# Patient Record
Sex: Male | Born: 1960
Health system: Southern US, Community
[De-identification: ages and names within clinical notes are randomized; demographics above are authoritative.]

## PROBLEM LIST (undated history)

## (undated) ENCOUNTER — Ambulatory Visit: Admission: EM | Payer: Medicare HMO

## (undated) DIAGNOSIS — E1169 Type 2 diabetes mellitus with other specified complication: Secondary | ICD-10-CM

## (undated) DIAGNOSIS — E785 Hyperlipidemia, unspecified: Secondary | ICD-10-CM

## (undated) DIAGNOSIS — N183 Chronic kidney disease, stage 3 unspecified: Secondary | ICD-10-CM

## (undated) DIAGNOSIS — I272 Pulmonary hypertension, unspecified: Secondary | ICD-10-CM

## (undated) DIAGNOSIS — I350 Nonrheumatic aortic (valve) stenosis: Secondary | ICD-10-CM

## (undated) DIAGNOSIS — I10 Essential (primary) hypertension: Secondary | ICD-10-CM

## (undated) DIAGNOSIS — I5032 Chronic diastolic (congestive) heart failure: Secondary | ICD-10-CM

## (undated) DIAGNOSIS — H9190 Unspecified hearing loss, unspecified ear: Secondary | ICD-10-CM

## (undated) DIAGNOSIS — I517 Cardiomegaly: Secondary | ICD-10-CM

## (undated) DIAGNOSIS — I447 Left bundle-branch block, unspecified: Secondary | ICD-10-CM

## (undated) DIAGNOSIS — I509 Heart failure, unspecified: Secondary | ICD-10-CM

## (undated) DIAGNOSIS — G4730 Sleep apnea, unspecified: Secondary | ICD-10-CM

## (undated) DIAGNOSIS — R942 Abnormal results of pulmonary function studies: Secondary | ICD-10-CM

## (undated) DIAGNOSIS — IMO0001 Reserved for inherently not codable concepts without codable children: Secondary | ICD-10-CM

## (undated) DIAGNOSIS — E119 Type 2 diabetes mellitus without complications: Secondary | ICD-10-CM

## (undated) DIAGNOSIS — E669 Obesity, unspecified: Secondary | ICD-10-CM

## (undated) HISTORY — DX: Left bundle-branch block, unspecified: I44.7

## (undated) HISTORY — DX: Morbid (severe) obesity due to excess calories: E66.01

## (undated) HISTORY — DX: Heart failure, unspecified: I50.9

## (undated) HISTORY — DX: Type 2 diabetes mellitus without complications: E11.9

## (undated) HISTORY — DX: Type 2 diabetes mellitus with other specified complication: E66.9

## (undated) HISTORY — DX: Pulmonary hypertension, unspecified: I27.20

## (undated) HISTORY — DX: Hyperlipidemia, unspecified: E78.5

## (undated) HISTORY — PX: TRACHEOSTOMY: SUR1362

## (undated) HISTORY — DX: Nonrheumatic aortic (valve) stenosis: I35.0

## (undated) HISTORY — DX: Chronic kidney disease, stage 3 unspecified: N18.30

## (undated) HISTORY — DX: Type 2 diabetes mellitus with other specified complication: E11.69

## (undated) HISTORY — DX: Abnormal results of pulmonary function studies: R94.2

## (undated) HISTORY — DX: Cardiomegaly: I51.7

## (undated) HISTORY — PX: BACK SURGERY: SHX140

## (undated) HISTORY — DX: Chronic diastolic (congestive) heart failure: I50.32

---

## 1999-03-31 ENCOUNTER — Encounter (INDEPENDENT_AMBULATORY_CARE_PROVIDER_SITE_OTHER): Payer: Self-pay

## 1999-03-31 ENCOUNTER — Encounter: Payer: Self-pay | Admitting: Specialist

## 1999-04-01 ENCOUNTER — Inpatient Hospital Stay (HOSPITAL_COMMUNITY): Admission: EM | Admit: 1999-04-01 | Discharge: 1999-04-02 | Payer: Self-pay | Admitting: Specialist

## 2000-06-07 ENCOUNTER — Emergency Department (HOSPITAL_COMMUNITY): Admission: EM | Admit: 2000-06-07 | Discharge: 2000-06-07 | Payer: Self-pay | Admitting: Emergency Medicine

## 2000-06-07 ENCOUNTER — Encounter: Payer: Self-pay | Admitting: Emergency Medicine

## 2000-11-11 ENCOUNTER — Emergency Department (HOSPITAL_COMMUNITY): Admission: EM | Admit: 2000-11-11 | Discharge: 2000-11-11 | Payer: Self-pay | Admitting: Emergency Medicine

## 2000-11-11 ENCOUNTER — Encounter: Payer: Self-pay | Admitting: Emergency Medicine

## 2000-12-15 ENCOUNTER — Encounter (INDEPENDENT_AMBULATORY_CARE_PROVIDER_SITE_OTHER): Payer: Self-pay | Admitting: Specialist

## 2000-12-16 ENCOUNTER — Inpatient Hospital Stay (HOSPITAL_COMMUNITY): Admission: RE | Admit: 2000-12-16 | Discharge: 2000-12-17 | Payer: Self-pay | Admitting: Otolaryngology

## 2001-02-17 ENCOUNTER — Ambulatory Visit (HOSPITAL_BASED_OUTPATIENT_CLINIC_OR_DEPARTMENT_OTHER): Admission: RE | Admit: 2001-02-17 | Discharge: 2001-02-17 | Payer: Self-pay | Admitting: Otolaryngology

## 2001-12-12 ENCOUNTER — Emergency Department (HOSPITAL_COMMUNITY): Admission: EM | Admit: 2001-12-12 | Discharge: 2001-12-12 | Payer: Self-pay | Admitting: Emergency Medicine

## 2002-03-02 ENCOUNTER — Emergency Department (HOSPITAL_COMMUNITY): Admission: EM | Admit: 2002-03-02 | Discharge: 2002-03-02 | Payer: Self-pay | Admitting: *Deleted

## 2002-12-04 ENCOUNTER — Encounter: Admission: RE | Admit: 2002-12-04 | Discharge: 2002-12-04 | Payer: Self-pay | Admitting: Internal Medicine

## 2002-12-14 ENCOUNTER — Ambulatory Visit: Admission: RE | Admit: 2002-12-14 | Discharge: 2002-12-14 | Payer: Self-pay | Admitting: Internal Medicine

## 2002-12-18 ENCOUNTER — Encounter: Admission: RE | Admit: 2002-12-18 | Discharge: 2002-12-18 | Payer: Self-pay | Admitting: Internal Medicine

## 2003-01-01 ENCOUNTER — Encounter: Admission: RE | Admit: 2003-01-01 | Discharge: 2003-01-01 | Payer: Self-pay | Admitting: Internal Medicine

## 2003-01-15 ENCOUNTER — Encounter: Admission: RE | Admit: 2003-01-15 | Discharge: 2003-01-15 | Payer: Self-pay | Admitting: Internal Medicine

## 2003-01-22 ENCOUNTER — Encounter: Admission: RE | Admit: 2003-01-22 | Discharge: 2003-01-22 | Payer: Self-pay | Admitting: Internal Medicine

## 2003-01-24 ENCOUNTER — Ambulatory Visit (HOSPITAL_BASED_OUTPATIENT_CLINIC_OR_DEPARTMENT_OTHER): Admission: RE | Admit: 2003-01-24 | Discharge: 2003-01-24 | Payer: Self-pay | Admitting: Otolaryngology

## 2003-02-05 ENCOUNTER — Encounter: Admission: RE | Admit: 2003-02-05 | Discharge: 2003-02-05 | Payer: Self-pay | Admitting: Internal Medicine

## 2003-02-06 ENCOUNTER — Ambulatory Visit (HOSPITAL_COMMUNITY): Admission: RE | Admit: 2003-02-06 | Discharge: 2003-02-06 | Payer: Self-pay | Admitting: Otolaryngology

## 2003-02-06 ENCOUNTER — Encounter: Payer: Self-pay | Admitting: Otolaryngology

## 2003-02-14 ENCOUNTER — Inpatient Hospital Stay (HOSPITAL_COMMUNITY): Admission: RE | Admit: 2003-02-14 | Discharge: 2003-02-23 | Payer: Self-pay | Admitting: Otolaryngology

## 2003-02-15 ENCOUNTER — Encounter: Payer: Self-pay | Admitting: Otolaryngology

## 2003-02-18 ENCOUNTER — Encounter: Payer: Self-pay | Admitting: Otolaryngology

## 2003-02-21 ENCOUNTER — Encounter: Payer: Self-pay | Admitting: General Surgery

## 2004-09-30 ENCOUNTER — Emergency Department (HOSPITAL_COMMUNITY): Admission: EM | Admit: 2004-09-30 | Discharge: 2004-09-30 | Payer: Self-pay | Admitting: Emergency Medicine

## 2008-01-07 ENCOUNTER — Inpatient Hospital Stay (HOSPITAL_COMMUNITY): Admission: EM | Admit: 2008-01-07 | Discharge: 2008-01-10 | Payer: Self-pay | Admitting: Emergency Medicine

## 2010-10-22 DIAGNOSIS — L03019 Cellulitis of unspecified finger: Secondary | ICD-10-CM | POA: Insufficient documentation

## 2010-11-03 DIAGNOSIS — L84 Corns and callosities: Secondary | ICD-10-CM | POA: Insufficient documentation

## 2011-01-13 NOTE — Consult Note (Signed)
Mark [name], ACHOR             ACCOUNT NO.:  [id number]   MEDICAL RECORD NO.:  [id number]          PATIENT TYPE:  INP   LOCATION:  6711                         FACILITY:  [facility]   PHYSICIAN:  Maree Krabbe, M.D.DATE OF BIRTH:  [birth year]   DATE OF CONSULTATION:  01/07/2008  DATE OF DISCHARGE:                                 CONSULTATION   CHIEF COMPLAINT:  Acute renal failure.   HISTORY OF PRESENT ILLNESS:  This is a 50 year old African American male  with a history of hypertension and severe obstructive sleep apnea status  post elective trach placement and questionable prediabetes.  He  presented to the ED with a 5-day history of watery diarrhea.  In ED, he  is found to have a creatinine of 13.3 with a BUN of 128, so we are  consulted for acute renal failure.  He denies any vomiting, blood or  mucous in his stools, shortness of breadth, or chest pain.  He has had  very minimal p.o. intake over the past 5 days.  No NSAID use, but has  been taking ARBs regularly along with hydrochlorothiazide.  No history  of kidney disease.  He has noticed some decreased urinary output.  Baseline creatinine was 1.0 in April 2003.   PAST MEDICAL HISTORY:  1. Hypertension.  2. Severe obstructive sleep apnea status post elective tracheostomy in      2004.  3. Questionable prediabetes.  4. Hearing loss, which is congenital on his left ear.  5. History of ruptured lumbar disk status post surgery in 2006.   HOME MEDICATIONS:  1. Hydrochlorothiazide 25 mg daily.  2. Coreg 20 mg daily.  3. Azor 10/40 daily.   SOCIAL HISTORY:  He lives in [place] with his wife.  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  He quit smoking and quit drinking about 5 years ago.  No drug use.   FAMILY HISTORY:  No kidney problems.  Both mother and father are alive,  and both of them are relatively healthy.  No diabetes or hypertension  runs in his family.   REVIEW OF SYSTEMS:  See HPI.  GENERAL: He had some subjective  fevers and  some body aches.  CARDIOPULMONARY: Denies chest pain, shortness of  breadth, or dyspnea on exertion.  GI: No melena, no nausea, or vomiting.  EXTREMITIES: He does endorse some pain over his right big toes, denies  any history of gout.   PHYSICAL EXAMINATION:  VITAL SIGNS: Temperature 98.5, pulse rate of 80  to 82, respiratory rate 20 to 26, blood pressure is 88-108/46-50, oxygen  saturation is 95% room air.  GENERAL: He is sitting up in bed.  He is in no acute distress.  He is  very pleasant.  HEENT: Sclerae are clear.  He does have dry mucous membranes.  He has a  trach, which is clean, dry and intact, and a hearing aid in his left  ear.  CARDIOVASCULAR: Regular rate and rhythm.  Normal S1 and S2.  LUNGS: Clear to auscultation bilaterally.  No increased work of  breathing.  EXTREMITIES:  No edema.  NEURO:  He is alert, and oriented x3.  Grossly intact.  No asterixis.   LABORATORY DATA:  White count 7.6, hemoglobin 12.6, hematocrit of 37,  and platelets 242.  Sodium of 137, creatinine of 4.7, bicarb is 11, BUN  28, creatinine is 13.3.   No chest x-rays has been done yet.  UA and blood cultures are pending.   ASSESSMENT/PLAN:  This is a 50 year old African American male with a  history of hypertension, obstructive sleep apnea with a 5-day history of  diarrhea.  Now with acute renal failure.  1. Acute renal failure.  This time, it is most likely prerenal, as he      has no history of kidney disease, along with this significant      volume depletion over the last several days.  We will check his      FENa, but I suspect it will be less than 1.  Acute renal failure is      easily explained with his hypovolemia and ARB use.  He denies      NSAIDs.  I agreed with IV fluid hydration with bicarb as he is      acidotic.  We will monitor strict I's and O's as we do not know if      he is oliguric at this point.  Potassium and electrolytes are okay      for now, so no indications  for emergent dialysis that maybe      necessary if his creatinine does not improve or electrolytes, the      urinary out put worsen.  I agree with the renal ultrasound to rule      out hydronephrosis; although, it is less likely postrenal failure.      No signs of pulmonary edema at this time.  We will take a renal      panel for calcium and phosphorus.  2. Diarrhea.  Appears to be consistent with infectious etiology.      Stool cultures pending for primary team, symptoms are resolving.  3. Hypertension.  He is currently hypotensive.  We will hold all      antihypertensive, this should improve with IV hydration.  We will      not restart his ARB until after he returns to his baseline      creatinine.  4. Questionable prediabetes.  Would monitor his CBGs, per his primary      team.  5. Prophylaxis.  He is on Protonix.  6. Disposition, for primary team, hopefully he has reversible acute      renal failure, which will resolve with fluid hydration.      Ruthe Mannan, M.D.  Electronically Signed      Maree Krabbe, M.D.  Electronically Signed    TA/MEDQ  D:  01/07/2008  T:  01/08/2008  Job:  [id number]

## 2011-01-13 NOTE — H&P (Signed)
NAMEAD, GUTTMAN NO.:  [id number]   MEDICAL RECORD NO.:  [id number]          PATIENT TYPE:  EMS   LOCATION:  MAJO                         FACILITY:  [facility]   PHYSICIAN:  Elliot Cousin, M.D.    DATE OF BIRTH:  1960-10-11   DATE OF ADMISSION:  01/07/2008  DATE OF DISCHARGE:                              HISTORY & PHYSICAL   PRIMARY CARE PHYSICIAN:  Robyn N. Allyne Gee, M.D.   CHIEF COMPLAINT:  Diarrhea, headache and body aches.   HISTORY OF PRESENT ILLNESS:  The patient is a 50 year old man with a  past medical history significant for obstructive sleep apnea,  hypertension and possibly prediabetes who presents to the emergency  department with a chief complaint primarily of diarrhea and body aches.  Secondarily he has had a global headache that has been intermittent over  the past few days without associated blurred vision, double vision or  stiff neck.  The patient states that approximately 6 nights ago he ate  out at a [employer].  Two days later he developed diffuse watery  and loose diarrhea.  Over the 3 days following he estimates that he has  had at least 15 stools daily.  Over the last 48 hours the frequency and  the amount of stools have subsided but not completely resolved.  His  last bowel movement was this morning and it was small in the amount and  loose.  The patient denies associated black tarry stools or bright red  blood per rectum.  He also denies nausea, vomiting and abdominal pain.  His wife who is present states that she and her son did not eat at the  restaurant and have not been sick.  The patient denies any known sick  contacts.  He denies painful urination.  He says that his urine output  has been normal.  He has had subjective fever and chills.  Upon  further questioning he takes occasional Goody powders approximately 2  weekly but no chronic daily NSAID use.  He denies alcohol, tobacco and  illicit drug use.  When asked about  confusion, his wife states that the  patient has not been confused.   During the evaluation in the emergency department the patient is found  to be hypotensive with a blood pressure of 88/50.  His lab data are  significant for a BUN of 128, creatinine of 13.3 and CO2 of 11.  The  patient will be admitted for further evaluation and management.  Of  note, the patient has no known history of chronic renal disease or acute  renal insufficiency in the past.   PAST MEDICAL HISTORY:  1. Hypertension.  2. Severe obstructive sleep apnea, status post tracheostomy in June of      2004 by Dr. Pollyann Kennedy.  3. Congenital partial hearing loss  (the patient wears a hearing aid).  4. Questionable history of diabetes.  5. Status post lumbar surgery for a ruptured disk at the lower lumbar      spine in 2000.   MEDICATIONS:  1. Azor 10 mg daily.  2. Hydrochlorothiazide 25 mg daily.  3. Coreg CR 20 mg daily at bedtime.   ALLERGIES:  No known drug allergies.   FAMILY HISTORY:  The patient's mother is 57 years of age and has a  history of congenital heart disease.  His father is 6 years of age and  has no known chronic medical conditions.   SOCIAL HISTORY:  The patient is married.  [place]  He has one son.  He is employed as a  Banker.  He stopped smoking approximately 5 years ago.  He  stopped drinking as well 5 years ago.  He denies illicit drug use.  He  is a Engineer, agricultural.   REVIEW OF SYSTEMS:  The patient's review of systems is positive for  diffuse body aches with no complaints of generalized edema.  The  patient's review of systems is also positive for pain over the right  first metatarsal region.  The patient denies trauma to any of his  extremities.  Specifically, the patient's review of systems is negative  for altered mental status, visual changes, stiff neck, chest pain,  shortness of breath, abdominal pain, painful  urination.   PHYSICAL EXAMINATION:  VITAL SIGNS:  Temperature 98.5, blood pressure  88/50, pulse 80, respiratory rate 26, oxygen saturation 95% on room air.  GENERAL:  The patient is a pleasant, alert 50 year old [place]  man who is currently sitting up in bed in no acute distress although he  does appear ill.  HEENT:  Head is normocephalic nontraumatic.  Pupils equal, round,  reactive to light.  Extraocular muscles are intact.  Conjunctivae are  clear.  Sclerae are white.  There is a hearing aid in the left ear,  tympanic membranes not examined.  Nasal mucosa is dry.  No sinus  tenderness.  Oropharynx reveals mildly dry mucous membranes.  No  posterior exudates or erythema.  NECK:  There is a tracheostomy in place that is clean and dry without  any evidence of inflammation, erythema or drainage.  His neck is supple.  No adenopathy and no obvious thyromegaly.  LUNGS:  Decreased breath sounds in the bases, otherwise clear.  HEART:  S1-S2 with no murmurs, rubs or gallops.  ABDOMEN:  Obese, positive bowel sounds, soft, nontender, nondistended.  No hepatosplenomegaly, no masses palpated.  GU:  Deferred.  RECTAL:  Deferred.  EXTREMITIES:  Pedal pulses are palpable bilaterally.  No pretibial edema  and no pedal edema.  There is a small area of tenderness over the right  first metatarsal region without erythema and only a scant amount of  edema.  No open plantar lesions. Mild diffuse tenderness over the large  proximal and distal extremities; no increase in warmth or erythema.  NEUROLOGIC:  The patient is alert and oriented x3.  Cranial nerves II-  XII are intact.  Strength is 5/5 throughout.  Sensation is intact.  No  asterixis.   ADMISSION LABORATORIES:  CK 318, CK-MB 8.0, relative index 2.5,  influenza A and B virus antigens both negative.  WBC 7.6, hemoglobin  12.6, platelets 242, CO2 11, ionized calcium 1.01, hemoglobin 12.6,  sodium 137, potassium 4.7, chloride 114, glucose  120, BUN 128,  creatinine 13.3.   ASSESSMENT:  1. Renal insufficiency, probably acute renal failure.  The patient's      BUN is 128 and his creatinine is 13.3.  In comparison his      creatinine was 1.0 in April of 2003.  The patient gives no  history      of chronic kidney disease.  He has had no prior history of acute      renal failure.  He does not appear to abuse NSAIDs.  Given the      overall scenario, the patient's acute renal failure appears to be      secondary to volume depletion from his diarrheal illness coupled      with angiotensin receptor blocker treatment.  2. Acute gastroenteritis/diarrheal illness/associated myalgias.  The      patient's symptoms started after eating out at a [employer]      in town.  There have been no known sick contacts.  Currently he is      afebrile and according to the patient the extent of diarrhea has      subsided.  His white blood cell count is currently within normal      limits. His CK is mildly elevated.  3. Hypotension.  The patient does not appear to be septic, more than      likely the hypotension is secondary to hypovolemia.  4. Metabolic acidosis.  The patient's bicarbonate is 11.  More than      likely the metabolic acidosis is secondary to renal failure.  5. Mild anemia.  The patient's hemoglobin is 12.6. The anemia is      probably secondary to acute renal failure.   PLAN:  1. The patient will be admitted for further evaluation and management.  2. He has already received 1 liter of normal saline in the emergency      department.  We will continue volume repletion with half normal      saline with bicarbonate added.  3. Will discontinue his antihypertensive medications, particularly      Azor which includes an ARB.  4. Supportive care and pain management.  5. For further evaluation will check an urinalysis, urine sodium and      urine creatinine.  We will also assess the patient further with a      renal ultrasound.  6.  Will check stool studies to assess for infection.  7. Strict I's and O's and daily weights.  8. Will consult the nephrology service.      Elliot Cousin, M.D.  Electronically Signed     DF/MEDQ  D:  01/07/2008  T:  01/07/2008  Job:  [id number]   cc:   Candyce Churn. Allyne Gee, M.D.

## 2011-01-13 NOTE — Discharge Summary (Signed)
Mark [name], Mark [name]             ACCOUNT NO.:  [id number]   MEDICAL RECORD NO.:  [id number]          PATIENT TYPE:  INP   LOCATION:  6711                         FACILITY:  [facility]   PHYSICIAN:  Elliot Cousin, M.D.    DATE OF BIRTH:  12/15/1960   DATE OF ADMISSION:  01/07/2008  DATE OF DISCHARGE:  01/10/2008                               DISCHARGE SUMMARY   DISCHARGE DIAGNOSES:  1. Acute renal failure secondary to volume depletion in the setting of      angiotensin receptor blocker therapy.  2. Hypotension secondary to hypovolemia.  3. Diarrheal illness with myalgias.  4. Mild anemia.  5. Mild peripheral edema.  6. History of hypertension.   DISCHARGE MEDICATIONS:  1. Stop Azor.  2. Hydrochlorothiazide 25 mg daily.  3. Potassium chloride 20 mEq half a tablet daily.  4. Coreg CR 20 mg daily (at bedtime).  5. Multivitamin with iron.   CONSULTATIONS:  Primitivo Gauze, MD   PROCEDURE PERFORMED:  Renal ultrasound results were negative for acute  abnormalities; no hydronephrosis.  Right kidney measures 14.8 cm and  left kidney measures 13.4 cm.   HISTORY OF PRESENT ILLNESS:  The patient is a 50 year old man with a  past medical history significant for hypertension and severe obstructive  sleep apnea, who presented to the emergency department on Jan 07, 2008,  with a chief complaint of diarrhea, body aches and generalized ill  feeling.  When the patient was evaluated in the emergency department, he  was noted to be hypotensive with a blood pressure of 88/50.  His lab  data were significant for a BUN of 128 and a creatinine of 13.3.  The  patient was therefore admitted for further evaluation and management.   For additional details, please see the dictated history and physical.   HOSPITAL COURSE:  1. ACUTE RENAL FAILURE SECONDARY TO VOLUME  DEPLETION AND ARB THERAPY.      The patient had no prior history of acute renal disease or chronic      kidney disease.  Upon questioning,  he had had at least 15 watery      and loose stools for at least 3-4 days.  The patient continued to      take his antihypertensive medications which included an ARB.  For      treatment, he was started on volume repletion with half normal      saline with bicarbonate added.  The Nephrology was consulted by the      emergency department physician.  Dr. Briant Cedar provided the      consultation and agreed with medical management. The patient's      renal function was followed closely.  For further evaluation, lab      studies and a renal ultrasound were ordered.  The patient's      urinalysis revealed 30 of protein, small bilirubin, small blood and      no nitrites or leukocytes.  His CK was 318 and his CK-MB was 8.0.      The random urine sodium was 31 and the urine osmolality was 387.  The total urine protein was 100.  His TSH was within normal limits      at 0.678. The renal ultrasound was essentially negative.   After several days of IV fluid volume repletion, the patient's renal  function progressively improved.  The IV fluids were discontinued  yesterday.  The patient's BUN is currently 31 and his creatinine is  currently 1.44.  The patient is symptomatically improved.   1. HYPOTENSION.  As stated above, the patient's systolic blood      pressure was 88 during the initial hospital assessment.  The      hypotension was felt to be secondary to hypovolemia and not sepsis      given that the patient was afebrile and his white blood cell count      was within normal limits.  After volume repletion, his blood      pressures improved.  The Coreg and hydrochlorothiazide were      withheld during the first 2 days of the hospitalization.  Once his      blood pressure improved, the Coreg was started at half the dose.      Now that the patient's blood pressure is much better, he was      advised to restart the Coreg at the usual home dose and also to      restart hydrochlorothiazide  following hospital discharge.  As of      today, the patient's blood pressure is 131/80.   1. DIARRHEAL ILLNESS WITH MYALGIAS.  The patient did not complain of      or experience any diarrhea during the hospital course.  Stool      studies were ordered, however, it appears that the nursing staff      did not obtain any stool specimens for the studies.  The patient      did have semi-formed bowel movements during the hospitalization,      numbering one daily.  The patient's myalgias also resolved.  The      myalgias were thought to be secondary to the previous diarrheal      illness and azotemia from the acute renal failure.  His CK was      assessed and was only mildly elevated.   1. ANEMIA.  The patient's hemoglobin was 12.6 at the time of the      initial hospital assessment and it fell slowly to 11.2.  Iron      studies were ordered and revealed a total iron of 42, TIBC of 227,      and percent saturation of 19.  His ferritin was within normal      limits at 181.  The anemia was felt to be secondary to acute renal      failure.  The patient may need an outpatient colonoscopy if his      hemoglobin does not normalize completely.   1. PERIPHERAL EDEMA.  Following volume repletion, the patient      developed pedal edema.  The IV fluids were discontinued and the      patient was advised to restart hydrochlorothiazide.  The patient      was advised to keep his legs elevated at rest.      Elliot Cousin, M.D.  Electronically Signed     DF/MEDQ  D:  01/10/2008  T:  01/11/2008  Job:  [id number]   cc:   Candyce Churn. Allyne Gee, M.D.

## 2011-01-16 ENCOUNTER — Encounter (HOSPITAL_BASED_OUTPATIENT_CLINIC_OR_DEPARTMENT_OTHER)
Admission: RE | Admit: 2011-01-16 | Discharge: 2011-01-16 | Disposition: A | Discharge status: Home / Self Care | Payer: Private Health Insurance - Indemnity | Source: Ambulatory Visit | Attending: Otolaryngology | Admitting: Otolaryngology

## 2011-01-16 LAB — BASIC METABOLIC PANEL
BUN: 16 mg/dL (ref 6–23)
Calcium: 9.5 mg/dL (ref 8.4–10.5)
Creatinine, Ser: 1.12 mg/dL (ref 0.4–1.5)
GFR calc non Af Amer: >60 mL/min (ref >60)
Glucose, Bld: 90 mg/dL (ref 70–99)

## 2011-01-16 NOTE — Op Note (Signed)
[place]  Patient:    [name], [name]                    MRN: [id number] Proc. Date: 12/15/00 Adm. Date:  [id number] Attending:  Serena Colonel H                           Operative Report  PREOPERATIVE DIAGNOSIS:  Severe snoring and suspected obstructive sleep apnea.  POSTOPERATIVE DIAGNOSIS:  Severe snoring and suspected obstructive sleep apnea.  OPERATION PERFORMED:  Uvulopalatopharyngoplasty.  SURGEON:  Jefry H. Pollyann Kennedy, M.D.  ANESTHESIA:  General endotracheal.  COMPLICATIONS:  None.  ESTIMATED BLOOD LOSS:  20 cc.  FINDINGS:  Severe thickening and elongation of the soft palate and uvula with severe thickening of the musculature of the pharynx and soft tissues lateral to the pharyngeal space.  INDICATIONS FOR PROCEDURE:  The patient is a 50 year old with a 2-year history of severe snoring and sleep apnea by history.   He has no problems with nasal obstruction.  Office exam revealed that he was able to snore while awake and talking.  The most marked finding on office exam while sitting up was the severe elongation of the soft palate and uvula.  The risks, benefits, alternatives and complications of the procedure were explained to the patient, who seemed to understand and agreed to surgery.  DESCRIPTION OF PROCEDURE:  The patient was taken to the operating room and placed on the operating table in supine position.  Following induction of general endotracheal anesthesia, the patient was turned 90 degrees.  The patient was draped in standard fashion.  The Crowe-Davis mouth gag was inserted into the oral cavity and used to retract the tongue and mandible and attached to the Mayo stand.  Inspection of the palate and pharynx revealed the above-mentioned findings.  Electrocautery unit was used to mark the proposed resection sites on the soft palate.  Adequate soft palate was remaining for velopharyngeal closure.  The electrocautery was used  to incise the mucosa and through the thick submucosal and muscularis layer.  The muscularis uvulae was extremely thickened, approximately 1.5 cm in diameter.  The posterior mucosal cuts were then made as well.  The posterior mucosa was brought forward and laid up against the anterior mucosa and it was felt that excess tissue could be resected.  A total of approximately 4.5 cm was resected from the soft palate.  After the re-resection there was felt to be adequate soft palate remaining and significant improvement of the airway anatomy.  The mucosa was reapproximated using interrupted inverted 4-0 Vicryl suture.  Electrocautery was used to stop any bleeding.  The pharynx was suctioned of blood and secretions and irrigated with saline solution.  An orogastric tube was used to aspirate the contents of the stomach.  The patient was then awakened from anesthesia, extubated and transferred to recovery in stable condition. DD:  12/16/00 TD:  12/16/00 Job: [id number]

## 2011-01-16 NOTE — Op Note (Signed)
   NAME:  Mark [name], Mark [name]                       ACCOUNT NO.:  [id number]   MEDICAL RECORD NO.:  [id number]                   PATIENT TYPE:  INP   LOCATION:  2550                                 FACILITY:  [facility]   PHYSICIAN:  Jefry H. Pollyann Kennedy, M.D.                DATE OF BIRTH:  August 30, 1961   DATE OF PROCEDURE:  02/14/2003  DATE OF DISCHARGE:                                 OPERATIVE REPORT   PREOPERATIVE DIAGNOSIS:  Severe obstructive sleep apnea syndrome.   POSTOPERATIVE DIAGNOSIS:  Severe obstructive sleep apnea syndrome.   OPERATION PERFORMED:  Tracheostomy.   SURGEON:  Jefry H. Pollyann Kennedy, M.D.   ANESTHESIA:  General endotracheal.   COMPLICATIONS:  None.   ESTIMATED BLOOD LOSS:  10mL.   INDICATIONS FOR PROCEDURE:  This is a 50 year old with a history of severe  obstructive sleep apnea syndrome who has not been able to tolerate CPAP and  has undergone uvulopalatopharyngoplasty without any significant improvement  of symptoms.  The risks, benefits, alternatives and complications of the  procedure were explained to the patient, who seemed to understand and agreed  to surgery.   DESCRIPTION OF PROCEDURE:  The patient was taken to the operating room and  placed on the operating table in the supine position.  Following induction  of general endotracheal anesthesia, the neck was prepped and draped in  standard fashion.  A vertical incision was outlined above the sternal notch  and was created with electrocautery.  Electrocautery dissection was used to  go through the superficial fascia and the diastasis of the strap muscles.  The straps were reflected laterally.  The thyroid isthmus was identified and  was divided using electrocautery.  A vein was ligated between clamps and  divided.  The thyroid lobes were reflected laterally.  The upper trachea was  cleaned of overlying fascia.  A tracheotomy was created between the second  and third ring with the lower tracheal ring flap that was  secured to the  cervical  skin with a 2-0 chromic suture.  Using direct visualization, the  endotracheal tube was removed and a #8 cuffed Shiley tracheostomy tube was  placed without difficulty.  The trach tube was secured to the skin using  nylon suture and Velcro ties.  The patient was awakened from anesthesia,  transferred to recovery in stable condition.                                                Jefry H. Pollyann Kennedy, M.D.    JHR/MEDQ  D:  02/14/2003  T:  02/14/2003  Job:  [id number]

## 2011-01-16 NOTE — Discharge Summary (Signed)
   NAME:  Mark [name], URBANEK                       ACCOUNT NO.:  [id number]   MEDICAL RECORD NO.:  [id number]                   PATIENT TYPE:  INP   LOCATION:  5713                                 FACILITY:  [facility]   PHYSICIAN:  Jefry H. Pollyann Kennedy, M.D.                DATE OF BIRTH:  1960/09/09   DATE OF ADMISSION:  02/14/2003  DATE OF DISCHARGE:  02/23/2003                                 DISCHARGE SUMMARY   ADMISSION DIAGNOSIS:  Severe obstructive sleep apnea syndrome.   POSTOPERATIVE DIAGNOSES:  1. Severe obstructive sleep apnea syndrome.  2. Status post tracheostomy.  3. Status post pneumonia.   HISTORY:  This is a 50 year old who was admitted to the hospital to undergo  elective tracheostomy for severe obstructive sleep apnea syndrome.  Prior to  surgery, he was found to be unable to stay awake for more than about five  minutes and was unable to continue with a conversation in the office.  He  had undergone uvulopalatopharyngoplasty the year before and did not have a  significant improvement of his symptoms.  Sleep study had been performed and  revealed very high respiratory disturbance index in the range of 80-90.  He  underwent surgery uneventfully, was transferred to the intensive care unit  immediately postoperatively.  He was transferred back to the surgical  nursing floor on postoperative day one in satisfactory condition.  Speech  pathology was consulted for a Passe Muir speaking valve which he tolerated  well.  He developed what appeared to be pneumonia which was treated with  Unison and chest physical therapy.  He had a history of hypertension but his  blood pressures remained within normal range while in the hospital.  The  medicine team was consulted for assistance with care in medical problems.  The remainder of his hospital stay was basically uneventful.  He was taught  how to care for his tracheostomy and use his speaking valve.  His diet was  advanced to regular.  His  tracheostomy was cuffed and initially he was  changed to a non-cuff tube on postoperative day five.  He was discharged  home in satisfactory condition and instructed to follow up with the medicine  doctor and with myself following discharge.  Arrangements were made for him  to have tracheostomy suctioning at home.                                               Jefry H. Pollyann Kennedy, M.D.    JHR/MEDQ  D:  03/14/2003  T:  03/15/2003  Job:  [id number]

## 2011-01-19 ENCOUNTER — Ambulatory Visit (HOSPITAL_BASED_OUTPATIENT_CLINIC_OR_DEPARTMENT_OTHER)
Admission: RE | Admit: 2011-01-19 | Discharge: 2011-01-19 | Disposition: A | Discharge status: Home / Self Care | Payer: Private Health Insurance - Indemnity | Source: Ambulatory Visit | Attending: Otolaryngology | Admitting: Otolaryngology

## 2011-01-19 ENCOUNTER — Other Ambulatory Visit: Payer: Self-pay | Admitting: Otolaryngology

## 2011-01-19 DIAGNOSIS — E119 Type 2 diabetes mellitus without complications: Secondary | ICD-10-CM | POA: Insufficient documentation

## 2011-01-19 DIAGNOSIS — I10 Essential (primary) hypertension: Secondary | ICD-10-CM | POA: Insufficient documentation

## 2011-01-19 DIAGNOSIS — L910 Hypertrophic scar: Secondary | ICD-10-CM | POA: Insufficient documentation

## 2011-01-19 DIAGNOSIS — Z01812 Encounter for preprocedural laboratory examination: Secondary | ICD-10-CM | POA: Insufficient documentation

## 2011-01-19 DIAGNOSIS — Z930 Tracheostomy status: Secondary | ICD-10-CM | POA: Insufficient documentation

## 2011-01-19 DIAGNOSIS — G4730 Sleep apnea, unspecified: Secondary | ICD-10-CM | POA: Insufficient documentation

## 2011-01-19 DIAGNOSIS — Z01810 Encounter for preprocedural cardiovascular examination: Secondary | ICD-10-CM | POA: Insufficient documentation

## 2011-01-19 LAB — GLUCOSE, CAPILLARY: Glucose-Capillary: 83 mg/dL (ref 70–99)

## 2011-01-28 NOTE — Op Note (Signed)
  [name], Mark [name]             ACCOUNT NO.:  [id number]  MEDICAL RECORD NO.:  [id number]            PATIENT TYPE:  LOCATION:                                 FACILITY:  PHYSICIAN:  Gregoria Selvy H. Pollyann Kennedy, MD          DATE OF BIRTH:  DATE OF PROCEDURE:  01/19/2011 DATE OF DISCHARGE:                              OPERATIVE REPORT   PREOPERATIVE DIAGNOSIS:  Neck keloids.  POSTOPERATIVE DIAGNOSIS:  Neck keloids.  PROCEDURE:  Excision of neck keloids.  Local anesthesia was used with intravenous sedation and monitored anesthesia care.  No complications.  Blood loss minimal.  FINDINGS:  Two very large bulky ovoid-shaped keloids just underneath the shield of the tracheostomy.  HISTORY:  A 50 year old with severe sleep apnea had tracheostomy several years ago, has developed these large keloid over the past 6 months or so.  Risks, benefits, alternatives, complications of the procedure were explained to the patient, seemed to understand and agreed to surgery.  PROCEDURE IN DETAILS:  The patient was taken to the operating room, placed on the operating room table in supine position.  Following induction of intravenous sedation, the neck was prepped and draped in standard fashion.  Xylocaine 1% with epinephrine was infiltrated along the base of the keloids bilaterally.  A 15 scalpel was used to incise the skin in an ovoid fashion removing each of the keloids.  The resulting wound was then treated with bipolar cautery for hemostasis and closed in a subcuticular layer using interrupted 4-0 chromic suture and Dermabond on the skin.  The trach was removed at the end of procedure to be able to get good closure and then was replaced.  The patient was then awakened, transferred to recovery in stable condition.     [name] H. Pollyann Kennedy, MD     JHR/MEDQ  D:  01/19/2011  T:  01/19/2011  Job:  [id number]  Electronically Signed by Serena Colonel MD on 01/28/2011 07:47:34 AM

## 2011-05-27 LAB — RENAL FUNCTION PANEL
Albumin: 2.9 — ABNORMAL LOW
Albumin: 3.1 — ABNORMAL LOW
BUN: 113 — ABNORMAL HIGH
BUN: 31 — ABNORMAL HIGH
CO2: 13 — ABNORMAL LOW
CO2: 24
Calcium: 7.9 — ABNORMAL LOW
Calcium: 8 — ABNORMAL LOW
Chloride: 108
Chloride: 111
Creatinine, Ser: 1.44
Creatinine, Ser: 10.38 — ABNORMAL HIGH
Creatinine, Ser: 5.94 — ABNORMAL HIGH
GFR calc Af Amer: 12 — ABNORMAL LOW
GFR calc Af Amer: 42 — ABNORMAL LOW
GFR calc Af Amer: >60
GFR calc non Af Amer: 10 — ABNORMAL LOW
GFR calc non Af Amer: 35 — ABNORMAL LOW
GFR calc non Af Amer: 52 — ABNORMAL LOW
Glucose, Bld: 93
Glucose, Bld: 98
Phosphorus: 2.4
Phosphorus: 8.9 — ABNORMAL HIGH
Potassium: 3.8
Sodium: 137
Sodium: 139
Sodium: 141

## 2011-05-27 LAB — URINALYSIS, ROUTINE W REFLEX MICROSCOPIC
Nitrite: NEGATIVE
Specific Gravity, Urine: 1.019
Urobilinogen, UA: 0.2
pH: 5

## 2011-05-27 LAB — DIFFERENTIAL
Band Neutrophils: 0
Blasts: 0
Lymphocytes Relative: 14
Lymphs Abs: 1.1
Myelocytes: 0
Neutrophils Relative %: 76
Promyelocytes Absolute: 0

## 2011-05-27 LAB — HEPATIC FUNCTION PANEL
ALT: 35
Alkaline Phosphatase: 93
Total Bilirubin: 0.6

## 2011-05-27 LAB — POCT I-STAT, CHEM 8
BUN: 128 — ABNORMAL HIGH
Calcium, Ion: 1.01 — ABNORMAL LOW
HCT: 37 — ABNORMAL LOW
Hemoglobin: 12.6 — ABNORMAL LOW
Sodium: 137
TCO2: 11

## 2011-05-27 LAB — HEMOGLOBIN A1C: Hgb A1c MFr Bld: 6.8 — ABNORMAL HIGH

## 2011-05-27 LAB — CK TOTAL AND CKMB (NOT AT ARMC)
CK, MB: 8 — ABNORMAL HIGH
Total CK: 318 — ABNORMAL HIGH

## 2011-05-27 LAB — CBC
Hemoglobin: 12.6 — ABNORMAL LOW
MCHC: 34
MCV: 85.2
Platelets: 230
RDW: 14.9
RDW: 15
WBC: 6

## 2011-05-27 LAB — URINE CULTURE: Colony Count: NO GROWTH

## 2011-05-27 LAB — URINE MICROSCOPIC-ADD ON

## 2011-05-27 LAB — IRON AND TIBC
Iron: 42
TIBC: 227

## 2011-05-27 LAB — INFLUENZA A+B VIRUS AG-DIRECT(RAPID)
Inflenza A Ag: NEGATIVE
Influenza B Ag: NEGATIVE

## 2011-05-27 LAB — OSMOLALITY, URINE: Osmolality, Ur: 387 — ABNORMAL LOW

## 2011-05-27 LAB — CREATININE, URINE, RANDOM: Creatinine, Urine: 286.8

## 2012-03-21 DIAGNOSIS — IMO0001 Reserved for inherently not codable concepts without codable children: Secondary | ICD-10-CM | POA: Diagnosis not present

## 2012-03-21 DIAGNOSIS — I10 Essential (primary) hypertension: Secondary | ICD-10-CM | POA: Diagnosis not present

## 2012-03-21 DIAGNOSIS — Z9119 Patient's noncompliance with other medical treatment and regimen: Secondary | ICD-10-CM | POA: Diagnosis not present

## 2012-03-21 DIAGNOSIS — Z91199 Patient's noncompliance with other medical treatment and regimen due to unspecified reason: Secondary | ICD-10-CM | POA: Diagnosis not present

## 2012-03-21 DIAGNOSIS — Z79899 Other long term (current) drug therapy: Secondary | ICD-10-CM | POA: Diagnosis not present

## 2012-04-25 DIAGNOSIS — E7800 Pure hypercholesterolemia, unspecified: Secondary | ICD-10-CM | POA: Diagnosis not present

## 2012-04-25 DIAGNOSIS — Z79899 Other long term (current) drug therapy: Secondary | ICD-10-CM | POA: Diagnosis not present

## 2012-04-25 DIAGNOSIS — I10 Essential (primary) hypertension: Secondary | ICD-10-CM | POA: Diagnosis not present

## 2012-06-06 DIAGNOSIS — I10 Essential (primary) hypertension: Secondary | ICD-10-CM | POA: Diagnosis not present

## 2012-06-06 DIAGNOSIS — E7800 Pure hypercholesterolemia, unspecified: Secondary | ICD-10-CM | POA: Diagnosis not present

## 2012-06-06 DIAGNOSIS — E119 Type 2 diabetes mellitus without complications: Secondary | ICD-10-CM | POA: Diagnosis not present

## 2012-07-21 ENCOUNTER — Emergency Department (HOSPITAL_COMMUNITY)
Admission: EM | Admit: 2012-07-21 | Discharge: 2012-07-21 | Disposition: A | Discharge status: Home / Self Care | Payer: Managed Care, Other (non HMO) | Attending: Emergency Medicine | Admitting: Emergency Medicine

## 2012-07-21 ENCOUNTER — Encounter (HOSPITAL_COMMUNITY): Payer: Self-pay | Admitting: Emergency Medicine

## 2012-07-21 ENCOUNTER — Encounter: Payer: Self-pay | Admitting: Internal Medicine

## 2012-07-21 DIAGNOSIS — E119 Type 2 diabetes mellitus without complications: Secondary | ICD-10-CM | POA: Diagnosis not present

## 2012-07-21 DIAGNOSIS — G4730 Sleep apnea, unspecified: Secondary | ICD-10-CM | POA: Insufficient documentation

## 2012-07-21 DIAGNOSIS — I10 Essential (primary) hypertension: Secondary | ICD-10-CM | POA: Diagnosis not present

## 2012-07-21 DIAGNOSIS — K921 Melena: Secondary | ICD-10-CM | POA: Diagnosis not present

## 2012-07-21 DIAGNOSIS — K625 Hemorrhage of anus and rectum: Secondary | ICD-10-CM | POA: Diagnosis not present

## 2012-07-21 HISTORY — DX: Reserved for inherently not codable concepts without codable children: IMO0001

## 2012-07-21 HISTORY — DX: Essential (primary) hypertension: I10

## 2012-07-21 HISTORY — DX: Type 2 diabetes mellitus without complications: E11.9

## 2012-07-21 HISTORY — DX: Unspecified hearing loss, unspecified ear: H91.90

## 2012-07-21 HISTORY — DX: Sleep apnea, unspecified: G47.30

## 2012-07-21 LAB — COMPREHENSIVE METABOLIC PANEL
AST: 20 U/L (ref 0–37)
Albumin: 3.8 g/dL (ref 3.5–5.2)
Alkaline Phosphatase: 104 U/L (ref 39–117)
BUN: 16 mg/dL (ref 6–23)
Chloride: 106 mEq/L (ref 96–112)
Potassium: 3.7 mEq/L (ref 3.5–5.1)
Total Bilirubin: 0.2 mg/dL — ABNORMAL LOW (ref 0.3–1.2)

## 2012-07-21 LAB — OCCULT BLOOD, POC DEVICE: Fecal Occult Bld: POSITIVE

## 2012-07-21 LAB — CBC WITH DIFFERENTIAL/PLATELET
Basophils Relative: 1 % (ref 0–1)
Hemoglobin: 13.4 g/dL (ref 13.0–17.0)
MCHC: 33.4 g/dL (ref 30.0–36.0)
Monocytes Relative: 7 % (ref 3–12)
Neutro Abs: 4.4 10*3/uL (ref 1.7–7.7)
Neutrophils Relative %: 68 % (ref 43–77)
RBC: 4.39 MIL/uL (ref 4.22–5.81)

## 2012-07-21 MED ORDER — DOCUSATE SODIUM 100 MG PO CAPS: 100.0000 mg | ORAL_CAPSULE | Freq: Two times a day (BID) | ORAL | Status: DC

## 2012-07-21 NOTE — ED Provider Notes (Signed)
History     CSN: [id number]  Arrival date & time 07/21/12  5284   First MD Initiated Contact with Patient 07/21/12 [phone]      Chief Complaint  Patient presents with  . Rectal Bleeding    (Consider location/radiation/quality/duration/timing/severity/associated sxs/prior treatment) HPI Comments: Patient with history significant for DM II, HTN, OSA, presents with complaint of rectal bleeding. Patient states that he was constipated last night and had to strain to have a bowel movement. He reports having a bowel movement this morning and had bloody dark red stool that filled toilet. Denies hemmorhoid. Has not had a colonoscopy. See Dr. Allyne Gee. Denies fever or chills. Denies NVD or abdominal pain. Denies taking blood thinners or ASA. Denies personal or family history of colon CA.  The history is provided by the patient. No language interpreter was used.    Past Medical History  Diagnosis Date  . Hypertension   . Diabetes mellitus without complication   . Sleep apnea   . Hearing impaired     Past Surgical History  Procedure Date  . Back surgery   . Tracheostomy     No family history on file.  History  Substance Use Topics  . Smoking status: Never Smoker   . Smokeless tobacco: Not on file  . Alcohol Use: No      Review of Systems  Constitutional: Negative for fever and chills.  Gastrointestinal: Positive for blood in stool and anal bleeding. Negative for nausea, vomiting, abdominal pain and diarrhea.    Allergies  Review of patient's allergies indicates no known allergies.  Home Medications  No current outpatient prescriptions on file.  There were no vitals taken for this visit.  Physical Exam  Nursing note and vitals reviewed. Constitutional: He appears well-developed and well-nourished.  HENT:  Head: Normocephalic and atraumatic.  Mouth/Throat: Oropharynx is clear and moist.  Eyes: Conjunctivae normal and EOM are normal. No scleral icterus.  Neck: Normal  range of motion. Neck supple.  Cardiovascular: Normal rate, regular rhythm and normal heart sounds.   Pulmonary/Chest: Effort normal and breath sounds normal.  Abdominal: Soft. Bowel sounds are normal. He exhibits no mass. There is no tenderness.  Genitourinary: Guaiac positive stool.       No external hemmorhoids or anal fissures appreciated.   Neurological: He is alert.  Skin: Skin is warm and dry.    ED Course  Procedures (including critical care time)  Labs Reviewed  COMPREHENSIVE METABOLIC PANEL - Abnormal; Notable for the following:    Total Bilirubin 0.2 (*)     All other components within normal limits  CBC WITH DIFFERENTIAL  OCCULT BLOOD, POC DEVICE  OCCULT BLOOD X 1 CARD TO LAB, STOOL   Results for orders placed during the hospital encounter of 07/21/12  CBC WITH DIFFERENTIAL      Component Value Range   WBC 6.4  4.0 - 10.5 K/uL   RBC 4.39  4.22 - 5.81 MIL/uL   Hemoglobin 13.4  13.0 - 17.0 g/dL   HCT 40.1  02.7 - 25.3 %   MCV 91.3  78.0 - 100.0 fL   MCH 30.5  26.0 - 34.0 pg   MCHC 33.4  30.0 - 36.0 g/dL   RDW 66.4  40.3 - 47.4 %   Platelets 246  150 - 400 K/uL   Neutrophils Relative 68  43 - 77 %   Neutro Abs 4.4  1.7 - 7.7 K/uL   Lymphocytes Relative 21  12 - 46 %  Lymphs Abs 1.4  0.7 - 4.0 K/uL   Monocytes Relative 7  3 - 12 %   Monocytes Absolute 0.4  0.1 - 1.0 K/uL   Eosinophils Relative 3  0 - 5 %   Eosinophils Absolute 0.2  0.0 - 0.7 K/uL   Basophils Relative 1  0 - 1 %   Basophils Absolute 0.1  0.0 - 0.1 K/uL  COMPREHENSIVE METABOLIC PANEL      Component Value Range   Sodium 143  135 - 145 mEq/L   Potassium 3.7  3.5 - 5.1 mEq/L   Chloride 106  96 - 112 mEq/L   CO2 24  19 - 32 mEq/L   Glucose, Bld 91  70 - 99 mg/dL   BUN 16  6 - 23 mg/dL   Creatinine, Ser 1.61  0.50 - 1.35 mg/dL   Calcium 9.2  8.4 - 09.6 mg/dL   Total Protein 7.2  6.0 - 8.3 g/dL   Albumin 3.8  3.5 - 5.2 g/dL   AST 20  0 - 37 U/L   ALT 12  0 - 53 U/L   Alkaline Phosphatase 104   39 - 117 U/L   Total Bilirubin 0.2 (*) 0.3 - 1.2 mg/dL   GFR calc non Af Amer >90  >90 mL/min   GFR calc Af Amer >90  >90 mL/min  OCCULT BLOOD, POC DEVICE      Component Value Range   Fecal Occult Bld POSITIVE      No results found.   1. Rectal bleeding       MDM  Patient presented with bright red rectal bleeding. CBC and CMP unremarkable for signs of chronic blood loss. Hemoocult positive. Discharged with Rx for stool softeners and referral to GI for colonoscopy. Return precautions given. No red flags for diverticulitis.         Pixie Casino, PA-C 07/21/12 [phone]  Pixie Casino, PA-C 07/21/12 [phone]

## 2012-07-21 NOTE — ED Notes (Signed)
PT. REPORTS BLOODY STOOL THIS MORNING , DENIES ABDOMINAL PAIN OR DIARRHEA , DENIES TAKING ANTICOAGULANTS.

## 2012-07-21 NOTE — ED Notes (Signed)
Pt reports having bloody stools yesterday and this morning.  St's he was straining to have a BM and noticed blood in the toilet after; pt reports it was a lot of blood.  Pt conscious, alert, oriented x4, nad.

## 2012-07-22 NOTE — ED Provider Notes (Signed)
Medical screening examination/treatment/procedure(s) were performed by non-physician practitioner and as supervising physician I was immediately available for consultation/collaboration.  Tobin Chad, MD 07/22/12 [phone]

## 2012-08-11 ENCOUNTER — Encounter: Payer: Self-pay | Admitting: Internal Medicine

## 2012-08-16 ENCOUNTER — Ambulatory Visit: Payer: Medicare Other | Admitting: Internal Medicine

## 2013-03-06 DIAGNOSIS — E119 Type 2 diabetes mellitus without complications: Secondary | ICD-10-CM | POA: Diagnosis not present

## 2013-03-06 DIAGNOSIS — R0989 Other specified symptoms and signs involving the circulatory and respiratory systems: Secondary | ICD-10-CM | POA: Diagnosis not present

## 2013-03-06 DIAGNOSIS — E781 Pure hyperglyceridemia: Secondary | ICD-10-CM | POA: Diagnosis not present

## 2013-03-06 DIAGNOSIS — R0602 Shortness of breath: Secondary | ICD-10-CM | POA: Diagnosis not present

## 2013-03-06 DIAGNOSIS — I10 Essential (primary) hypertension: Secondary | ICD-10-CM | POA: Diagnosis not present

## 2013-03-06 DIAGNOSIS — R0609 Other forms of dyspnea: Secondary | ICD-10-CM | POA: Diagnosis not present

## 2013-03-06 DIAGNOSIS — E7800 Pure hypercholesterolemia, unspecified: Secondary | ICD-10-CM | POA: Diagnosis not present

## 2013-06-05 DIAGNOSIS — E119 Type 2 diabetes mellitus without complications: Secondary | ICD-10-CM | POA: Diagnosis not present

## 2013-06-05 DIAGNOSIS — I10 Essential (primary) hypertension: Secondary | ICD-10-CM | POA: Insufficient documentation

## 2013-06-05 DIAGNOSIS — R5381 Other malaise: Secondary | ICD-10-CM | POA: Diagnosis not present

## 2013-06-05 DIAGNOSIS — E7800 Pure hypercholesterolemia, unspecified: Secondary | ICD-10-CM | POA: Diagnosis not present

## 2013-06-05 DIAGNOSIS — E559 Vitamin D deficiency, unspecified: Secondary | ICD-10-CM | POA: Insufficient documentation

## 2013-06-05 DIAGNOSIS — I119 Hypertensive heart disease without heart failure: Secondary | ICD-10-CM | POA: Insufficient documentation

## 2013-10-16 ENCOUNTER — Ambulatory Visit (HOSPITAL_BASED_OUTPATIENT_CLINIC_OR_DEPARTMENT_OTHER): Payer: Managed Care, Other (non HMO)

## 2013-10-19 ENCOUNTER — Ambulatory Visit (HOSPITAL_BASED_OUTPATIENT_CLINIC_OR_DEPARTMENT_OTHER): Payer: Managed Care, Other (non HMO) | Attending: Otolaryngology | Admitting: Radiology

## 2013-10-19 VITALS — Ht 71.0 in | Wt 256.0 lb

## 2013-10-19 DIAGNOSIS — Z930 Tracheostomy status: Secondary | ICD-10-CM | POA: Insufficient documentation

## 2013-10-19 DIAGNOSIS — G4710 Hypersomnia, unspecified: Secondary | ICD-10-CM | POA: Diagnosis present

## 2013-10-19 DIAGNOSIS — G4733 Obstructive sleep apnea (adult) (pediatric): Secondary | ICD-10-CM | POA: Insufficient documentation

## 2013-10-19 DIAGNOSIS — G4730 Sleep apnea, unspecified: Secondary | ICD-10-CM | POA: Diagnosis present

## 2013-10-21 DIAGNOSIS — G4733 Obstructive sleep apnea (adult) (pediatric): Secondary | ICD-10-CM

## 2013-10-21 NOTE — Sleep Study (Signed)
   NAME: Mark [name] E Wandler DATE OF BIRTH:  05/16/1961 MEDICAL RECORD NUMBER [id number]  LOCATION: [facility]  PHYSICIAN: Hamda Klutts D  DATE OF STUDY: 10/19/2013  SLEEP STUDY TYPE: Nocturnal Polysomnogram               REFERRING PHYSICIAN: Serena Colonelosen, Jefry, MD  INDICATION FOR STUDY: Hypersomnia with sleep apnea  EPWORTH SLEEPINESS SCORE:   2/24 HEIGHT: 5\' 11"  (180.3 cm)  WEIGHT: 256 lb (116.121 kg)    Body mass index is 35.72 kg/(m^2).  NECK SIZE: 18 in.  MEDICATIONS: Charted for review  SLEEP ARCHITECTURE: Total sleep time 312.5 minutes with sleep efficiency 79%. Stage I was 23.2%, stage II 68.8%, stage III absent, REM 8% of total sleep time. Sleep latency 9.5 minutes, REM latency 234 minutes, awake after sleep onset 74 minutes, arousal index 48.4. Bedtime medication: None  RESPIRATORY DATA: Apnea hypopnea index (AHI) 41.5 per hour. 216 total events with 42 obstructive apneas, 174 hypopneas. Non-positional. REM AHI 21.6 per hour. The study was ordered as a diagnostic polysomnogram without CPAP.  OXYGEN DATA: The study was performed with a capped tracheostomy tube as ordered. Moderately loud snoring with oxygen desaturation to a nadir of 76% and mean oxygen saturation through the study of 90.9% on room air.  CARDIAC DATA: Sinus rhythm with PVCs  MOVEMENT/PARASOMNIA: No significant movement disturbance. Bathroom x1  IMPRESSION/ RECOMMENDATION:   1) The study was performed with a capped tracheostomy tube in place, as ordered  2) Severe obstructive sleep apnea/hypopneas syndrome, AHI 41.5 per hour with non-positional in events. REM AHI 21.6 per hour. Moderately loud snoring with oxygen desaturation to a nadir of 76% and mean oxygen saturation through the study of 90.9% on room air.  Signed Jetty Duhamellinton Klover Priestly M.D. Waymon BudgeYOUNG,Elane Peabody D Diplomate, American Board of Sleep Medicine  ELECTRONICALLY SIGNED ON:  10/21/2013, 10:40 AM [facility] PH: [phone]  [phone]   FX: [phone] ACCREDITED BY THE AMERICAN ACADEMY OF SLEEP MEDICINE

## 2013-11-06 DIAGNOSIS — G4733 Obstructive sleep apnea (adult) (pediatric): Secondary | ICD-10-CM | POA: Diagnosis not present

## 2014-01-01 DIAGNOSIS — Z91199 Patient's noncompliance with other medical treatment and regimen due to unspecified reason: Secondary | ICD-10-CM | POA: Diagnosis not present

## 2014-01-01 DIAGNOSIS — Z79899 Other long term (current) drug therapy: Secondary | ICD-10-CM | POA: Diagnosis not present

## 2014-01-01 DIAGNOSIS — E119 Type 2 diabetes mellitus without complications: Secondary | ICD-10-CM | POA: Diagnosis not present

## 2014-01-01 DIAGNOSIS — I10 Essential (primary) hypertension: Secondary | ICD-10-CM | POA: Diagnosis not present

## 2014-01-01 DIAGNOSIS — Z9119 Patient's noncompliance with other medical treatment and regimen: Secondary | ICD-10-CM | POA: Diagnosis not present

## 2014-07-16 DIAGNOSIS — E782 Mixed hyperlipidemia: Secondary | ICD-10-CM | POA: Diagnosis not present

## 2014-07-16 DIAGNOSIS — E1142 Type 2 diabetes mellitus with diabetic polyneuropathy: Secondary | ICD-10-CM | POA: Diagnosis not present

## 2014-07-16 DIAGNOSIS — Z6841 Body Mass Index (BMI) 40.0 and over, adult: Secondary | ICD-10-CM | POA: Diagnosis not present

## 2014-07-16 DIAGNOSIS — E669 Obesity, unspecified: Secondary | ICD-10-CM | POA: Diagnosis not present

## 2014-10-29 DIAGNOSIS — I10 Essential (primary) hypertension: Secondary | ICD-10-CM | POA: Diagnosis not present

## 2014-10-29 DIAGNOSIS — Z6841 Body Mass Index (BMI) 40.0 and over, adult: Secondary | ICD-10-CM | POA: Diagnosis not present

## 2014-10-29 DIAGNOSIS — Z79899 Other long term (current) drug therapy: Secondary | ICD-10-CM | POA: Diagnosis not present

## 2014-10-29 DIAGNOSIS — E1165 Type 2 diabetes mellitus with hyperglycemia: Secondary | ICD-10-CM | POA: Diagnosis not present

## 2015-03-11 DIAGNOSIS — Z79899 Other long term (current) drug therapy: Secondary | ICD-10-CM | POA: Diagnosis not present

## 2015-03-11 DIAGNOSIS — H9190 Unspecified hearing loss, unspecified ear: Secondary | ICD-10-CM | POA: Diagnosis not present

## 2015-03-11 DIAGNOSIS — E1165 Type 2 diabetes mellitus with hyperglycemia: Secondary | ICD-10-CM | POA: Diagnosis not present

## 2015-03-11 DIAGNOSIS — I10 Essential (primary) hypertension: Secondary | ICD-10-CM | POA: Diagnosis not present

## 2015-03-11 DIAGNOSIS — L818 Other specified disorders of pigmentation: Secondary | ICD-10-CM | POA: Diagnosis not present

## 2015-07-15 DIAGNOSIS — E1165 Type 2 diabetes mellitus with hyperglycemia: Secondary | ICD-10-CM | POA: Diagnosis not present

## 2015-07-15 DIAGNOSIS — Z6841 Body Mass Index (BMI) 40.0 and over, adult: Secondary | ICD-10-CM | POA: Diagnosis not present

## 2015-07-15 DIAGNOSIS — H9192 Unspecified hearing loss, left ear: Secondary | ICD-10-CM | POA: Diagnosis not present

## 2015-07-15 DIAGNOSIS — I10 Essential (primary) hypertension: Secondary | ICD-10-CM | POA: Diagnosis not present

## 2015-11-11 DIAGNOSIS — I10 Essential (primary) hypertension: Secondary | ICD-10-CM | POA: Diagnosis not present

## 2015-12-02 DIAGNOSIS — I10 Essential (primary) hypertension: Secondary | ICD-10-CM | POA: Diagnosis not present

## 2015-12-30 DIAGNOSIS — I10 Essential (primary) hypertension: Secondary | ICD-10-CM | POA: Diagnosis not present

## 2015-12-30 DIAGNOSIS — R011 Cardiac murmur, unspecified: Secondary | ICD-10-CM | POA: Diagnosis not present

## 2015-12-30 DIAGNOSIS — E785 Hyperlipidemia, unspecified: Secondary | ICD-10-CM | POA: Diagnosis not present

## 2015-12-30 DIAGNOSIS — I447 Left bundle-branch block, unspecified: Secondary | ICD-10-CM | POA: Diagnosis not present

## 2016-06-15 DIAGNOSIS — E1165 Type 2 diabetes mellitus with hyperglycemia: Secondary | ICD-10-CM | POA: Diagnosis not present

## 2016-06-15 DIAGNOSIS — I10 Essential (primary) hypertension: Secondary | ICD-10-CM | POA: Diagnosis not present

## 2016-06-15 DIAGNOSIS — Z6841 Body Mass Index (BMI) 40.0 and over, adult: Secondary | ICD-10-CM | POA: Diagnosis not present

## 2016-11-16 DIAGNOSIS — E782 Mixed hyperlipidemia: Secondary | ICD-10-CM | POA: Diagnosis not present

## 2016-11-16 DIAGNOSIS — Z9114 Patient's other noncompliance with medication regimen: Secondary | ICD-10-CM | POA: Diagnosis not present

## 2016-11-16 DIAGNOSIS — I10 Essential (primary) hypertension: Secondary | ICD-10-CM | POA: Diagnosis not present

## 2016-11-16 DIAGNOSIS — E1165 Type 2 diabetes mellitus with hyperglycemia: Secondary | ICD-10-CM | POA: Diagnosis not present

## 2017-03-30 DIAGNOSIS — E782 Mixed hyperlipidemia: Secondary | ICD-10-CM | POA: Diagnosis not present

## 2017-03-30 DIAGNOSIS — E559 Vitamin D deficiency, unspecified: Secondary | ICD-10-CM | POA: Diagnosis not present

## 2017-03-30 DIAGNOSIS — Z6841 Body Mass Index (BMI) 40.0 and over, adult: Secondary | ICD-10-CM | POA: Insufficient documentation

## 2017-03-30 DIAGNOSIS — R351 Nocturia: Secondary | ICD-10-CM | POA: Diagnosis not present

## 2017-03-30 DIAGNOSIS — E1165 Type 2 diabetes mellitus with hyperglycemia: Secondary | ICD-10-CM | POA: Diagnosis not present

## 2017-03-30 DIAGNOSIS — I10 Essential (primary) hypertension: Secondary | ICD-10-CM | POA: Diagnosis not present

## 2017-06-14 DIAGNOSIS — H1032 Unspecified acute conjunctivitis, left eye: Secondary | ICD-10-CM | POA: Insufficient documentation

## 2017-06-14 DIAGNOSIS — H905 Unspecified sensorineural hearing loss: Secondary | ICD-10-CM | POA: Insufficient documentation

## 2017-07-12 DIAGNOSIS — E782 Mixed hyperlipidemia: Secondary | ICD-10-CM | POA: Diagnosis not present

## 2017-07-12 DIAGNOSIS — I10 Essential (primary) hypertension: Secondary | ICD-10-CM | POA: Diagnosis not present

## 2017-07-12 DIAGNOSIS — E1142 Type 2 diabetes mellitus with diabetic polyneuropathy: Secondary | ICD-10-CM | POA: Diagnosis not present

## 2018-02-07 DIAGNOSIS — E1142 Type 2 diabetes mellitus with diabetic polyneuropathy: Secondary | ICD-10-CM | POA: Diagnosis not present

## 2018-02-07 DIAGNOSIS — Z9114 Patient's other noncompliance with medication regimen: Secondary | ICD-10-CM | POA: Diagnosis not present

## 2018-02-07 DIAGNOSIS — E785 Hyperlipidemia, unspecified: Secondary | ICD-10-CM | POA: Diagnosis not present

## 2018-02-07 DIAGNOSIS — I10 Essential (primary) hypertension: Secondary | ICD-10-CM | POA: Diagnosis not present

## 2018-02-07 DIAGNOSIS — E782 Mixed hyperlipidemia: Secondary | ICD-10-CM | POA: Diagnosis not present

## 2018-03-21 DIAGNOSIS — I454 Nonspecific intraventricular block: Secondary | ICD-10-CM | POA: Diagnosis not present

## 2018-03-21 DIAGNOSIS — R609 Edema, unspecified: Secondary | ICD-10-CM | POA: Diagnosis not present

## 2018-03-21 DIAGNOSIS — I447 Left bundle-branch block, unspecified: Secondary | ICD-10-CM | POA: Diagnosis not present

## 2018-03-21 DIAGNOSIS — I10 Essential (primary) hypertension: Secondary | ICD-10-CM | POA: Diagnosis not present

## 2018-03-21 DIAGNOSIS — R635 Abnormal weight gain: Secondary | ICD-10-CM | POA: Diagnosis not present

## 2018-03-28 ENCOUNTER — Ambulatory Visit: Payer: Medicare Other | Admitting: Podiatry

## 2018-04-04 DIAGNOSIS — R609 Edema, unspecified: Secondary | ICD-10-CM | POA: Diagnosis not present

## 2018-04-11 ENCOUNTER — Ambulatory Visit: Payer: Medicare Other | Admitting: Podiatry

## 2018-04-25 ENCOUNTER — Encounter: Payer: Self-pay | Admitting: Podiatry

## 2018-04-25 ENCOUNTER — Ambulatory Visit (INDEPENDENT_AMBULATORY_CARE_PROVIDER_SITE_OTHER): Payer: Medicare Other

## 2018-04-25 ENCOUNTER — Ambulatory Visit (INDEPENDENT_AMBULATORY_CARE_PROVIDER_SITE_OTHER): Payer: Medicare Other | Admitting: Podiatry

## 2018-04-25 DIAGNOSIS — E1149 Type 2 diabetes mellitus with other diabetic neurological complication: Secondary | ICD-10-CM

## 2018-04-25 DIAGNOSIS — N453 Epididymo-orchitis: Secondary | ICD-10-CM | POA: Insufficient documentation

## 2018-04-25 DIAGNOSIS — L818 Other specified disorders of pigmentation: Secondary | ICD-10-CM | POA: Insufficient documentation

## 2018-04-25 DIAGNOSIS — Z0000 Encounter for general adult medical examination without abnormal findings: Secondary | ICD-10-CM | POA: Insufficient documentation

## 2018-04-25 DIAGNOSIS — R0609 Other forms of dyspnea: Secondary | ICD-10-CM | POA: Insufficient documentation

## 2018-04-25 DIAGNOSIS — Z79899 Other long term (current) drug therapy: Secondary | ICD-10-CM | POA: Insufficient documentation

## 2018-04-25 DIAGNOSIS — I447 Left bundle-branch block, unspecified: Secondary | ICD-10-CM | POA: Insufficient documentation

## 2018-04-25 DIAGNOSIS — Z9119 Patient's noncompliance with other medical treatment and regimen: Secondary | ICD-10-CM | POA: Insufficient documentation

## 2018-04-25 DIAGNOSIS — H9190 Unspecified hearing loss, unspecified ear: Secondary | ICD-10-CM | POA: Insufficient documentation

## 2018-04-25 DIAGNOSIS — R635 Abnormal weight gain: Secondary | ICD-10-CM | POA: Insufficient documentation

## 2018-04-25 DIAGNOSIS — E785 Hyperlipidemia, unspecified: Secondary | ICD-10-CM | POA: Insufficient documentation

## 2018-04-25 DIAGNOSIS — R609 Edema, unspecified: Secondary | ICD-10-CM | POA: Insufficient documentation

## 2018-04-25 DIAGNOSIS — I739 Peripheral vascular disease, unspecified: Secondary | ICD-10-CM | POA: Diagnosis not present

## 2018-04-25 DIAGNOSIS — R0989 Other specified symptoms and signs involving the circulatory and respiratory systems: Secondary | ICD-10-CM | POA: Insufficient documentation

## 2018-04-25 DIAGNOSIS — Z91199 Patient's noncompliance with other medical treatment and regimen due to unspecified reason: Secondary | ICD-10-CM | POA: Insufficient documentation

## 2018-04-25 MED ORDER — GABAPENTIN 100 MG PO CAPS: 100.0000 mg | ORAL_CAPSULE | Freq: Every day | ORAL | 0 refills | Status: DC

## 2018-04-25 NOTE — Patient Instructions (Addendum)
Gabapentin capsules or tablets What is this medicine? GABAPENTIN (GA ba pen tin) is used to control partial seizures in adults with epilepsy. It is also used to treat certain types of nerve pain. This medicine may be used for other purposes; ask your health care provider or pharmacist if you have questions. COMMON BRAND NAME(S): Active-PAC with Gabapentin, Gabarone, Neurontin What should I tell my health care provider before I take this medicine? They need to know if you have any of these conditions: -kidney disease -suicidal thoughts, plans, or attempt; a previous suicide attempt by you or a family member -an unusual or allergic reaction to gabapentin, other medicines, foods, dyes, or preservatives -pregnant or trying to get pregnant -breast-feeding How should I use this medicine? Take this medicine by mouth with a glass of water. Follow the directions on the prescription label. You can take it with or without food. If it upsets your stomach, take it with food.Take your medicine at regular intervals. Do not take it more often than directed. Do not stop taking except on your doctor's advice. If you are directed to break the 600 or 800 mg tablets in half as part of your dose, the extra half tablet should be used for the next dose. If you have not used the extra half tablet within 28 days, it should be thrown away. A special MedGuide will be given to you by the pharmacist with each prescription and refill. Be sure to read this information carefully each time. Talk to your pediatrician regarding the use of this medicine in children. Special care may be needed. Overdosage: If you think you have taken too much of this medicine contact a poison control center or emergency room at once. NOTE: This medicine is only for you. Do not share this medicine with others. What if I miss a dose? If you miss a dose, take it as soon as you can. If it is almost time for your next dose, take only that dose. Do not  take double or extra doses. What may interact with this medicine? Do not take this medicine with any of the following medications: -other gabapentin products This medicine may also interact with the following medications: -alcohol -antacids -antihistamines for allergy, cough and cold -certain medicines for anxiety or sleep -certain medicines for depression or psychotic disturbances -homatropine; hydrocodone -naproxen -narcotic medicines (opiates) for pain -phenothiazines like chlorpromazine, mesoridazine, prochlorperazine, thioridazine This list may not describe all possible interactions. Give your health care provider a list of all the medicines, herbs, non-prescription drugs, or dietary supplements you use. Also tell them if you smoke, drink alcohol, or use illegal drugs. Some items may interact with your medicine. What should I watch for while using this medicine? Visit your doctor or health care professional for regular checks on your progress. You may want to keep a record at home of how you feel your condition is responding to treatment. You may want to share this information with your doctor or health care professional at each visit. You should contact your doctor or health care professional if your seizures get worse or if you have any new types of seizures. Do not stop taking this medicine or any of your seizure medicines unless instructed by your doctor or health care professional. Stopping your medicine suddenly can increase your seizures or their severity. Wear a medical identification bracelet or chain if you are taking this medicine for seizures, and carry a card that lists all your medications. You may get drowsy, dizzy,   or have blurred vision. Do not drive, use machinery, or do anything that needs mental alertness until you know how this medicine affects you. To reduce dizzy or fainting spells, do not sit or stand up quickly, especially if you are an older patient. Alcohol can  increase drowsiness and dizziness. Avoid alcoholic drinks. Your mouth may get dry. Chewing sugarless gum or sucking hard candy, and drinking plenty of water will help. The use of this medicine may increase the chance of suicidal thoughts or actions. Pay special attention to how you are responding while on this medicine. Any worsening of mood, or thoughts of suicide or dying should be reported to your health care professional right away. Women who become pregnant while using this medicine may enroll in the [place] American Antiepileptic Drug Pregnancy Registry by calling 1-888-233-2334. This registry collects information about the safety of antiepileptic drug use during pregnancy. What side effects may I notice from receiving this medicine? Side effects that you should report to your doctor or health care professional as soon as possible: -allergic reactions like skin rash, itching or hives, swelling of the face, lips, or tongue -worsening of mood, thoughts or actions of suicide or dying Side effects that usually do not require medical attention (report to your doctor or health care professional if they continue or are bothersome): -constipation -difficulty walking or controlling muscle movements -dizziness -nausea -slurred speech -tiredness -tremors -weight gain This list may not describe all possible side effects. Call your doctor for medical advice about side effects. You may report side effects to FDA at 1-800-FDA-1088. Where should I keep my medicine? Keep out of reach of children. This medicine may cause accidental overdose and death if it taken by other adults, children, or pets. Mix any unused medicine with a substance like cat litter or coffee grounds. Then throw the medicine away in a sealed container like a sealed bag or a coffee can with a lid. Do not use the medicine after the expiration date. Store at room temperature between 15 and 30 degrees C (59 and 86 degrees F). NOTE: This  sheet is a summary. It may not cover all possible information. If you have questions about this medicine, talk to your doctor, pharmacist, or health care provider.  2018 Elsevier/Gold Standard (2013-10-13 15:26:50)  

## 2018-04-27 ENCOUNTER — Telehealth: Payer: Self-pay | Admitting: *Deleted

## 2018-04-27 DIAGNOSIS — R609 Edema, unspecified: Secondary | ICD-10-CM

## 2018-04-27 NOTE — Telephone Encounter (Signed)
Orders faxed to [facility]. 

## 2018-04-27 NOTE — Progress Notes (Signed)
Subjective:   Patient ID: Mark [name] E Calleros, male   DOB: 57 y.o.   MRN: [id number]   HPI 57 year old male presents the office today for concerns of swelling to both of his legs and feet as well as for burning to his feet and legs.  He states that he has been on a fluid pill to help with the swelling in his primary care physician recently changed his medications to help with the swelling.  This is been a chronic issue.  He denies any specific pain in the calf.  No recent travel.  Denies any recent injury or falls.  He has no other concerns.   Review of Systems  All other systems reviewed and are negative.  Past Medical History:  Diagnosis Date  . Diabetes mellitus without complication (HCC)   . Hearing impaired   . Hypertension   . Sleep apnea     Past Surgical History:  Procedure Laterality Date  . BACK SURGERY    . TRACHEOSTOMY       Current Outpatient Medications:  .  carvedilol (COREG CR) 20 MG 24 hr capsule, Take 20 mg by mouth daily., Disp: , Rfl:  .  docusate sodium (COLACE) 100 MG capsule, Take 1 capsule (100 mg total) by mouth every 12 (twelve) hours., Disp: 60 capsule, Rfl: 0 .  furosemide (LASIX) 20 MG tablet, Take 20 mg by mouth 2 (two) times daily., Disp: , Rfl: 2 .  gabapentin (NEURONTIN) 100 MG capsule, Take 1 capsule (100 mg total) by mouth at bedtime., Disp: 90 capsule, Rfl: 0 .  lisinopril (PRINIVIL,ZESTRIL) 20 MG tablet, Take 20 mg by mouth daily., Disp: , Rfl:  .  metFORMIN (GLUCOPHAGE-XR) 500 MG 24 hr tablet, Take 500 mg by mouth daily with breakfast., Disp: , Rfl:   No Known Allergies      Objective:  Physical Exam  General: AAO x3, NAD  Dermatological: Skin is warm, dry and supple bilateral. Nails x 10 are well manicured; remaining integument appears unremarkable at this time. There are no open sores, no preulcerative lesions, no rash or signs of infection present.  Vascular: DP, PT pulses decreased but this could be secondary to swelling.  There is  decreased pedal hair.  There is swelling to bilateral legs and ankles but there is no pain with calf compression or erythema or warmth.  Neruologic: Sensation significantly decreased with Dorann OuSimms Weinstein monofilament  Musculoskeletal: Mild diffuse tenderness to bilateral legs and feet but there is no specific area of tenderness.  There is swelling present bilaterally but there is no erythema increased warmth there is no fluctuation or crepitation there is no abrasions or signs of infection.  Gait: Unassisted, Nonantalgic.      Assessment:   Bilateral lower extremity edema, neuropathy     Plan:  -Treatment options discussed including all alternatives, risks, and complications -Etiology of symptoms were discussed -X-rays were obtained and reviewed with the patient.  There is no evidence of acute fracture or stress fracture identified today. -Due to decreased pulses at the ABI in the office which is normal.  Is 1.13 bilaterally. -Given the swelling and will order a venous reflux study bilaterally. -Continue fluid pills by his primary care physician we discussed elevation and compression stockings. -Positive McBurney's to come in for more neuropathy.  Start gabapentin we discussed side effects of medication.  Start a low dose, 100 mg at nighttime and titrate up based on side effects and his response.   Vivi BarrackMatthew R Gabriela Giannelli DPM

## 2018-04-27 NOTE — Telephone Encounter (Signed)
-----   Message from Vivi BarrackMatthew R Wagoner, DPM sent at 04/27/2018 10:51 AM EDT ----- Can you please order a venous reflux study due to swelling b/l?  We discussed doing this possibility but I think it is best to go ahead and get it.   Thanks.

## 2018-05-07 ENCOUNTER — Inpatient Hospital Stay (HOSPITAL_COMMUNITY)
Admission: EM | Admit: 2018-05-07 | Discharge: 2018-05-17 | DRG: 291 | Disposition: A | Discharge status: Home / Self Care | Payer: Managed Care, Other (non HMO) | Attending: Internal Medicine | Admitting: Internal Medicine

## 2018-05-07 ENCOUNTER — Other Ambulatory Visit: Payer: Self-pay

## 2018-05-07 ENCOUNTER — Encounter (HOSPITAL_COMMUNITY): Payer: Self-pay | Admitting: *Deleted

## 2018-05-07 ENCOUNTER — Emergency Department (HOSPITAL_COMMUNITY): Payer: Managed Care, Other (non HMO)

## 2018-05-07 DIAGNOSIS — R778 Other specified abnormalities of plasma proteins: Secondary | ICD-10-CM

## 2018-05-07 DIAGNOSIS — Z6841 Body Mass Index (BMI) 40.0 and over, adult: Secondary | ICD-10-CM

## 2018-05-07 DIAGNOSIS — R748 Abnormal levels of other serum enzymes: Secondary | ICD-10-CM

## 2018-05-07 DIAGNOSIS — G4733 Obstructive sleep apnea (adult) (pediatric): Secondary | ICD-10-CM | POA: Diagnosis present

## 2018-05-07 DIAGNOSIS — I110 Hypertensive heart disease with heart failure: Secondary | ICD-10-CM | POA: Diagnosis not present

## 2018-05-07 DIAGNOSIS — I5033 Acute on chronic diastolic (congestive) heart failure: Secondary | ICD-10-CM | POA: Diagnosis present

## 2018-05-07 DIAGNOSIS — I161 Hypertensive emergency: Secondary | ICD-10-CM | POA: Diagnosis present

## 2018-05-07 DIAGNOSIS — I5082 Biventricular heart failure: Secondary | ICD-10-CM | POA: Diagnosis present

## 2018-05-07 DIAGNOSIS — Z7984 Long term (current) use of oral hypoglycemic drugs: Secondary | ICD-10-CM

## 2018-05-07 DIAGNOSIS — R7989 Other specified abnormal findings of blood chemistry: Secondary | ICD-10-CM

## 2018-05-07 DIAGNOSIS — Z9119 Patient's noncompliance with other medical treatment and regimen: Secondary | ICD-10-CM | POA: Diagnosis not present

## 2018-05-07 DIAGNOSIS — I5031 Acute diastolic (congestive) heart failure: Secondary | ICD-10-CM | POA: Diagnosis not present

## 2018-05-07 DIAGNOSIS — I5032 Chronic diastolic (congestive) heart failure: Secondary | ICD-10-CM

## 2018-05-07 DIAGNOSIS — I2729 Other secondary pulmonary hypertension: Secondary | ICD-10-CM | POA: Diagnosis present

## 2018-05-07 DIAGNOSIS — I2510 Atherosclerotic heart disease of native coronary artery without angina pectoris: Secondary | ICD-10-CM | POA: Diagnosis present

## 2018-05-07 DIAGNOSIS — I517 Cardiomegaly: Secondary | ICD-10-CM | POA: Diagnosis not present

## 2018-05-07 DIAGNOSIS — N179 Acute kidney failure, unspecified: Secondary | ICD-10-CM | POA: Diagnosis present

## 2018-05-07 DIAGNOSIS — I130 Hypertensive heart and chronic kidney disease with heart failure and stage 1 through stage 4 chronic kidney disease, or unspecified chronic kidney disease: Principal | ICD-10-CM | POA: Diagnosis present

## 2018-05-07 DIAGNOSIS — Z8249 Family history of ischemic heart disease and other diseases of the circulatory system: Secondary | ICD-10-CM | POA: Diagnosis not present

## 2018-05-07 DIAGNOSIS — I509 Heart failure, unspecified: Secondary | ICD-10-CM

## 2018-05-07 DIAGNOSIS — I160 Hypertensive urgency: Secondary | ICD-10-CM | POA: Diagnosis not present

## 2018-05-07 DIAGNOSIS — J9612 Chronic respiratory failure with hypercapnia: Secondary | ICD-10-CM | POA: Diagnosis present

## 2018-05-07 DIAGNOSIS — E1122 Type 2 diabetes mellitus with diabetic chronic kidney disease: Secondary | ICD-10-CM | POA: Diagnosis present

## 2018-05-07 DIAGNOSIS — I249 Acute ischemic heart disease, unspecified: Secondary | ICD-10-CM | POA: Diagnosis not present

## 2018-05-07 DIAGNOSIS — N183 Chronic kidney disease, stage 3 unspecified: Secondary | ICD-10-CM | POA: Diagnosis present

## 2018-05-07 DIAGNOSIS — E785 Hyperlipidemia, unspecified: Secondary | ICD-10-CM | POA: Diagnosis present

## 2018-05-07 DIAGNOSIS — R609 Edema, unspecified: Secondary | ICD-10-CM | POA: Diagnosis not present

## 2018-05-07 DIAGNOSIS — R0600 Dyspnea, unspecified: Secondary | ICD-10-CM

## 2018-05-07 DIAGNOSIS — J9601 Acute respiratory failure with hypoxia: Secondary | ICD-10-CM | POA: Diagnosis present

## 2018-05-07 DIAGNOSIS — R0902 Hypoxemia: Secondary | ICD-10-CM | POA: Diagnosis not present

## 2018-05-07 DIAGNOSIS — I447 Left bundle-branch block, unspecified: Secondary | ICD-10-CM | POA: Diagnosis not present

## 2018-05-07 DIAGNOSIS — R0602 Shortness of breath: Secondary | ICD-10-CM | POA: Diagnosis not present

## 2018-05-07 HISTORY — DX: Other specified abnormalities of plasma proteins: R77.8

## 2018-05-07 HISTORY — DX: Other specified abnormal findings of blood chemistry: R79.89

## 2018-05-07 HISTORY — DX: Chronic diastolic (congestive) heart failure: I50.32

## 2018-05-07 LAB — BASIC METABOLIC PANEL
Anion gap: 11 (ref 5–15)
BUN: 20 mg/dL (ref 6–20)
CHLORIDE: 106 mmol/L (ref 98–111)
CO2: 27 mmol/L (ref 22–32)
Calcium: 8.9 mg/dL (ref 8.9–10.3)
Creatinine, Ser: 1.44 mg/dL — ABNORMAL HIGH (ref 0.61–1.24)
GFR calc Af Amer: 60 mL/min — ABNORMAL LOW (ref >60)
GFR calc non Af Amer: 52 mL/min — ABNORMAL LOW (ref >60)
Glucose, Bld: 93 mg/dL (ref 70–99)
Potassium: 4.1 mmol/L (ref 3.5–5.1)
SODIUM: 144 mmol/L (ref 135–145)

## 2018-05-07 LAB — CBC
HCT: 50.4 % (ref 39.0–52.0)
HEMOGLOBIN: 14.5 g/dL (ref 13.0–17.0)
MCH: 27.9 pg (ref 26.0–34.0)
MCHC: 28.8 g/dL — AB (ref 30.0–36.0)
MCV: 96.9 fL (ref 78.0–100.0)
PLATELETS: 236 10*3/uL (ref 150–400)
RBC: 5.2 MIL/uL (ref 4.22–5.81)
RDW: 15.7 % — ABNORMAL HIGH (ref 11.5–15.5)
WBC: 5.8 10*3/uL (ref 4.0–10.5)

## 2018-05-07 LAB — TROPONIN I
TROPONIN I: 0.13 ng/mL — AB (ref <0.03)
TROPONIN I: 0.13 ng/mL — AB (ref <0.03)

## 2018-05-07 LAB — BRAIN NATRIURETIC PEPTIDE: B NATRIURETIC PEPTIDE 5: 654.7 pg/mL — AB (ref 0.0–100.0)

## 2018-05-07 MED ORDER — MORPHINE SULFATE (PF) 2 MG/ML IV SOLN
2.0000 mg | INTRAVENOUS | Status: DC | PRN
  Administered 2018-05-10: 2 mg via INTRAVENOUS
  Filled 2018-05-07: qty 1

## 2018-05-07 MED ORDER — ACETAMINOPHEN 325 MG PO TABS: 650.0000 mg | ORAL_TABLET | Freq: Four times a day (QID) | ORAL | Status: DC | PRN

## 2018-05-07 MED ORDER — ALPRAZOLAM 0.25 MG PO TABS: 0.2500 mg | ORAL_TABLET | Freq: Two times a day (BID) | ORAL | Status: DC | PRN

## 2018-05-07 MED ORDER — ZOLPIDEM TARTRATE 5 MG PO TABS: 5.0000 mg | ORAL_TABLET | Freq: Every evening | ORAL | Status: DC | PRN

## 2018-05-07 MED ORDER — HEPARIN (PORCINE) IN NACL 100-0.45 UNIT/ML-% IJ SOLN
1950.0000 [IU]/h | INTRAMUSCULAR | Status: DC
  Administered 2018-05-07: 1200 [IU]/h via INTRAVENOUS
  Administered 2018-05-08: 1800 [IU]/h via INTRAVENOUS
  Administered 2018-05-08: 1600 [IU]/h via INTRAVENOUS
  Administered 2018-05-09: 2200 [IU]/h via INTRAVENOUS
  Administered 2018-05-09 – 2018-05-10 (×2): 2000 [IU]/h via INTRAVENOUS
  Administered 2018-05-10 – 2018-05-14 (×7): 1950 [IU]/h via INTRAVENOUS
  Filled 2018-05-07 (×15): qty 250

## 2018-05-07 MED ORDER — HYDROXYZINE HCL 10 MG PO TABS: 10.0000 mg | ORAL_TABLET | Freq: Three times a day (TID) | ORAL | Status: DC | PRN

## 2018-05-07 MED ORDER — SODIUM CHLORIDE 0.9% FLUSH
3.0000 mL | INTRAVENOUS | Status: DC | PRN
  Administered 2018-05-10: 5 mL via INTRAVENOUS
  Filled 2018-05-07: qty 3

## 2018-05-07 MED ORDER — NITROGLYCERIN IN D5W 200-5 MCG/ML-% IV SOLN
0.0000 ug/min | INTRAVENOUS | Status: DC
  Administered 2018-05-07: 5 ug/min via INTRAVENOUS
  Filled 2018-05-07 (×2): qty 250

## 2018-05-07 MED ORDER — ATORVASTATIN CALCIUM 20 MG PO TABS
20.0000 mg | ORAL_TABLET | Freq: Every day | ORAL | Status: DC
  Administered 2018-05-07 – 2018-05-17 (×10): 20 mg via ORAL
  Filled 2018-05-07 (×11): qty 1

## 2018-05-07 MED ORDER — FUROSEMIDE 10 MG/ML IJ SOLN
60.0000 mg | Freq: Once | INTRAMUSCULAR | Status: AC
  Administered 2018-05-07: 60 mg via INTRAVENOUS
  Filled 2018-05-07: qty 6

## 2018-05-07 MED ORDER — SODIUM CHLORIDE 0.9 % IV SOLN
250.0000 mL | INTRAVENOUS | Status: DC | PRN
  Administered 2018-05-07 – 2018-05-10 (×2): 250 mL via INTRAVENOUS

## 2018-05-07 MED ORDER — SODIUM CHLORIDE 0.9% FLUSH
3.0000 mL | Freq: Two times a day (BID) | INTRAVENOUS | Status: DC
  Administered 2018-05-07 – 2018-05-17 (×18): 3 mL via INTRAVENOUS

## 2018-05-07 MED ORDER — CARVEDILOL 6.25 MG PO TABS
6.2500 mg | ORAL_TABLET | Freq: Two times a day (BID) | ORAL | Status: DC
  Administered 2018-05-08 – 2018-05-17 (×17): 6.25 mg via ORAL
  Filled 2018-05-07 (×18): qty 1

## 2018-05-07 MED ORDER — ASPIRIN EC 81 MG PO TBEC
81.0000 mg | DELAYED_RELEASE_TABLET | Freq: Every day | ORAL | Status: DC
  Administered 2018-05-07 – 2018-05-17 (×10): 81 mg via ORAL
  Filled 2018-05-07 (×12): qty 1

## 2018-05-07 MED ORDER — HEPARIN BOLUS VIA INFUSION
4000.0000 [IU] | Freq: Once | INTRAVENOUS | Status: AC
  Administered 2018-05-07: 4000 [IU] via INTRAVENOUS
  Filled 2018-05-07: qty 4000

## 2018-05-07 MED ORDER — HYDRALAZINE HCL 20 MG/ML IJ SOLN: 5.0000 mg | INTRAMUSCULAR | Status: DC | PRN

## 2018-05-07 MED ORDER — GABAPENTIN 100 MG PO CAPS
100.0000 mg | ORAL_CAPSULE | Freq: Every day | ORAL | Status: DC
  Administered 2018-05-07 – 2018-05-16 (×9): 100 mg via ORAL
  Filled 2018-05-07 (×8): qty 1

## 2018-05-07 MED ORDER — LOSARTAN POTASSIUM 25 MG PO TABS
25.0000 mg | ORAL_TABLET | Freq: Every day | ORAL | Status: DC
  Administered 2018-05-09 – 2018-05-17 (×9): 25 mg via ORAL
  Filled 2018-05-07 (×10): qty 1

## 2018-05-07 MED ORDER — FUROSEMIDE 10 MG/ML IJ SOLN
40.0000 mg | Freq: Two times a day (BID) | INTRAMUSCULAR | Status: DC
  Administered 2018-05-08 – 2018-05-11 (×7): 40 mg via INTRAVENOUS
  Filled 2018-05-07 (×7): qty 4

## 2018-05-07 NOTE — ED Provider Notes (Signed)
[facility] EMERGENCY DEPARTMENT Provider Note   CSN: [id number] Arrival date & time: 05/07/18  1801     History   Chief Complaint Chief Complaint  Patient presents with  . Shortness of Breath    HPI Mark [name] is a 57 y.o. male.  HPI  57 yo male ho dm, chf presents with increased dyspnea for last 3 weeks. Followed by Dr. Allyne Gee, seen and had changed hctz to lasix 20 mg twice daily.  Increased leg swelling and abdominal swelling.  No chest pain   Past Medical History:  Diagnosis Date  . Diabetes mellitus without complication (HCC)   . Hearing impaired   . Hypertension   . Sleep apnea     Patient Active Problem List   Diagnosis Date Noted  . Abnormal weight gain 04/25/2018  . Edema, unspecified 04/25/2018  . Encntr for general adult medical exam w/o abnormal findings 04/25/2018  . Hearing loss 04/25/2018  . Left bundle-branch block, unspecified 04/25/2018  . Hyperlipidemia 04/25/2018  . Orchitis and epididymitis 04/25/2018  . Other dyspnea and respiratory abnormality 04/25/2018  . Other long term (current) drug therapy 04/25/2018  . Other specified disorders of pigmentation 04/25/2018  . Personal history of noncompliance with medical treatment, presenting hazards to health 04/25/2018  . Acute conjunctivitis of left eye 06/14/2017  . Sensorineural hearing loss (SNHL) of left ear 06/14/2017  . Body mass index (BMI) of 40.0-44.9 in adult (HCC) 03/30/2017  . Benign essential hypertension 06/05/2013  . Morbid obesity (HCC) 06/05/2013  . Other malaise and fatigue 06/05/2013  . Pure hypercholesterolemia 06/05/2013  . Vitamin D deficiency 06/05/2013  . Corns and callosity 11/03/2010  . Onychia and paronychia of finger 10/22/2010    Past Surgical History:  Procedure Laterality Date  . BACK SURGERY    . TRACHEOSTOMY          Home Medications    Prior to Admission medications   Medication Sig Start Date End Date Taking? Authorizing  Provider  atorvastatin (LIPITOR) 20 MG tablet Take 20 mg by mouth daily.   Yes [provider]  carvedilol (COREG) 6.25 MG tablet Take 6.25 mg by mouth 2 (two) times daily with a meal.   Yes [provider]  furosemide (LASIX) 20 MG tablet Take 20 mg by mouth 2 (two) times daily. 04/11/18  Yes [provider]  gabapentin (NEURONTIN) 100 MG capsule Take 1 capsule (100 mg total) by mouth at bedtime. 04/25/18  Yes Vivi Barrack, DPM  metFORMIN (GLUCOPHAGE) 500 MG tablet Take 500 mg by mouth 2 (two) times daily with a meal.   Yes [provider]    Family History No family history on file.  Social History Social History   Tobacco Use  . Smoking status: Never Smoker  . Smokeless tobacco: Never Used  Substance Use Topics  . Alcohol use: No  . Drug use: No     Allergies   Patient has no known allergies.   Review of Systems Review of Systems  Constitutional: Positive for activity change and unexpected weight change. Negative for fever.  HENT: Negative.   Eyes: Negative.   Respiratory: Positive for shortness of breath.   Cardiovascular: Positive for leg swelling.  Gastrointestinal: Positive for abdominal distention.  Endocrine: Negative.   Genitourinary: Negative.   Musculoskeletal: Negative.   Allergic/Immunologic: Negative.   Neurological: Negative.   Hematological: Negative.   Psychiatric/Behavioral: Negative.   All other systems reviewed and are negative.    Physical Exam  Updated Vital Signs BP (!) 168/111 (BP Location: Right Arm)   Pulse 94   Temp 99.3 F (37.4 C) (Oral)   Resp 16   Ht 1.803 m (5\' 11" )   Wt (!) 143.8 kg   SpO2 95%   BMI 44.21 kg/m   Physical Exam  Constitutional: He appears well-developed and well-nourished.  HENT:  Head: Normocephalic and atraumatic.  Mouth/Throat: Oropharynx is clear and moist.  Eyes: Pupils are equal, round, and reactive to light.  Neck: Normal range of motion.  Cardiovascular:  Normal rate and regular rhythm.  Pulmonary/Chest: Effort normal.  Abdominal: He exhibits distension.  Musculoskeletal:       Right lower leg: He exhibits edema.       Left lower leg: He exhibits edema.  Neurological: He is alert.  Skin: Skin is warm and dry. Capillary refill takes less than 2 seconds.  Psychiatric: He has a normal mood and affect.  Nursing note and vitals reviewed.    ED Treatments / Results  Labs (all labs ordered are listed, but only abnormal results are displayed) Labs Reviewed  CBC - Abnormal; Notable for the following components:      Result Value   MCHC 28.8 (*)    RDW 15.7 (*)    All other components within normal limits  BASIC METABOLIC PANEL  TROPONIN I  BRAIN NATRIURETIC PEPTIDE  HEPATIC FUNCTION PANEL    EKG EKG Interpretation  Date/Time:  Saturday May 07 2018 18:12:14 EDT Ventricular Rate:  93 PR Interval:  170 QRS Duration: 168 QT Interval:  408 QTC Calculation: 507 R Axis:   62 Text Interpretation:  Normal sinus rhythm Possible Left atrial enlargement Left bundle branch block Abnormal ECG Confirmed by Margarita Grizzle [phone]) on 05/07/2018 7:00:08 PM   Radiology Dg Chest 2 View  Result Date: 05/07/2018 CLINICAL DATA:  Shortness of breath. Hypoxia. Fluid retention with bilateral lower extremity swelling for 2 months. EXAM: CHEST - 2 VIEW COMPARISON:  None. FINDINGS: Moderate cardiomegaly. Pulmonary vascular congestion is noted, however there is no evidence of frank pulmonary edema or pleural effusion. Lordotic positioning noted. IMPRESSION: Cardiomegaly and pulmonary vascular congestion. No evidence of frank pulmonary edema or consolidation. Electronically Signed   By: Myles Rosenthal M.D.   On: 05/07/2018 20:04    Procedures Procedures (including critical care time)  Medications Ordered in ED Medications - No data to display   Initial Impression / Assessment and Plan / ED Course  I have reviewed the triage vital signs and the nursing  notes.  Pertinent labs & imaging results that were available during my care of the patient were reviewed by me and considered in my medical decision making (see chart for details).    57 year old male history of hypertension, type 2 diabetes presents today with increasing peripheral edema, abdominal girth, weight gain, and dyspnea.  He denies any chest pain.  Evaluation here is significant for new left bundle branch block on EKG with elevated troponin at 0.13.  Patient also with cardiomegaly and vascular congestion with some CHF noted on chest x-Rolly Magri.  He is treated here with Lasix.  I discussed patient's care with Dr. Mayford Knife, on-call for cardiology.  See for evaluation patient will need echo plan admission to hospitalist for further evaluation and treatment. Dyspnea--patient with cardiomegaly and vascular congestion.  New oxygen requirement.  Patient with dyspnea likely secondary to CHF Renal insufficiency patient with creatinine increased from last of 0.98-1.44 today.  However, most recent creatinine is from 2013 Abnormal EKG patient  with left bundle branch block and elevated troponin in ED today.  However, her symptoms have been gradually progressive over several weeks and he has not noted any chest pain. Discussed with Dr. Clyde Lundborg and will see Final Clinical Impressions(s) / ED Diagnoses   Final diagnoses:  Dyspnea, unspecified type  Congestive heart failure, unspecified HF chronicity, unspecified heart failure type [facility])  Cardiomegaly  Hypoxia  Elevated troponin    ED Discharge Orders    None       Margarita Grizzle, MD 05/07/18 2047

## 2018-05-07 NOTE — ED Triage Notes (Signed)
The pt is c/o sob for 2 months  C/o pain and swelling in both feet and ankles for 2 weeks  sats low

## 2018-05-07 NOTE — ED Notes (Signed)
Nasal 02 in triage

## 2018-05-07 NOTE — Consult Note (Addendum)
Admit date: 05/07/2018 Referring Physician  Dr. Rosalia Hammers Primary Physician  Dorothyann Peng, MD Primary Cardiologist  None (new) Reason for Consultation  SOB, CHF  HPI: Mark [name] is a 57 y.o. male who is being seen today for the evaluation of CHF at the request of Dr. Rosalia Hammers.  This is a 57 year old morbidly obese male with a history of type 2 diabetes mellitus, hypertension, morbid obesity, hyperlipidemia, medical noncompliance and obstructive sleep apnea who has chronic lower extremity edema and has recently been changed from HCTZ to Lasix 20 mg twice daily by his PCP Dr. Allyne Gee.  For the past 3 weeks he has noted increasing dyspnea on exertion as well as worsening lower extremity edema and increased abdominal swelling.  He presented to the emergency room due to worsening of his symptoms.  He denies any chest pain or pressure, PND, orthopnea, dizziness, palpitations.  Cardiology is asked to consult for help with evaluation.  On admission to the ER he was noted to be markedly hypertensive with a blood pressure of 177/126 mmHg.  O2 saturations 94 to 96% on room air.  Chest x-ray revealed cardiomegaly with pulmonary vascular congestion but no frank edema.  EKG showed normal sinus rhythm with left bundle branch block.  Labs were noted for acute kidney injury with creatinine 1.44, sodium 144, BNP elevated 654 and troponin mildly elevated at 0.13.   PMH:   Past Medical History:  Diagnosis Date  . Diabetes mellitus without complication (HCC)   . Hearing impaired   . Hypertension   . Sleep apnea      PSH:   Past Surgical History:  Procedure Laterality Date  . BACK SURGERY    . TRACHEOSTOMY      Allergies:  Patient has no known allergies. Prior to Admit Meds:   (Not in a hospital admission) Fam HX:  Family history reviewed and both his mother and father have HTN Social HX:    Social History   Socioeconomic History  . Marital status: Married    Spouse name: Not on file  . Number of  children: Not on file  . Years of education: Not on file  . Highest education level: Not on file  Occupational History  . Not on file  Social Needs  . Financial resource strain: Not on file  . Food insecurity:    Worry: Not on file    Inability: Not on file  . Transportation needs:    Medical: Not on file    Non-medical: Not on file  Tobacco Use  . Smoking status: Never Smoker  . Smokeless tobacco: Never Used  Substance and Sexual Activity  . Alcohol use: No  . Drug use: No  . Sexual activity: Not on file  Lifestyle  . Physical activity:    Days per week: Not on file    Minutes per session: Not on file  . Stress: Not on file  Relationships  . Social connections:    Talks on phone: Not on file    Gets together: Not on file    Attends religious service: Not on file    Active member of club or organization: Not on file    Attends meetings of clubs or organizations: Not on file    Relationship status: Not on file  . Intimate partner violence:    Fear of current or ex partner: Not on file    Emotionally abused: Not on file    Physically abused: Not on file  Forced sexual activity: Not on file  Other Topics Concern  . Not on file  Social History Narrative  . Not on file     ROS:  All  ROS were addressed and are negative except what is stated in the HPI  Physical Exam: Blood pressure (!) 173/124, pulse 91, temperature 99.3 F (37.4 C), temperature source Oral, resp. rate 18, height 5\' 11"  (1.803 m), weight (!) 143.8 kg, SpO2 94 %.    General: Well developed, well nourished, in no acute distress Head: Eyes PERRLA, No xanthomas.   Normal cephalic and atramatic  Lungs:  Decreased BS at bases Heart:   HRRR S1 S2 Pulses are 2+ & equal.            No carotid bruit. No JVD.  No abdominal bruits. No femoral bruits. Abdomen: Bowel sounds are positive, abdomen firm but  non-tender without masses or                  Hernia's noted. Msk:  Back normal, normal gait. Normal  strength and tone for age. Extremities:   No clubbing, cyanosis.  2+bilateral LE edema.  DP +1 Neuro: Alert and oriented X 3. Psych:  Good affect, responds appropriately    Labs:   Lab Results  Component Value Date   WBC 5.8 05/07/2018   HGB 14.5 05/07/2018   HCT 50.4 05/07/2018   MCV 96.9 05/07/2018   PLT 236 05/07/2018   Recent Labs  Lab 05/07/18 1823  NA 144  K 4.1  CL 106  CO2 27  BUN 20  CREATININE 1.44*  CALCIUM 8.9  GLUCOSE 93   No results found for: PTT No results found for: INR, PROTIME Lab Results  Component Value Date   CKTOTAL 318 (H) 01/07/2008   CKMB 8.0 (H) 01/07/2008   TROPONINI 0.13 (HH) 05/07/2018    No results found for: CHOL No results found for: HDL No results found for: LDLCALC No results found for: TRIG No results found for: CHOLHDL No results found for: LDLDIRECT    Radiology:  Dg Chest 2 View  Result Date: 05/07/2018 CLINICAL DATA:  Shortness of breath. Hypoxia. Fluid retention with bilateral lower extremity swelling for 2 months. EXAM: CHEST - 2 VIEW COMPARISON:  None. FINDINGS: Moderate cardiomegaly. Pulmonary vascular congestion is noted, however there is no evidence of frank pulmonary edema or pleural effusion. Lordotic positioning noted. IMPRESSION: Cardiomegaly and pulmonary vascular congestion. No evidence of frank pulmonary edema or consolidation. Electronically Signed   By: Myles Rosenthal M.D.   On: 05/07/2018 20:04     Telemetry    Normal sinus rhythm- Personally Reviewed  ECG    Normal sinus rhythm with left bundle branch block- Personally Reviewed   ASSESSMENT/PLAN:   1.  Acute CHF -Chest x-ray shows moderate cardiomegaly although this could be due to a large fat pad -He will need 2D echocardiogram in the a.m. to evaluate LV size and systolic function -Suspect poorly controlled hypertension is playing a role in this. -Start Lasix 40 mg IV twice daily -Follow strict I and O's, daily weights and renal function  closely -Check TSH -He needs aggressive control of his blood pressure -Further medical therapy pending results of 2D echocardiogram  2.  Hypertensive urgency -BP markedly elevated on admission -He has a history of medical noncompliance in the past -Continue carvedilol 6.25 mg twice daily -Start losartan 25 mg daily -IV nitroglycerin drip titrate to keep systolic blood pressure less than 150 and diastolic blood pressure  less than 90 -Check TSH  3.  Left bundle branch block -This is new since his last EKG in 2012 -Ischemic work-up pending results of 2D echo  4.  Elevated troponin -This is mildly elevated at 0.13 and likely related to demand ischemia in the setting of CHF exacerbation as well as hypertensive urgency -continue to cycle trop until it peaks -start IV NTG gtt, ASA 81mg  daily -continue BB -IV heparin once BP has improved -2D echo to assess LVF -further ischemic workup pending results of echo.  5.  Type 2 DM -BS 93 -diet controlled -check HbA1C  6.  Hyperlipidemia -continue Lipitor 20mg  daily -check FLP in am  7.  OSA -CPAP usage encouraged -consider referral to surgery for weight loss surgery    Armanda Magic, MD  05/07/2018  8:35 PM

## 2018-05-07 NOTE — H&P (Signed)
History and Physical    Mark [name] ZOX:[id number] DOB: Jun 04, 1961 DOA: 05/07/2018  Referring MD/NP/PA:   PCP: Dorothyann Peng, MD   Patient coming from:  The patient is coming from home.  At baseline, pt is independent for most of ADL.   Chief Complaint: Shortness of breath, bilateral leg edema  HPI: Mark [name] is a 57 y.o. male with medical history significant of hypertension, hyperlipidemia, diabetes mellitus, obesity, OSA not on CPAP, left ear hearing loss, CKD-3, left bundle blockage, who presents with shortness of breath and bilateral leg edema.  Patient states that he has been having shortness of breath in the past 2 months, which has worsened in the past 3 months.  He denies any chest pain.  He has dry cough. He states that his bilateral leg edema has also progressively worsened in the past 2 weeks.  His PCP switch that his HCTZ to Lasix at 20 mg twice daily, but without significant help.  Patient does not have nausea, vomiting, diarrhea, abdominal pain, symptoms of UTI or unilateral weakness.  No fever or chills.  ED Course: pt was found to have BNP 657, trop 0.13, WBC 5.8, worsening renal function since 2013, temperature 99.3, heart rate in 90s, no tachypnea, oxygen saturation 80% on room air.  Chest x-ray showed cardiomegaly and vascular congestion.  Patient is admitted to stepdown bed as inpatient.  Cardiology, Dr. Mayford Knife was consulted.  Review of Systems:   General: no fevers, chills, has body weight gain, has poor appetite, has fatigue HEENT: no blurry vision, hearing changes or sore throat Respiratory: has dyspnea, coughing, no wheezing CV: no chest pain, no palpitations GI: no nausea, vomiting, abdominal pain, diarrhea, constipation GU: no dysuria, burning on urination, increased urinary frequency, hematuria  Ext: has leg edema Neuro: no unilateral weakness, numbness, or tingling, no vision change. Has chronic left ear hearing loss Skin: no rash, no skin  tear. MSK: No muscle spasm, no deformity, no limitation of range of movement in spin Heme: No easy bruising.  Travel history: No recent long distant travel.  Allergy: No Known Allergies  Past Medical History:  Diagnosis Date  . Diabetes mellitus without complication (HCC)   . Hearing impaired   . Hypertension   . Sleep apnea     Past Surgical History:  Procedure Laterality Date  . BACK SURGERY    . TRACHEOSTOMY      Social History:  reports that he has never smoked. He has never used smokeless tobacco. He reports that he does not drink alcohol or use drugs.  Family History:  Family History  Problem Relation Age of Onset  . Hypertension Mother   . Hypertension Father      Prior to Admission medications   Medication Sig Start Date End Date Taking? Authorizing Provider  atorvastatin (LIPITOR) 20 MG tablet Take 20 mg by mouth daily.   Yes [provider]  carvedilol (COREG) 6.25 MG tablet Take 6.25 mg by mouth 2 (two) times daily with a meal.   Yes [provider]  furosemide (LASIX) 20 MG tablet Take 20 mg by mouth 2 (two) times daily. 04/11/18  Yes [provider]  gabapentin (NEURONTIN) 100 MG capsule Take 1 capsule (100 mg total) by mouth at bedtime. 04/25/18  Yes Vivi Barrack, DPM  metFORMIN (GLUCOPHAGE) 500 MG tablet Take 500 mg by mouth 2 (two) times daily with a meal.   Yes [provider]    Physical Exam: Vitals:   05/07/18 2245  05/07/18 2250 05/07/18 2310 05/08/18 0000  BP: (!) 160/106 (!) 171/102 (!) 162/106 (!) 141/76  Pulse: 92 90 90 91  Resp: (!) 37 17 (!) 29 (!) 26  Temp:      TempSrc:      SpO2: 96% 97% 95% (!) 75%  Weight:      Height:       General: Not in acute distress HEENT:       Eyes: PERRL, EOMI, no scleral icterus.       ENT: No discharge from the ears and nose, no pharynx injection, no tonsillar enlargement.        Neck: Difficult to assess JVD due to morbid obesity, no bruit, no mass felt. Heme: No  neck lymph node enlargement. Cardiac: S1/S2, RRR, No murmurs, No gallops or rubs. Respiratory:  No rales, wheezing, rhonchi or rubs. GI: Soft, nondistended, nontender, no rebound pain, no organomegaly, BS present. GU: No hematuria Ext: 3+ pitting leg edema bilaterally. 2+DP/PT pulse bilaterally. Musculoskeletal: No joint deformities, No joint redness or warmth, no limitation of ROM in spin. Skin: No rashes.  Neuro: Alert, oriented X3, cranial nerves II-XII grossly intact, moves all extremities normally. Psych: Patient is not psychotic, no suicidal or hemocidal ideation.  Labs on Admission: I have personally reviewed following labs and imaging studies  CBC: Recent Labs  Lab 05/07/18 1823  WBC 5.8  HGB 14.5  HCT 50.4  MCV 96.9  PLT 236   Basic Metabolic Panel: Recent Labs  Lab 05/07/18 1823  NA 144  K 4.1  CL 106  CO2 27  GLUCOSE 93  BUN 20  CREATININE 1.44*  CALCIUM 8.9   GFR: Estimated Creatinine Clearance: 80.2 mL/min (A) (by C-G formula based on SCr of 1.44 mg/dL (H)). Liver Function Tests: No results for input(s): AST, ALT, ALKPHOS, BILITOT, PROT, ALBUMIN in the last 168 hours. No results for input(s): LIPASE, AMYLASE in the last 168 hours. No results for input(s): AMMONIA in the last 168 hours. Coagulation Profile: No results for input(s): INR, PROTIME in the last 168 hours. Cardiac Enzymes: Recent Labs  Lab 05/07/18 1823 05/07/18 2201  TROPONINI 0.13* 0.13*   BNP (last 3 results) No results for input(s): PROBNP in the last 8760 hours. HbA1C: No results for input(s): HGBA1C in the last 72 hours. CBG: No results for input(s): GLUCAP in the last 168 hours. Lipid Profile: No results for input(s): CHOL, HDL, LDLCALC, TRIG, CHOLHDL, LDLDIRECT in the last 72 hours. Thyroid Function Tests: No results for input(s): TSH, T4TOTAL, FREET4, T3FREE, THYROIDAB in the last 72 hours. Anemia Panel: No results for input(s): VITAMINB12, FOLATE, FERRITIN, TIBC, IRON,  RETICCTPCT in the last 72 hours. Urine analysis:    Component Value Date/Time   COLORURINE YELLOW 01/07/2008 1805   APPEARANCEUR CLOUDY (A) 01/07/2008 1805   LABSPEC 1.019 01/07/2008 1805   PHURINE 5.0 01/07/2008 1805   GLUCOSEU NEGATIVE 01/07/2008 1805   HGBUR SMALL (A) 01/07/2008 1805   BILIRUBINUR SMALL (A) 01/07/2008 1805   KETONESUR NEGATIVE 01/07/2008 1805   PROTEINUR 30 (A) 01/07/2008 1805   UROBILINOGEN 0.2 01/07/2008 1805   NITRITE NEGATIVE 01/07/2008 1805   LEUKOCYTESUR NEGATIVE 01/07/2008 1805   Sepsis Labs: @LABRCNTIP (procalcitonin:4,lacticidven:4) )No results found for this or any previous visit (from the past 240 hour(s)).   Radiological Exams on Admission: Dg Chest 2 View  Result Date: 05/07/2018 CLINICAL DATA:  Shortness of breath. Hypoxia. Fluid retention with bilateral lower extremity swelling for 2 months. EXAM: CHEST - 2 VIEW COMPARISON:  None.  FINDINGS: Moderate cardiomegaly. Pulmonary vascular congestion is noted, however there is no evidence of frank pulmonary edema or pleural effusion. Lordotic positioning noted. IMPRESSION: Cardiomegaly and pulmonary vascular congestion. No evidence of frank pulmonary edema or consolidation. Electronically Signed   By: Myles Rosenthal M.D.   On: 05/07/2018 20:04     EKG: Independently reviewed.  Sinus rhythm, QTC 507, poor R wave progression, RAD, left bundle blockage which is not new, presented in previous EKG.  Assessment/Plan Principal Problem:   Acute CHF (congestive heart failure) (HCC) Active Problems:   Hyperlipidemia   Acute respiratory failure with hypoxia (HCC)   Elevated troponin   CKD (chronic kidney disease), stage III (HCC)   Hypertensive urgency   Acute CHF (congestive heart failure) [facility]): Patient has shortness of breath, bilateral leg edema, elevated BNP, vascular congestion on chest x-ray, clinically consistent with acute CHF.  Not sure which type of CHF, but possibly due to diastolic congestive heart  failure given long history of hypertension.  Patient also has hypertensive emergency.  Cardiology, Dr. Mayford Knife was consulted.  She recommended to start IV Lasix, nitroglycerin drip, check TSH.  -will admit to SDU as inpt -Highly appreciate Dr. Norris Cross consultation. -Lasix 40 mg bid by IV (patient received 1 dose of IV Lasix 60 mg in ED) - NTG gtt to keep systolic blood pressure less than 150 and diastolic blood pressure less than 90 -2d echo -TSH -will continue home coreg -start losartan per Dr. Mayford Knife -Daily weights -strict I/O's -Low salt diet  Hypertensive urgency --Continue carvedilol 6.25 mg twice daily - Start losartan 25 mg daily - on NTG gtt  Hx of CAD and Elevated troponin: Patient does not have chest pain, possibly due to demand ischemia secondary to hypertensive emergency and CHF, but cannot completely rule out non-STEMI. -prn morphine -continue to cycle trop until it peaks -start IV NTG gtt, ASA 81mg  daily -continue BB, coreg -IV heparin once BP has improved per Dr. Mayford Knife -2D echo to assess LVF  Hyperlipidemia -continue Lipitor 20mg  daily  OSA -refused CPAP   CKD (chronic kidney disease), stage III: Creatinine was 0.98 on 07/21/2012, today creatinine is 1.44, BUN 20, not sure if this is acute worsening or chronically progressed, but later is more likely. -Follow-up renal function BP map.    Inpatient status:  # Patient requires inpatient status due to high intensity of service, high risk for further deterioration and high frequency of surveillance required.  I certify that at the point of admission it is my clinical judgment that the patient will require inpatient hospital care spanning beyond 2 midnights from the point of admission.  . This patient has multiple chronic comorbidities including hypertension, hyperlipidemia, diabetes mellitus, obesity, OSA not on CPAP, left ear hearing loss, CKD-3, left bundle blockage . Now patient has presenting symptoms  include shortness of breath and bilateral leg edema. . The worrisome physical exam findings include 3+ pitting leg edema bilaterally.  Elevated blood pressure . The initial radiographic and laboratory data are worrisome because of positive troponin, elevated BNP, worsening renal function, chest x-ray showed cardiomegaly and vascular congestion . Current medical needs: please see my assessment and plan       DVT ppx: on IV Heparin    Code Status: Full code Family Communication: None at bed side.    Disposition Plan:  Anticipate discharge back to previous home environment Consults called:  Dr. Mayford Knife Admission status:   SDU/inpation       Date of Service 05/08/2018  Lorretta Harp [facility] Pager [phone]  If 7PM-7AM, please contact night-coverage [email or web address] Password [email or web address] 05/08/2018, 1:21 AM

## 2018-05-07 NOTE — Progress Notes (Addendum)
ANTICOAGULATION CONSULT NOTE - Initial Consult  Pharmacy Consult for heparin Indication: chest pain/ACS  No Known Allergies  Patient Measurements: Height: 5\' 11"  (180.3 cm) Weight: (!) 317 lb (143.8 kg) IBW/kg (Calculated) : 75.3 Heparin Dosing Weight: 109 kg  Vital Signs: Temp: 98.2 F (36.8 C) (09/07 2242) Temp Source: Oral (09/07 2242) BP: 182/112 (09/07 2200) Pulse Rate: 88 (09/07 2242)  Labs: Recent Labs    05/07/18 1823 05/07/18 2201  HGB 14.5  --   HCT 50.4  --   PLT 236  --   CREATININE 1.44*  --   TROPONINI 0.13* 0.13*    Estimated Creatinine Clearance: 80.2 mL/min (A) (by C-G formula based on SCr of 1.44 mg/dL (H)).   Medical History: Past Medical History:  Diagnosis Date  . Diabetes mellitus without complication (HCC)   . Hearing impaired   . Hypertension   . Sleep apnea     Medications:  Medications Prior to Admission  Medication Sig Dispense Refill Last Dose  . atorvastatin (LIPITOR) 20 MG tablet Take 20 mg by mouth daily.   05/07/2018 at Unknown time  . carvedilol (COREG) 6.25 MG tablet Take 6.25 mg by mouth 2 (two) times daily with a meal.   05/07/2018 at 0500  . furosemide (LASIX) 20 MG tablet Take 20 mg by mouth 2 (two) times daily.  2 05/07/2018 at Unknown time  . gabapentin (NEURONTIN) 100 MG capsule Take 1 capsule (100 mg total) by mouth at bedtime. 90 capsule 0 05/06/2018 at Unknown time  . metFORMIN (GLUCOPHAGE) 500 MG tablet Take 500 mg by mouth 2 (two) times daily with a meal.   05/07/2018 at Unknown time    Assessment: 57 yo man with CP to start heparin when SBP <150.  He was not on anticoagulation PTA Goal of Therapy:  Heparin level 0.3-0.7 units/ml Monitor platelets by anticoagulation protocol: Yes   Plan:  Heparin bolus 4000 units and drip at 1200 units/hr Check heparin level 6-8 hours after start Daily HL and CBC while on heparin  Terricka Onofrio Poteet 05/07/2018,10:46 PM   Addum:  Heparin level 0.18 units/ml.  Will rebolus with 3000  units and increase drip to 1600 units/hr.  Check heparin level 6 hours after rate change

## 2018-05-08 DIAGNOSIS — J9601 Acute respiratory failure with hypoxia: Secondary | ICD-10-CM

## 2018-05-08 LAB — RAPID URINE DRUG SCREEN, HOSP PERFORMED
AMPHETAMINES: NOT DETECTED
Barbiturates: NOT DETECTED
Benzodiazepines: NOT DETECTED
Cocaine: NOT DETECTED
OPIATES: NOT DETECTED
Tetrahydrocannabinol: NOT DETECTED

## 2018-05-08 LAB — HEPATIC FUNCTION PANEL
ALBUMIN: 3 g/dL — AB (ref 3.5–5.0)
ALT: 24 U/L (ref 0–44)
AST: 23 U/L (ref 15–41)
Alkaline Phosphatase: 102 U/L (ref 38–126)
BILIRUBIN DIRECT: 0.3 mg/dL — AB (ref 0.0–0.2)
Indirect Bilirubin: 0.9 mg/dL (ref 0.3–0.9)
Total Bilirubin: 1.2 mg/dL (ref 0.3–1.2)
Total Protein: 5.9 g/dL — ABNORMAL LOW (ref 6.5–8.1)

## 2018-05-08 LAB — GLUCOSE, CAPILLARY
GLUCOSE-CAPILLARY: 102 mg/dL — AB (ref 70–99)
GLUCOSE-CAPILLARY: 84 mg/dL (ref 70–99)
Glucose-Capillary: 75 mg/dL (ref 70–99)
Glucose-Capillary: 140 mg/dL — ABNORMAL HIGH (ref 70–99)

## 2018-05-08 LAB — LIPID PANEL
CHOL/HDL RATIO: 2.8 ratio
CHOLESTEROL: 103 mg/dL (ref 0–200)
HDL: 37 mg/dL — AB (ref >40)
LDL Cholesterol: 58 mg/dL (ref 0–99)
TRIGLYCERIDES: 42 mg/dL (ref <150)
VLDL: 8 mg/dL (ref 0–40)

## 2018-05-08 LAB — BASIC METABOLIC PANEL
ANION GAP: 8 (ref 5–15)
BUN: 19 mg/dL (ref 6–20)
CHLORIDE: 105 mmol/L (ref 98–111)
CO2: 33 mmol/L — ABNORMAL HIGH (ref 22–32)
CREATININE: 1.45 mg/dL — AB (ref 0.61–1.24)
Calcium: 8.9 mg/dL (ref 8.9–10.3)
GFR calc non Af Amer: 51 mL/min — ABNORMAL LOW (ref >60)
GFR, EST AFRICAN AMERICAN: 59 mL/min — AB (ref >60)
Glucose, Bld: 106 mg/dL — ABNORMAL HIGH (ref 70–99)
POTASSIUM: 4 mmol/L (ref 3.5–5.1)
Sodium: 146 mmol/L — ABNORMAL HIGH (ref 135–145)

## 2018-05-08 LAB — HIV ANTIBODY (ROUTINE TESTING W REFLEX): HIV Screen 4th Generation wRfx: NONREACTIVE

## 2018-05-08 LAB — HEPARIN LEVEL (UNFRACTIONATED)
HEPARIN UNFRACTIONATED: 0.18 [IU]/mL — AB (ref 0.30–0.70)
Heparin Unfractionated: 0.28 IU/mL — ABNORMAL LOW (ref 0.30–0.70)

## 2018-05-08 LAB — TROPONIN I
TROPONIN I: 0.12 ng/mL — AB (ref <0.03)
Troponin I: 0.11 ng/mL (ref <0.03)

## 2018-05-08 LAB — HEMOGLOBIN A1C
HEMOGLOBIN A1C: 7.3 % — AB (ref 4.8–5.6)
Mean Plasma Glucose: 162.81 mg/dL

## 2018-05-08 LAB — MRSA PCR SCREENING: MRSA BY PCR: NEGATIVE

## 2018-05-08 LAB — TSH: TSH: 2.202 u[IU]/mL (ref 0.350–4.500)

## 2018-05-08 MED ORDER — HEPARIN BOLUS VIA INFUSION
1500.0000 [IU] | Freq: Once | INTRAVENOUS | Status: AC
  Administered 2018-05-08: 1500 [IU] via INTRAVENOUS
  Filled 2018-05-08: qty 1500

## 2018-05-08 MED ORDER — HEPARIN BOLUS VIA INFUSION
3000.0000 [IU] | Freq: Once | INTRAVENOUS | Status: AC
  Administered 2018-05-08: 3000 [IU] via INTRAVENOUS
  Filled 2018-05-08: qty 3000

## 2018-05-08 NOTE — Progress Notes (Signed)
Patient placed on CPAP and tolerating well at this time. Patient has high anxiety and may not be able to tolerate all nigth. Will call if any issues arise.

## 2018-05-08 NOTE — Progress Notes (Signed)
[facility] PROGRESS NOTE  Mark [name] ULA:[id number] DOB: September 16, 1960 DOA: 05/07/2018  PCP: Dorothyann Peng, MD  Brief History/Interval Summary: 57 y.o. male with medical history significant of hypertension, hyperlipidemia, diabetes mellitus, obesity, OSA not on CPAP, left ear hearing loss, CKD-3, left bundle blockage, who presented with shortness of breath and bilateral leg edema.  Patient was thought to have congestive heart failure.  He was noted to have elevated troponin.  Cardiology was consulted.  Patient was hospitalized.  Reason for Visit: Acute diastolic CHF  Consultants: Cardiology  Procedures: Echocardiogram has been ordered.  Antibiotics: None  Subjective/Interval History: Patient states that he is feeling better this morning.  Still short of breath.  Denies any chest pain.  No nausea vomiting.  ROS: Denies any headaches  Objective:  Vital Signs  Vitals:   05/08/18 0500 05/08/18 0520 05/08/18 0535 05/08/18 0555  BP: (!) 133/92 (!) 137/93 (!) 141/85 130/86  Pulse: 85 88 85 91  Resp: (!) 0 15 (!) 9 12  Temp:      TempSrc:      SpO2: 98% 94% 95% 95%  Weight:      Height:        Intake/Output Summary (Last 24 hours) at 05/08/2018 1029 Last data filed at 05/08/2018 1005 Gross per 24 hour  Intake 193.88 ml  Output 2350 ml  Net -2156.12 ml   Filed Weights   05/07/18 1812 05/08/18 0320  Weight: (!) 143.8 kg (!) 195.5 kg    General appearance: alert, cooperative, appears stated age and no distress Head: Normocephalic, without obvious abnormality, atraumatic Resp: Tachypneic at rest.  No use of accessory muscle.  Decreased air entry at the bases with crackles bilaterally.  No wheezing or rhonchi. Cardio: regular rate and rhythm, S1, S2 normal, no murmur, click, rub or gallop GI: soft, non-tender; bowel sounds normal; no masses,  no organomegaly Extremities: edema 2+ edema noted bilateral lower extremities Pulses: 2+ and symmetric Neurologic: No  obvious focal neurological deficits.  He is alert and oriented x3  Lab Results:  Data Reviewed: I have personally reviewed following labs and imaging studies  CBC: Recent Labs  Lab 05/07/18 1823  WBC 5.8  HGB 14.5  HCT 50.4  MCV 96.9  PLT 236    Basic Metabolic Panel: Recent Labs  Lab 05/07/18 1823 05/08/18 0520  NA 144 146*  K 4.1 4.0  CL 106 105  CO2 27 33*  GLUCOSE 93 106*  BUN 20 19  CREATININE 1.44* 1.45*  CALCIUM 8.9 8.9    GFR: Estimated Creatinine Clearance: 95.7 mL/min (A) (by C-G formula based on SCr of 1.45 mg/dL (H)).  Liver Function Tests: Recent Labs  Lab 05/08/18 0520  AST 23  ALT 24  ALKPHOS 102  BILITOT 1.2  PROT 5.9*  ALBUMIN 3.0*    Cardiac Enzymes: Recent Labs  Lab 05/07/18 1823 05/07/18 2201 05/08/18 0520  TROPONINI 0.13* 0.13* 0.12*    CBG: Recent Labs  Lab 05/08/18 0738  GLUCAP 102*    Lipid Profile: Recent Labs    05/08/18 0520  CHOL 103  HDL 37*  LDLCALC 58  TRIG 42  CHOLHDL 2.8    Thyroid Function Tests: Recent Labs    05/08/18 0520  TSH 2.202     Recent Results (from the past 240 hour(s))  MRSA PCR Screening     Status: None   Collection Time: 05/07/18 11:40 PM  Result Value Ref Range Status   MRSA by PCR NEGATIVE NEGATIVE Final  Comment:        The GeneXpert MRSA Assay (FDA approved for NASAL specimens only), is one component of a comprehensive MRSA colonization surveillance program. It is not intended to diagnose MRSA infection nor to guide or monitor treatment for MRSA infections. Performed at [name], [address], [place], [place] [address]       Radiology Studies: Dg Chest 2 View  Result Date: 05/07/2018 CLINICAL DATA:  Shortness of breath. Hypoxia. Fluid retention with bilateral lower extremity swelling for 2 months. EXAM: CHEST - 2 VIEW COMPARISON:  None. FINDINGS: Moderate cardiomegaly. Pulmonary vascular congestion is noted, however there is no evidence of frank  pulmonary edema or pleural effusion. Lordotic positioning noted. IMPRESSION: Cardiomegaly and pulmonary vascular congestion. No evidence of frank pulmonary edema or consolidation. Electronically Signed   By: Myles Rosenthal M.D.   On: 05/07/2018 20:04     Medications:  Scheduled: . aspirin EC  81 mg Oral Daily  . atorvastatin  20 mg Oral Daily  . carvedilol  6.25 mg Oral BID WC  . furosemide  40 mg Intravenous Q12H  . gabapentin  100 mg Oral QHS  . losartan  25 mg Oral Daily  . sodium chloride flush  3 mL Intravenous Q12H   Continuous: . sodium chloride 10 mL/hr at 05/08/18 0400  . heparin 1,600 Units/hr (05/08/18 0647)  . nitroGLYCERIN 25 mcg/min (05/08/18 0400)   UEA:VWUJWJ chloride, acetaminophen, ALPRAZolam, hydrALAZINE, hydrOXYzine, morphine injection, sodium chloride flush, zolpidem  Assessment/Plan:    Acute congestive heart failure unknown type No previous echocardiogram reports available.  Continue with intravenous diuretics.  Echocardiogram has been ordered.  Patient has been placed on ARB per cardiology recommendations.  He is on carvedilol statin.  Strict ins and outs.  Daily weights.  TSH is normal.  Urine drug screen unremarkable.  Acute respiratory failure with hypoxia  Likely secondary to CHF.  Continue oxygen supplementation.  Monitor closely.  Should improve as he is diuresed.  Possible NSTEMI/LBBB Could also be demand ischemia.  He is on aspirin heparin and he is also nitroglycerin.  Is on beta-blocker.  Cardiology is following.  He is on statin.  LDL is 58.  Unclear if he has previous history of CAD.  Cardiology to consider ischemic work-up.  Hypertensive urgency Blood pressures have improved.  He is on nitroglycerin infusion.  Continue current medications for now.  Monitor closely.  Hyperlipidemia Continue statin  Obstructive sleep apnea CPAP  Chronic kidney disease stage III The last labs in our system are from 2013 when his creatinine was 0.98.   Creatinine 1.44 at admission.  Stable this morning.  Monitor urine output.  Continue to monitor labs while he is getting diuresed.  Consider renal ultrasound but not needed urgently.  DVT Prophylaxis: Currently on IV heparin    Code Status: Full code Family Communication: Discussed with the patient Disposition Plan: Management as outlined above.  Await further cardiology input.    LOS: 1 day   Osvaldo Shipper  [facility] Pager [phone] 05/08/2018, 10:29 AM  If 7PM-7AM, please contact night-coverage at [email or web address], password [facility]

## 2018-05-08 NOTE — Progress Notes (Signed)
Increased O2 to 6L at this time. RCP will continue to follow. Patient tolerating well, no distress noted.

## 2018-05-08 NOTE — Progress Notes (Addendum)
Placed patient on CPAP at this time per MD order, no distress noted at this time, RCP will continue to monitor. 4L O2 bled I, no distress noted at this time tolerating well.

## 2018-05-08 NOTE — Progress Notes (Signed)
Progress Note  Patient Name: Mark [name] Date of Encounter: 05/08/2018  Primary Cardiologist:   No primary care provider on file.   Subjective   Breathing better but not at baseline.   Inpatient Medications    Scheduled Meds: . aspirin EC  81 mg Oral Daily  . atorvastatin  20 mg Oral Daily  . carvedilol  6.25 mg Oral BID WC  . furosemide  40 mg Intravenous Q12H  . gabapentin  100 mg Oral QHS  . losartan  25 mg Oral Daily  . sodium chloride flush  3 mL Intravenous Q12H   Continuous Infusions: . sodium chloride 10 mL/hr at 05/08/18 0400  . heparin 1,600 Units/hr (05/08/18 0647)  . nitroGLYCERIN 25 mcg/min (05/08/18 0400)   PRN Meds: sodium chloride, acetaminophen, ALPRAZolam, hydrALAZINE, hydrOXYzine, morphine injection, sodium chloride flush, zolpidem   Vital Signs    Vitals:   05/08/18 0500 05/08/18 0520 05/08/18 0535 05/08/18 0555  BP: (!) 133/92 (!) 137/93 (!) 141/85 130/86  Pulse: 85 88 85 91  Resp: (!) 0 15 (!) 9 12  Temp:      TempSrc:      SpO2: 98% 94% 95% 95%  Weight:      Height:        Intake/Output Summary (Last 24 hours) at 05/08/2018 1059 Last data filed at 05/08/2018 1005 Gross per 24 hour  Intake 193.88 ml  Output 2350 ml  Net -2156.12 ml   Filed Weights   05/07/18 1812 05/08/18 0320  Weight: (!) 143.8 kg (!) 195.5 kg    Telemetry    NSR - Personally Reviewed  ECG    NA - Personally Reviewed  Physical Exam   GEN: No acute distress.   Neck: No  JVD Cardiac: RRR, no murmurs, rubs, or gallops.  Respiratory: Clear  to auscultation bilaterally. GI: Soft, nontender, non-distended  MS:    Severe diffuse edema; No deformity. Neuro:  Nonfocal  Psych: Normal affect   Labs    Chemistry Recent Labs  Lab 05/07/18 1823 05/08/18 0520  NA 144 146*  K 4.1 4.0  CL 106 105  CO2 27 33*  GLUCOSE 93 106*  BUN 20 19  CREATININE 1.44* 1.45*  CALCIUM 8.9 8.9  PROT  --  5.9*  ALBUMIN  --  3.0*  AST  --  23  ALT  --  24  ALKPHOS   --  102  BILITOT  --  1.2  GFRNONAA 52* 51*  GFRAA 60* 59*  ANIONGAP 11 8     Hematology Recent Labs  Lab 05/07/18 1823  WBC 5.8  RBC 5.20  HGB 14.5  HCT 50.4  MCV 96.9  MCH 27.9  MCHC 28.8*  RDW 15.7*  PLT 236    Cardiac Enzymes Recent Labs  Lab 05/07/18 1823 05/07/18 2201 05/08/18 0520  TROPONINI 0.13* 0.13* 0.12*   No results for input(s): TROPIPOC in the last 168 hours.   BNP Recent Labs  Lab 05/07/18 1823  BNP 654.7*     DDimer No results for input(s): DDIMER in the last 168 hours.   Radiology    Dg Chest 2 View  Result Date: 05/07/2018 CLINICAL DATA:  Shortness of breath. Hypoxia. Fluid retention with bilateral lower extremity swelling for 2 months. EXAM: CHEST - 2 VIEW COMPARISON:  None. FINDINGS: Moderate cardiomegaly. Pulmonary vascular congestion is noted, however there is no evidence of frank pulmonary edema or pleural effusion. Lordotic positioning noted. IMPRESSION: Cardiomegaly and pulmonary vascular congestion. No evidence  of frank pulmonary edema or consolidation. Electronically Signed   By: Myles Rosenthal M.D.   On: 05/07/2018 20:04    Cardiac Studies   NA  Patient Profile     57 y.o. male with morbid obesity, type 2 diabetes mellitus, hypertension, hyperlipidemia, medical noncompliance and obstructive sleep apnea who has chronic lower extremity edema.  We were asked to evaluate for acute on chronic HF.   Assessment & Plan    ACUTE HF:  EF assessment pending.  Good UO since admission.  Net negative 2.6 liters.   Continue IV diuresis.  Good response to low dose Lasix.  OK to continue.   HTN:   BP is controlled.  Conitnue IV NTG for now.  Probably titrate beta blocker and ARB in the morning.    LBBB:  New.  Start with echocardiogram.   ELEVATED TROPONIN:   Troponin trend is flat.  Possibly secondary to hypertensive urgency.  Echo pending.  On heparin.  BP controlled.  I suspect a non ischemic evaluation would be appropriate pending the echo  results.      For questions or updates, please contact [facility] Please consult [email or web address] for contact info under Cardiology/STEMI.   Signed, Rollene Rotunda, MD  05/08/2018, 10:59 AM

## 2018-05-08 NOTE — Progress Notes (Signed)
Patient has severe obstructive sleep apnea and was starting to desat on O2 via N/C, attempted to titrate up without success, placed on Venti-mask at highest O2 flow and still only satting mid 80's to 90%, called Respiratory therapist to come see patient and let him know that he would need a CPAP machine and I would call the MD to get the order and inform of patient's condition. Pt is on a NTG drip and will titrate to keep SBP less than 160 and Diastolic less than 100 and will also start a Heparin drip as per order and continue to monitor.

## 2018-05-08 NOTE — Progress Notes (Signed)
Patient easily agitated with mask at this time, adjusted to patients needs and increased O2 to 10L.

## 2018-05-08 NOTE — Progress Notes (Signed)
ANTICOAGULATION CONSULT NOTE - Initial Consult  Pharmacy Consult for heparin Indication: chest pain/ACS  No Known Allergies  Patient Measurements: Height: 5\' 11"  (180.3 cm) Weight: (!) 431 lb (195.5 kg) IBW/kg (Calculated) : 75.3 Heparin Dosing Weight: 109 kg  Vital Signs: BP: 151/104 (09/08 1611) Pulse Rate: 97 (09/08 1611)  Labs: Recent Labs    05/07/18 1823 05/07/18 2201 05/08/18 0520 05/08/18 1055 05/08/18 1559  HGB 14.5  --   --   --   --   HCT 50.4  --   --   --   --   PLT 236  --   --   --   --   HEPARINUNFRC  --   --  0.18*  --  0.28*  CREATININE 1.44*  --  1.45*  --   --   TROPONINI 0.13* 0.13* 0.12* 0.11*  --     Estimated Creatinine Clearance: 95.7 mL/min (A) (by C-G formula based on SCr of 1.45 mg/dL (H)).   Medical History: Past Medical History:  Diagnosis Date  . Diabetes mellitus without complication (HCC)   . Hearing impaired   . Hypertension   . Sleep apnea     Medications:  Medications Prior to Admission  Medication Sig Dispense Refill Last Dose  . atorvastatin (LIPITOR) 20 MG tablet Take 20 mg by mouth daily.   05/07/2018 at Unknown time  . carvedilol (COREG) 6.25 MG tablet Take 6.25 mg by mouth 2 (two) times daily with a meal.   05/07/2018 at 0500  . furosemide (LASIX) 20 MG tablet Take 20 mg by mouth 2 (two) times daily.  2 05/07/2018 at Unknown time  . gabapentin (NEURONTIN) 100 MG capsule Take 1 capsule (100 mg total) by mouth at bedtime. 90 capsule 0 05/06/2018 at Unknown time  . metFORMIN (GLUCOPHAGE) 500 MG tablet Take 500 mg by mouth 2 (two) times daily with a meal.   05/07/2018 at Unknown time    Assessment: 57 yo man with CP to start heparin when SBP <150.  He was not on anticoagulation PTA Goal of Therapy:  Heparin level 0.3-0.7 units/ml Monitor platelets by anticoagulation protocol: Yes   Plan:  Heparin bolus 1500 units and increase drip to 1800 units/hr Check heparin level in 6-8 hours  Daily HL and CBC while on heparin  Caterin Tabares  A. Jeanella Craze, PharmD, BCPS Clinical Pharmacist [place] Pager: [phone] Please utilize Amion for appropriate phone number to reach the unit pharmacist [facility])   05/08/2018,5:07 PM

## 2018-05-08 NOTE — Progress Notes (Deleted)
ANTICOAGULATION CONSULT NOTE - Initial Consult  Pharmacy Consult for heparin Indication: chest pain/ACS  No Known Allergies  Patient Measurements: Height: 5\' 11"  (180.3 cm) Weight: (Abnormal) 431 lb (195.5 kg) IBW/kg (Calculated) : 75.3 Heparin Dosing Weight: 109 kg  Vital Signs: BP: 151/104 (09/08 1611) Pulse Rate: 97 (09/08 1611)  Labs: Recent Labs    05/07/18 1823 05/07/18 2201 05/08/18 0520 05/08/18 1055 05/08/18 1559  HGB 14.5  --   --   --   --   HCT 50.4  --   --   --   --   PLT 236  --   --   --   --   HEPARINUNFRC  --   --  0.18*  --  0.28*  CREATININE 1.44*  --  1.45*  --   --   TROPONINI 0.13* 0.13* 0.12* 0.11*  --     Estimated Creatinine Clearance: 95.7 mL/min (A) (by C-G formula based on SCr of 1.45 mg/dL (H)).   Medical History: Past Medical History:  Diagnosis Date  . Diabetes mellitus without complication (HCC)   . Hearing impaired   . Hypertension   . Sleep apnea     Medications:  Medications Prior to Admission  Medication Sig Dispense Refill Last Dose  . atorvastatin (LIPITOR) 20 MG tablet Take 20 mg by mouth daily.   05/07/2018 at Unknown time  . carvedilol (COREG) 6.25 MG tablet Take 6.25 mg by mouth 2 (two) times daily with a meal.   05/07/2018 at 0500  . furosemide (LASIX) 20 MG tablet Take 20 mg by mouth 2 (two) times daily.  2 05/07/2018 at Unknown time  . gabapentin (NEURONTIN) 100 MG capsule Take 1 capsule (100 mg total) by mouth at bedtime. 90 capsule 0 05/06/2018 at Unknown time  . metFORMIN (GLUCOPHAGE) 500 MG tablet Take 500 mg by mouth 2 (two) times daily with a meal.   05/07/2018 at Unknown time    Assessment: 57 yo man with CP to start heparin when SBP <150.  He was not on anticoagulation PTA  Heparin level 0.28, no infusion issues or overt bleeding reported  Goal of Therapy:  Heparin level 0.3-0.7 units/ml Monitor platelets by anticoagulation protocol: Yes   Plan:  Increase heparin gtt to 1800 units/hr Daily HL and CBC while  on heparin F/u cardiology plan   Ruben Im, PharmD Clinical Pharmacist 05/08/2018 5:09 PM Please check AMION for all [name] Pharmacy numbers

## 2018-05-09 ENCOUNTER — Inpatient Hospital Stay (HOSPITAL_COMMUNITY): Payer: Medicare Other

## 2018-05-09 ENCOUNTER — Other Ambulatory Visit: Payer: Self-pay

## 2018-05-09 ENCOUNTER — Inpatient Hospital Stay (HOSPITAL_COMMUNITY): Payer: Managed Care, Other (non HMO)

## 2018-05-09 DIAGNOSIS — I447 Left bundle-branch block, unspecified: Secondary | ICD-10-CM

## 2018-05-09 DIAGNOSIS — I517 Cardiomegaly: Secondary | ICD-10-CM

## 2018-05-09 LAB — ECHOCARDIOGRAM COMPLETE
Height: 71 in
Weight: 6878.35 oz

## 2018-05-09 LAB — BASIC METABOLIC PANEL
Anion gap: 12 (ref 5–15)
BUN: 21 mg/dL — AB (ref 6–20)
CO2: 31 mmol/L (ref 22–32)
CREATININE: 1.4 mg/dL — AB (ref 0.61–1.24)
Calcium: 8.7 mg/dL — ABNORMAL LOW (ref 8.9–10.3)
Chloride: 102 mmol/L (ref 98–111)
GFR calc Af Amer: >60 mL/min (ref >60)
GFR calc non Af Amer: 54 mL/min — ABNORMAL LOW (ref >60)
Glucose, Bld: 126 mg/dL — ABNORMAL HIGH (ref 70–99)
POTASSIUM: 4.6 mmol/L (ref 3.5–5.1)
SODIUM: 145 mmol/L (ref 135–145)

## 2018-05-09 LAB — CBC
HEMATOCRIT: 48.9 % (ref 39.0–52.0)
Hemoglobin: 13.9 g/dL (ref 13.0–17.0)
MCH: 28 pg (ref 26.0–34.0)
MCHC: 28.4 g/dL — ABNORMAL LOW (ref 30.0–36.0)
MCV: 98.6 fL (ref 78.0–100.0)
PLATELETS: 219 10*3/uL (ref 150–400)
RBC: 4.96 MIL/uL (ref 4.22–5.81)
RDW: 15.9 % — AB (ref 11.5–15.5)
WBC: 6.5 10*3/uL (ref 4.0–10.5)

## 2018-05-09 LAB — HEPARIN LEVEL (UNFRACTIONATED)
HEPARIN UNFRACTIONATED: 0.83 [IU]/mL — AB (ref 0.30–0.70)
Heparin Unfractionated: 0.19 IU/mL — ABNORMAL LOW (ref 0.30–0.70)

## 2018-05-09 MED ORDER — HEPARIN BOLUS VIA INFUSION
3000.0000 [IU] | Freq: Once | INTRAVENOUS | Status: AC
  Administered 2018-05-09: 3000 [IU] via INTRAVENOUS
  Filled 2018-05-09: qty 3000

## 2018-05-09 NOTE — Progress Notes (Signed)
RT added O2 adapter to dream station for pt oxygenation. Bled in 4lpm. Pt resting comfortably. RT will continue to monitor.

## 2018-05-09 NOTE — Progress Notes (Signed)
  Echocardiogram 2D Echocardiogram has been performed.  Mark [name] 05/09/2018, 3:04 PM

## 2018-05-09 NOTE — Evaluation (Signed)
Occupational Therapy Evaluation Patient Details Name: Mark [name] MRN: [id number] DOB: [birth year] Today's Date: 05/09/2018    History of Present Illness Mark [name] is a 57 y.o. male with medical history significant of hypertension, hyperlipidemia, diabetes mellitus, obesity, OSA not on CPAP, left ear hearing loss, CKD-3, left bundle blockage, who presents with shortness of breath and bilateral leg edema. found to be in acute CHF   Clinical Impression   This 57 yo male admitted with above presents to acute OT with mild decreased balance and decreased endurance for activity when up on his feet thus affecting his safety and independence with basic ADLs and working (barber/beautician). He will benefit from acute OT without need for follow up. Pt kept on 3 liters of O2 during session due to only sating 90-92% on the 3 liters with activity.     Follow Up Recommendations  No OT follow up;Supervision - Intermittent    Equipment Recommendations  None recommended by OT       Precautions / Restrictions Precautions Precaution Comments: monitor O2 Restrictions Weight Bearing Restrictions: No      Mobility Bed Mobility               General bed mobility comments: Pt sitting on EOB  upon arrival  Transfers Overall transfer level: Needs assistance Equipment used: None Transfers: Sit to/from Stand Sit to Stand: Min guard              Balance Overall balance assessment: No apparent balance deficits (not formally assessed)                                         ADL either performed or assessed with clinical judgement   ADL Overall ADL's : Needs assistance/impaired Eating/Feeding: Independent;Sitting Eating/Feeding Details (indicate cue type and reason): EOB Grooming: Min guard;Standing   Upper Body Bathing: Set up;Sitting Upper Body Bathing Details (indicate cue type and reason): EOB Lower Body Bathing: Min guard;Sit to/from stand   Upper Body  Dressing : Set up;Sitting Upper Body Dressing Details (indicate cue type and reason): EOB Lower Body Dressing: Min guard;Sit to/from stand   Toilet Transfer: Min guard;Ambulation;Regular Toilet;Grab bars   Toileting- Clothing Manipulation and Hygiene: Min guard;Sit to/from stand         General ADL Comments: pt did not report feeling SOB with up to bathroo, sitting on toilet ~5 minutes and then out to recliner     Vision Patient Visual Report: No change from baseline              Pertinent Vitals/Pain Pain Assessment: No/denies pain     Hand Dominance Right   Extremity/Trunk Assessment Upper Extremity Assessment Upper Extremity Assessment: Overall WFL for tasks assessed   Lower Extremity Assessment Lower Extremity Assessment: Defer to PT evaluation       Communication Communication Communication: No difficulties   Cognition Arousal/Alertness: Awake/alert Behavior During Therapy: WFL for tasks assessed/performed Overall Cognitive Status: Within Functional Limits for tasks assessed                                                Home Living Family/patient expects to be discharged to:: Private residence Living Arrangements: Spouse/significant other Available Help at Discharge: Family;Available PRN/intermittently Type of Home: House  Bathroom Shower/Tub: Dispensing optician: None          Prior Functioning/Environment Level of Independence: Independent        Comments: works as barber/beautician near [employer]        OT Problem List: Cardiopulmonary status limiting activity      OT Treatment/Interventions: Restaurant manager, fast food;Energy conservation    OT Goals(Current goals can be found in the care plan section) Acute Rehab OT Goals Patient Stated Goal: to get back to work OT Goal Formulation: With patient Time For Goal Achievement: 05/16/18 Potential to Achieve  Goals: Good  OT Frequency: Min 2X/week              AM-PAC PT "6 Clicks" Daily Activity     Outcome Measure Help from another person eating meals?: None Help from another person taking care of personal grooming?: A Little Help from another person toileting, which includes using toliet, bedpan, or urinal?: A Little Help from another person bathing (including washing, rinsing, drying)?: A Little Help from another person to put on and taking off regular upper body clothing?: A Little Help from another person to put on and taking off regular lower body clothing?: A Little 6 Click Score: 19   End of Session Equipment Utilized During Treatment: (O2 at 3 liters) Nurse Communication: Mobility status  Activity Tolerance: Patient tolerated treatment well Patient left: in chair;with call bell/phone within reach  OT Visit Diagnosis: Unsteadiness on feet (R26.81)                Time: 1505-6979 OT Time Calculation (min): 32 min Charges:  OT General Charges $OT Visit: 1 Visit OT Evaluation $OT Eval Moderate Complexity: 1 Mod OT Treatments $Self Care/Home Management : 8-22 mins  Ignacia Palma, OTR/L Acute [employer] Pager [phone] Office [phone]

## 2018-05-09 NOTE — Progress Notes (Signed)
ANTICOAGULATION CONSULT NOTE - Follow Up Consult  Pharmacy Consult for heparin Indication: chest pain/ACS  Labs: Recent Labs    05/07/18 1823 05/07/18 2201  05/08/18 0520 05/08/18 1055 05/08/18 1559 05/08/18 2311 05/09/18 0013 05/09/18 0738  HGB 14.5  --   --   --   --   --   --  13.9  --   HCT 50.4  --   --   --   --   --   --  48.9  --   PLT 236  --   --   --   --   --   --  219  --   HEPARINUNFRC  --   --    < > 0.18*  --  0.28* 0.19*  --  0.83*  CREATININE 1.44*  --   --  1.45*  --   --   --  1.40*  --   TROPONINI 0.13* 0.13*  --  0.12* 0.11*  --   --   --   --    < > = values in this interval not displayed.    Assessment: 57yo male subtherapeutic on heparin  Heparin level now 0.83  Goal of Therapy:  Heparin level 0.3-0.7 units/ml   Plan:  Decrease heparin to 2000 units / hr Follow up plans for heparin Daily heparin level, CBC  Thank you Okey Regal, PharmD [phone]     05/09/2018,8:32 AM

## 2018-05-09 NOTE — Progress Notes (Signed)
[facility] PROGRESS NOTE  Mark [name] XNT:[id number] DOB: 02-23-1961 DOA: 05/07/2018  PCP: Dorothyann Peng, MD  Brief History/Interval Summary: 57 y.o. male with medical history significant of hypertension, hyperlipidemia, diabetes mellitus, obesity, OSA not on CPAP, left ear hearing loss, CKD-3, left bundle blockage, who presented with shortness of breath and bilateral leg edema.  Patient was thought to have congestive heart failure.  He was noted to have elevated troponin.  Cardiology was consulted.  Patient was hospitalized.  Reason for Visit: Acute diastolic CHF  Consultants: Cardiology  Procedures: Echocardiogram has been ordered.  Antibiotics: None  Subjective/Interval History: Denies any chest pain.  Shortness of breath has improved.  No nausea vomiting.  Had multiple questions regarding his work-up.    ROS: Denies any headaches.  Objective:  Vital Signs  Vitals:   05/09/18 0515 05/09/18 0530 05/09/18 0555 05/09/18 0800  BP: 125/83 131/82 (!) 142/91 (!) 132/95  Pulse: 79 81 87 85  Resp: 19 18 (!) 22 12  Temp:      TempSrc:      SpO2: 94% 94% 96% 94%  Weight:      Height:        Intake/Output Summary (Last 24 hours) at 05/09/2018 0954 Last data filed at 05/09/2018 0700 Gross per 24 hour  Intake 1370.21 ml  Output 3925 ml  Net -2554.79 ml   Filed Weights   05/07/18 1812 05/08/18 0320 05/08/18 2316  Weight: (!) 143.8 kg (!) 195.5 kg (!) 195 kg    General appearance: Alert.  In no distress.  Morbidly obese. Resp: Tachypnea has improved.  No use of accessory muscles.  Decreased air entry at the bases with crackles bilaterally.  No wheezing or rhonchi.   Cardio: S1-S2 normal regular.  No S3-S4.  No rubs murmurs or bruit GI: Abdomen remains soft.  Obese.  Nontender nondistended.  Bowel sounds are present normal.  No masses organomegaly Extremities: Edema is improving though remains 2+. Neurologic: Alert and oriented x3.  No obvious focal neurological  deficits.  Lab Results:  Data Reviewed: I have personally reviewed following labs and imaging studies  CBC: Recent Labs  Lab 05/07/18 1823 05/09/18 0013  WBC 5.8 6.5  HGB 14.5 13.9  HCT 50.4 48.9  MCV 96.9 98.6  PLT 236 219    Basic Metabolic Panel: Recent Labs  Lab 05/07/18 1823 05/08/18 0520 05/09/18 0013  NA 144 146* 145  K 4.1 4.0 4.6  CL 106 105 102  CO2 27 33* 31  GLUCOSE 93 106* 126*  BUN 20 19 21*  CREATININE 1.44* 1.45* 1.40*  CALCIUM 8.9 8.9 8.7*    GFR: Estimated Creatinine Clearance: 99 mL/min (A) (by C-G formula based on SCr of 1.4 mg/dL (H)).  Liver Function Tests: Recent Labs  Lab 05/08/18 0520  AST 23  ALT 24  ALKPHOS 102  BILITOT 1.2  PROT 5.9*  ALBUMIN 3.0*    Cardiac Enzymes: Recent Labs  Lab 05/07/18 1823 05/07/18 2201 05/08/18 0520 05/08/18 1055  TROPONINI 0.13* 0.13* 0.12* 0.11*    CBG: Recent Labs  Lab 05/08/18 0738 05/08/18 1142 05/08/18 1610 05/08/18 2013  GLUCAP 102* 75 84 140*    Lipid Profile: Recent Labs    05/08/18 0520  CHOL 103  HDL 37*  LDLCALC 58  TRIG 42  CHOLHDL 2.8    Thyroid Function Tests: Recent Labs    05/08/18 0520  TSH 2.202     Recent Results (from the past 240 hour(s))  MRSA PCR Screening  Status: None   Collection Time: 05/07/18 11:40 PM  Result Value Ref Range Status   MRSA by PCR NEGATIVE NEGATIVE Final    Comment:        The GeneXpert MRSA Assay (FDA approved for NASAL specimens only), is one component of a comprehensive MRSA colonization surveillance program. It is not intended to diagnose MRSA infection nor to guide or monitor treatment for MRSA infections. Performed at [facility], [address], [place], [place] [address]       Radiology Studies: Dg Chest 2 View  Result Date: 05/07/2018 CLINICAL DATA:  Shortness of breath. Hypoxia. Fluid retention with bilateral lower extremity swelling for 2 months. EXAM: CHEST - 2 VIEW COMPARISON:  None.  FINDINGS: Moderate cardiomegaly. Pulmonary vascular congestion is noted, however there is no evidence of frank pulmonary edema or pleural effusion. Lordotic positioning noted. IMPRESSION: Cardiomegaly and pulmonary vascular congestion. No evidence of frank pulmonary edema or consolidation. Electronically Signed   By: Myles Rosenthal M.D.   On: 05/07/2018 20:04     Medications:  Scheduled: . aspirin EC  81 mg Oral Daily  . atorvastatin  20 mg Oral Daily  . carvedilol  6.25 mg Oral BID WC  . furosemide  40 mg Intravenous Q12H  . gabapentin  100 mg Oral QHS  . losartan  25 mg Oral Daily  . sodium chloride flush  3 mL Intravenous Q12H   Continuous: . sodium chloride 10 mL/hr at 05/09/18 0600  . heparin 2,000 Units/hr (05/09/18 0901)  . nitroGLYCERIN 10 mcg/min (05/09/18 0901)   UEA:VWUJWJ chloride, acetaminophen, ALPRAZolam, hydrALAZINE, hydrOXYzine, morphine injection, sodium chloride flush, zolpidem  Assessment/Plan:    Acute congestive heart failure unknown type No previous echocardiogram reports available.  Patient remains on intravenous diuretics.  Continue strict ins and outs and daily weights.  He appears to be diuresing.  Weight measurements not consistent.  Echocardiogram is pending.  Patient has been placed on ARB and carvedilol.  He is also on statin.  TSH is normal.  Urine drug screen unremarkable.    Acute respiratory failure with hypoxia  Most likely secondary to CHF.  Continues to require oxygen supplementation.  Respiratory effort has improved.  Possible NSTEMI/LBBB Could also be demand ischemia.  He is on aspirin heparin and he is also nitroglycerin.  Also on beta-blocker.  Cardiology continues to follow.  He is on statin.  LDL is 58.  Patient denies any previous history of known CAD.  Cardiology to consider ischemic work-up.  Hypertensive urgency Blood pressures have improved.  Continue to titrate down nitroglycerin infusion.    Hyperlipidemia Continue  statin  Obstructive sleep apnea CPAP  Chronic kidney disease stage III The last labs in our system are from 2013 when his creatinine was 0.98.  Creatinine 1.44 at admission.  Stable.  Continue to monitor labs while he is getting diuresed.  Consider renal ultrasound but not needed urgently.  Morbid obesity Body mass index is 59.96 kg/m.  DVT Prophylaxis: Currently on IV heparin    Code Status: Full code Family Communication: Discussed with the patient Disposition Plan: Management as outlined above.  Continues to require IV diuretics.  Continues to require oxygen to maintain saturations.  Mobilize as tolerated.    LOS: 2 days   Osvaldo Shipper  [facility] Pager [phone] 05/09/2018, 9:54 AM  If 7PM-7AM, please contact night-coverage at [email or web address], password [facility]

## 2018-05-09 NOTE — Progress Notes (Signed)
Obtained patient's weight on standing scale at this time; weight is 327 lb. Documented this value in flowsheets.

## 2018-05-09 NOTE — Progress Notes (Signed)
ANTICOAGULATION CONSULT NOTE - Follow Up Consult  Pharmacy Consult for heparin Indication: chest pain/ACS  Labs: Recent Labs    05/07/18 1823 05/07/18 2201 05/08/18 0520 05/08/18 1055 05/08/18 1559 05/08/18 2311 05/09/18 0013  HGB 14.5  --   --   --   --   --  13.9  HCT 50.4  --   --   --   --   --  48.9  PLT 236  --   --   --   --   --  219  HEPARINUNFRC  --   --  0.18*  --  0.28* 0.19*  --   CREATININE 1.44*  --  1.45*  --   --   --  1.40*  TROPONINI 0.13* 0.13* 0.12* 0.11*  --   --   --     Assessment: 57yo male subtherapeutic on heparin with lower heparin level despite rate increase; RN reports no gtt issues.  Goal of Therapy:  Heparin level 0.3-0.7 units/ml   Plan:  Will rebolus with heparin 3000 units and increase heparin gtt by 3 units/kgABW/hr to 2200 units/hr and check level in 6 hours.    Vernard Gambles, PharmD, BCPS  05/09/2018,12:59 AM

## 2018-05-09 NOTE — Progress Notes (Signed)
Progress Note  Patient Name: Mark [name] Date of Encounter: 05/09/2018  Primary Cardiologist: New (Dr. Mayford Knife)  Subjective   Still SOB and requiring supplemental O2. Denies CP.   Inpatient Medications    Scheduled Meds: . aspirin EC  81 mg Oral Daily  . atorvastatin  20 mg Oral Daily  . carvedilol  6.25 mg Oral BID WC  . furosemide  40 mg Intravenous Q12H  . gabapentin  100 mg Oral QHS  . losartan  25 mg Oral Daily  . sodium chloride flush  3 mL Intravenous Q12H   Continuous Infusions: . sodium chloride 10 mL/hr at 05/09/18 0600  . heparin 2,000 Units/hr (05/09/18 0901)  . nitroGLYCERIN 5 mcg/min (05/09/18 0959)   PRN Meds: sodium chloride, acetaminophen, ALPRAZolam, hydrALAZINE, hydrOXYzine, morphine injection, sodium chloride flush, zolpidem   Vital Signs    Vitals:   05/09/18 0515 05/09/18 0530 05/09/18 0555 05/09/18 0800  BP: 125/83 131/82 (!) 142/91 (!) 132/95  Pulse: 79 81 87 85  Resp: 19 18 (!) 22 12  Temp:      TempSrc:      SpO2: 94% 94% 96% 94%  Weight:      Height:        Intake/Output Summary (Last 24 hours) at 05/09/2018 1011 Last data filed at 05/09/2018 0700 Gross per 24 hour  Intake 1370.21 ml  Output 3525 ml  Net -2154.79 ml   Filed Weights   05/07/18 1812 05/08/18 0320 05/08/18 2316  Weight: (!) 143.8 kg (!) 195.5 kg (!) 195 kg    Telemetry    SR, 80s - Personally Reviewed  ECG    NSR LBBB - Personally Reviewed  Physical Exam   GEN: Morbidly obese BM, No acute distress.   Neck: No JVD Cardiac: RRR, no murmurs, rubs, or gallops.  Respiratory: Clear to auscultation bilaterally. GI: obese abdomen, distended,  non tender MS: 1+ pretibial edema Neuro:  Nonfocal  Psych: Normal affect   Labs    Chemistry Recent Labs  Lab 05/07/18 1823 05/08/18 0520 05/09/18 0013  NA 144 146* 145  K 4.1 4.0 4.6  CL 106 105 102  CO2 27 33* 31  GLUCOSE 93 106* 126*  BUN 20 19 21*  CREATININE 1.44* 1.45* 1.40*  CALCIUM 8.9 8.9 8.7*    PROT  --  5.9*  --   ALBUMIN  --  3.0*  --   AST  --  23  --   ALT  --  24  --   ALKPHOS  --  102  --   BILITOT  --  1.2  --   GFRNONAA 52* 51* 54*  GFRAA 60* 59* >60  ANIONGAP 11 8 12      Hematology Recent Labs  Lab 05/07/18 1823 05/09/18 0013  WBC 5.8 6.5  RBC 5.20 4.96  HGB 14.5 13.9  HCT 50.4 48.9  MCV 96.9 98.6  MCH 27.9 28.0  MCHC 28.8* 28.4*  RDW 15.7* 15.9*  PLT 236 219    Cardiac Enzymes Recent Labs  Lab 05/07/18 1823 05/07/18 2201 05/08/18 0520 05/08/18 1055  TROPONINI 0.13* 0.13* 0.12* 0.11*   No results for input(s): TROPIPOC in the last 168 hours.   BNP Recent Labs  Lab 05/07/18 1823  BNP 654.7*     DDimer No results for input(s): DDIMER in the last 168 hours.   Radiology    Dg Chest 2 View  Result Date: 05/07/2018 CLINICAL DATA:  Shortness of breath. Hypoxia. Fluid retention with bilateral  lower extremity swelling for 2 months. EXAM: CHEST - 2 VIEW COMPARISON:  None. FINDINGS: Moderate cardiomegaly. Pulmonary vascular congestion is noted, however there is no evidence of frank pulmonary edema or pleural effusion. Lordotic positioning noted. IMPRESSION: Cardiomegaly and pulmonary vascular congestion. No evidence of frank pulmonary edema or consolidation. Electronically Signed   By: Myles Rosenthal M.D.   On: 05/07/2018 20:04    Cardiac Studies   2D Echo - pending   Patient Profile     57 y.o. male with morbid obesity, type 2 diabetes mellitus, hypertension, hyperlipidemia, medical noncompliance and obstructive sleep apnea who has chronic lower extremity edema.  We were asked to evaluate for acute on chronic HF.   Assessment & Plan    1. Acute CHF: in the setting of hypertensive urgency. BP has improved. Good diuresis with IV Lasix. -4.8 L out in UOP yesterday. Net I/Os 4.3 L since admit. Doubt weights are accurate. Will ask RN to recheck. Scr stable over the last 3 days at 1.4. K WNL at 4.6. He still has LEE on exam and still with supplemental  O2 requirements. His body habitus makes it a bit difficult to gauge volume. May consider RHC to help guide further diuresis. Will defer to MD. 2D echo pending. Once we transition to PO diuretic, we may need to consider torsemide instead of lasix for better GI absorption.   2. HTN: BP on admit was elevated in the 170s systolic and low 621H diastolic. History of medication noncompliance. BP taken at 0800 was 132/95. Continue Coreg and Losartan. There is room in HR for further titration of Coreg if needed.   3. Elevated Troponin: mildly elevated but flat trend, 0.13>>0.13>>0.12. This may be demand ischemia from hypertensive urgency and acute CHF. He denies CP. 2D echo pending to assess LVEF and wall motion. Will determine need for further evaluation based on echo findings.   4. LBBB: new since his last EKG from 2012. Ischemic w/ pending results of 2D echo.   For questions or updates, please contact [facility] Please consult [email or web address] for contact info under        Signed, Robbie Lis, PA-C  05/09/2018, 10:11 AM

## 2018-05-09 NOTE — Progress Notes (Signed)
RT placed pt on BIPAP for the night. Pt is using a dream station in BIPAP mode. RT will continue to monitor.

## 2018-05-10 LAB — CBC
HEMATOCRIT: 50.7 % (ref 39.0–52.0)
HEMOGLOBIN: 14.4 g/dL (ref 13.0–17.0)
MCH: 28 pg (ref 26.0–34.0)
MCHC: 28.4 g/dL — ABNORMAL LOW (ref 30.0–36.0)
MCV: 98.6 fL (ref 78.0–100.0)
Platelets: 210 10*3/uL (ref 150–400)
RBC: 5.14 MIL/uL (ref 4.22–5.81)
RDW: 15.7 % — AB (ref 11.5–15.5)
WBC: 5.2 10*3/uL (ref 4.0–10.5)

## 2018-05-10 LAB — BLOOD GAS, ARTERIAL
ACID-BASE EXCESS: 12 mmol/L — AB (ref 0.0–2.0)
BICARBONATE: 37.3 mmol/L — AB (ref 20.0–28.0)
DRAWN BY: 105521
O2 Content: 2 L/min
O2 SAT: 90.2 %
PATIENT TEMPERATURE: 98.6
PH ART: 7.398 (ref 7.350–7.450)
pCO2 arterial: 61.9 mmHg — ABNORMAL HIGH (ref 32.0–48.0)
pO2, Arterial: 59.7 mmHg — ABNORMAL LOW (ref 83.0–108.0)

## 2018-05-10 LAB — BASIC METABOLIC PANEL
ANION GAP: 11 (ref 5–15)
BUN: 13 mg/dL (ref 6–20)
CHLORIDE: 99 mmol/L (ref 98–111)
CO2: 34 mmol/L — ABNORMAL HIGH (ref 22–32)
Calcium: 8.9 mg/dL (ref 8.9–10.3)
Creatinine, Ser: 1.12 mg/dL (ref 0.61–1.24)
GFR calc Af Amer: >60 mL/min (ref >60)
GFR calc non Af Amer: >60 mL/min (ref >60)
Glucose, Bld: 120 mg/dL — ABNORMAL HIGH (ref 70–99)
POTASSIUM: 4.2 mmol/L (ref 3.5–5.1)
SODIUM: 144 mmol/L (ref 135–145)

## 2018-05-10 LAB — HEPARIN LEVEL (UNFRACTIONATED): Heparin Unfractionated: 0.71 IU/mL — ABNORMAL HIGH (ref 0.30–0.70)

## 2018-05-10 NOTE — Progress Notes (Signed)
Progress Note  Patient Name: Mark [name] Date of Encounter: 05/10/2018  Primary Cardiologist: Armanda Magic, MD   Subjective   Feeling better this morning. Out of bed and just finished walking with cardiac rehab. He states he did good w/o getting too SOB. Now with SL O2 off. O2 sats in the low 90s on RA. Denies CP.   Inpatient Medications    Scheduled Meds: . aspirin EC  81 mg Oral Daily  . atorvastatin  20 mg Oral Daily  . carvedilol  6.25 mg Oral BID WC  . furosemide  40 mg Intravenous Q12H  . gabapentin  100 mg Oral QHS  . losartan  25 mg Oral Daily  . sodium chloride flush  3 mL Intravenous Q12H   Continuous Infusions: . sodium chloride 10 mL/hr at 05/10/18 0645  . heparin 2,000 Units/hr (05/10/18 0645)  . nitroGLYCERIN Stopped (05/09/18 1046)   PRN Meds: sodium chloride, acetaminophen, ALPRAZolam, hydrALAZINE, hydrOXYzine, morphine injection, sodium chloride flush, zolpidem   Vital Signs    Vitals:   05/09/18 2021 05/09/18 2107 05/10/18 0629 05/10/18 0757  BP: 130/80  (!) 147/92 (!) 135/101  Pulse: 88 89 85 87  Resp: 20 20 17  (!) 21  Temp: 100 F (37.8 C)  99.6 F (37.6 C) 99.6 F (37.6 C)  TempSrc: Oral  Oral Oral  SpO2: 94% 95% 96% 95%  Weight:   (!) 147.5 kg   Height:        Intake/Output Summary (Last 24 hours) at 05/10/2018 1023 Last data filed at 05/10/2018 0800 Gross per 24 hour  Intake 1964.62 ml  Output 3775 ml  Net -1810.38 ml   Filed Weights   05/08/18 2316 05/09/18 1500 05/10/18 0629  Weight: (!) 195 kg (!) 148.3 kg (!) 147.5 kg    Telemetry    NSR- Personally Reviewed  ECG    NSR w/ new LBBB - Personally Reviewed  Physical Exam   GEN: morbidly obese AAM in NAD.  Neck: No JVD Cardiac: RRR, no murmurs, rubs, or gallops.  Respiratory: distant BS 2/2 body habitus, Clear to auscultation bilaterally. GI: Soft, nontender, non-distended  MS: tense bilateral LEE edema; 1+ to above level of the knee Neuro:  Nonfocal  Psych:  Normal affect   Labs    Chemistry Recent Labs  Lab 05/08/18 0520 05/09/18 0013 05/10/18 0706  NA 146* 145 144  K 4.0 4.6 4.2  CL 105 102 99  CO2 33* 31 34*  GLUCOSE 106* 126* 120*  BUN 19 21* 13  CREATININE 1.45* 1.40* 1.12  CALCIUM 8.9 8.7* 8.9  PROT 5.9*  --   --   ALBUMIN 3.0*  --   --   AST 23  --   --   ALT 24  --   --   ALKPHOS 102  --   --   BILITOT 1.2  --   --   GFRNONAA 51* 54* >60  GFRAA 59* >60 >60  ANIONGAP 8 12 11      Hematology Recent Labs  Lab 05/07/18 1823 05/09/18 0013 05/10/18 0706  WBC 5.8 6.5 5.2  RBC 5.20 4.96 5.14  HGB 14.5 13.9 14.4  HCT 50.4 48.9 50.7  MCV 96.9 98.6 98.6  MCH 27.9 28.0 28.0  MCHC 28.8* 28.4* 28.4*  RDW 15.7* 15.9* 15.7*  PLT 236 219 210    Cardiac Enzymes Recent Labs  Lab 05/07/18 1823 05/07/18 2201 05/08/18 0520 05/08/18 1055  TROPONINI 0.13* 0.13* 0.12* 0.11*   No results  for input(s): TROPIPOC in the last 168 hours.   BNP Recent Labs  Lab 05/07/18 1823  BNP 654.7*     DDimer No results for input(s): DDIMER in the last 168 hours.   Radiology    Dg Foot Complete Left  Result Date: 05/09/2018 Please see detailed radiograph report in office note.  Dg Foot Complete Right  Result Date: 05/09/2018 Please see detailed radiograph report in office note.   Cardiac Studies   2D Echo 05/09/18 Study Conclusions  - Left ventricle: The cavity size was normal. There was severe   hypertrophy of the septum with otherwise mild-moderate concentric   hypertrophy. Systolic function was normal. The estimated ejection   fraction was in the range of 50% to 55%. Hypokinesis of the   anteroseptal and inferoseptal myocardium. Doppler parameters are   consistent with abnormal left ventricular relaxation (grade 1   diastolic dysfunction). Doppler parameters are consistent with   indeterminate ventricular filling pressure. - Ventricular septum: The contour showed diastolic flattening   consistent with right  ventricular volume overload. - Aortic valve: There was very mild stenosis. There was no   regurgitation. - Mitral valve: Transvalvular velocity was within the normal range.   There was no evidence for stenosis. There was mild regurgitation. - Right ventricle: The cavity size was moderately dilated. Wall   thickness was normal. Systolic function was normal. - Right atrium: The atrium was moderately dilated. - Tricuspid valve: There was mild regurgitation. - Pulmonary arteries: Systolic pressure was mildly increased. PA   peak pressure: 45 mm Hg (S).  Impressions:  - Aortic valve is poorly visualized but the mean aortic valve   gradient is minimally elevated. Visually the valve does not   appear abnormal enough to cause elevated gradients. Consider   subvalvular or supravalvuluar stenosis. There is severe   hypertrophy of the basal septum which may be contributing.   Patient Profile     57 y.o.malewith morbid obesity,type 2 diabetes mellitus, hypertension, hyperlipidemia, medical noncompliance and obstructive sleep apnea who has chronic lower extremity edema. We were asked to evaluate for acute on chronic diastolic HF.    Assessment & Plan    1. Acute Diastolic CHF: in the setting of hypertensive urgency and diastolic dysfunction (grade 1 on echo). BP still elevated this morning at 135/101. Echo yesterday showed normal EF (50-55%) and doppler parameters to be consistent with indeterminate ventricular filling pressure. The contour of the ventricular septum showed diastolic flattening consistent with right ventricular volume overload. Systolic PA pressure mildly increased at 45 mmHg. He needs additional diuresis and better BP control.  -- Good Response thus far to IV Lasix, w/ an additional 3.4L out overnight  -- Net I/Os negative 6.1 L since admit  -- Weights not accurate. Reported change 420lb>>>320 lb. Will have RN recheck  -- SCr improving with diuresis, down from  1.4>>1.12  -- His body habitus makes it a bit difficult to gauge volume, but LEE is appreciated on exam today. Will continue IV diuretics until SCr starts to creep back up.   -- with normal renal function, lytes and elevated BP, can consider increasing dose of IV Lasix for more aggressive diuresis  -- When we suspect he is close to being euvolemic, may consider RHC for pressure measurements to see if additional diuresis is needed.   -- Once we transition to PO diuretic, we may need to consider torsemide instead of lasix for better GI absorption.  -- continue daily BMPs for monitoring of renal  function and K  -- Low salt diet  -- Daily weights  -- Strict I/Os  -- Continue BB for diastolic dysfunction   2. HTN: BP on admit was elevated in the 170s systolic and low 098J diastolic. History of medication noncompliance. BP taken at 0754 was 135/101. Both Coreg and Losartan have since been given. Will have RN recheck pressures. There is room in HR for further titration of Coreg if needed. Can also increase dose of Losartan.   3. Elevated Troponin: mildly elevated but flat trend, 0.13>>0.13>>0.12. This may be demand ischemia from hypertensive urgency and acute CHF. He denies CP. 2D echo shows normal LVEF at 50-55%, however there is hypokinesis of the anteroseptal and inferoseptal myocardium. He has multiple cardiac risk factors for CAD including HTN, DLD and DM. Will defer further ischemic w/u to MD.  4. LBBB: new since his last EKG from 2012. 2D echo shows normal LVEF at 50-55%, however there is hypokinesis of the anteroseptal and inferoseptal myocardium. He has multiple cardiac risk factors for CAD including HTN, DLD and DM. Will defer further ischemic w/u to MD.  5. DM: Hgb A1c 7.3. Management per primary.   6. LVH: noted on echo. There was severe hypertrophy of the LV septum with otherwise mild-moderate concentrichypertrophy. Systolic function was normal. The estimated ejection fraction was in the  range of 50% to 55%. Also ? subvalvular or supravalvuluar aortic  Stenosis (see echo report). Suspect etiology is poorly controlled HTN.    For questions or updates, please contact [facility] Please consult [email or web address] for contact info under        Signed, Robbie Lis, PA-C  05/10/2018, 10:23 AM

## 2018-05-10 NOTE — Progress Notes (Addendum)
[facility] PROGRESS NOTE  CARVEL RAYNARD JJK:[id number] DOB: [birth year] DOA: 05/07/2018  PCP: Dorothyann Peng, MD  Brief History/Interval Summary: 57 y.o. male with medical history significant of hypertension, hyperlipidemia, diabetes mellitus, obesity, OSA not on CPAP, left ear hearing loss, CKD-3, left bundle blockage, who presented with shortness of breath and bilateral leg edema.  Patient was thought to have congestive heart failure.  He was noted to have elevated troponin.  Cardiology was consulted.  Patient was hospitalized.  Patient seen by cardiology.  Underwent echocardiogram.  Started on IV diuretics.  Reason for Visit: Acute diastolic CHF  Consultants: Cardiology  Procedures:   Transthoracic echocardiogram Study Conclusions  - Left ventricle: The cavity size was normal. There was severe   hypertrophy of the septum with otherwise mild-moderate concentric   hypertrophy. Systolic function was normal. The estimated ejection   fraction was in the range of 50% to 55%. Hypokinesis of the   anteroseptal and inferoseptal myocardium. Doppler parameters are   consistent with abnormal left ventricular relaxation (grade 1   diastolic dysfunction). Doppler parameters are consistent with   indeterminate ventricular filling pressure. - Ventricular septum: The contour showed diastolic flattening   consistent with right ventricular volume overload. - Aortic valve: There was very mild stenosis. There was no   regurgitation. - Mitral valve: Transvalvular velocity was within the normal range.   There was no evidence for stenosis. There was mild regurgitation. - Right ventricle: The cavity size was moderately dilated. Wall   thickness was normal. Systolic function was normal. - Right atrium: The atrium was moderately dilated. - Tricuspid valve: There was mild regurgitation. - Pulmonary arteries: Systolic pressure was mildly increased. PA   peak pressure: 45 mm Hg  (S). Impressions: - Aortic valve is poorly visualized but the mean aortic valve   gradient is minimally elevated. Visually the valve does not   appear abnormal enough to cause elevated gradients. Consider   subvalvular or supravalvuluar stenosis. There is severe   hypertrophy of the basal septum which may be contributing.  Antibiotics: None  Subjective/Interval History: Patient states that he is feeling better compared to how he was few days ago.  Shortness of breath has improved.  Denies any chest pain.  No dizziness lightheadedness.  No palpitations.  Complains of discomfort in his first toe on the right foot  ROS: Denies any headaches  Objective:  Vital Signs  Vitals:   05/09/18 2107 05/10/18 0629 05/10/18 0757 05/10/18 1133  BP:  (!) 147/92 (!) 135/101 119/76  Pulse: 89 85 87 78  Resp: 20 17 (!) 21 19  Temp:  99.6 F (37.6 C) 99.6 F (37.6 C) 98.3 F (36.8 C)  TempSrc:  Oral Oral Oral  SpO2: 95% 96% 95% 90%  Weight:  (!) 147.5 kg    Height:        Intake/Output Summary (Last 24 hours) at 05/10/2018 1252 Last data filed at 05/10/2018 1100 Gross per 24 hour  Intake 2390.91 ml  Output 3275 ml  Net -884.09 ml   Filed Weights   05/08/18 2316 05/09/18 1500 05/10/18 0629  Weight: (!) 195 kg (!) 148.3 kg (!) 147.5 kg    General appearance: Awake alert.  In no distress Resp: Effort seems to be normal at rest.  No use of accessory muscles.  Improving air entry bilaterally.  Few crackles at the bases.  No wheezes or rhonchi. Cardio: S1-S2 is normal regular.  No S3-S4.  No rubs murmurs or bruit GI: Abdomen soft.  Nontender nondistended.  Bowel sounds are present normal.  No masses organomegaly Extremities: Right first toe noted to be mildly swollen.  Tender.  Limited range of motion. Neurologic: Alert and oriented x3.  No obvious focal neurological deficits.  Lab Results:  Data Reviewed: I have personally reviewed following labs and imaging studies  CBC: Recent Labs   Lab 05/07/18 1823 05/09/18 0013 05/10/18 0706  WBC 5.8 6.5 5.2  HGB 14.5 13.9 14.4  HCT 50.4 48.9 50.7  MCV 96.9 98.6 98.6  PLT 236 219 210    Basic Metabolic Panel: Recent Labs  Lab 05/07/18 1823 05/08/18 0520 05/09/18 0013 05/10/18 0706  NA 144 146* 145 144  K 4.1 4.0 4.6 4.2  CL 106 105 102 99  CO2 27 33* 31 34*  GLUCOSE 93 106* 126* 120*  BUN 20 19 21* 13  CREATININE 1.44* 1.45* 1.40* 1.12  CALCIUM 8.9 8.9 8.7* 8.9    GFR: Estimated Creatinine Clearance: 104.7 mL/min (by C-G formula based on SCr of 1.12 mg/dL).  Liver Function Tests: Recent Labs  Lab 05/08/18 0520  AST 23  ALT 24  ALKPHOS 102  BILITOT 1.2  PROT 5.9*  ALBUMIN 3.0*    Cardiac Enzymes: Recent Labs  Lab 05/07/18 1823 05/07/18 2201 05/08/18 0520 05/08/18 1055  TROPONINI 0.13* 0.13* 0.12* 0.11*    CBG: Recent Labs  Lab 05/08/18 0738 05/08/18 1142 05/08/18 1610 05/08/18 2013  GLUCAP 102* 75 84 140*    Lipid Profile: Recent Labs    05/08/18 0520  CHOL 103  HDL 37*  LDLCALC 58  TRIG 42  CHOLHDL 2.8    Thyroid Function Tests: Recent Labs    05/08/18 0520  TSH 2.202     Recent Results (from the past 240 hour(s))  MRSA PCR Screening     Status: None   Collection Time: 05/07/18 11:40 PM  Result Value Ref Range Status   MRSA by PCR NEGATIVE NEGATIVE Final    Comment:        The GeneXpert MRSA Assay (FDA approved for NASAL specimens only), is one component of a comprehensive MRSA colonization surveillance program. It is not intended to diagnose MRSA infection nor to guide or monitor treatment for MRSA infections. Performed at [facility], [address] [place], [place] [address]       Radiology Studies: Dg Foot Complete Left  Result Date: 05/09/2018 Please see detailed radiograph report in office note.  Dg Foot Complete Right  Result Date: 05/09/2018 Please see detailed radiograph report in office note.    Medications:  Scheduled: .  aspirin EC  81 mg Oral Daily  . atorvastatin  20 mg Oral Daily  . carvedilol  6.25 mg Oral BID WC  . furosemide  40 mg Intravenous Q12H  . gabapentin  100 mg Oral QHS  . losartan  25 mg Oral Daily  . sodium chloride flush  3 mL Intravenous Q12H   Continuous: . sodium chloride 10 mL/hr at 05/10/18 0645  . heparin 1,950 Units/hr (05/10/18 1027)  . nitroGLYCERIN Stopped (05/09/18 1046)   UEA:VWUJWJ chloride, acetaminophen, ALPRAZolam, hydrALAZINE, hydrOXYzine, morphine injection, sodium chloride flush, zolpidem  Assessment/Plan:    Acute diastolic CHF Echocardiogram shows normal systolic function but left ventricle hypertrophy is noted.  Diastolic dysfunction noted.  Remains on IV diuretics.  He is diuresing well.  Weight appears to be decreasing.  Patient placed on beta-blocker and ARB.  Blood pressure has improved.  TSH is normal.  Urine drug screen unremarkable.  Further management per cardiology.     Acute respiratory failure with hypoxia  Most likely secondary to CHF.  He has been weaned off of oxygen this morning.  Respiratory effort has improved.  Continue to watch.    Possible NSTEMI/LBBB Could also be demand ischemia.  He is on aspirin heparin and he is also nitroglycerin.  Also on beta-blocker.  Cardiology continues to follow.  He is on statin.  LDL is 58.  Patient denies any previous history of known CAD.  Cardiology to consider ischemic work-up.  Hypertensive urgency Blood pressures have improved.  Continue to titrate nitroglycerin.  Could be turned off.  Hyperlipidemia Continue statin  Obstructive sleep apnea/ chronic respiratory failure with hypercapnia Apparently patient with severe sleep apnea symptoms.  Unable to maintain saturations without the machine and having difficulty using the CPAP machine.  Records reviewed.  He appears to have a history of severe sleep apnea based on records from 2004.  He apparently has undergone uvulopalatopharyngoplasty previously.  He  also required tracheostomy previously.  We will proceed with ABG. ABG showed hypercapnia.  Discussed with Dr. Craige Cotta with Pulmonology.  He recommends pursuing trilogy ventilator and an outpatient appointment with pulmonology for further management.  ABG showed a pH of 7.39.  PCO2 of 61.9.  PO2 of 59.7.  Patient continues to exhibit signs of hypercapnia associated with chronic respiratory failure.  Most likely secondary to obesity hypoventilation syndrome.  Patient also has obstructive sleep apnea.  Patient requires the use of an IV both at bedtime and daytime to help with exacerbation.  The use of the NIV will treat patient's high PCO2 levels and can reduce risk of exacerbations and future hospitalizations when used at night and during the day.  Patient will need these advance sitting in conjunction with current medication regimen.  BiPAP is not an option due to its functional limitations and the severity of the patient's condition.  Failure to have NIV available for use over 24-hour period could lead to death.  Acute on chronic kidney disease stage III The last labs in our system are from 2013 when his creatinine was 0.98.  Creatinine 1.44 at admission.  Renal function has improved.  So this could have been all acute renal failure possibly due to cardiorenal syndrome.  Continue to watch for now.    Morbid obesity Body mass index is 59.96 kg/m.  Pain in the right first toe We will check uric acid.  X-ray of the right foot done at podiatry office couple weeks ago did not show any acute findings.  DVT Prophylaxis: Currently on IV heparin    Code Status: Full code Family Communication: Discussed with the patient Disposition Plan: Management as outlined above.  Continues to require IV diuretics.  Continues to require oxygen to maintain saturations.  Mobilize as tolerated.    LOS: 3 days   Osvaldo Shipper  [facility] Pager [phone] 05/10/2018, 12:52 PM  If 7PM-7AM, please contact  night-coverage at [email or web address], password [facility]

## 2018-05-10 NOTE — Progress Notes (Signed)
ANTICOAGULATION CONSULT NOTE - Follow Up Consult  Pharmacy Consult for heparin Indication: chest pain/ACS  Labs: Recent Labs    05/07/18 1823 05/07/18 2201 05/08/18 0520 05/08/18 1055  05/08/18 2311 05/09/18 0013 05/09/18 0738 05/10/18 0706  HGB 14.5  --   --   --   --   --  13.9  --  14.4  HCT 50.4  --   --   --   --   --  48.9  --  50.7  PLT 236  --   --   --   --   --  219  --  210  HEPARINUNFRC  --   --  0.18*  --    < > 0.19*  --  0.83* 0.71*  CREATININE 1.44*  --  1.45*  --   --   --  1.40*  --  1.12  TROPONINI 0.13* 0.13* 0.12* 0.11*  --   --   --   --   --    < > = values in this interval not displayed.    Assessment: 57yo male subtherapeutic on heparin  Heparin level now 0.71 CBC stable  Goal of Therapy:  Heparin level 0.3-0.7 units/ml   Plan:  Decrease heparin to 1950 units / hr Follow up plans for heparin Daily heparin level, CBC  Thank you Okey Regal, PharmD [phone]     05/10/2018,9:40 AM

## 2018-05-10 NOTE — Progress Notes (Signed)
RN called for RT to place pt on dream station in BIPAP mode. Pts settings are 20/12 with 2lpm bled in at this time. Pt resting comfortably. RT will continue to monitor.

## 2018-05-10 NOTE — Progress Notes (Signed)
Occupational Therapy Treatment Patient Details Name: Mark [name] MRN: [id number] DOB: 1960-10-25 Today's Date: 05/10/2018    History of present illness Pt is a 57 y.o. male admitted 05/07/18 with SOB and BLE edema; worked up for CHF. PMH includes HTN, HLD, DM, OSA, obeskty, CKD3, L ear hearing loss, L bundle blockage.    OT comments  Pt talked about drinking a 5 hour energy everyday to make it through his work shift. 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 Pt educated on energy conservation and provided a detailed handout. Pt agreeable to seated movement to help with exercise and increasing activity tolerance. Pt encouraged to walk daily.    Follow Up Recommendations  No OT follow up;Supervision - Intermittent    Equipment Recommendations  None recommended by OT    Recommendations for Other Services      Precautions / Restrictions Precautions Precautions: None       Mobility Bed Mobility               General bed mobility comments: in chair on arrival  Transfers Overall transfer level: Independent                    Balance                                           ADL either performed or assessed with clinical judgement   ADL Overall ADL's : Needs assistance/impaired Eating/Feeding: Independent;Sitting   Grooming: Sitting;Independent                                 General ADL Comments: provided Energy conservation handout and reviewed in depth. pt describes working 12-13 hour day starting at 430am and ending the day around 7pm at times. Pt encouraged to start slow with return to work and progressively incr as body allows. Pt picking 2 energy conservations to use daily and on of those is finding a way to sit between patients. pt plans to purchase reacher due to patient is unabel to don doff pants or reach feet.      Vision       Perception     Praxis      Cognition  Arousal/Alertness: Awake/alert Behavior During Therapy: WFL for tasks assessed/performed Overall Cognitive Status: Within Functional Limits for tasks assessed                                          Exercises     Shoulder Instructions       General Comments saturations stable on oxygen    Pertinent Vitals/ Pain       Pain Assessment: No/denies pain  Home Living                                          Prior Functioning/Environment              Frequency  Min 2X/week        Progress Toward Goals  OT Goals(current goals can now be found in the care plan section)  Progress towards  OT goals: Progressing toward goals  Acute Rehab OT Goals Patient Stated Goal: to get back to work OT Goal Formulation: With patient Time For Goal Achievement: 05/16/18 Potential to Achieve Goals: Good ADL Goals Additional ADL Goal #1: pt will be aware of energy conservation strategies from handout that may be of benefit to him at home and work  Plan Discharge plan remains appropriate    Co-evaluation                 AM-PAC PT "6 Clicks" Daily Activity     Outcome Measure   Help from another person eating meals?: None Help from another person taking care of personal grooming?: A Little Help from another person toileting, which includes using toliet, bedpan, or urinal?: A Little Help from another person bathing (including washing, rinsing, drying)?: A Little Help from another person to put on and taking off regular upper body clothing?: A Little Help from another person to put on and taking off regular lower body clothing?: A Little 6 Click Score: 19    End of Session Equipment Utilized During Treatment: Oxygen  OT Visit Diagnosis: Unsteadiness on feet (R26.81)   Activity Tolerance Patient tolerated treatment well   Patient Left in chair;with call bell/phone within reach   Nurse Communication Mobility status;Precautions         Time: 0981-1914 OT Time Calculation (min): 24 min  Charges: OT General Charges $OT Visit: 1 Visit OT Treatments $Self Care/Home Management : 8-22 mins   Mateo Flow, OTR/L  Acute Rehabilitation Services Pager: [phone] Office: [phone] .    Boone Master B 05/10/2018, 3:14 PM

## 2018-05-10 NOTE — Evaluation (Signed)
Physical Therapy Evaluation Patient Details Name: Mark [name] MRN: [id number] DOB: April 01, 1961 Today's Date: 05/10/2018   History of Present Illness  Pt is a 57 y.o. male admitted 05/07/18 with SOB and BLE edema; worked up for CHF. PMH includes HTN, HLD, DM, OSA, obesity, CKD3, L ear hearing loss, L bundle blockage.    Clinical Impression  Patient evaluated by Physical Therapy with no further acute PT needs identified. PTA, pt indep and lives with signficant other. Today, pt indep with mobility, although limited by decreased activity tolerance. SpO2 >90% on RA (RN notified). Educ on energy conservation and importance of exercise/walking program for improved cardiovascular endurance. All education has been completed and the patient has no further questions. Pt encouraged to continue amb during hospital admission. PT is signing off. Thank you for this referral.  Resting SpO2 93% on RA Ambulatory SpO2 100% on RA    Follow Up Recommendations (Outpatient cardiac rehab)    Equipment Recommendations  None recommended by PT    Recommendations for Other Services       Precautions / Restrictions Precautions Precautions: None Precaution Comments: monitor O2 Restrictions Weight Bearing Restrictions: No      Mobility  Bed Mobility               General bed mobility comments: Sittin in recliner upon arrival  Transfers Overall transfer level: Independent Equipment used: None Transfers: Sit to/from Stand              Ambulation/Gait Ambulation/Gait assistance: Independent Gait Distance (Feet): 120 Feet Assistive device: None Gait Pattern/deviations: Step-through pattern;Decreased stride length;Wide base of support   Gait velocity interpretation: 1.31 - 2.62 ft/sec, indicative of limited community Insurance account manager Rankin (Stroke Patients Only)       Balance Overall balance assessment: No apparent balance  deficits (not formally assessed)                                           Pertinent Vitals/Pain Pain Assessment: No/denies pain    Home Living Family/patient expects to be discharged to:: Private residence Living Arrangements: Spouse/significant other Available Help at Discharge: Family;Available PRN/intermittently Type of Home: House         Home Equipment: None      Prior Function Level of Independence: Independent         Comments: works as barber/beautician near [employer]   Dominant Hand: Right    Extremity/Trunk Assessment   Upper Extremity Assessment Upper Extremity Assessment: Overall WFL for tasks assessed    Lower Extremity Assessment Lower Extremity Assessment: Overall WFL for tasks assessed       Communication   Communication: HOH  Cognition Arousal/Alertness: Awake/alert Behavior During Therapy: WFL for tasks assessed/performed Overall Cognitive Status: Within Functional Limits for tasks assessed                                        General Comments      Exercises     Assessment/Plan    PT Assessment Patent does not need any further PT services  PT Problem List         PT Treatment Interventions  PT Goals (Current goals can be found in the Care Plan section)  Acute Rehab PT Goals PT Goal Formulation: All assessment and education complete, DC therapy    Frequency     Barriers to discharge        Co-evaluation               AM-PAC PT "6 Clicks" Daily Activity  Outcome Measure Difficulty turning over in bed (including adjusting bedclothes, sheets and blankets)?: None Difficulty moving from lying on back to sitting on the side of the bed? : None Difficulty sitting down on and standing up from a chair with arms (e.g., wheelchair, bedside commode, etc,.)?: None Help needed moving to and from a bed to chair (including a wheelchair)?: None Help needed walking in hospital  room?: None Help needed climbing 3-5 steps with a railing? : A Little 6 Click Score: 23    End of Session   Activity Tolerance: Patient tolerated treatment well Patient left: in chair;with call bell/phone within reach Nurse Communication: Mobility status PT Visit Diagnosis: Other abnormalities of gait and mobility (R26.89)    Time: 0355-9741 PT Time Calculation (min) (ACUTE ONLY): 17 min   Charges:   PT Evaluation $PT Eval Moderate Complexity: 1 Mod         Ina Homes, PT, DPT Acute Rehabilitation Services  Pager [phone] Office [phone]  Malachy Chamber 05/10/2018, 9:49 AM

## 2018-05-11 ENCOUNTER — Inpatient Hospital Stay (HOSPITAL_COMMUNITY): Payer: Managed Care, Other (non HMO)

## 2018-05-11 LAB — PULMONARY FUNCTION TEST
DL/VA % pred: 105 %
DL/VA: 4.93 ml/min/mmHg/L
DLCO UNC % PRED: 57 %
DLCO UNC: 19.38 ml/min/mmHg
DLCO cor % pred: 58 %
DLCO cor: 19.67 ml/min/mmHg
FEF 25-75 PRE: 1.88 L/s
FEF 25-75 Post: 1.51 L/sec
FEF2575-%Change-Post: -19 %
FEF2575-%PRED-POST: 49 %
FEF2575-%Pred-Pre: 62 %
FEV1-%Change-Post: -5 %
FEV1-%PRED-POST: 56 %
FEV1-%PRED-PRE: 59 %
FEV1-POST: 1.84 L
FEV1-PRE: 1.94 L
FEV1FVC-%Change-Post: 3 %
FEV1FVC-%Pred-Pre: 100 %
FEV6-%Change-Post: -8 %
FEV6-%PRED-POST: 55 %
FEV6-%PRED-PRE: 61 %
FEV6-POST: 2.25 L
FEV6-PRE: 2.46 L
FEV6FVC-%PRED-PRE: 104 %
FEV6FVC-%Pred-Post: 104 %
FVC-%CHANGE-POST: -8 %
FVC-%Pred-Post: 53 %
FVC-%Pred-Pre: 58 %
FVC-PRE: 2.46 L
FVC-Post: 2.25 L
POST FEV6/FVC RATIO: 100 %
PRE FEV6/FVC RATIO: 100 %
Post FEV1/FVC ratio: 81 %
Pre FEV1/FVC ratio: 79 %
RV % PRED: 99 %
RV: 2.27 L
TLC % PRED: 69 %
TLC: 5.03 L

## 2018-05-11 LAB — BASIC METABOLIC PANEL
Anion gap: 13 (ref 5–15)
BUN: 16 mg/dL (ref 6–20)
CHLORIDE: 99 mmol/L (ref 98–111)
CO2: 30 mmol/L (ref 22–32)
Calcium: 8.7 mg/dL — ABNORMAL LOW (ref 8.9–10.3)
Creatinine, Ser: 1.16 mg/dL (ref 0.61–1.24)
GFR calc non Af Amer: >60 mL/min (ref >60)
Glucose, Bld: 151 mg/dL — ABNORMAL HIGH (ref 70–99)
POTASSIUM: 4.3 mmol/L (ref 3.5–5.1)
SODIUM: 142 mmol/L (ref 135–145)

## 2018-05-11 LAB — CBC
HEMATOCRIT: 49.4 % (ref 39.0–52.0)
Hemoglobin: 14.1 g/dL (ref 13.0–17.0)
MCH: 28 pg (ref 26.0–34.0)
MCHC: 28.5 g/dL — ABNORMAL LOW (ref 30.0–36.0)
MCV: 98 fL (ref 78.0–100.0)
Platelets: 196 10*3/uL (ref 150–400)
RBC: 5.04 MIL/uL (ref 4.22–5.81)
RDW: 15.6 % — ABNORMAL HIGH (ref 11.5–15.5)
WBC: 4.7 10*3/uL (ref 4.0–10.5)

## 2018-05-11 LAB — HEPARIN LEVEL (UNFRACTIONATED): Heparin Unfractionated: 0.37 IU/mL (ref 0.30–0.70)

## 2018-05-11 LAB — URIC ACID: Uric Acid, Serum: 8.4 mg/dL (ref 3.7–8.6)

## 2018-05-11 MED ORDER — REGADENOSON 0.4 MG/5ML IV SOLN
0.4000 mg | Freq: Once | INTRAVENOUS | Status: AC
  Administered 2018-05-11: 0.4 mg via INTRAVENOUS

## 2018-05-11 MED ORDER — FUROSEMIDE 10 MG/ML IJ SOLN
40.0000 mg | Freq: Once | INTRAMUSCULAR | Status: AC
  Administered 2018-05-11: 40 mg via INTRAVENOUS
  Filled 2018-05-11: qty 4

## 2018-05-11 MED ORDER — FUROSEMIDE 10 MG/ML IJ SOLN
80.0000 mg | Freq: Two times a day (BID) | INTRAMUSCULAR | Status: AC
  Administered 2018-05-11 – 2018-05-15 (×9): 80 mg via INTRAVENOUS
  Filled 2018-05-11 (×9): qty 8

## 2018-05-11 MED ORDER — ALBUTEROL SULFATE (2.5 MG/3ML) 0.083% IN NEBU
2.5000 mg | INHALATION_SOLUTION | Freq: Once | RESPIRATORY_TRACT | Status: AC
  Administered 2018-05-11: 2.5 mg via RESPIRATORY_TRACT

## 2018-05-11 MED ORDER — REGADENOSON 0.4 MG/5ML IV SOLN
INTRAVENOUS | Status: AC
  Filled 2018-05-11: qty 5

## 2018-05-11 MED ORDER — TECHNETIUM TC 99M TETROFOSMIN IV KIT
30.0000 | PACK | Freq: Once | INTRAVENOUS | Status: AC | PRN
  Administered 2018-05-11: 30 via INTRAVENOUS

## 2018-05-11 NOTE — Progress Notes (Signed)
Progress Note  Patient Name: Mark [name] Date of Encounter: 05/11/2018  Primary Cardiologist: Chilton Si, MD   Subjective   Stable. Feels about the same as yesterday. Ok at rest but a bit SOB with activity. No chest pan.   Inpatient Medications    Scheduled Meds: . aspirin EC  81 mg Oral Daily  . atorvastatin  20 mg Oral Daily  . carvedilol  6.25 mg Oral BID WC  . furosemide  40 mg Intravenous Q12H  . gabapentin  100 mg Oral QHS  . losartan  25 mg Oral Daily  . sodium chloride flush  3 mL Intravenous Q12H   Continuous Infusions: . sodium chloride 10 mL/hr at 05/11/18 0214  . heparin 1,950 Units/hr (05/11/18 0525)  . nitroGLYCERIN Stopped (05/09/18 1046)   PRN Meds: sodium chloride, acetaminophen, ALPRAZolam, hydrALAZINE, hydrOXYzine, morphine injection, sodium chloride flush, zolpidem   Vital Signs    Vitals:   05/10/18 2354 05/10/18 2355 05/11/18 0024 05/11/18 0534  BP:   114/68 (!) 149/92  Pulse:   100 73  Resp:   20   Temp:   98.1 F (36.7 C) 99.1 F (37.3 C)  TempSrc:   Axillary Oral  SpO2: (!) 89% 93% 94% 97%  Weight:      Height:        Intake/Output Summary (Last 24 hours) at 05/11/2018 0640 Last data filed at 05/11/2018 0214 Gross per 24 hour  Intake 2288.71 ml  Output 2150 ml  Net 138.71 ml   Filed Weights   05/08/18 2316 05/09/18 1500 05/10/18 0629  Weight: (!) 195 kg (!) 148.3 kg (!) 147.5 kg    Telemetry    SR- Personally Reviewed  ECG    SR, new LBB - Personally Reviewed  Physical Exam   GEN: Morbidly obese AAM.  Neck: No JVD Cardiac: RRR, no murmurs, rubs, or gallops.  Respiratory: Clear to auscultation bilaterally. GI: Soft, nontender, non-distended  MS: tense bilateral LEE up above level of knee; No deformity. Neuro:  Nonfocal  Psych: Normal affect   Labs    Chemistry Recent Labs  Lab 05/08/18 0520 05/09/18 0013 05/10/18 0706 05/11/18 0404  NA 146* 145 144 142  K 4.0 4.6 4.2 4.3  CL 105 102 99 99    CO2 33* 31 34* 30  GLUCOSE 106* 126* 120* 151*  BUN 19 21* 13 16  CREATININE 1.45* 1.40* 1.12 1.16  CALCIUM 8.9 8.7* 8.9 8.7*  PROT 5.9*  --   --   --   ALBUMIN 3.0*  --   --   --   AST 23  --   --   --   ALT 24  --   --   --   ALKPHOS 102  --   --   --   BILITOT 1.2  --   --   --   GFRNONAA 51* 54* >60 >60  GFRAA 59* >60 >60 >60  ANIONGAP 8 12 11 13      Hematology Recent Labs  Lab 05/09/18 0013 05/10/18 0706 05/11/18 0404  WBC 6.5 5.2 4.7  RBC 4.96 5.14 5.04  HGB 13.9 14.4 14.1  HCT 48.9 50.7 49.4  MCV 98.6 98.6 98.0  MCH 28.0 28.0 28.0  MCHC 28.4* 28.4* 28.5*  RDW 15.9* 15.7* 15.6*  PLT 219 210 196    Cardiac Enzymes Recent Labs  Lab 05/07/18 1823 05/07/18 2201 05/08/18 0520 05/08/18 1055  TROPONINI 0.13* 0.13* 0.12* 0.11*   No results for input(s):  TROPIPOC in the last 168 hours.   BNP Recent Labs  Lab 05/07/18 1823  BNP 654.7*     DDimer No results for input(s): DDIMER in the last 168 hours.   Radiology    Dg Foot Complete Left  Result Date: 05/09/2018 Please see detailed radiograph report in office note.  Dg Foot Complete Right  Result Date: 05/09/2018 Please see detailed radiograph report in office note.   Cardiac Studies   2D Echo 05/09/18 Study Conclusions  - Left ventricle: The cavity size was normal. There was severe hypertrophy of the septum with otherwise mild-moderate concentric hypertrophy. Systolic function was normal. The estimated ejection fraction was in the range of 50% to 55%. Hypokinesis of the anteroseptal and inferoseptal myocardium. Doppler parameters are consistent with abnormal left ventricular relaxation (grade 1 diastolic dysfunction). Doppler parameters are consistent with indeterminate ventricular filling pressure. - Ventricular septum: The contour showed diastolic flattening consistent with right ventricular volume overload. - Aortic valve: There was very mild stenosis. There was  no regurgitation. - Mitral valve: Transvalvular velocity was within the normal range. There was no evidence for stenosis. There was mild regurgitation. - Right ventricle: The cavity size was moderately dilated. Wall thickness was normal. Systolic function was normal. - Right atrium: The atrium was moderately dilated. - Tricuspid valve: There was mild regurgitation. - Pulmonary arteries: Systolic pressure was mildly increased. PA peak pressure: 45 mm Hg (S).  Impressions:  - Aortic valve is poorly visualized but the mean aortic valve gradient is minimally elevated. Visually the valve does not appear abnormal enough to cause elevated gradients. Consider subvalvular or supravalvuluar stenosis. There is severe hypertrophy of the basal septum which may be contributing.  Patient Profile     57 y.o.malewith morbid obesity,type 2 diabetes mellitus, hypertension, hyperlipidemia, medical noncompliance and obstructive sleep apnea who has chronic lower extremity edema. We were asked to evaluate for acute on chronic diastolic HF  Assessment & Plan    1. Acute Diastolic CHF:in the setting of hypertensive urgency and diastolic dysfunction (grade 1 on echo).  Echo 05/09/18 showed normal EF (50-55%) and doppler parameters to be consistent with indeterminate ventricular filling pressure. The contour of the ventricular septum showed diastolic flattening consistent with right ventricular volume overload. Systolic PA pressure mildly increased at 45 mmHg. He needs additional diuresis and better BP control.             -- Good Response thus far to IV Lasix, w/ an additional 1.6L out overnight             -- Net I/Os negative 5.1 L since admit  -- Weight down from 327>>323 lb.              -- SCr stable at 1.16             -- His body habitus makes it a bit difficult to gauge volume, but LEE is appreciated on exam today. Will continue IV diuretics until SCr starts to creep back up.               -- with normal renal function, lytes and elevated BP, recommend increasing dose of IV Lasix for more aggressive diuresis. He is currently only on 40 BID. May consider increasing to 60 BID or even 80 BID. Will discuss with MD.              -- When we suspect he is close to being euvolemic, may consider RHC for pressure measurements to see if  additional diuresis is needed.              -- Once we transition to PO diuretic, we may need to consider torsemide instead of lasix for better GI absorption.             -- continue daily BMPs for monitoring of renal function and K             -- Low salt diet             -- Daily weights             -- Strict I/Os             -- Continue BB for diastolic dysfunction   2. HTN:BP on admit was elevated in the 170s systolic and low 846N diastolic. History of medication noncompliance. BP improved but still mildly elevated this morning at 149/92. Continue diuresis w/ IV Lasix + Coreg and Losartan. There is room in HR for further titration of Coreg if needed. Can also increase dose of Losartan.   3. Elevated Troponin: mildly elevated but flat trend, 0.13>>0.13>>0.12. This may be demand ischemia from hypertensive urgency and acute CHF. He denies CP. 2D echo shows normal LVEF at 50-55%, however there is hypokinesis of the anteroseptal and inferoseptal myocardium. He has multiple cardiac risk factors for CAD including HTN, DLD and DM. We will plan on Lexiscan Myoview to r/o ischemia. 2-day study due to body habitus.   4. LBBB: new since his last EKG from 2012. 2D echo shows normal LVEF at 50-55%, however there is hypokinesis of the anteroseptal and inferoseptal myocardium. He has multiple cardiac risk factors for CAD including HTN, DLD and DM. Plan for Lexiscan Myoview to r/o ischemia.   5. DM: Hgb A1c 7.3. Management per primary.   6. LVH: noted on echo. There was severe hypertrophy of the LV septum with otherwise mild-moderate concentrichypertrophy.  Systolic function was normal. The estimated ejectionfraction was in the range of 50% to 55%. Also ? subvalvular or supravalvuluar aortic  Stenosis (see echo report). Suspect etiology is poorly controlled HTN.   For questions or updates, please contact [facility] Please consult [email or web address] for contact info under        Signed, Robbie Lis, PA-C  05/11/2018, 6:40 AM

## 2018-05-11 NOTE — Progress Notes (Addendum)
PROGRESS NOTE    Mark [name]  ZOX:[id number] DOB: 01/29/1961 DOA: 05/07/2018 PCP: Dorothyann Peng, MD    Brief Narrative;57 y.o.malewith medical history significant ofhypertension, hyperlipidemia, diabetes mellitus, obesity, OSAnot on CPAP, left ear hearing loss, CKD-3, left bundle blockage, who presented with shortness of breath and bilateral leg edema.  Patient was thought to have congestive heart failure.  He was noted to have elevated troponin.  Cardiology was consulted.  Patient was hospitalized.  Patient seen by cardiology.  Underwent echocardiogram.  Started on IV diuretics.   Assessment & Plan:   Principal Problem:   Congestive heart failure (HCC) Active Problems:   LBBB (left bundle branch block)   Hyperlipidemia   Acute respiratory failure with hypoxia (HCC)   Elevated troponin   CKD (chronic kidney disease), stage III (HCC)   Hypertensive urgency  1-Acute Diastolic Heart failure;  ECHO with diastolic dysfunction.  Continue with IV lasix.  Weight slowly decreasing. 148----146 kg 1.9 L urine out put yesterday  Started on carvedilol, and Cozaar.   Acute hypoxic Respiratory failure;  Currently on 2 l oxygen.  Continue with IV lasix.   Possible NSTEMI; LBBB;  Elevation of troponin.  On aspirin, heparin Gtt.  Underwent Myoview today. 2 days study.   HTN urgency;  He was on nitroglycerin.   HLD;  On statins.   Obstructive Sleep Apnea; chronic respiratory failure with Hypercapnia.  -Apparently patient with severe sleep apnea symptoms.  Unable to maintain saturations without the machine and having difficulty using the CPAP machine.  -He apparently has undergone uvulopalatopharyngoplasty previously Dr Rito Ehrlich discussed with Dr. Craige Cotta with Pulmonology.  He recommends pursuing trilogy ventilator and an outpatient appointment with pulmonology for further management -PH 7.3, PCO2 61, PO 2 59.  -See Dr Rito Ehrlich note regarding needs for trilogy vent.  -will get  PFT/  CM helping arranging trilogy.   Pain right first toe; uric acid;  Uric acid 8.4.    Morbid obesity Body mass index is 59.96 kg/m. DVT prophylaxis: Heparin Gtt Code Status: full code.  Family Communication: Care discussed with wife who was at bedside.  Disposition Plan: Remain inpatient for treatment of hypoxic Resp failure. IV lasix. Stress test   Consultants:   Cardiology    Procedures:   Myoview;  ECHO; Impressions: - Aortic valve is poorly visualized but the mean aortic valve gradient is minimally elevated. Visually the valve does not appear abnormal enough to cause elevated gradients. Consider subvalvular or supravalvuluar stenosis. There is severe hypertrophy of the basal septum which may be contributing.     Antimicrobials:   none   Subjective: He is still SOB, not at baseline.  Feels some improvement.  Denies chest pain.   Objective: Vitals:   05/11/18 0024 05/11/18 0534 05/11/18 0741 05/11/18 0841  BP: 114/68 (!) 149/92  (!) 142/99  Pulse: 100 73  76  Resp: 20     Temp: 98.1 F (36.7 C) 99.1 F (37.3 C)  98.4 F (36.9 C)  TempSrc: Axillary Oral  Oral  SpO2: 94% 97%  100%  Weight:   (!) 146.9 kg   Height:        Intake/Output Summary (Last 24 hours) at 05/11/2018 0936 Last data filed at 05/11/2018 0843 Gross per 24 hour  Intake 2136.23 ml  Output 1925 ml  Net 211.23 ml   Filed Weights   05/09/18 1500 05/10/18 0629 05/11/18 0741  Weight: (!) 148.3 kg (!) 147.5 kg (!) 146.9 kg    Examination:  General exam:  Appears calm and comfortable  Respiratory system: Clear to auscultation. Respiratory effort normal. Cardiovascular system: S1 & S2 heard, RRR. No JVD, murmurs, rubs, gallops or clicks. Plus 2 edema Gastrointestinal system: Abdomen is nondistended, soft and nontender. No organomegaly or masses felt. Normal bowel sounds heard. Central nervous system: Alert and oriented. No focal neurological deficits. Extremities:  Symmetric 5 x 5 power. Skin: No rashes, lesions or ulcers Psychiatry: Judgement and insight appear normal. Mood & affect appropriate.     Data Reviewed: I have personally reviewed following labs and imaging studies  CBC: Recent Labs  Lab 05/07/18 1823 05/09/18 0013 05/10/18 0706 05/11/18 0404  WBC 5.8 6.5 5.2 4.7  HGB 14.5 13.9 14.4 14.1  HCT 50.4 48.9 50.7 49.4  MCV 96.9 98.6 98.6 98.0  PLT 236 219 210 196   Basic Metabolic Panel: Recent Labs  Lab 05/07/18 1823 05/08/18 0520 05/09/18 0013 05/10/18 0706 05/11/18 0404  NA 144 146* 145 144 142  K 4.1 4.0 4.6 4.2 4.3  CL 106 105 102 99 99  CO2 27 33* 31 34* 30  GLUCOSE 93 106* 126* 120* 151*  BUN 20 19 21* 13 16  CREATININE 1.44* 1.45* 1.40* 1.12 1.16  CALCIUM 8.9 8.9 8.7* 8.9 8.7*   GFR: Estimated Creatinine Clearance: 100.8 mL/min (by C-G formula based on SCr of 1.16 mg/dL). Liver Function Tests: Recent Labs  Lab 05/08/18 0520  AST 23  ALT 24  ALKPHOS 102  BILITOT 1.2  PROT 5.9*  ALBUMIN 3.0*   No results for input(s): LIPASE, AMYLASE in the last 168 hours. No results for input(s): AMMONIA in the last 168 hours. Coagulation Profile: No results for input(s): INR, PROTIME in the last 168 hours. Cardiac Enzymes: Recent Labs  Lab 05/07/18 1823 05/07/18 2201 05/08/18 0520 05/08/18 1055  TROPONINI 0.13* 0.13* 0.12* 0.11*   BNP (last 3 results) No results for input(s): PROBNP in the last 8760 hours. HbA1C: No results for input(s): HGBA1C in the last 72 hours. CBG: Recent Labs  Lab 05/08/18 0738 05/08/18 1142 05/08/18 1610 05/08/18 2013  GLUCAP 102* 75 84 140*   Lipid Profile: No results for input(s): CHOL, HDL, LDLCALC, TRIG, CHOLHDL, LDLDIRECT in the last 72 hours. Thyroid Function Tests: No results for input(s): TSH, T4TOTAL, FREET4, T3FREE, THYROIDAB in the last 72 hours. Anemia Panel: No results for input(s): VITAMINB12, FOLATE, FERRITIN, TIBC, IRON, RETICCTPCT in the last 72  hours. Sepsis Labs: No results for input(s): PROCALCITON, LATICACIDVEN in the last 168 hours.  Recent Results (from the past 240 hour(s))  MRSA PCR Screening     Status: None   Collection Time: 05/07/18 11:40 PM  Result Value Ref Range Status   MRSA by PCR NEGATIVE NEGATIVE Final    Comment:        The GeneXpert MRSA Assay (FDA approved for NASAL specimens only), is one component of a comprehensive MRSA colonization surveillance program. It is not intended to diagnose MRSA infection nor to guide or monitor treatment for MRSA infections. Performed at [name], [address], [place], [place] [address]          Radiology Studies: Dg Foot Complete Left  Result Date: 05/09/2018 Please see detailed radiograph report in office note.  Dg Foot Complete Right  Result Date: 05/09/2018 Please see detailed radiograph report in office note.       Scheduled Meds: . regadenoson      . aspirin EC  81 mg Oral Daily  . atorvastatin  20 mg  Oral Daily  . carvedilol  6.25 mg Oral BID WC  . furosemide  40 mg Intravenous Q12H  . gabapentin  100 mg Oral QHS  . losartan  25 mg Oral Daily  . regadenoson  0.4 mg Intravenous Once  . sodium chloride flush  3 mL Intravenous Q12H   Continuous Infusions: . sodium chloride 10 mL/hr at 05/11/18 0700  . heparin 1,950 Units/hr (05/11/18 0700)  . nitroGLYCERIN Stopped (05/09/18 1046)     LOS: 4 days    Time spent: 35 minutes.     Alba Cory, MD [facility] Pager [phone]  If 7PM-7AM, please contact night-coverage [email or web address] Password [facility] 05/11/2018, 9:36 AM

## 2018-05-11 NOTE — Progress Notes (Signed)
ANTICOAGULATION CONSULT NOTE - Follow Up Consult  Pharmacy Consult:  Heparin Indication: chest pain/ACS  Labs: Recent Labs    05/08/18 1055  05/09/18 0013 05/09/18 0738 05/10/18 0706 05/11/18 0404  HGB  --    < > 13.9  --  14.4 14.1  HCT  --   --  48.9  --  50.7 49.4  PLT  --   --  219  --  210 196  HEPARINUNFRC  --    < >  --  0.83* 0.71* 0.37  CREATININE  --   --  1.40*  --  1.12 1.16  TROPONINI 0.11*  --   --   --   --   --    < > = values in this interval not displayed.    Assessment: 24 YOM continues on IV heparin for ACS.  Heparin level is therapeutic; no bleeding reported.  Goal of Therapy:  Heparin level 0.3-0.7 units/ml   Plan:  Continue heparin gtt at 1950 units/hr Daily heparin level and CBC F/U with Myoview +/- cath   Derico Mitton D. Laney Potash, PharmD, BCPS, BCCCP 05/11/2018, 8:57 AM

## 2018-05-12 ENCOUNTER — Inpatient Hospital Stay (HOSPITAL_COMMUNITY): Payer: Managed Care, Other (non HMO)

## 2018-05-12 DIAGNOSIS — N183 Chronic kidney disease, stage 3 (moderate): Secondary | ICD-10-CM

## 2018-05-12 LAB — BASIC METABOLIC PANEL
Anion gap: 7 (ref 5–15)
BUN: 13 mg/dL (ref 6–20)
CHLORIDE: 97 mmol/L — AB (ref 98–111)
CO2: 38 mmol/L — ABNORMAL HIGH (ref 22–32)
Calcium: 9.1 mg/dL (ref 8.9–10.3)
Creatinine, Ser: 1.12 mg/dL (ref 0.61–1.24)
GFR calc Af Amer: >60 mL/min (ref >60)
GFR calc non Af Amer: >60 mL/min (ref >60)
Glucose, Bld: 130 mg/dL — ABNORMAL HIGH (ref 70–99)
Potassium: 4 mmol/L (ref 3.5–5.1)
SODIUM: 142 mmol/L (ref 135–145)

## 2018-05-12 LAB — CBC
HEMATOCRIT: 49.6 % (ref 39.0–52.0)
HEMOGLOBIN: 14.1 g/dL (ref 13.0–17.0)
MCH: 27.5 pg (ref 26.0–34.0)
MCHC: 28.4 g/dL — ABNORMAL LOW (ref 30.0–36.0)
MCV: 96.9 fL (ref 78.0–100.0)
Platelets: 189 10*3/uL (ref 150–400)
RBC: 5.12 MIL/uL (ref 4.22–5.81)
RDW: 15.1 % (ref 11.5–15.5)
WBC: 4 10*3/uL (ref 4.0–10.5)

## 2018-05-12 LAB — HEPARIN LEVEL (UNFRACTIONATED): Heparin Unfractionated: 0.35 IU/mL (ref 0.30–0.70)

## 2018-05-12 MED ORDER — TECHNETIUM TC 99M TETROFOSMIN IV KIT
30.0000 | PACK | Freq: Once | INTRAVENOUS | Status: AC | PRN
  Administered 2018-05-12: 30 via INTRAVENOUS

## 2018-05-12 MED ORDER — IPRATROPIUM-ALBUTEROL 0.5-2.5 (3) MG/3ML IN SOLN
3.0000 mL | Freq: Four times a day (QID) | RESPIRATORY_TRACT | Status: DC
  Administered 2018-05-12: 3 mL via RESPIRATORY_TRACT
  Filled 2018-05-12: qty 3

## 2018-05-12 MED ORDER — IPRATROPIUM-ALBUTEROL 0.5-2.5 (3) MG/3ML IN SOLN: 3.0000 mL | Freq: Four times a day (QID) | RESPIRATORY_TRACT | Status: DC | PRN

## 2018-05-12 MED ORDER — METOLAZONE 5 MG PO TABS
2.5000 mg | ORAL_TABLET | Freq: Every day | ORAL | Status: AC
  Administered 2018-05-13 – 2018-05-14 (×2): 2.5 mg via ORAL
  Filled 2018-05-12 (×2): qty 1

## 2018-05-12 NOTE — Progress Notes (Signed)
PROGRESS NOTE    Mark [name]  ZOX:[id number] DOB: 04/16/1961 DOA: 05/07/2018 PCP: Dorothyann PengSanders, Robyn, MD    Brief Narrative;57 y.o.malewith medical history significant ofhypertension, hyperlipidemia, diabetes mellitus, obesity, OSAnot on CPAP, left ear hearing loss, CKD-3, left bundle blockage, who presented with shortness of breath and bilateral leg edema.  Patient was thought to have congestive heart failure.  He was noted to have elevated troponin.  Cardiology was consulted.  Patient was hospitalized.  Patient seen by cardiology.  Underwent echocardiogram.  Started on IV diuretics.   Assessment & Plan:   Principal Problem:   Congestive heart failure (HCC) Active Problems:   LBBB (left bundle branch block)   Hyperlipidemia   Acute respiratory failure with hypoxia (HCC)   Elevated troponin   CKD (chronic kidney disease), stage III (HCC)   Hypertensive urgency  1-Acute Diastolic Heart failure;  ECHO with diastolic dysfunction.  Continue with IV lasix.  Weight slowly decreasing. 148----146 kg---144 4.4 L urine out put yesterday  Started on carvedilol, and Cozaar.  Continue with diuresis.   Acute hypoxic Respiratory failure;  Currently on 2 l oxygen.  Continue with IV lasix.  Pulmonary function test; moderate severe obstructive airway diseases and restrictive  lung diseases.  Start nebulizer.  Needs follow up with pulmonologist    Possible NSTEMI; LBBB;  Elevation of troponin.  On aspirin, heparin Gtt.  Underwent Myoview today. 2 days study.  No reversible ischemia on Myoview.   HTN urgency;  He was on nitroglycerin.   HLD;  On statins.   Obstructive Sleep Apnea; chronic respiratory failure with Hypercapnia.  -Apparently patient with severe sleep apnea symptoms.  Unable to maintain saturations without the machine and having difficulty using the CPAP machine.  -He apparently has undergone uvulopalatopharyngoplasty previously Dr Rito EhrlichKrishnan discussed with Dr. Craige CottaSood  with Pulmonology.  He recommends pursuing trilogy ventilator and an outpatient appointment with pulmonology for further management -PH 7.3, PCO2 61, PO 2 59.  -See Dr Rito EhrlichKrishnan note regarding needs for trilogy vent.  -will get PFT/  CM helping arranging trilogy.   Pain right first toe; uric acid;  Uric acid 8.4.    Morbid obesity Body mass index is 59.96 kg/m. DVT prophylaxis: Heparin Gtt Code Status: full code.  Family Communication: Care discussed with wife who was at bedside.  Disposition Plan: Remain inpatient for treatment of hypoxic Resp failure. IV lasix. Stress test   Consultants:   Cardiology    Procedures:   Myoview;  ECHO; Impressions: - Aortic valve is poorly visualized but the mean aortic valve gradient is minimally elevated. Visually the valve does not appear abnormal enough to cause elevated gradients. Consider subvalvular or supravalvuluar stenosis. There is severe hypertrophy of the basal septum which may be contributing.     Antimicrobials:   none   Subjective: Still with SOB. Denies chest pain.   Objective: Vitals:   05/12/18 0514 05/12/18 0800 05/12/18 1300 05/12/18 1334  BP: (!) 138/96 (!) 147/101 134/89   Pulse: 81 81 84   Resp: 19 18 18    Temp: 98.1 F (36.7 C) 98.8 F (37.1 C) 99 F (37.2 C)   TempSrc: Oral Oral Oral   SpO2: 94%   95%  Weight: (!) 144.1 kg     Height:        Intake/Output Summary (Last 24 hours) at 05/12/2018 1452 Last data filed at 05/12/2018 1300 Gross per 24 hour  Intake 1838.52 ml  Output 5240 ml  Net -3401.48 ml   [employer]iled Weights  05/10/18 0629 05/11/18 0741 05/12/18 0514  Weight: (!) 147.5 kg (!) 146.9 kg (!) 144.1 kg    Examination:  General exam: NAD Respiratory system: Crackles bases.  Cardiovascular system: S 1, S 2 RRR Gastrointestinal system: BS present, soft, nt Central nervous system: non focal.  Extremities: symmetric power.  Skin: no rashes.   Data Reviewed: I have  personally reviewed following labs and imaging studies  CBC: Recent Labs  Lab 05/07/18 1823 05/09/18 0013 05/10/18 0706 05/11/18 0404 05/12/18 0525  WBC 5.8 6.5 5.2 4.7 4.0  HGB 14.5 13.9 14.4 14.1 14.1  HCT 50.4 48.9 50.7 49.4 49.6  MCV 96.9 98.6 98.6 98.0 96.9  PLT 236 219 210 196 189   Basic Metabolic Panel: Recent Labs  Lab 05/08/18 0520 05/09/18 0013 05/10/18 0706 05/11/18 0404 05/12/18 0525  NA 146* 145 144 142 142  K 4.0 4.6 4.2 4.3 4.0  CL 105 102 99 99 97*  CO2 33* 31 34* 30 38*  GLUCOSE 106* 126* 120* 151* 130*  BUN 19 21* 13 16 13   CREATININE 1.45* 1.40* 1.12 1.16 1.12  CALCIUM 8.9 8.7* 8.9 8.7* 9.1   GFR: Estimated Creatinine Clearance: 103.3 mL/min (by C-G formula based on SCr of 1.12 mg/dL). Liver Function Tests: Recent Labs  Lab 05/08/18 0520  AST 23  ALT 24  ALKPHOS 102  BILITOT 1.2  PROT 5.9*  ALBUMIN 3.0*   No results for input(s): LIPASE, AMYLASE in the last 168 hours. No results for input(s): AMMONIA in the last 168 hours. Coagulation Profile: No results for input(s): INR, PROTIME in the last 168 hours. Cardiac Enzymes: Recent Labs  Lab 05/07/18 1823 05/07/18 2201 05/08/18 0520 05/08/18 1055  TROPONINI 0.13* 0.13* 0.12* 0.11*   BNP (last 3 results) No results for input(s): PROBNP in the last 8760 hours. HbA1C: No results for input(s): HGBA1C in the last 72 hours. CBG: Recent Labs  Lab 05/08/18 0738 05/08/18 1142 05/08/18 1610 05/08/18 2013  GLUCAP 102* 75 84 140*   Lipid Profile: No results for input(s): CHOL, HDL, LDLCALC, TRIG, CHOLHDL, LDLDIRECT in the last 72 hours. Thyroid Function Tests: No results for input(s): TSH, T4TOTAL, FREET4, T3FREE, THYROIDAB in the last 72 hours. Anemia Panel: No results for input(s): VITAMINB12, FOLATE, FERRITIN, TIBC, IRON, RETICCTPCT in the last 72 hours. Sepsis Labs: No results for input(s): PROCALCITON, LATICACIDVEN in the last 168 hours.  Recent Results (from the past 240  hour(s))  MRSA PCR Screening     Status: None   Collection Time: 05/07/18 11:40 PM  Result Value Ref Range Status   MRSA by PCR NEGATIVE NEGATIVE Final    Comment:        The GeneXpert MRSA Assay (FDA approved for NASAL specimens only), is one component of a comprehensive MRSA colonization surveillance program. It is not intended to diagnose MRSA infection nor to guide or monitor treatment for MRSA infections. Performed at [facility], [address], [place], [place] [address]          Radiology Studies: Nm Myocar Multi W/spect W/wall Motion / Ef  Result Date: 05/12/2018 CLINICAL DATA:  Acute coronary syndrome EXAM: MYOCARDIAL IMAGING WITH SPECT (REST AND PHARMACOLOGIC-STRESS - 2 DAY PROTOCOL) GATED LEFT VENTRICULAR WALL MOTION STUDY LEFT VENTRICULAR EJECTION FRACTION TECHNIQUE: Standard myocardial SPECT imaging was performed after resting intravenous injection of 30 mCi Tc-23m tetrofosmin. Subsequently, on a second day, intravenous infusion of Lexiscan was performed under the supervision of the Cardiology staff. At peak effect of the drug,  30 mCi Tc-84m tetrofosmin was injected intravenously and standard myocardial SPECT imaging was performed. Quantitative gated imaging was also performed to evaluate left ventricular wall motion, and estimate left ventricular ejection fraction. COMPARISON:  None. FINDINGS: Perfusion: Dilated heart. No reversible or irreversible defects to suggest ischemia or infarction. Wall Motion: Generalized hypokinesia.  Left ventricular dilatation. Left Ventricular Ejection Fraction: 37 % End diastolic volume 213 ml End systolic volume 133 ml IMPRESSION: 1. No reversible ischemia or infarction. 2. Generalized hypokinesia.  Dilated left ventricle. 3. Left ventricular ejection fraction 37% 4. Non invasive risk stratification*: Intermediate *2012 Appropriate Use Criteria for Coronary Revascularization Focused Update: J Am Coll Cardiol. 2012;59(9):857-881.  http://content.dementiazones.com.aspx?articleid=[email or web address] Electronically Signed   By: Charlett Nose M.D.   On: 05/12/2018 13:11        Scheduled Meds: . aspirin EC  81 mg Oral Daily  . atorvastatin  20 mg Oral Daily  . carvedilol  6.25 mg Oral BID WC  . furosemide  80 mg Intravenous Q12H  . gabapentin  100 mg Oral QHS  . losartan  25 mg Oral Daily  . sodium chloride flush  3 mL Intravenous Q12H   Continuous Infusions: . sodium chloride Stopped (05/11/18 1300)  . heparin 1,950 Units/hr (05/12/18 1610)  . nitroGLYCERIN Stopped (05/09/18 1046)     LOS: 5 days    Time spent: 35 minutes.     Alba Cory, MD [facility] Pager [phone]  If 7PM-7AM, please contact night-coverage [email or web address] Password [email or web address] 05/12/2018, 2:52 PM

## 2018-05-12 NOTE — Progress Notes (Signed)
Patient refused CPAP at this time.

## 2018-05-12 NOTE — Care Management Note (Addendum)
Case Management Note  Patient Details  Name: Mark [name] MRN: [id number] Date of Birth: 06/06/1961  Subjective/Objective:    Pt presented for SOB-CHF. Initiated on IV Lasix. PTA from home with support of wife. Patient works as a Paediatric nurseBarber. Patient will need order for DME 02 and liter flow prior to transition home. Trilogy has been approved via [employer].                 Action/Plan: CM will continue to monitor to see if [facility] will [facility] services.   Expected Discharge Date:                  Expected Discharge Plan:  Home w Home Health Services  In-House Referral:  NA  Discharge planning Services  CM Consult  Post Acute Care Choice:  Durable Medical Equipment, Home Health Choice offered to:  Patient  DME Arranged:  Oxygen, Other see comment(Trilogy) DME Agency:  [facility]  HH Arranged:   [facility] Agency:   N/A  Status of Service: Completed If discussed at Long Length of Stay Meetings, dates discussed:    Additional Comments: 1005 05-17-18 Tomi BambergerBrenda Graves-Bigelow, RN,BSN [phone] CM spoke with patient in regards to [facility]- Declined services at this time. Patient has the Heart Failure Packet and we discussed contents. CM did receive referral for New PCP. CM spoke with wife to make her aware to call [employer] 1-800 number on the back of card and they will assist with new PCP in network. Wife stated she works for [employer] and will be able to set that up. Wife to provide transportation home. No further needs from CM at this time.     1126 05-16-18 Tomi BambergerBrenda Graves-Bigelow, RN,BSN [phone] [facility] that [employer] denied Trilogy. Plan will be for patient to return home with CPAP. MD aware to schedule sleep study outpatient. [facility] to deliver DME 02 to the room. CM will continue to monitor for additional needs.    1039 11-10-17 Tomi BambergerBrenda Graves-Bigelow, RN,BSN [phone]  Pt will need to qualify for home 02. Will need ambulatory sat and 02 orders in  Epic prior to transition home. [facility] will supply the 6202- Mark [name] with [facility] aware of referral- if patient transitions home over the weekend please make sure orders are placed. CM will continue to monitor.  Gala LewandowskyGraves-Bigelow, Mark Borcherding Kaye, RN 05/12/2018, 4:09 PM

## 2018-05-12 NOTE — Progress Notes (Signed)
ANTICOAGULATION CONSULT NOTE - Follow Up Consult  Pharmacy Consult:  Heparin Indication: chest pain/ACS  Labs: Recent Labs    05/10/18 0706 05/11/18 0404 05/12/18 0525  HGB 14.4 14.1 14.1  HCT 50.7 49.4 49.6  PLT 210 196 189  HEPARINUNFRC 0.71* 0.37 0.35  CREATININE 1.12 1.16 1.12    Assessment: 57 YOM continues on IV heparin for ACS.  Heparin level is therapeutic; no bleeding reported.  Goal of Therapy:  Heparin level 0.3-0.7 units/ml   Plan:  Continue heparin gtt at 1950 units/hr Daily heparin level and CBC F/U with Myoview +/- cath   Thank you Okey RegalLisa Lasha Echeverria, PharmD [phone] 05/12/2018, 10:06 AM

## 2018-05-12 NOTE — Progress Notes (Addendum)
Progress Note  Patient Name: Mark [name] Date of Encounter: 05/12/2018  Primary Cardiologist: Chilton Si, MD new for her on the 19th  Subjective   Sitting up in chair and feeling better but still with significant amount of edema.  Eating potato chips  Inpatient Medications    Scheduled Meds: . aspirin EC  81 mg Oral Daily  . atorvastatin  20 mg Oral Daily  . carvedilol  6.25 mg Oral BID WC  . furosemide  80 mg Intravenous Q12H  . gabapentin  100 mg Oral QHS  . ipratropium-albuterol  3 mL Nebulization Q6H  . losartan  25 mg Oral Daily  . sodium chloride flush  3 mL Intravenous Q12H   Continuous Infusions: . sodium chloride Stopped (05/11/18 1300)  . heparin 1,950 Units/hr (05/12/18 1191)  . nitroGLYCERIN Stopped (05/09/18 1046)   PRN Meds: sodium chloride, acetaminophen, ALPRAZolam, hydrALAZINE, hydrOXYzine, morphine injection, sodium chloride flush, zolpidem   Vital Signs    Vitals:   05/11/18 2301 05/11/18 2357 05/12/18 0514 05/12/18 0800  BP:  130/88 (!) 138/96 (!) 147/101  Pulse: 92 92 81 81  Resp:  18 19 18   Temp:  98.7 F (37.1 C) 98.1 F (36.7 C) 98.8 F (37.1 C)  TempSrc:  Oral Oral Oral  SpO2: 92% 95% 94%   Weight:   (!) 144.1 kg   Height:        Intake/Output Summary (Last 24 hours) at 05/12/2018 1258 Last data filed at 05/12/2018 1000 Gross per 24 hour  Intake 2078.52 ml  Output 5230 ml  Net -3151.48 ml   Filed Weights   05/10/18 0629 05/11/18 0741 05/12/18 0514  Weight: (!) 147.5 kg (!) 146.9 kg (!) 144.1 kg    Telemetry    SR - Personally Reviewed  ECG    No new - Personally Reviewed  Physical Exam   GEN: No acute distress.   Neck: No JVD but sitting up in chair.  Cardiac: RRR, no murmurs, rubs, or gallops.  Respiratory: Clear to diminished in basses with auscultation bilaterally. GI: Soft, nontender, non-distended  MS: 3-4+ edema lower ext and 2-3 + edema to hips; No deformity. Neuro:  Nonfocal  Psych: Normal  affect   Labs    Chemistry Recent Labs  Lab 05/08/18 0520  05/10/18 0706 05/11/18 0404 05/12/18 0525  NA 146*   < > 144 142 142  K 4.0   < > 4.2 4.3 4.0  CL 105   < > 99 99 97*  CO2 33*   < > 34* 30 38*  GLUCOSE 106*   < > 120* 151* 130*  BUN 19   < > 13 16 13   CREATININE 1.45*   < > 1.12 1.16 1.12  CALCIUM 8.9   < > 8.9 8.7* 9.1  PROT 5.9*  --   --   --   --   ALBUMIN 3.0*  --   --   --   --   AST 23  --   --   --   --   ALT 24  --   --   --   --   ALKPHOS 102  --   --   --   --   BILITOT 1.2  --   --   --   --   GFRNONAA 51*   < > >60 >60 >60  GFRAA 59*   < > >60 >60 >60  ANIONGAP 8   < > 11 13 7    < > =  values in this interval not displayed.     Hematology Recent Labs  Lab 05/10/18 0706 05/11/18 0404 05/12/18 0525  WBC 5.2 4.7 4.0  RBC 5.14 5.04 5.12  HGB 14.4 14.1 14.1  HCT 50.7 49.4 49.6  MCV 98.6 98.0 96.9  MCH 28.0 28.0 27.5  MCHC 28.4* 28.5* 28.4*  RDW 15.7* 15.6* 15.1  PLT 210 196 189    Cardiac Enzymes Recent Labs  Lab 05/07/18 1823 05/07/18 2201 05/08/18 0520 05/08/18 1055  TROPONINI 0.13* 0.13* 0.12* 0.11*   No results for input(s): TROPIPOC in the last 168 hours.   BNP Recent Labs  Lab 05/07/18 1823  BNP 654.7*     DDimer No results for input(s): DDIMER in the last 168 hours.   Radiology    No results found.  Cardiac Studies   2D Echo 05/09/18 Study Conclusions  - Left ventricle: The cavity size was normal. There was severe hypertrophy of the septum with otherwise mild-moderate concentric hypertrophy. Systolic function was normal. The estimated ejection fraction was in the range of 50% to 55%. Hypokinesis of the anteroseptal and inferoseptal myocardium. Doppler parameters are consistent with abnormal left ventricular relaxation (grade 1 diastolic dysfunction). Doppler parameters are consistent with indeterminate ventricular filling pressure. - Ventricular septum: The contour showed diastolic  flattening consistent with right ventricular volume overload. - Aortic valve: There was very mild stenosis. There was no regurgitation. - Mitral valve: Transvalvular velocity was within the normal range. There was no evidence for stenosis. There was mild regurgitation. - Right ventricle: The cavity size was moderately dilated. Wall thickness was normal. Systolic function was normal. - Right atrium: The atrium was moderately dilated. - Tricuspid valve: There was mild regurgitation. - Pulmonary arteries: Systolic pressure was mildly increased. PA peak pressure: 45 mm Hg (S).  Impressions:  - Aortic valve is poorly visualized but the mean aortic valve gradient is minimally elevated. Visually the valve does not appear abnormal enough to cause elevated gradients. Consider subvalvular or supravalvuluar stenosis. There is severe hypertrophy of the basal septum which may be contributing.  Patient Profile     57 y.o. male with morbid obesity,type 2 diabetes mellitus, hypertension, hyperlipidemia, medical noncompliance and obstructive sleep apnea who has chronic lower extremity edema-now admitted with acute diastolic HF.  Assessment & Plan    1. AcuteDiastolicCHF:in the setting of hypertensive urgencyand diastolic dysfunction (grade 1 on echo). Echo 05/09/18 showed normal EF (50-55%) and doppler parametersto beconsistent with indeterminate ventricular filling pressure. The contourof the ventricular septumshowed diastolic flattening consistent with right ventricular volume overload.Systolic PA pressure mildly increased at 45 mmHg. He needs additional diuresis and better BP control. -- Good Response thus far to IV Lasix, w/ an additional 1.L out overnight  May need metolazone or higher dose of lasix with such significant edema will defer to Dr. Rennis Golden   -- Net I/Os negative 8.7 L since admit but signficant amount of fluid still present with edema  2-3+ to hips.              -- Weight down from 327>>316 lb. today (14.1 Kg) -- SCr stable at 1.12 -- with normal renal function, lytes and elevated BP, IV Lasix increased  for more aggressive diuresis. Now on 80 BID.  -- When we suspect he is close to being euvolemic, may consider RHC for pressure measurements to see if additional diuresis is needed.  -- Once we transition to PO diuretic, we may need to consider torsemide instead of lasix for better GI absorption.             --  needs nutrition consult.  Was eating potato chips when I went in room, we discussed how salt is not his friend.                   2. HTN:BP on admit was elevated in the 170s systolic and low 161W120s diastolic. History of medication noncompliance. BP improved but still mildly elevated this morning at 149/92.Continue diuresis w/ IV Lasix + Coreg and Losartan.There is room in HR for further titration of Coreg if needed. Can also increase dose of Losartan.  3. Elevated Troponin: mildly elevated but flat trend, 0.13>>0.13>>0.12.  We will plan on Lexiscan Myoview to r/o ischemia. 2-day study due to body habitus.   4. LBBB: new since his last EKG from 2012. 2D echoshows normal LVEF at 50-55%, however there is hypokinesis of the anteroseptal and inferoseptal myocardium.He has multiple cardiac risk factors for CAD including HTN, DLD and DM. Plan for Lexiscan Myoview to r/o ischemia.   5. DM: Hgb A1c 7.3. Management per primary.   6. LVH: noted on echo.There was severe hypertrophy of theLVseptum with otherwise mild-moderate concentrichypertrophy. Systolic function was normal. The estimated ejectionfraction was in the range of 50% to 55%.Also ?subvalvular or supravalvuluaraortic Stenosis(see echo report). Suspect etiology is poorly controlled HTN.  PA systolic pressure of 45 mmHg     For questions or updates, please contact [facility] Please consult  [email or web address] for contact info under        Signed, Nada BoozerLaura Kjell Brannen, NP  05/12/2018, 12:58 PM

## 2018-05-13 LAB — BASIC METABOLIC PANEL
ANION GAP: 11 (ref 5–15)
BUN: 14 mg/dL (ref 6–20)
CALCIUM: 9.7 mg/dL (ref 8.9–10.3)
CHLORIDE: 96 mmol/L — AB (ref 98–111)
CO2: 37 mmol/L — AB (ref 22–32)
Creatinine, Ser: 1.1 mg/dL (ref 0.61–1.24)
GFR calc non Af Amer: >60 mL/min (ref >60)
GLUCOSE: 113 mg/dL — AB (ref 70–99)
POTASSIUM: 4.3 mmol/L (ref 3.5–5.1)
Sodium: 144 mmol/L (ref 135–145)

## 2018-05-13 LAB — CBC
HEMATOCRIT: 47.2 % (ref 39.0–52.0)
Hemoglobin: 13.6 g/dL (ref 13.0–17.0)
MCH: 27.8 pg (ref 26.0–34.0)
MCHC: 28.8 g/dL — ABNORMAL LOW (ref 30.0–36.0)
MCV: 96.3 fL (ref 78.0–100.0)
PLATELETS: 190 10*3/uL (ref 150–400)
RBC: 4.9 MIL/uL (ref 4.22–5.81)
RDW: 15.4 % (ref 11.5–15.5)
WBC: 4 10*3/uL (ref 4.0–10.5)

## 2018-05-13 LAB — HEPARIN LEVEL (UNFRACTIONATED): Heparin Unfractionated: 0.51 IU/mL (ref 0.30–0.70)

## 2018-05-13 MED ORDER — COLCHICINE 0.6 MG PO TABS
0.6000 mg | ORAL_TABLET | Freq: Every day | ORAL | Status: DC
  Administered 2018-05-13 – 2018-05-17 (×5): 0.6 mg via ORAL
  Filled 2018-05-13 (×5): qty 1

## 2018-05-13 MED ORDER — IPRATROPIUM-ALBUTEROL 0.5-2.5 (3) MG/3ML IN SOLN: 3.0000 mL | Freq: Two times a day (BID) | RESPIRATORY_TRACT | Status: DC

## 2018-05-13 MED ORDER — IPRATROPIUM-ALBUTEROL 0.5-2.5 (3) MG/3ML IN SOLN
3.0000 mL | Freq: Two times a day (BID) | RESPIRATORY_TRACT | Status: DC
  Administered 2018-05-14: 3 mL via RESPIRATORY_TRACT
  Filled 2018-05-13: qty 3

## 2018-05-13 NOTE — Progress Notes (Signed)
Pt states he has been using c-pap at night.  Hinton DyerYoko Duran Ohern, RN

## 2018-05-13 NOTE — Progress Notes (Signed)
PROGRESS NOTE    Mark [name]  ONG:[id number] DOB: 03/26/1961 DOA: 05/07/2018 PCP: Dorothyann Peng, MD    Brief Narrative;57 y.o.malewith medical history significant ofhypertension, hyperlipidemia, diabetes mellitus, obesity, OSAnot on CPAP, left ear hearing loss, CKD-3, left bundle blockage, who presented with shortness of breath and bilateral leg edema.  Patient was thought to have congestive heart failure.  He was noted to have elevated troponin.  Cardiology was consulted.  Patient was hospitalized.  Patient seen by cardiology.  Underwent echocardiogram.  Started on IV diuretics.   Assessment & Plan:   Principal Problem:   Congestive heart failure (HCC) Active Problems:   LBBB (left bundle branch block)   Hyperlipidemia   Acute respiratory failure with hypoxia (HCC)   Elevated troponin   CKD (chronic kidney disease), stage III (HCC)   Hypertensive urgency  1-Acute Diastolic Heart failure;  ECHO with diastolic dysfunction.  Continue with IV lasix.  Weight slowly decreasing. 148----146 kg---144-141 4.2 L urine out put yesterday  Started on carvedilol, and Cozaar.  Continue with diuresis.  Metolazone 2 doses ordered.   Acute hypoxic Respiratory failure;  Currently on 2 l oxygen.  Continue with IV lasix.  Pulmonary function test; moderate severe obstructive airway diseases and restrictive  lung diseases.  Continue with  nebulizer.  Needs follow up with pulmonologist    Possible NSTEMI; LBBB;  Elevation of troponin.  On aspirin, heparin Gtt.  Underwent Myoview today. 2 days study.  No reversible ischemia on Myoview.   HTN urgency;  He was on nitroglycerin.   HLD;  On statins.   Obstructive Sleep Apnea; chronic respiratory failure with Hypercapnia.  -Apparently patient with severe sleep apnea symptoms.  Unable to maintain saturations without the machine and having difficulty using the CPAP machine.  -He apparently has undergone uvulopalatopharyngoplasty  previously Dr Rito Ehrlich discussed with Dr. Craige Cotta with Pulmonology.  He recommends pursuing trilogy ventilator and an outpatient appointment with pulmonology for further management -PH 7.3, PCO2 61, PO 2 59.  -See Dr Rito Ehrlich note regarding needs for trilogy vent.  -will get PFT/  CM helping arranging trilogy.   Pain right first toe; uric acid;  Uric acid 8.4.  Start colchicine in case of pseudogout    Morbid obesity Body mass index is 59.96 kg/m. DVT prophylaxis: Heparin Gtt Code Status: full code.  Family Communication: Care discussed with wife who was at bedside.  Disposition Plan: Remain inpatient for treatment of hypoxic Resp failure. IV lasix. Stress test   Consultants:   Cardiology    Procedures:   Myoview;  ECHO; Impressions: - Aortic valve is poorly visualized but the mean aortic valve gradient is minimally elevated. Visually the valve does not appear abnormal enough to cause elevated gradients. Consider subvalvular or supravalvuluar stenosis. There is severe hypertrophy of the basal septum which may be contributing.     Antimicrobials:   none   Subjective: Breathing better. Still with significant LE edema.  Nebulizer also helping.   Objective: Vitals:   05/13/18 0019 05/13/18 0408 05/13/18 0834 05/13/18 1248  BP: (!) 136/94 (!) 143/87 (!) 142/86 115/83  Pulse: 82 82 69 71  Resp: 20 20  18   Temp: 98.3 F (36.8 C) 98.4 F (36.9 C) 98.7 F (37.1 C) 98.4 F (36.9 C)  TempSrc: Oral Oral Oral Oral  SpO2: 94% 95% 100% 100%  Weight:  (!) 141.5 kg    Height:        Intake/Output Summary (Last 24 hours) at 05/13/2018 1336 Last data filed at  05/13/2018 1248 Gross per 24 hour  Intake 1625.21 ml  Output 2650 ml  Net -1024.79 ml   Filed Weights   05/11/18 0741 05/12/18 0514 05/13/18 0408  Weight: (!) 146.9 kg (!) 144.1 kg (!) 141.5 kg    Examination:  General exam: NAD Respiratory system: Decreased breath sounds. .  Cardiovascular  system: S 1, S 2 RRR Gastrointestinal system: BS present, soft, nt Central nervous system: Non focal.  Extremities: symmetric power, plus 2 edema Skin: no rashes.   Data Reviewed: I have personally reviewed following labs and imaging studies  CBC: Recent Labs  Lab 05/09/18 0013 05/10/18 0706 05/11/18 0404 05/12/18 0525 05/13/18 0506  WBC 6.5 5.2 4.7 4.0 4.0  HGB 13.9 14.4 14.1 14.1 13.6  HCT 48.9 50.7 49.4 49.6 47.2  MCV 98.6 98.6 98.0 96.9 96.3  PLT 219 210 196 189 190   Basic Metabolic Panel: Recent Labs  Lab 05/09/18 0013 05/10/18 0706 05/11/18 0404 05/12/18 0525 05/13/18 0805  NA 145 144 142 142 144  K 4.6 4.2 4.3 4.0 4.3  CL 102 99 99 97* 96*  CO2 31 34* 30 38* 37*  GLUCOSE 126* 120* 151* 130* 113*  BUN 21* 13 16 13 14   CREATININE 1.40* 1.12 1.16 1.12 1.10  CALCIUM 8.7* 8.9 8.7* 9.1 9.7   GFR: Estimated Creatinine Clearance: 104.1 mL/min (by C-G formula based on SCr of 1.1 mg/dL). Liver Function Tests: Recent Labs  Lab 05/08/18 0520  AST 23  ALT 24  ALKPHOS 102  BILITOT 1.2  PROT 5.9*  ALBUMIN 3.0*   No results for input(s): LIPASE, AMYLASE in the last 168 hours. No results for input(s): AMMONIA in the last 168 hours. Coagulation Profile: No results for input(s): INR, PROTIME in the last 168 hours. Cardiac Enzymes: Recent Labs  Lab 05/07/18 1823 05/07/18 2201 05/08/18 0520 05/08/18 1055  TROPONINI 0.13* 0.13* 0.12* 0.11*   BNP (last 3 results) No results for input(s): PROBNP in the last 8760 hours. HbA1C: No results for input(s): HGBA1C in the last 72 hours. CBG: Recent Labs  Lab 05/08/18 0738 05/08/18 1142 05/08/18 1610 05/08/18 2013  GLUCAP 102* 75 84 140*   Lipid Profile: No results for input(s): CHOL, HDL, LDLCALC, TRIG, CHOLHDL, LDLDIRECT in the last 72 hours. Thyroid Function Tests: No results for input(s): TSH, T4TOTAL, FREET4, T3FREE, THYROIDAB in the last 72 hours. Anemia Panel: No results for input(s): VITAMINB12,  FOLATE, FERRITIN, TIBC, IRON, RETICCTPCT in the last 72 hours. Sepsis Labs: No results for input(s): PROCALCITON, LATICACIDVEN in the last 168 hours.  Recent Results (from the past 240 hour(s))  MRSA PCR Screening     Status: None   Collection Time: 05/07/18 11:40 PM  Result Value Ref Range Status   MRSA by PCR NEGATIVE NEGATIVE Final    Comment:        The GeneXpert MRSA Assay (FDA approved for NASAL specimens only), is one component of a comprehensive MRSA colonization surveillance program. It is not intended to diagnose MRSA infection nor to guide or monitor treatment for MRSA infections. Performed at [facility], [address], [place], [place] [address]          Radiology Studies: Nm Myocar Multi W/spect W/wall Motion / Ef  Result Date: 05/12/2018 CLINICAL DATA:  Acute coronary syndrome EXAM: MYOCARDIAL IMAGING WITH SPECT (REST AND PHARMACOLOGIC-STRESS - 2 DAY PROTOCOL) GATED LEFT VENTRICULAR WALL MOTION STUDY LEFT VENTRICULAR EJECTION FRACTION TECHNIQUE: Standard myocardial SPECT imaging was performed after resting intravenous injection of  30 mCi Tc-2970m tetrofosmin. Subsequently, on a second day, intravenous infusion of Lexiscan was performed under the supervision of the Cardiology staff. At peak effect of the drug, 30 mCi Tc-7770m tetrofosmin was injected intravenously and standard myocardial SPECT imaging was performed. Quantitative gated imaging was also performed to evaluate left ventricular wall motion, and estimate left ventricular ejection fraction. COMPARISON:  None. FINDINGS: Perfusion: Dilated heart. No reversible or irreversible defects to suggest ischemia or infarction. Wall Motion: Generalized hypokinesia.  Left ventricular dilatation. Left Ventricular Ejection Fraction: 37 % End diastolic volume 213 ml End systolic volume 133 ml IMPRESSION: 1. No reversible ischemia or infarction. 2. Generalized hypokinesia.  Dilated left ventricle. 3. Left ventricular ejection  fraction 37% 4. Non invasive risk stratification*: Intermediate *2012 Appropriate Use Criteria for Coronary Revascularization Focused Update: J Am Coll Cardiol. 2012;59(9):857-881. http://content.dementiazones.comonlinejacc.org/article.aspx?articleid=1201161 Electronically Signed   By: Charlett NoseKevin  Dover M.D.   On: 05/12/2018 13:11        Scheduled Meds: . aspirin EC  81 mg Oral Daily  . atorvastatin  20 mg Oral Daily  . carvedilol  6.25 mg Oral BID WC  . furosemide  80 mg Intravenous Q12H  . gabapentin  100 mg Oral QHS  . losartan  25 mg Oral Daily  . metolazone  2.5 mg Oral Daily  . sodium chloride flush  3 mL Intravenous Q12H   Continuous Infusions: . sodium chloride Stopped (05/11/18 1300)  . heparin 1,950 Units/hr (05/13/18 1100)  . nitroGLYCERIN Stopped (05/09/18 1046)     LOS: 6 days    Time spent: 35 minutes.     Alba CoryBelkys A Houa Ackert, MD [facility] Pager [phone]  If 7PM-7AM, please contact night-coverage [email or web address] Password [email or web address] 05/13/2018, 1:36 PM

## 2018-05-13 NOTE — Progress Notes (Signed)
ANTICOAGULATION CONSULT NOTE - Follow Up Consult  Pharmacy Consult:  Heparin Indication: chest pain/ACS  Labs: Recent Labs    05/11/18 0404 05/12/18 0525 05/13/18 0506  HGB 14.1 14.1 13.6  HCT 49.4 49.6 47.2  PLT 196 189 190  HEPARINUNFRC 0.37 0.35 0.51  CREATININE 1.16 1.12  --     Assessment: 57 YOM continues on IV heparin for ACS.  Underwent Lexiscan yesterday showing no reversible ischemia/infarction, with intermediate non-invasive risk stratification.   Heparin level this morning came back therapeutic at 0.51, on 1950 units/hr. Hgb 13.6, plt 190. No s/sx of bleeding. No infusion issues.   Goal of Therapy:  Heparin level 0.3-0.7 units/ml   Plan:  Continue heparin gtt at 1950 units/hr Daily heparin level and CBC F/u plan for duration of heparin infusion  Thank you Girard CooterKimberly Perkins, PharmD Clinical Pharmacist  Pager: [phone] Phone: [phone] 05/13/2018, 8:24 AM

## 2018-05-13 NOTE — Progress Notes (Signed)
Progress Note  Patient Name: [name] Date of Encounter: 05/13/2018  Primary Cardiologist: Chilton Si, MD   Subjective   No pain or SOB --still with edema  Inpatient Medications    Scheduled Meds: . aspirin EC  81 mg Oral Daily  . atorvastatin  20 mg Oral Daily  . carvedilol  6.25 mg Oral BID WC  . furosemide  80 mg Intravenous Q12H  . gabapentin  100 mg Oral QHS  . losartan  25 mg Oral Daily  . metolazone  2.5 mg Oral Daily  . sodium chloride flush  3 mL Intravenous Q12H   Continuous Infusions: . sodium chloride Stopped (05/11/18 1300)  . heparin 1,950 Units/hr (05/13/18 0904)  . nitroGLYCERIN Stopped (05/09/18 1046)   PRN Meds: sodium chloride, acetaminophen, ALPRAZolam, hydrALAZINE, hydrOXYzine, ipratropium-albuterol, morphine injection, sodium chloride flush, zolpidem   Vital Signs    Vitals:   05/12/18 2013 05/13/18 0019 05/13/18 0408 05/13/18 0834  BP: 136/83 (!) 136/94 (!) 143/87 (!) 142/86  Pulse: 84 82 82 69  Resp: 20 20 20    Temp: 98.6 F (37 C) 98.3 F (36.8 C) 98.4 F (36.9 C) 98.7 F (37.1 C)  TempSrc: Oral Oral Oral Oral  SpO2: 95% 94% 95% 100%  Weight:   (!) 141.5 kg   Height:        Intake/Output Summary (Last 24 hours) at 05/13/2018 1041 Last data filed at 05/13/2018 0908 Gross per 24 hour  Intake 1357.51 ml  Output 2810 ml  Net -1452.49 ml   Filed Weights   05/11/18 0741 05/12/18 0514 05/13/18 0408  Weight: (!) 146.9 kg (!) 144.1 kg (!) 141.5 kg    Telemetry    SR - Personally Reviewed  ECG    No new - Personally Reviewed  Physical Exam   GEN: No acute distress.   Neck: No JVD sitting up in chair  Cardiac: RRR, no murmurs, rubs, or gallops.  Respiratory: Clear to auscultation bilaterally. GI: obese,Soft, nontender, non-distended  MS: 3-4+ edema to above hips; No deformity. Neuro:  Nonfocal  Psych: Normal affect   Labs    Chemistry Recent Labs  Lab 05/08/18 0520  05/11/18 0404 05/12/18 0525  05/13/18 0805  NA 146*   < > 142 142 144  K 4.0   < > 4.3 4.0 4.3  CL 105   < > 99 97* 96*  CO2 33*   < > 30 38* 37*  GLUCOSE 106*   < > 151* 130* 113*  BUN 19   < > 16 13 14   CREATININE 1.45*   < > 1.16 1.12 1.10  CALCIUM 8.9   < > 8.7* 9.1 9.7  PROT 5.9*  --   --   --   --   ALBUMIN 3.0*  --   --   --   --   AST 23  --   --   --   --   ALT 24  --   --   --   --   ALKPHOS 102  --   --   --   --   BILITOT 1.2  --   --   --   --   GFRNONAA 51*   < > >60 >60 >60  GFRAA 59*   < > >60 >60 >60  ANIONGAP 8   < > 13 7 11    < > = values in this interval not displayed.     Hematology Recent Labs  Lab 05/11/18  0404 05/12/18 0525 05/13/18 0506  WBC 4.7 4.0 4.0  RBC 5.04 5.12 4.90  HGB 14.1 14.1 13.6  HCT 49.4 49.6 47.2  MCV 98.0 96.9 96.3  MCH 28.0 27.5 27.8  MCHC 28.5* 28.4* 28.8*  RDW 15.6* 15.1 15.4  PLT 196 189 190    Cardiac Enzymes Recent Labs  Lab 05/07/18 1823 05/07/18 2201 05/08/18 0520 05/08/18 1055  TROPONINI 0.13* 0.13* 0.12* 0.11*   No results for input(s): TROPIPOC in the last 168 hours.   BNP Recent Labs  Lab 05/07/18 1823  BNP 654.7*     DDimer No results for input(s): DDIMER in the last 168 hours.   Radiology    Nm Myocar Multi W/spect W/wall Motion / Ef  Result Date: 05/12/2018 CLINICAL DATA:  Acute coronary syndrome EXAM: MYOCARDIAL IMAGING WITH SPECT (REST AND PHARMACOLOGIC-STRESS - 2 DAY PROTOCOL) GATED LEFT VENTRICULAR WALL MOTION STUDY LEFT VENTRICULAR EJECTION FRACTION TECHNIQUE: Standard myocardial SPECT imaging was performed after resting intravenous injection of 30 mCi Tc-72m tetrofosmin. Subsequently, on a second day, intravenous infusion of Lexiscan was performed under the supervision of the Cardiology staff. At peak effect of the drug, 30 mCi Tc-34m tetrofosmin was injected intravenously and standard myocardial SPECT imaging was performed. Quantitative gated imaging was also performed to evaluate left ventricular wall motion, and  estimate left ventricular ejection fraction. COMPARISON:  None. FINDINGS: Perfusion: Dilated heart. No reversible or irreversible defects to suggest ischemia or infarction. Wall Motion: Generalized hypokinesia.  Left ventricular dilatation. Left Ventricular Ejection Fraction: 37 % End diastolic volume 213 ml End systolic volume 133 ml IMPRESSION: 1. No reversible ischemia or infarction. 2. Generalized hypokinesia.  Dilated left ventricle. 3. Left ventricular ejection fraction 37% 4. Non invasive risk stratification*: Intermediate *2012 Appropriate Use Criteria for Coronary Revascularization Focused Update: J Am Coll Cardiol. 2012;59(9):857-881. http://content.dementiazones.com.aspx?articleid=1201161 Electronically Signed   By: Charlett Nose M.D.   On: 05/12/2018 13:11    Cardiac Studies   2D Echo 05/09/18 Study Conclusions  - Left ventricle: The cavity size was normal. There was severe hypertrophy of the septum with otherwise mild-moderate concentric hypertrophy. Systolic function was normal. The estimated ejection fraction was in the range of 50% to 55%. Hypokinesis of the anteroseptal and inferoseptal myocardium. Doppler parameters are consistent with abnormal left ventricular relaxation (grade 1 diastolic dysfunction). Doppler parameters are consistent with indeterminate ventricular filling pressure. - Ventricular septum: The contour showed diastolic flattening consistent with right ventricular volume overload. - Aortic valve: There was very mild stenosis. There was no regurgitation. - Mitral valve: Transvalvular velocity was within the normal range. There was no evidence for stenosis. There was mild regurgitation. - Right ventricle: The cavity size was moderately dilated. Wall thickness was normal. Systolic function was normal. - Right atrium: The atrium was moderately dilated. - Tricuspid valve: There was mild regurgitation. - Pulmonary arteries: Systolic  pressure was mildly increased. PA peak pressure: 45 mm Hg (S).  Impressions:  - Aortic valve is poorly visualized but the mean aortic valve gradient is minimally elevated. Visually the valve does not appear abnormal enough to cause elevated gradients. Consider subvalvular or supravalvuluar stenosis. There is severe hypertrophy of the basal septum which may be contributing.  Patient Profile     57 y.o. male with morbid obesity,type 2 diabetes mellitus, hypertension, hyperlipidemia, medical noncompliance and obstructive sleep apnea who has chronic lower extremity edema-now admitted with acute diastolic HF  Assessment & Plan    Acute diastolic HF -- no ischemia on Nuc, EF with nuc 37%  but with echo 50-55% with G1DD. -- neg 10,194 and wt from 147 kg to 141.5 kg (neg 2528 yesterday)  --metolazone added today 2.5 mg BID for 2 doses.  -- may need torsemide for diuresis as outpt   HTN  BP 143/87 to 142/86  Elevated troponin mildly elevated but flat trend, 0.13>>0.13>>0.12.  Neg nuc study for ischemia  New LBBB - no ischemia on nuc study  DM per IM   LVH: noted on echo.There was severe hypertrophy of theLVseptum with otherwise mild-moderate concentrichypertrophy. Systolic function was normal. The estimated ejectionfraction was in the range of 50% to 55%.Also ?subvalvular or supravalvuluaraortic Stenosis(see echo report). Suspect etiology is poorly controlled HTN.  PA systolic pressure of 45 mmHg          For questions or updates, please contact [facility] Please consult [email or web address] for contact info under        Signed, Nada BoozerLaura Dallen Bunte, NP  05/13/2018, 10:41 AM

## 2018-05-13 NOTE — Progress Notes (Signed)
Nutrition Education Note  RD consulted for nutrition education regarding CHF.  RD provided "Heart Failure  Nutrition Therapy" and "Heart Healthy Cooking Tips" handouts from the Academy of Nutrition and Dietetics. Reviewed patient's dietary recall. Provided examples on ways to decrease sodium intake in diet. Discouraged intake of processed foods and use of salt shaker. Encouraged fresh fruits and vegetables as well as whole grain sources of carbohydrates to maximize fiber intake.   RD discussed why it is important for patient to adhere to diet recommendations, and emphasized the role of fluids, foods to avoid, and importance of weighing self daily. Teach back method used.  Expect fair compliance.  Body mass index is 43.52 kg/m. Pt meets criteria for morbid obesity based on current BMI.  Current diet order is heart healthy, patient is consuming approximately 100% of meals at this time. Labs and medications reviewed. No further nutrition interventions warranted at this time. RD contact information provided. If additional nutrition issues arise, please re-consult RD.   Joaquin CourtsKimberly Delsie Amador, RD, LDN, CNSC Pager [phone] After Hours Pager [phone]

## 2018-05-13 NOTE — Progress Notes (Signed)
Patient refused breathing treatment at this time but wanted to be placed on CPAP for the night. No distress noted RCP will continue to follow.

## 2018-05-14 ENCOUNTER — Encounter (HOSPITAL_COMMUNITY): Payer: Medicare Other

## 2018-05-14 DIAGNOSIS — I5031 Acute diastolic (congestive) heart failure: Secondary | ICD-10-CM

## 2018-05-14 LAB — HEPARIN LEVEL (UNFRACTIONATED): HEPARIN UNFRACTIONATED: 0.52 [IU]/mL (ref 0.30–0.70)

## 2018-05-14 MED ORDER — IPRATROPIUM-ALBUTEROL 0.5-2.5 (3) MG/3ML IN SOLN: 3.0000 mL | Freq: Four times a day (QID) | RESPIRATORY_TRACT | Status: DC | PRN

## 2018-05-14 MED ORDER — HEPARIN SODIUM (PORCINE) 5000 UNIT/ML IJ SOLN
5000.0000 [IU] | Freq: Three times a day (TID) | INTRAMUSCULAR | Status: DC
  Administered 2018-05-14 – 2018-05-17 (×8): 5000 [IU] via SUBCUTANEOUS
  Filled 2018-05-14 (×8): qty 1

## 2018-05-14 NOTE — Plan of Care (Signed)
Ambulated in hallway on RA. Sats dropped to 87% after ambulating 50 ft. Recovered to 97% with rest. Back to room. Sats on RA 94% at Rest. Instructed to call prn SOB. Will monitor.

## 2018-05-14 NOTE — Progress Notes (Addendum)
ANTICOAGULATION CONSULT NOTE - Follow Up Consult  Pharmacy Consult:  Heparin Indication: chest pain/ACS  Labs: Recent Labs    05/12/18 0525 05/13/18 0506 05/13/18 0805 05/14/18 0435  HGB 14.1 13.6  --   --   HCT 49.6 47.2  --   --   PLT 189 190  --   --   HEPARINUNFRC 0.35 0.51  --  0.52  CREATININE 1.12  --  1.10  --     Assessment: 57 YOM continues on IV heparin for ACS.  Underwent Lexiscan yesterday showing no reversible ischemia/infarction, with intermediate non-invasive risk stratification.   Heparin level today came back therapeutic at 0.52, on 1950 units/hr. Hgb 13.6, plt 190 on last check. No s/sx of bleeding - hematoma in L arm stable. No infusion issues.   Goal of Therapy:  Heparin level 0.3-0.7 units/ml   Plan:  Continue heparin gtt at 1950 units/hr Daily heparin level and CBC F/u plan for duration of heparin infusion >>consider discontinuation if no further cardiology workup planned (over 48 hour of anticoagulation currently)  Thank you Girard CooterKimberly Perkins, PharmD Clinical Pharmacist  Pager: [phone] Phone: [phone] 05/14/2018, 10:57 AM  ADDENDUM Okay to discontinue heparin infusion per conversation with cardiology.  Girard CooterKimberly Perkins, PharmD Clinical Pharmacist

## 2018-05-14 NOTE — Progress Notes (Signed)
PROGRESS NOTE    Mark [name]  ZOX:[id number] DOB: 1961/03/07 DOA: 05/07/2018 PCP: Dorothyann Peng, MD    Brief Narrative;57 y.o.malewith medical history significant ofhypertension, hyperlipidemia, diabetes mellitus, obesity, OSAnot on CPAP, left ear hearing loss, CKD-3, left bundle blockage, who presented with shortness of breath and bilateral leg edema.  Patient was thought to have congestive heart failure.  He was noted to have elevated troponin.  Cardiology was consulted.  Patient was hospitalized.  Patient seen by cardiology.  Underwent echocardiogram.  Started on IV diuretics.   Assessment & Plan:   Principal Problem:   Congestive heart failure (HCC) Active Problems:   LBBB (left bundle branch block)   Hyperlipidemia   Acute respiratory failure with hypoxia (HCC)   Elevated troponin   CKD (chronic kidney disease), stage III (HCC)   Hypertensive urgency  1-Acute Diastolic Heart failure;  ECHO with diastolic dysfunction.  Continue with IV lasix.  Weight slowly decreasing. 148----146 kg---144-141---136 5 L urine out put yesterday  Started on carvedilol, and Cozaar.  Continue with diuresis.  Metolazone 2 doses ordered.  Continue with diuresis,   Acute hypoxic Respiratory failure;  Currently on 2 l oxygen.  Continue with IV lasix.  Pulmonary function test; moderate severe obstructive airway diseases and restrictive  lung diseases.  Continue with  nebulizer.  Needs follow up with pulmonologist  CPAP ordered   Possible NSTEMI; LBBB;  Elevation of troponin.  On aspirin, discontinue heparin  Underwent Myoview today. 2 days study.  No reversible ischemia on Myoview.   HTN urgency;  He was on nitroglycerin.   HLD;  On statins.   Obstructive Sleep Apnea; chronic respiratory failure with Hypercapnia.  -Apparently patient with severe sleep apnea symptoms.  Unable to maintain saturations without the machine and having difficulty using the CPAP machine.  -He  apparently has undergone uvulopalatopharyngoplasty previously Dr Rito Ehrlich discussed with Dr. Craige Cotta with Pulmonology.  He recommends pursuing trilogy ventilator and an outpatient appointment with pulmonology for further management -PH 7.3, PCO2 61, PO 2 59.  -See Dr Rito Ehrlich note regarding needs for trilogy vent.  -will get PFT/  Insurance decline triology.  Will ask CM to order CPAP. He is tried CPAP last night.   Pain right first toe; uric acid;  Uric acid 8.4.  Start colchicine in case of pseudogout  Pain better   Morbid obesity Body mass index is 59.96 kg/m.  Left arm edema; check doppler.    DVT prophylaxis: Heparin Gtt Code Status: full code.  Family Communication: Care discussed with wife who was at bedside.  Disposition Plan: Remain inpatient for treatment of hypoxic Resp failure. IV lasix. Stress test   Consultants:   Cardiology    Procedures:   Myoview;  ECHO; Impressions: - Aortic valve is poorly visualized but the mean aortic valve gradient is minimally elevated. Visually the valve does not appear abnormal enough to cause elevated gradients. Consider subvalvular or supravalvuluar stenosis. There is severe hypertrophy of the basal septum which may be contributing.     Antimicrobials:   none   Subjective: Breathing better. Still with significant LE edema.  Nebulizer also helping.   Objective: Vitals:   05/13/18 2212 05/14/18 0057 05/14/18 0514 05/14/18 0912  BP:  (!) 141/94 137/88   Pulse:  80 72   Resp:  18 17   Temp:  98.4 F (36.9 C) 98.2 F (36.8 C)   TempSrc:  Oral Oral   SpO2: 100% 96% 95% 93%  Weight:   (!) 136.7 kg  Height:        Intake/Output Summary (Last 24 hours) at 05/14/2018 1410 Last data filed at 05/14/2018 1000 Gross per 24 hour  Intake 840 ml  Output 4965 ml  Net -4125 ml   Filed Weights   05/12/18 0514 05/13/18 0408 05/14/18 0514  Weight: (!) 144.1 kg (!) 141.5 kg (!) 136.7 kg    Examination:  General  exam: NAD Respiratory system CTA.  Cardiovascular system: S 1, S 2 RRR Gastrointestinal system: obese  NT Central nervous system: Non focal.  Extremities: symmetric power. Plus edema Skin: no rashes.   Data Reviewed: I have personally reviewed following labs and imaging studies  CBC: Recent Labs  Lab 05/09/18 0013 05/10/18 0706 05/11/18 0404 05/12/18 0525 05/13/18 0506  WBC 6.5 5.2 4.7 4.0 4.0  HGB 13.9 14.4 14.1 14.1 13.6  HCT 48.9 50.7 49.4 49.6 47.2  MCV 98.6 98.6 98.0 96.9 96.3  PLT 219 210 196 189 190   Basic Metabolic Panel: Recent Labs  Lab 05/09/18 0013 05/10/18 0706 05/11/18 0404 05/12/18 0525 05/13/18 0805  NA 145 144 142 142 144  K 4.6 4.2 4.3 4.0 4.3  CL 102 99 99 97* 96*  CO2 31 34* 30 38* 37*  GLUCOSE 126* 120* 151* 130* 113*  BUN 21* 13 16 13 14   CREATININE 1.40* 1.12 1.16 1.12 1.10  CALCIUM 8.7* 8.9 8.7* 9.1 9.7   GFR: Estimated Creatinine Clearance: 102.2 mL/min (by C-G formula based on SCr of 1.1 mg/dL). Liver Function Tests: Recent Labs  Lab 05/08/18 0520  AST 23  ALT 24  ALKPHOS 102  BILITOT 1.2  PROT 5.9*  ALBUMIN 3.0*   No results for input(s): LIPASE, AMYLASE in the last 168 hours. No results for input(s): AMMONIA in the last 168 hours. Coagulation Profile: No results for input(s): INR, PROTIME in the last 168 hours. Cardiac Enzymes: Recent Labs  Lab 05/07/18 1823 05/07/18 2201 05/08/18 0520 05/08/18 1055  TROPONINI 0.13* 0.13* 0.12* 0.11*   BNP (last 3 results) No results for input(s): PROBNP in the last 8760 hours. HbA1C: No results for input(s): HGBA1C in the last 72 hours. CBG: Recent Labs  Lab 05/08/18 0738 05/08/18 1142 05/08/18 1610 05/08/18 2013  GLUCAP 102* 75 84 140*   Lipid Profile: No results for input(s): CHOL, HDL, LDLCALC, TRIG, CHOLHDL, LDLDIRECT in the last 72 hours. Thyroid Function Tests: No results for input(s): TSH, T4TOTAL, FREET4, T3FREE, THYROIDAB in the last 72 hours. Anemia Panel: No  results for input(s): VITAMINB12, FOLATE, FERRITIN, TIBC, IRON, RETICCTPCT in the last 72 hours. Sepsis Labs: No results for input(s): PROCALCITON, LATICACIDVEN in the last 168 hours.  Recent Results (from the past 240 hour(s))  MRSA PCR Screening     Status: None   Collection Time: 05/07/18 11:40 PM  Result Value Ref Range Status   MRSA by PCR NEGATIVE NEGATIVE Final    Comment:        The GeneXpert MRSA Assay (FDA approved for NASAL specimens only), is one component of a comprehensive MRSA colonization surveillance program. It is not intended to diagnose MRSA infection nor to guide or monitor treatment for MRSA infections. Performed at [facility], [address], [place], [place] [address]          Radiology Studies: No results found.      Scheduled Meds: . aspirin EC  81 mg Oral Daily  . atorvastatin  20 mg Oral Daily  . carvedilol  6.25 mg Oral BID WC  .  colchicine  0.6 mg Oral Daily  . furosemide  80 mg Intravenous Q12H  . gabapentin  100 mg Oral QHS  . losartan  25 mg Oral Daily  . sodium chloride flush  3 mL Intravenous Q12H   Continuous Infusions: . sodium chloride Stopped (05/11/18 1300)  . nitroGLYCERIN Stopped (05/09/18 1046)     LOS: 7 days    Time spent: 35 minutes.     Alba CoryBelkys A Ricca Melgarejo, MD [facility] Pager [phone]  If 7PM-7AM, please contact night-coverage [email or web address] Password [email or web address] 05/14/2018, 2:10 PM

## 2018-05-14 NOTE — Progress Notes (Signed)
Per RT - Current CPAP settings = 20/12.

## 2018-05-14 NOTE — Progress Notes (Signed)
Progress Note  Patient Name: Mark [name] Date of Encounter: 05/14/2018  Primary Cardiologist: Dr. Chilton Si  Subjective   Seated in bedside chair.  No chest pain or breathlessness.  Still has abdominal protuberance and leg swelling, although better than at presentation.  Not certain about baseline fluid weight.  Inpatient Medications    Scheduled Meds: . aspirin EC  81 mg Oral Daily  . atorvastatin  20 mg Oral Daily  . carvedilol  6.25 mg Oral BID WC  . colchicine  0.6 mg Oral Daily  . furosemide  80 mg Intravenous Q12H  . gabapentin  100 mg Oral QHS  . losartan  25 mg Oral Daily  . sodium chloride flush  3 mL Intravenous Q12H   Continuous Infusions: . sodium chloride Stopped (05/11/18 1300)  . heparin 1,950 Units/hr (05/14/18 1044)  . nitroGLYCERIN Stopped (05/09/18 1046)   PRN Meds: sodium chloride, acetaminophen, ALPRAZolam, hydrALAZINE, hydrOXYzine, ipratropium-albuterol, morphine injection, sodium chloride flush, zolpidem   Vital Signs    Vitals:   05/13/18 2212 05/14/18 0057 05/14/18 0514 05/14/18 0912  BP:  (!) 141/94 137/88   Pulse:  80 72   Resp:  18 17   Temp:  98.4 F (36.9 C) 98.2 F (36.8 C)   TempSrc:  Oral Oral   SpO2: 100% 96% 95% 93%  Weight:   (!) 136.7 kg   Height:        Intake/Output Summary (Last 24 hours) at 05/14/2018 1116 Last data filed at 05/14/2018 1000 Gross per 24 hour  Intake 1080 ml  Output 5465 ml  Net -4385 ml   Filed Weights   05/12/18 0514 05/13/18 0408 05/14/18 0514  Weight: (!) 144.1 kg (!) 141.5 kg (!) 136.7 kg    Telemetry    Sinus rhythm.  Personally reviewed.  ECG    Tracing from 05/07/2018 showed sinus rhythm with left bundle branch block.  Personally reviewed.  Physical Exam   GEN:  Morbidly obese male.  No acute distress.   Neck: No JVD. Cardiac: RRR, no gallop.  Respiratory: Nonlabored. Clear to auscultation bilaterally. GI:  Protuberant with pannus, nontender, bowel sounds present. MS:   Chronic appearing bilateral leg edema also and thighs; No deformity. Neuro:  Nonfocal. Psych: Alert and oriented x 3. Normal affect.  Labs    Chemistry Recent Labs  Lab 05/08/18 0520  05/11/18 0404 05/12/18 0525 05/13/18 0805  NA 146*   < > 142 142 144  K 4.0   < > 4.3 4.0 4.3  CL 105   < > 99 97* 96*  CO2 33*   < > 30 38* 37*  GLUCOSE 106*   < > 151* 130* 113*  BUN 19   < > 16 13 14   CREATININE 1.45*   < > 1.16 1.12 1.10  CALCIUM 8.9   < > 8.7* 9.1 9.7  PROT 5.9*  --   --   --   --   ALBUMIN 3.0*  --   --   --   --   AST 23  --   --   --   --   ALT 24  --   --   --   --   ALKPHOS 102  --   --   --   --   BILITOT 1.2  --   --   --   --   GFRNONAA 51*   < > >60 >60 >60  GFRAA 59*   < > >60 >  60 >60  ANIONGAP 8   < > 13 7 11    < > = values in this interval not displayed.     Hematology Recent Labs  Lab 05/11/18 0404 05/12/18 0525 05/13/18 0506  WBC 4.7 4.0 4.0  RBC 5.04 5.12 4.90  HGB 14.1 14.1 13.6  HCT 49.4 49.6 47.2  MCV 98.0 96.9 96.3  MCH 28.0 27.5 27.8  MCHC 28.5* 28.4* 28.8*  RDW 15.6* 15.1 15.4  PLT 196 189 190    Cardiac Enzymes Recent Labs  Lab 05/07/18 1823 05/07/18 2201 05/08/18 0520 05/08/18 1055  TROPONINI 0.13* 0.13* 0.12* 0.11*   No results for input(s): TROPIPOC in the last 168 hours.   BNP Recent Labs  Lab 05/07/18 1823  BNP 654.7*     Radiology    Nm Myocar Multi W/spect W/wall Motion / Ef  Result Date: 05/12/2018 CLINICAL DATA:  Acute coronary syndrome EXAM: MYOCARDIAL IMAGING WITH SPECT (REST AND PHARMACOLOGIC-STRESS - 2 DAY PROTOCOL) GATED LEFT VENTRICULAR WALL MOTION STUDY LEFT VENTRICULAR EJECTION FRACTION TECHNIQUE: Standard myocardial SPECT imaging was performed after resting intravenous injection of 30 mCi Tc-4966m tetrofosmin. Subsequently, on a second day, intravenous infusion of Lexiscan was performed under the supervision of the Cardiology staff. At peak effect of the drug, 30 mCi Tc-3966m tetrofosmin was injected  intravenously and standard myocardial SPECT imaging was performed. Quantitative gated imaging was also performed to evaluate left ventricular wall motion, and estimate left ventricular ejection fraction. COMPARISON:  None. FINDINGS: Perfusion: Dilated heart. No reversible or irreversible defects to suggest ischemia or infarction. Wall Motion: Generalized hypokinesia.  Left ventricular dilatation. Left Ventricular Ejection Fraction: 37 % End diastolic volume 213 ml End systolic volume 133 ml IMPRESSION: 1. No reversible ischemia or infarction. 2. Generalized hypokinesia.  Dilated left ventricle. 3. Left ventricular ejection fraction 37% 4. Non invasive risk stratification*: Intermediate *2012 Appropriate Use Criteria for Coronary Revascularization Focused Update: J Am Coll Cardiol. 2012;59(9):857-881. http://content.dementiazones.comonlinejacc.org/article.aspx?articleid=1201161 Electronically Signed   By: Charlett NoseKevin  Dover M.D.   On: 05/12/2018 13:11    Cardiac Studies   Echocardiogram 05/09/2018: Study Conclusions  - Left ventricle: The cavity size was normal. There was severe   hypertrophy of the septum with otherwise mild-moderate concentric   hypertrophy. Systolic function was normal. The estimated ejection   fraction was in the range of 50% to 55%. Hypokinesis of the   anteroseptal and inferoseptal myocardium. Doppler parameters are   consistent with abnormal left ventricular relaxation (grade 1   diastolic dysfunction). Doppler parameters are consistent with   indeterminate ventricular filling pressure. - Ventricular septum: The contour showed diastolic flattening   consistent with right ventricular volume overload. - Aortic valve: There was very mild stenosis. There was no   regurgitation. - Mitral valve: Transvalvular velocity was within the normal range.   There was no evidence for stenosis. There was mild regurgitation. - Right ventricle: The cavity size was moderately dilated. Wall   thickness was normal.  Systolic function was normal. - Right atrium: The atrium was moderately dilated. - Tricuspid valve: There was mild regurgitation. - Pulmonary arteries: Systolic pressure was mildly increased. PA   peak pressure: 45 mm Hg (S).  Impressions:  - Aortic valve is poorly visualized but the mean aortic valve   gradient is minimally elevated. Visually the valve does not   appear abnormal enough to cause elevated gradients. Consider   subvalvular or supravalvuluar stenosis. There is severe   hypertrophy of the basal septum which may be contributing.  Patient Profile  57 y.o. male with a history of morbid obesity, OSA, type 2 diabetes mellitus, essential hypertension, and hyperlipidemia, now admitted with acute diastolic heart failure.  Assessment & Plan    1.  Acute diastolic heart failure, LVEF 50 to 55% by echocardiogram (calculated lower by Myoview), also grade 1 diastolic dysfunction with mild pulmonary hypertension.  He is diuresing quite well on combination of Lasix and metolazone with stable renal function.  2.  Essential hypertension, currently on Cozaar and Coreg.  3.  Morbid obesity.  4.  Mild, flat elevation in troponin I not consistent with ACS.  5.  Left bundle branch block, "new" in comparison to tracing from 2012.  No active ischemia by Myoview.  Patient still appears fluid overloaded although is showing improvement with diuresis of 3300 cc last 24 hours on combination of IV Lasix and metolazone.  Follow-up creatinine is 1.16.  Would continue with current current diuretic course and observation.  Follow-up BMET in a.m.  Signed, Nona Dell, MD  05/14/2018, 11:16 AM

## 2018-05-14 NOTE — Progress Notes (Signed)
Ambulated in hallway on RA. Sats dropped to 87% after ambulating 50 ft. Recovered to 97% with rest. Back to room. Sats on RA 94% at Rest. Instructed to call prn SOB. Will monitor.  

## 2018-05-14 NOTE — Progress Notes (Signed)
Pt states he will place himself on CPAP if he decides to wear. I told him to call if he needs any further assistance

## 2018-05-15 ENCOUNTER — Inpatient Hospital Stay (HOSPITAL_COMMUNITY): Payer: Managed Care, Other (non HMO)

## 2018-05-15 LAB — BASIC METABOLIC PANEL
ANION GAP: 10 (ref 5–15)
BUN: 25 mg/dL — AB (ref 6–20)
CHLORIDE: 90 mmol/L — AB (ref 98–111)
CO2: 37 mmol/L — ABNORMAL HIGH (ref 22–32)
Calcium: 9.4 mg/dL (ref 8.9–10.3)
Creatinine, Ser: 1.33 mg/dL — ABNORMAL HIGH (ref 0.61–1.24)
GFR calc Af Amer: >60 mL/min (ref >60)
GFR calc non Af Amer: 57 mL/min — ABNORMAL LOW (ref >60)
Glucose, Bld: 177 mg/dL — ABNORMAL HIGH (ref 70–99)
Potassium: 3.9 mmol/L (ref 3.5–5.1)
SODIUM: 137 mmol/L (ref 135–145)

## 2018-05-15 MED ORDER — TORSEMIDE 20 MG PO TABS
40.0000 mg | ORAL_TABLET | Freq: Every day | ORAL | Status: DC
  Administered 2018-05-16 – 2018-05-17 (×2): 40 mg via ORAL
  Filled 2018-05-15 (×2): qty 2

## 2018-05-15 NOTE — Progress Notes (Signed)
PROGRESS NOTE    Mark [name]  NWG:[id number] DOB: 09/21/1960 DOA: 05/07/2018 PCP: Dorothyann PengSanders, Robyn, MD    Brief Narrative;57 y.o.malewith medical history significant ofhypertension, hyperlipidemia, diabetes mellitus, obesity, OSAnot on CPAP, left ear hearing loss, CKD-3, left bundle blockage, who presented with shortness of breath and bilateral leg edema.  Patient was thought to have congestive heart failure.  He was noted to have elevated troponin.  Cardiology was consulted.  Patient was hospitalized.  Patient seen by cardiology.  Underwent echocardiogram.  Started on IV diuretics.   Assessment & Plan:   Principal Problem:   Congestive heart failure (HCC) Active Problems:   LBBB (left bundle branch block)   Hyperlipidemia   Acute respiratory failure with hypoxia (HCC)   Elevated troponin   CKD (chronic kidney disease), stage III (HCC)   Hypertensive urgency  1-Acute Diastolic Heart failure;  ECHO with diastolic dysfunction.  Weight slowly decreasing. 148----146 kg---144-141---136 --131 3.8 L urine out put yesterday  Started on carvedilol, and Cozaar.  Plan to transition to oral diuretic tomorrow.   Acute hypoxic Respiratory failure;  Currently on 2 l oxygen.  Continue with IV lasix.  Pulmonary function test; moderate severe obstructive airway diseases and restrictive  lung diseases.  Continue with  nebulizer.  Needs follow up with pulmonologist  CPAP ordered   Possible NSTEMI; LBBB;  Elevation of troponin.  On aspirin, discontinue heparin  Underwent Myoview today. 2 days study.  No reversible ischemia on Myoview.   HTN urgency;  He was on nitroglycerin.   HLD;  On statins.   Obstructive Sleep Apnea; chronic respiratory failure with Hypercapnia.  -Apparently patient with severe sleep apnea symptoms.  Unable to maintain saturations without the machine and having difficulty using the CPAP machine.  -He apparently has undergone uvulopalatopharyngoplasty  previously Dr Rito EhrlichKrishnan discussed with Dr. Craige CottaSood with Pulmonology.  He recommends pursuing trilogy ventilator and an outpatient appointment with pulmonology for further management -PH 7.3, PCO2 61, PO 2 59.  -See Dr Rito EhrlichKrishnan note regarding needs for trilogy vent.  -will get PFT/  Insurance decline trilogy.  Will ask CM to order CPAP. He is tried CPAP last night.   Pain right first toe; uric acid;  Uric acid 8.4.  Start colchicine in case of pseudogout  Pain better   Morbid obesity Body mass index is 59.96 kg/m.  Left arm edema; check doppler.    DVT prophylaxis: Heparin Gtt Code Status: full code.  Family Communication: Care discussed with wife who was at bedside.  Disposition Plan: Remain inpatient for treatment of hypoxic Resp failure. IV lasix. Stress test   Consultants:   Cardiology    Procedures:   Myoview;  ECHO; Impressions: - Aortic valve is poorly visualized but the mean aortic valve gradient is minimally elevated. Visually the valve does not appear abnormal enough to cause elevated gradients. Consider subvalvular or supravalvuluar stenosis. There is severe hypertrophy of the basal septum which may be contributing.     Antimicrobials:   none   Subjective: Breathing better   Objective: Vitals:   05/14/18 2119 05/15/18 0325 05/15/18 0500 05/15/18 0739  BP: 117/77 (!) 104/51  106/80  Pulse: 74 96  77  Resp: 20 20  20   Temp: 99.5 F (37.5 C) 98.9 F (37.2 C)  98 F (36.7 C)  TempSrc: Oral Oral  Oral  SpO2: 98% 95%  90%  Weight:   131.6 kg   Height:        Intake/Output Summary (Last 24 hours) at 05/15/2018  1111 Last data filed at 05/15/2018 0800 Gross per 24 hour  Intake 720 ml  Output 2925 ml  Net -2205 ml   Filed Weights   05/13/18 0408 05/14/18 0514 05/15/18 0500  Weight: (!) 141.5 kg (!) 136.7 kg 131.6 kg    Examination:  General exam: NAD Respiratory system ; CTA Cardiovascular system: S 1, S 2 RRR Gastrointestinal  system: Obese, BS present, soft.  Central nervous system: Non focal.   Extremities: Symmetric power. Less edema Skin: no rashes.   Data Reviewed: I have personally reviewed following labs and imaging studies  CBC: Recent Labs  Lab 05/09/18 0013 05/10/18 0706 05/11/18 0404 05/12/18 0525 05/13/18 0506  WBC 6.5 5.2 4.7 4.0 4.0  HGB 13.9 14.4 14.1 14.1 13.6  HCT 48.9 50.7 49.4 49.6 47.2  MCV 98.6 98.6 98.0 96.9 96.3  PLT 219 210 196 189 190   Basic Metabolic Panel: Recent Labs  Lab 05/10/18 0706 05/11/18 0404 05/12/18 0525 05/13/18 0805 05/15/18 0843  NA 144 142 142 144 137  K 4.2 4.3 4.0 4.3 3.9  CL 99 99 97* 96* 90*  CO2 34* 30 38* 37* 37*  GLUCOSE 120* 151* 130* 113* 177*  BUN 13 16 13 14  25*  CREATININE 1.12 1.16 1.12 1.10 1.33*  CALCIUM 8.9 8.7* 9.1 9.7 9.4   GFR: Estimated Creatinine Clearance: 82.7 mL/min (A) (by C-G formula based on SCr of 1.33 mg/dL (H)). Liver Function Tests: No results for input(s): AST, ALT, ALKPHOS, BILITOT, PROT, ALBUMIN in the last 168 hours. No results for input(s): LIPASE, AMYLASE in the last 168 hours. No results for input(s): AMMONIA in the last 168 hours. Coagulation Profile: No results for input(s): INR, PROTIME in the last 168 hours. Cardiac Enzymes: No results for input(s): CKTOTAL, CKMB, CKMBINDEX, TROPONINI in the last 168 hours. BNP (last 3 results) No results for input(s): PROBNP in the last 8760 hours. HbA1C: No results for input(s): HGBA1C in the last 72 hours. CBG: Recent Labs  Lab 05/08/18 1142 05/08/18 1610 05/08/18 2013  GLUCAP 75 84 140*   Lipid Profile: No results for input(s): CHOL, HDL, LDLCALC, TRIG, CHOLHDL, LDLDIRECT in the last 72 hours. Thyroid Function Tests: No results for input(s): TSH, T4TOTAL, FREET4, T3FREE, THYROIDAB in the last 72 hours. Anemia Panel: No results for input(s): VITAMINB12, FOLATE, FERRITIN, TIBC, IRON, RETICCTPCT in the last 72 hours. Sepsis Labs: No results for input(s):  PROCALCITON, LATICACIDVEN in the last 168 hours.  Recent Results (from the past 240 hour(s))  MRSA PCR Screening     Status: None   Collection Time: 05/07/18 11:40 PM  Result Value Ref Range Status   MRSA by PCR NEGATIVE NEGATIVE Final    Comment:        The GeneXpert MRSA Assay (FDA approved for NASAL specimens only), is one component of a comprehensive MRSA colonization surveillance program. It is not intended to diagnose MRSA infection nor to guide or monitor treatment for MRSA infections. Performed at [facility], [address] [place], [place] [address]          Radiology Studies: No results found.      Scheduled Meds: . aspirin EC  81 mg Oral Daily  . atorvastatin  20 mg Oral Daily  . carvedilol  6.25 mg Oral BID WC  . colchicine  0.6 mg Oral Daily  . furosemide  80 mg Intravenous Q12H  . gabapentin  100 mg Oral QHS  . heparin injection (subcutaneous)  5,000 Units Subcutaneous Q8H  .  losartan  25 mg Oral Daily  . sodium chloride flush  3 mL Intravenous Q12H  . [START ON 05/16/2018] torsemide  40 mg Oral Daily   Continuous Infusions: . sodium chloride Stopped (05/11/18 1300)  . nitroGLYCERIN Stopped (05/09/18 1046)     LOS: 8 days    Time spent: 35 minutes.     Alba Cory, MD [facility] Pager [phone]  If 7PM-7AM, please contact night-coverage [email or web address] Password [email or web address] 05/15/2018, 11:11 AM

## 2018-05-15 NOTE — Progress Notes (Signed)
SATURATION QUALIFICATIONS: (This note is used to comply with regulatory documentation for home oxygen)  Patient Saturations on Room Air at Rest = 94%  Patient Saturations on Room Air while Ambulating = 87%  Patient Saturations on 2 Liters of oxygen while Ambulating = 95%  Please briefly explain why patient needs home oxygen: 

## 2018-05-15 NOTE — Care Management (Addendum)
Consult received for obtaining DME CPAP.  Per Mark [name] from [name], patient does not qualify for Trilogy due to results of PFT.  One parameter was missed by a very narrow margin (FVC%Pred-Post=53 and must be less than 50 to qualify).  Dr. Sunnie Nielsenegalado was notified.     Pt had a sleep study and got a CPAP from [facility] in 10/2013.  Pt states he doesn't know what happened to his CPAP but he doesn't think he has it anymore.  He states he didn't think it worked properly and stopped wearing it.  Pt's wife states they may still have the CPAP and if not, she may be willing to pay out of pocket to avoid the patient having to do another sleep study.  Wife will look for CPAP at home.  If patient does not have CPAP, will likely require another sleep study to have it covered by insurance.  [name]  CPAP cost is upwards of $1500.  [facility] could provide the CPAP at d/c if the patient is willing [facility] and agrees to pay $125/month until the sleep study is complete.  [facility]  The other option is to repeat the PFT and reassess if patient qualifies.

## 2018-05-15 NOTE — Progress Notes (Signed)
Progress Note  Patient Name: Mark [name] Date of Encounter: 05/15/2018  Primary Cardiologist: Dr. Chilton Si  Subjective   No chest pain or shortness of breath.  Feels like his swelling has continued to improve in his legs and his abdomen is less tight.  Inpatient Medications    Scheduled Meds: . aspirin EC  81 mg Oral Daily  . atorvastatin  20 mg Oral Daily  . carvedilol  6.25 mg Oral BID WC  . colchicine  0.6 mg Oral Daily  . furosemide  80 mg Intravenous Q12H  . gabapentin  100 mg Oral QHS  . heparin injection (subcutaneous)  5,000 Units Subcutaneous Q8H  . losartan  25 mg Oral Daily  . sodium chloride flush  3 mL Intravenous Q12H   Continuous Infusions: . sodium chloride Stopped (05/11/18 1300)  . nitroGLYCERIN Stopped (05/09/18 1046)   PRN Meds: sodium chloride, acetaminophen, ALPRAZolam, hydrALAZINE, hydrOXYzine, ipratropium-albuterol, morphine injection, sodium chloride flush, zolpidem   Vital Signs    Vitals:   05/14/18 2119 05/15/18 0325 05/15/18 0500 05/15/18 0739  BP: 117/77 (!) 104/51  106/80  Pulse: 74 96  77  Resp: 20 20  20   Temp: 99.5 F (37.5 C) 98.9 F (37.2 C)  98 F (36.7 C)  TempSrc: Oral Oral  Oral  SpO2: 98% 95%  90%  Weight:   131.6 kg   Height:        Intake/Output Summary (Last 24 hours) at 05/15/2018 1017 Last data filed at 05/15/2018 0800 Gross per 24 hour  Intake 720 ml  Output 2925 ml  Net -2205 ml   Filed Weights   05/13/18 0408 05/14/18 0514 05/15/18 0500  Weight: (!) 141.5 kg (!) 136.7 kg 131.6 kg    Telemetry    Sinus rhythm.  Personally reviewed.  Physical Exam   GEN:  Morbidly obese.  No acute distress.   Neck: No JVD. Cardiac: RRR, no gallop. Respiratory: Nonlabored. Clear to auscultation bilaterally. GI:  Protuberant with pannus, nontender, bowel sounds present. MS:  Improving bilateral leg edema. Neuro:  Nonfocal. Psych: Alert and oriented x 3. Normal affect.  Labs    Chemistry Recent  Labs  Lab 05/12/18 0525 05/13/18 0805 05/15/18 0843  NA 142 144 137  K 4.0 4.3 3.9  CL 97* 96* 90*  CO2 38* 37* 37*  GLUCOSE 130* 113* 177*  BUN 13 14 25*  CREATININE 1.12 1.10 1.33*  CALCIUM 9.1 9.7 9.4  GFRNONAA >60 >60 57*  GFRAA >60 >60 >60  ANIONGAP 7 11 10      Hematology Recent Labs  Lab 05/11/18 0404 05/12/18 0525 05/13/18 0506  WBC 4.7 4.0 4.0  RBC 5.04 5.12 4.90  HGB 14.1 14.1 13.6  HCT 49.4 49.6 47.2  MCV 98.0 96.9 96.3  MCH 28.0 27.5 27.8  MCHC 28.5* 28.4* 28.8*  RDW 15.6* 15.1 15.4  PLT 196 189 190    Cardiac Enzymes Recent Labs  Lab 05/08/18 1055  TROPONINI 0.11*   No results for input(s): TROPIPOC in the last 168 hours.   Radiology    No results found.  Cardiac Studies   Echocardiogram 05/09/2018: Study Conclusions  - Left ventricle: The cavity size was normal. There was severe hypertrophy of the septum with otherwise mild-moderate concentric hypertrophy. Systolic function was normal. The estimated ejection fraction was in the range of 50% to 55%. Hypokinesis of the anteroseptal and inferoseptal myocardium. Doppler parameters are consistent with abnormal left ventricular relaxation (grade 1 diastolic dysfunction). Doppler parameters are  consistent with indeterminate ventricular filling pressure. - Ventricular septum: The contour showed diastolic flattening consistent with right ventricular volume overload. - Aortic valve: There was very mild stenosis. There was no regurgitation. - Mitral valve: Transvalvular velocity was within the normal range. There was no evidence for stenosis. There was mild regurgitation. - Right ventricle: The cavity size was moderately dilated. Wall thickness was normal. Systolic function was normal. - Right atrium: The atrium was moderately dilated. - Tricuspid valve: There was mild regurgitation. - Pulmonary arteries: Systolic pressure was mildly increased. PA peak pressure: 45 mm Hg  (S).  Impressions:  - Aortic valve is poorly visualized but the mean aortic valve gradient is minimally elevated. Visually the valve does not appear abnormal enough to cause elevated gradients. Consider subvalvular or supravalvuluar stenosis. There is severe hypertrophy of the basal septum which may be contributing.  Patient Profile     57 y.o. male with a history of morbid obesity, OSA, type 2 diabetes mellitus, essential hypertension, and hyperlipidemia, now admitted with acute diastolic heart failure.  Assessment & Plan    1.  Acute diastolic heart failure with LVEF 50 to 55% by echocardiogram, grade 1 diastolic dysfunction and mild pulmonary hypertension.  Additional 2800 cc out more than in last 24 hours.  Creatinine is starting to bump up to 1.33 from 1.10.  2.  Essential hypertension, continues on Cozaar and Coreg.  3.  Morbid obesity OSA.  4.  Mild, flat elevation in troponin I not consistent with ACS.  Lexiscan Myoview on September 12 demonstrated no ischemia.  LVEF was calculated at 37%, however not confirmed by echocardiogram.  5.  Left bundle branch block, "new" in comparison to tracing from 2012.  Plan to stop IV Lasix and metolazone and convert back to oral regimen beginning tomorrow.  He was on Lasix 20 mg twice daily which was not effective.  We will start Demadex.  Otherwise continue aspirin, Coreg, Lipitor, and Cozaar.  Heparin has been discontinued.  Signed, Nona DellSamuel Socorro Kanitz, MD  05/15/2018, 10:17 AM

## 2018-05-15 NOTE — Plan of Care (Signed)
Tolerates Ambulation in hallway. VSS. CPAP at night.

## 2018-05-16 ENCOUNTER — Telehealth: Payer: Self-pay | Admitting: Physician Assistant

## 2018-05-16 ENCOUNTER — Ambulatory Visit: Payer: Medicare Other | Admitting: Cardiovascular Disease

## 2018-05-16 ENCOUNTER — Other Ambulatory Visit: Payer: Self-pay | Admitting: Physician Assistant

## 2018-05-16 ENCOUNTER — Inpatient Hospital Stay (HOSPITAL_COMMUNITY): Payer: Managed Care, Other (non HMO)

## 2018-05-16 DIAGNOSIS — I5031 Acute diastolic (congestive) heart failure: Secondary | ICD-10-CM

## 2018-05-16 DIAGNOSIS — R609 Edema, unspecified: Secondary | ICD-10-CM

## 2018-05-16 LAB — CBC
HCT: 51.7 % (ref 39.0–52.0)
HEMOGLOBIN: 15.3 g/dL (ref 13.0–17.0)
MCH: 28 pg (ref 26.0–34.0)
MCHC: 29.6 g/dL — ABNORMAL LOW (ref 30.0–36.0)
MCV: 94.5 fL (ref 78.0–100.0)
Platelets: 214 10*3/uL (ref 150–400)
RBC: 5.47 MIL/uL (ref 4.22–5.81)
RDW: 15.2 % (ref 11.5–15.5)
WBC: 5.3 10*3/uL (ref 4.0–10.5)

## 2018-05-16 LAB — BASIC METABOLIC PANEL
ANION GAP: 15 (ref 5–15)
BUN: 30 mg/dL — ABNORMAL HIGH (ref 6–20)
CALCIUM: 9.6 mg/dL (ref 8.9–10.3)
CO2: 34 mmol/L — ABNORMAL HIGH (ref 22–32)
Chloride: 91 mmol/L — ABNORMAL LOW (ref 98–111)
Creatinine, Ser: 1.31 mg/dL — ABNORMAL HIGH (ref 0.61–1.24)
GFR, EST NON AFRICAN AMERICAN: 58 mL/min — AB (ref >60)
Glucose, Bld: 157 mg/dL — ABNORMAL HIGH (ref 70–99)
Potassium: 4 mmol/L (ref 3.5–5.1)
SODIUM: 140 mmol/L (ref 135–145)

## 2018-05-16 NOTE — Progress Notes (Signed)
PROGRESS NOTE    Mark [name]  ZOX:[id number] DOB: 03/21/1961 DOA: 05/07/2018 PCP: Dorothyann PengSanders, Robyn, MD    Brief Narrative;57 y.o.malewith medical history significant ofhypertension, hyperlipidemia, diabetes mellitus, obesity, OSAnot on CPAP, left ear hearing loss, CKD-3, left bundle blockage, who presented with shortness of breath and bilateral leg edema.  Patient was thought to have congestive heart failure.  He was noted to have elevated troponin.  Cardiology was consulted.  Patient was hospitalized.  Patient seen by cardiology.  Underwent echocardiogram.  Started on IV diuretics.   Assessment & Plan:   Principal Problem:   Congestive heart failure (HCC) Active Problems:   LBBB (left bundle branch block)   Hyperlipidemia   Acute respiratory failure with hypoxia (HCC)   Elevated troponin   CKD (chronic kidney disease), stage III (HCC)   Hypertensive urgency  1-Acute Diastolic Heart failure;  ECHO with diastolic dysfunction.  Weight slowly decreasing. 148----146 kg---144-141---136 --131---129. 3.8 L urine out put yesterday  Started on carvedilol, and Cozaar.  Plan to transition to oral diuretic today. Observed on oral diuretics.   Acute hypoxic Respiratory failure;  Currently on 2 l oxygen.  Continue with IV lasix.  Pulmonary function test; moderate severe obstructive airway diseases and restrictive  lung diseases.  Continue with  nebulizer.  Needs follow up with pulmonologist  CPAP ordered   Possible NSTEMI; LBBB;  Elevation of troponin.  On aspirin, discontinue heparin  Underwent Myoview today. 2 days study.  No reversible ischemia on Myoview.   HTN urgency;  He was on nitroglycerin.   HLD;  On statins.   Obstructive Sleep Apnea; chronic respiratory failure with Hypercapnia.  -Apparently patient with severe sleep apnea symptoms.  Unable to maintain saturations without the machine and having difficulty using the CPAP machine.  -He apparently has undergone  uvulopalatopharyngoplasty previously Dr Rito EhrlichKrishnan discussed with Dr. Craige CottaSood with Pulmonology.  He recommends pursuing trilogy ventilator and an outpatient appointment with pulmonology for further management -PH 7.3, PCO2 61, PO 2 59.  -See Dr Rito EhrlichKrishnan note regarding needs for trilogy vent.  -will get PFT/  Insurance decline trilogy.  Will ask CM to order CPAP. He is tried CPAP last night.   Pain right first toe; uric acid;  Uric acid 8.4.  Start colchicine in case of pseudogout  Pain better   Morbid obesity Body mass index is 59.96 kg/m.  Left arm edema; doppler negative.    DVT prophylaxis: Heparin Gtt Code Status: full code.  Family Communication: Care discussed with wife who was at bedside.  Disposition Plan: Remain inpatient for treatment of hypoxic Resp failure. IV lasix. Stress test   Consultants:   Cardiology    Procedures:   Myoview;  ECHO; Impressions: - Aortic valve is poorly visualized but the mean aortic valve gradient is minimally elevated. Visually the valve does not appear abnormal enough to cause elevated gradients. Consider subvalvular or supravalvuluar stenosis. There is severe hypertrophy of the basal septum which may be contributing.     Antimicrobials:   none   Subjective: Denies dyspnea.    Objective: Vitals:   05/15/18 2357 05/16/18 0242 05/16/18 0439 05/16/18 1025  BP: (!) 127/92 123/77  131/85  Pulse: 97 87  99  Resp: 20 20    Temp: 98 F (36.7 C) 98.7 F (37.1 C)  98.6 F (37 C)  TempSrc: Oral Oral  Oral  SpO2: 91% 91%  95%  Weight:   129.3 kg   Height:        Intake/Output Summary (  Last 24 hours) at 05/16/2018 1437 Last data filed at 05/16/2018 1230 Gross per 24 hour  Intake 782 ml  Output -  Net 782 ml   Filed Weights   05/14/18 0514 05/15/18 0500 05/16/18 0439  Weight: (!) 136.7 kg 131.6 kg 129.3 kg    Examination:  General exam: NAD.  Respiratory system ; CTA Cardiovascular system: S 1, S 2  RRR Gastrointestinal system: BS present, soft, nt Central nervous system: non focal.  Extremities: Symmetric power.  Skin: No rashes.   Data Reviewed: I have personally reviewed following labs and imaging studies  CBC: Recent Labs  Lab 05/10/18 0706 05/11/18 0404 05/12/18 0525 05/13/18 0506 05/16/18 0346  WBC 5.2 4.7 4.0 4.0 5.3  HGB 14.4 14.1 14.1 13.6 15.3  HCT 50.7 49.4 49.6 47.2 51.7  MCV 98.6 98.0 96.9 96.3 94.5  PLT 210 196 189 190 214   Basic Metabolic Panel: Recent Labs  Lab 05/11/18 0404 05/12/18 0525 05/13/18 0805 05/15/18 0843 05/16/18 0346  NA 142 142 144 137 140  K 4.3 4.0 4.3 3.9 4.0  CL 99 97* 96* 90* 91*  CO2 30 38* 37* 37* 34*  GLUCOSE 151* 130* 113* 177* 157*  BUN 16 13 14  25* 30*  CREATININE 1.16 1.12 1.10 1.33* 1.31*  CALCIUM 8.7* 9.1 9.7 9.4 9.6   GFR: Estimated Creatinine Clearance: 83.2 mL/min (A) (by C-G formula based on SCr of 1.31 mg/dL (H)). Liver Function Tests: No results for input(s): AST, ALT, ALKPHOS, BILITOT, PROT, ALBUMIN in the last 168 hours. No results for input(s): LIPASE, AMYLASE in the last 168 hours. No results for input(s): AMMONIA in the last 168 hours. Coagulation Profile: No results for input(s): INR, PROTIME in the last 168 hours. Cardiac Enzymes: No results for input(s): CKTOTAL, CKMB, CKMBINDEX, TROPONINI in the last 168 hours. BNP (last 3 results) No results for input(s): PROBNP in the last 8760 hours. HbA1C: No results for input(s): HGBA1C in the last 72 hours. CBG: No results for input(s): GLUCAP in the last 168 hours. Lipid Profile: No results for input(s): CHOL, HDL, LDLCALC, TRIG, CHOLHDL, LDLDIRECT in the last 72 hours. Thyroid Function Tests: No results for input(s): TSH, T4TOTAL, FREET4, T3FREE, THYROIDAB in the last 72 hours. Anemia Panel: No results for input(s): VITAMINB12, FOLATE, FERRITIN, TIBC, IRON, RETICCTPCT in the last 72 hours. Sepsis Labs: No results for input(s): PROCALCITON,  LATICACIDVEN in the last 168 hours.  Recent Results (from the past 240 hour(s))  MRSA PCR Screening     Status: None   Collection Time: 05/07/18 11:40 PM  Result Value Ref Range Status   MRSA by PCR NEGATIVE NEGATIVE Final    Comment:        The GeneXpert MRSA Assay (FDA approved for NASAL specimens only), is one component of a comprehensive MRSA colonization surveillance program. It is not intended to diagnose MRSA infection nor to guide or monitor treatment for MRSA infections. Performed at [facility], [address] [place], [place] [address]          Radiology Studies: No results found.      Scheduled Meds: . aspirin EC  81 mg Oral Daily  . atorvastatin  20 mg Oral Daily  . carvedilol  6.25 mg Oral BID WC  . colchicine  0.6 mg Oral Daily  . gabapentin  100 mg Oral QHS  . heparin injection (subcutaneous)  5,000 Units Subcutaneous Q8H  . losartan  25 mg Oral Daily  . sodium chloride flush  3 mL Intravenous Q12H  . torsemide  40 mg Oral Daily   Continuous Infusions: . sodium chloride Stopped (05/11/18 1300)  . nitroGLYCERIN Stopped (05/09/18 1046)     LOS: 9 days    Time spent: 35 minutes.     Alba Cory, MD [facility] Pager [phone]  If 7PM-7AM, please contact night-coverage [email or web address] Password [facility] 05/16/2018, 2:37 PM

## 2018-05-16 NOTE — Telephone Encounter (Signed)
The pt has not be discharged from the hospital yet. Will call tomorrow.

## 2018-05-16 NOTE — Progress Notes (Signed)
RT set up patient cpap hs. 2L O2 bleed in needed. Patient is able to place himself on and off CPAP when he is ready.

## 2018-05-16 NOTE — Progress Notes (Signed)
Progress Note  Patient Name: Mark [name] Date of Encounter: 05/16/2018  Primary Cardiologist: Chilton Si, MD   Subjective   No chest pain or dyspnea  Inpatient Medications    Scheduled Meds: . aspirin EC  81 mg Oral Daily  . atorvastatin  20 mg Oral Daily  . carvedilol  6.25 mg Oral BID WC  . colchicine  0.6 mg Oral Daily  . gabapentin  100 mg Oral QHS  . heparin injection (subcutaneous)  5,000 Units Subcutaneous Q8H  . losartan  25 mg Oral Daily  . sodium chloride flush  3 mL Intravenous Q12H  . torsemide  40 mg Oral Daily   Continuous Infusions: . sodium chloride Stopped (05/11/18 1300)  . nitroGLYCERIN Stopped (05/09/18 1046)   PRN Meds: sodium chloride, acetaminophen, ALPRAZolam, hydrALAZINE, hydrOXYzine, ipratropium-albuterol, morphine injection, sodium chloride flush, zolpidem   Vital Signs    Vitals:   05/15/18 1934 05/15/18 2357 05/16/18 0242 05/16/18 0439  BP: 135/77 (!) 127/92 123/77   Pulse: 88 97 87   Resp: 18 20 20    Temp: 98.8 F (37.1 C) 98 F (36.7 C) 98.7 F (37.1 C)   TempSrc: Oral Oral Oral   SpO2: 90% 91% 91%   Weight:    129.3 kg  Height:        Intake/Output Summary (Last 24 hours) at 05/16/2018 0845 Last data filed at 05/15/2018 1658 Gross per 24 hour  Intake 400 ml  Output -  Net 400 ml   Filed Weights   05/14/18 0514 05/15/18 0500 05/16/18 0439  Weight: (!) 136.7 kg 131.6 kg 129.3 kg    Telemetry    Sinus with PVCs- Personally Reviewed  Physical Exam   GEN: No acute distress. Obese   Neck: No JVD Cardiac: RRR, no murmurs, rubs, or gallops.  Respiratory: Clear to auscultation bilaterally. GI: Soft, nontender, non-distended, abdominal wall edema MS: 1+ edema Neuro:  Nonfocal  Psych: Normal affect   Labs    Chemistry Recent Labs  Lab 05/13/18 0805 05/15/18 0843 05/16/18 0346  NA 144 137 140  K 4.3 3.9 4.0  CL 96* 90* 91*  CO2 37* 37* 34*  GLUCOSE 113* 177* 157*  BUN 14 25* 30*  CREATININE 1.10  1.33* 1.31*  CALCIUM 9.7 9.4 9.6  GFRNONAA >60 57* 58*  GFRAA >60 >60 >60  ANIONGAP 11 10 15      Hematology Recent Labs  Lab 05/12/18 0525 05/13/18 0506 05/16/18 0346  WBC 4.0 4.0 5.3  RBC 5.12 4.90 5.47  HGB 14.1 13.6 15.3  HCT 49.6 47.2 51.7  MCV 96.9 96.3 94.5  MCH 27.5 27.8 28.0  MCHC 28.4* 28.8* 29.6*  RDW 15.1 15.4 15.2  PLT 189 190 214    Patient Profile     57 y.o. male with a history of morbid obesity, OSA, type 2 diabetes mellitus, essential hypertension, and hyperlipidemia with acute diastolic heart failure.  Echocardiogram this admission showed ejection fraction 50 to 55%, mild to moderate left ventricular hypertrophy, mild diastolic dysfunction, very mild aortic stenosis, mild mitral regurgitation, moderate right ventricular enlargement, moderate right atrial enlargement and mild tricuspid regurgitation.  Assessment & Plan    1 acute on chronic diastolic congestive heart failure-patient is much improved symptomatically.  We will discontinue IV diuretics and begin Demadex 40 mg daily.  He will need fluid restriction to 1 L daily.  Low-sodium diet.  He should weigh daily and take an additional 20 mg of Demadex for weight gain of 2 to  3 pounds.  Check potassium and renal function in 1 week.  2 morbid obesity/obstructive sleep apnea-there is likely a component of right heart failure as well.  He would benefit from CPAP as an outpatient.  We also discussed the importance of weight loss.  3 hypertension-blood pressure is controlled.  Continue ARB and beta-blocker.  4 minimally elevated troponin-no clear trend and no chest pain.  Not consistent with acute coronary syndrome.  Recent nuclear study showed no ischemia.  [facility] will sign off.   Medication Recommendations:  [facility] Other recommendations (labs, testing, etc):  Bmet 1 week following DC. Follow up as an outpatient: We will arrange transition of care appointment 1 week following  discharge with APP.  Follow-up Dr. Mayford Knifeurner 3 months.  For questions or updates, please contact [facility] Please consult [email or web address] for contact info under        Signed, Olga MillersBrian Gwenyth Dingee, MD  05/16/2018, 8:45 AM

## 2018-05-16 NOTE — Progress Notes (Signed)
Occupational Therapy Treatment Patient Details Name: Mark [name] MRN: [id number] DOB: 1961/08/29 Today's Date: 05/16/2018    History of present illness Pt is a 57 y.o. male admitted 05/07/18 with SOB and BLE edema; worked up for CHF. PMH includes HTN, HLD, DM, OSA, obeskty, CKD3, L ear hearing loss, L bundle blockage.    OT comments  Pt able to recall energy conservation from previous session and demonstrating this session with sink level bath. Pt with setup with OT and completing task MOD I . Pt is at adequate level for d/c home from OT standpoint. Ot to sign off acutely.   Follow Up Recommendations  No OT follow up    Equipment Recommendations  None recommended by OT    Recommendations for Other Services      Precautions / Restrictions Precautions Precautions: None Precaution Comments: monitor O2           Mobility Bed Mobility               General bed mobility comments: in chair on arrival  Transfers Overall transfer level: Modified independent                    Balance                                           ADL either performed or assessed with clinical judgement   ADL Overall ADL's : Modified independent                                       General ADL Comments: pt reports "i feel so much better" i can move without being short of breath now     Vision       Perception     Praxis      Cognition Arousal/Alertness: Awake/alert Behavior During Therapy: WFL for tasks assessed/performed Overall Cognitive Status: Within Functional Limits for tasks assessed                                          Exercises     Shoulder Instructions       General Comments      Pertinent Vitals/ Pain       Pain Assessment: No/denies pain  Home Living                                          Prior Functioning/Environment              Frequency            Progress Toward Goals  OT Goals(current goals can now be found in the care plan section)  Progress towards OT goals: Goals met and updated - see care plan  Acute Rehab OT Goals Patient Stated Goal: to get back to work OT Goal Formulation: With patient Time For Goal Achievement: 05/16/18 Potential to Achieve Goals: Good ADL Goals Additional ADL Goal #1: pt will be aware of energy conservation strategies from handout that may be of benefit to him at home and work  Plan All goals met and  education completed, patient discharged from [facility] "6 Clicks" Daily Activity     Outcome Measure   Help from another person eating meals?: None Help from another person taking care of personal grooming?: None Help from another person toileting, which includes using toliet, bedpan, or urinal?: None Help from another person bathing (including washing, rinsing, drying)?: None Help from another person to put on and taking off regular upper body clothing?: None Help from another person to put on and taking off regular lower body clothing?: None 6 Click Score: 24    End of Session        Activity Tolerance Patient tolerated treatment well   Patient Left Other (comment)(bathroom level sponge bath on toilet sitting)   Nurse Communication Mobility status;Precautions        Time: 3013-1438 OT Time Calculation (min): 16 min  Charges: OT General Charges $OT Visit: 1 Visit OT Treatments $Self Care/Home Management : 8-22 mins   Jeri Modena, OTR/L  Acute Rehabilitation Services Pager: [phone] Office: [phone] .    Parke Poisson B 05/16/2018, 10:23 AM

## 2018-05-16 NOTE — Telephone Encounter (Signed)
New Message   Patient has a TOC appt 10/02 with Jacolyn ReedyMichele Lenze.

## 2018-05-16 NOTE — Progress Notes (Signed)
*  Preliminary Results* Left upper extremity venous duplex completed. Left upper extremity is negative for deep and superficial vein thrombosis.  05/16/2018 11:17 AM  Gertie FeyMichelle Khilynn Borntreger, MHA, RVT, RDCS, RDMS

## 2018-05-17 MED ORDER — LOSARTAN POTASSIUM 25 MG PO TABS: 25.0000 mg | ORAL_TABLET | Freq: Every day | ORAL | 0 refills | Status: DC

## 2018-05-17 MED ORDER — COLCHICINE 0.6 MG PO TABS: 0.6000 mg | ORAL_TABLET | Freq: Every day | ORAL | 0 refills | Status: DC

## 2018-05-17 MED ORDER — ASPIRIN 81 MG PO TBEC: 81.0000 mg | DELAYED_RELEASE_TABLET | Freq: Every day | ORAL | 0 refills | Status: DC

## 2018-05-17 MED ORDER — IPRATROPIUM-ALBUTEROL 0.5-2.5 (3) MG/3ML IN SOLN: 3.0000 mL | Freq: Four times a day (QID) | RESPIRATORY_TRACT | 0 refills | Status: DC | PRN

## 2018-05-17 MED ORDER — TORSEMIDE 20 MG PO TABS: 40.0000 mg | ORAL_TABLET | Freq: Every day | ORAL | 0 refills | Status: DC

## 2018-05-17 NOTE — Telephone Encounter (Signed)
The pt is being discharged from the hospital today. Will call him tomorrow. 

## 2018-05-17 NOTE — Discharge Instructions (Signed)
Heart Failure Heart failure is a condition in which the heart has trouble pumping blood because it has become weak or stiff. This means that the heart does not pump blood efficiently for the body to work well. For some people with heart failure, fluid may back up into the lungs and there may be swelling (edema) in the lower legs. Heart failure is usually a long-term (chronic) condition. It is important for you to take good care of yourself and follow the treatment plan from your health care provider. What are the causes? This condition is caused by some health problems, including:  High blood pressure (hypertension). Hypertension causes the heart muscle to work harder than normal. High blood pressure eventually causes the heart to become stiff and weak.  Coronary artery disease (CAD). CAD is the buildup of cholesterol and fat (plaques) in the arteries of the heart.  Heart attack (myocardial infarction). Injured tissue, which is caused by the heart attack, does not contract as well and the heart's ability to pump blood is weakened.  Abnormal heart valves. When the heart valves do not open and close properly, the heart muscle must pump harder to keep the blood flowing.  Heart muscle disease (cardiomyopathy or myocarditis). Heart muscle disease is damage to the heart muscle from a variety of causes, such as drug or alcohol abuse, infections, or unknown causes. These can increase the risk of heart failure.  Lung disease. When the lungs do not work properly, the heart must work harder.  What increases the risk? Risk of heart failure increases as a person ages. This condition is also more likely to develop in people who:  Are overweight.  Are male.  Smoke or chew tobacco.  Abuse alcohol or illegal drugs.  Have taken medicines that can damage the heart, such as chemotherapy drugs.  Have diabetes. ? High blood sugar (glucose) is associated with high fat (lipid) levels in the  blood. ? Diabetes can also damage tiny blood vessels that carry nutrients to the heart muscle.  Have abnormal heart rhythms.  Have thyroid problems.  Have low blood counts (anemia).  What are the signs or symptoms? Symptoms of this condition include:  Shortness of breath with activity, such as when climbing stairs.  Persistent cough.  Swelling of the feet, ankles, legs, or abdomen.  Unexplained weight gain.  Difficulty breathing when lying flat (orthopnea).  Waking from sleep because of the need to sit up and get more air.  Rapid heartbeat.  Fatigue and loss of energy.  Feeling light-headed, dizzy, or close to fainting.  Loss of appetite.  Nausea.  Increased urination during the night (nocturia).  Confusion.  How is this diagnosed? This condition is diagnosed based on:  Medical history, symptoms, and a physical exam.  Diagnostic tests, which may include: ? Echocardiogram. ? Electrocardiogram (ECG). ? Chest X-ray. ? Blood tests. ? Exercise stress test. ? Radionuclide scans. ? Cardiac catheterization and angiogram.  How is this treated? Treatment for this condition is aimed at managing the symptoms of heart failure. Medicines, behavioral changes, or other treatments may be necessary to treat heart failure. Medicines These may include:  Angiotensin-converting enzyme (ACE) inhibitors. This type of medicine blocks the effects of a blood protein called angiotensin-converting enzyme. ACE inhibitors relax (dilate) the blood vessels and help to lower blood pressure.  Angiotensin receptor blockers (ARBs). This type of medicine blocks the actions of a blood protein called angiotensin. ARBs dilate the blood vessels and help to lower blood pressure.  Water pills (diuretics). Diuretics cause the kidneys to remove salt and water from the blood. The extra fluid is removed through urination, leaving a lower volume of blood that the heart has to pump.  Beta blockers.  These improve heart muscle strength and they prevent the heart from beating too quickly.  Digoxin. This increases the force of the heartbeat.  Healthy behavior changes These may include:  Reaching and maintaining a healthy weight.  Stopping smoking or chewing tobacco.  Eating heart-healthy foods.  Limiting or avoiding alcohol.  Stopping use of street drugs (illegal drugs).  Physical activity.  Other treatments These may include:  Surgery to open blocked coronary arteries or repair damaged heart valves.  Placement of a biventricular pacemaker to improve heart muscle function (cardiac resynchronization therapy). This device paces both the right ventricle and left ventricle.  Placement of a device to treat serious abnormal heart rhythms (implantable cardioverter defibrillator, or ICD).  Placement of a device to improve the pumping ability of the heart (left ventricular assist device, or LVAD).  Heart transplant. This can cure heart failure, and it is considered for certain patients who do not improve with other therapies.  Follow these instructions at home: Medicines  Take over-the-counter and prescription medicines only as told by your health care provider. Medicines are important in reducing the workload of your heart, slowing the progression of heart failure, and improving your symptoms. ? Do not stop taking your medicine unless your health care provider told you to do that. ? Do not skip any dose of medicine. ? Refill your prescriptions before you run out of medicine. You need your medicines every day. Eating and drinking   Eat heart-healthy foods. Talk with a dietitian to make an eating plan that is right for you. ? Choose foods that contain no trans fat and are low in saturated fat and cholesterol. Healthy choices include fresh or frozen fruits and vegetables, fish, lean meats, legumes, fat-free or low-fat dairy products, and whole-grain or high-fiber foods. ? Limit  salt (sodium) if directed by your health care provider. Sodium restriction may reduce symptoms of heart failure. Ask a dietitian to recommend heart-healthy seasonings. ? Use healthy cooking methods instead of frying. Healthy methods include roasting, grilling, broiling, baking, poaching, steaming, and stir-frying.  Limit your fluid intake if directed by your health care provider. Fluid restriction may reduce symptoms of heart failure. Lifestyle  Stop smoking or using chewing tobacco. Nicotine and tobacco can damage your heart and your blood vessels. Do not use nicotine gum or patches before talking to your health care provider.  Limit alcohol intake to no more than 1 drink per day for non-pregnant women and 2 drinks per day for men. One drink equals 12 oz of beer, 5 oz of wine, or 1 oz of hard liquor. ? Drinking more than that is harmful to your heart. Tell your health care provider if you drink alcohol several times a week. ? Talk with your health care provider about whether any level of alcohol use is safe for you. ? If your heart has already been damaged by alcohol or you have severe heart failure, drinking alcohol should be stopped completely.  Stop use of illegal drugs.  Lose weight if directed by your health care provider. Weight loss may reduce symptoms of heart failure.  Do moderate physical activity if directed by your health care provider. People who are elderly and people with severe heart failure should consult with a health care provider for physical activity  recommendations. Monitor important information  Weigh yourself every day. Keeping track of your weight daily helps you to notice excess fluid sooner. ? Weigh yourself every morning after you urinate and before you eat breakfast. ? Wear the same amount of clothing each time you weigh yourself. ? Record your daily weight. Provide your health care provider with your weight record.  Monitor and record your blood pressure as  told by your health care provider.  Check your pulse as told by your health care provider. Dealing with extreme temperatures  If the weather is extremely hot: ? Avoid vigorous physical activity. ? Use air conditioning or fans or seek a cooler location. ? Avoid caffeine and alcohol. ? Wear loose-fitting, lightweight, and light-colored clothing.  If the weather is extremely cold: ? Avoid vigorous physical activity. ? Layer your clothes. ? Wear mittens or gloves, a hat, and a scarf when you go outside. ? Avoid alcohol. General instructions  Manage other health conditions such as hypertension, diabetes, thyroid disease, or abnormal heart rhythms as told by your health care provider.  Learn to manage stress. If you need help to do this, ask your health care provider.  Plan rest periods when fatigued.  Get ongoing education and support as needed.  Participate in or seek rehabilitation as needed to maintain or improve independence and quality of life.  Stay up to date with immunizations. Keeping current on pneumococcal and influenza immunizations is especially important to prevent respiratory infections.  Keep all follow-up visits as told by your health care provider. This is important. Contact a health care provider if:  You have a rapid weight gain.  You have increasing shortness of breath that is unusual for you.  You are unable to participate in your usual physical activities.  You tire easily.  You cough more than normal, especially with physical activity.  You have any swelling or more swelling in areas such as your hands, feet, ankles, or abdomen.  You are unable to sleep because it is hard to breathe.  You feel like your heart is beating quickly (palpitations).  You become dizzy or light-headed when you stand up. Get help right away if:  You have difficulty breathing.  You notice or your family notices a change in your awareness, such as having trouble staying  awake or having difficulty with concentration.  You have pain or discomfort in your chest.  You have an episode of fainting (syncope). This information is not intended to replace advice given to you by your health care provider. Make sure you discuss any questions you have with your health care provider. Document Released: 08/17/2005 Document Revised: 04/21/2016 Document Reviewed: 03/11/2016 Elsevier Interactive Patient Education  2018 [employer]   Living With Heart Failure  Heart failure is a long-term (chronic) condition in which the heart cannot pump enough blood through the body. When this happens, parts of the body do not get the blood and oxygen they need. There is no cure for heart failure at this time, so it is important for you to take good care of yourself and follow the treatment plan set by your health care provider. If you are living with heart failure, there are ways to help you manage the disease. Follow these instructions at home: Living with heart failure requires you to make changes in your life. Your health care team will teach you about the changes you need to make in order to relieve your symptoms and lower your risk of going to the hospital.  Follow the treatment plan as set by your health care provider. Medicines Medicines are important in reducing your heart's workload, slowing the progression of heart failure, and improving your symptoms.  Take over-the-counter and prescription medicines only as told by your health care provider.  Do not stop taking your medicine unless your health care provider tells you to do that.  Do not skip any dose of your medicine.  Refill prescriptions before you run out of medicine. You need your medicines every day.  Eating and drinking  Eat heart-healthy foods. Talk with a dietitian to make an eating plan that is right for you. ? If directed by your health care provider: ? Limit salt (sodium). Lowering your sodium intake may  reduce symptoms of heart failure. Ask a dietitian to recommend heart-healthy seasonings. ? Limit your fluid intake. Fluid restriction may reduce symptoms of heart failure. ? Use low-fat cooking methods instead of frying. Low-fat methods include roasting, grilling, broiling, baking, poaching, steaming, and stir-frying. ? Choose foods that contain no trans fat and are low in saturated fat and cholesterol. Healthy choices include fresh or frozen fruits and vegetables, fish, lean meats, legumes, fat-free or low-fat dairy products, and whole-grain or high-fiber foods.  Limit alcohol intake to no more than 1 drink a day for nonpregnant women and 2 drinks a day for men. One drink equals 12 oz of beer, 5 oz of wine, or 1 oz of hard liquor. ? Drinking more than that is harmful to your heart. Tell your health care provider if you drink alcohol several times a week. ? Talk with your health care provider about whether any level of alcohol use is safe for you. Activity  Ask your health care provider about attending cardiac rehabilitation. These programs include aerobic physical activity, which provides many benefits for your heart.  If no cardiac rehabilitation program is available, ask your health care provider what aerobic exercises are safe for you to do. Lifestyle Make the lifestyle changes recommended by your health care provider. In general:  Lose weight if your health care provider tells you to do that. Weight loss may reduce symptoms of heart failure.  Do not use any products that contain nicotine or tobacco, such as cigarettes or e-cigarettes. If you need help quitting, ask your health care provider.  Do not use street (illegal) drugs.  Return to your normal activities as told by your health care provider. Ask your health care provider what activities are safe for you.  General instructions  Make sure you weigh yourself every day to track your weight. Rapid weight gain may indicate an increase  in fluid in your body and may increase the workload of your heart. ? Weigh yourself every morning. Do this after you urinate but before you eat breakfast. ? Wear the same type of clothing, without shoes, each time you weigh yourself. ? Weigh yourself on the same scale and in the same spot each time.  Living with chronic heart failure often leads to emotions such as fear, stress, anxiety, and depression. If you feel any of these emotions and need help coping, contact your health care provider. Other ways to get help include: ? Talking to friends and family members about your condition. They can give you support and guidance. Explain your symptoms to them and, if comfortable, invite them to attend appointments or rehabilitation with you. ? Joining a support group for people with chronic heart failure. Talking with other people who have the same symptoms may give you  new ways of coping with your disease and your emotions.  Stay up to date with your shots (vaccines). Staying current on pneumococcal and influenza vaccines is especially important in preventing germs from attacking your airways (respiratory infections).  Keep all follow-up visits as told by your health care provider. This is important. How to recognize changes in your condition You and your family members need to know what changes to watch for in your condition. Watch for the following changes and report them to your health care provider:  Sudden weight gain. Ask your health care provider what amount of weight gain to report.  Shortness of breath: ? Feeling short of breath while at rest, with no exercise or activity that required great effort. ? Feeling breathless with activity.  Swelling of your lower legs or ankles.  Difficulty sleeping: ? You wake up feeling short of breath. ? You have to use more pillows to raise your head in order to sleep.  Frequent, dry, hacking cough.  Loss of appetite.  Feeling more tired all the  time.  Depression or feelings of sadness or hopelessness.  Bloating in the stomach.  Where to find more information  Local support groups. Ask your health care provider about groups near you.  The American Heart Association: www.heart.org Contact a health care provider if:  You have a rapid weight gain.  You have increasing shortness of breath that is unusual for you.  You are unable to participate in your usual physical activities.  You tire easily.  You cough more than normal, especially with physical activity.  You have any swelling or more swelling in areas such as your hands, feet, ankles, or abdomen.  You feel like your heart is beating quickly (palpitations).  You become dizzy or light-headed when you stand up. Get help right away if:  You have difficulty breathing.  You notice or your family notices a change in your awareness, such as having trouble staying awake or having difficulty with concentration.  You have pain or discomfort in your chest.  You have an episode of fainting (syncope). Summary  There is no cure for heart failure, so it is important for you to take good care of yourself and follow the treatment plan set by your health care provider.  Medicines are important in reducing your heart's workload, slowing the progression of heart failure, and improving your symptoms.  Living with chronic heart failure often leads to emotions such as fear, stress, anxiety, and depression. If you are feeling any of these emotions and need help coping, contact your health care provider. This information is not intended to replace advice given to you by your health care provider. Make sure you discuss any questions you have with your health care provider. Document Released: 12/30/2016 Document Revised: 12/30/2016 Document Reviewed: 12/30/2016 Elsevier Interactive Patient Education  2018 [employer] yourself every day. If your weight increase more than 3  pounds in 24 hours take extra dose of torsemide (20 mg )

## 2018-05-17 NOTE — Discharge Summary (Signed)
Physician Discharge Summary  Mark [name] ZOX:[id number] DOB: 1961-03-06 DOA: 05/07/2018  PCP: Dorothyann Peng, MD  Admit date: 05/07/2018 Discharge date: 05/17/2018  Admitted From: Home  Disposition: Home   Recommendations for Outpatient Follow-up:  1. Follow up with PCP in 1-2 weeks 2. Please obtain BMP/CBC in one week 3. He will need sleep study.  4. Needs to follow up, with pulmonologist for moderate severe obstructive airway diseases and restrictive  lung diseases.  5. Follow up with cardiologist   Home Health: yes.   Discharge Condition: stable.  CODE STATUS; full code.  Diet recommendation: Heart Healthy  Brief/Interim Summary:  Brief Narrative;57 y.o.malewith medical history significant ofhypertension, hyperlipidemia, diabetes mellitus, obesity, OSAnot on CPAP, left ear hearing loss, CKD-3, left bundle blockage, who presented with shortness of breath and bilateral leg edema. Patient was thought to have congestive heart failure. He was noted to have elevated troponin. Cardiology was consulted. Patient was hospitalized.Patient seen by cardiology. Underwent echocardiogram. Started on IV diuretics.   Assessment & Plan:   Principal Problem:   Congestive heart failure (HCC) Active Problems:   LBBB (left bundle branch block)   Hyperlipidemia   Acute respiratory failure with hypoxia (HCC)   Elevated troponin   CKD (chronic kidney disease), stage III (HCC)   Hypertensive urgency  1-Acute Diastolic Heart failure;  ECHO with diastolic dysfunction.  Weight slowly decreasing. 148----146 kg---144-141---136 --131---129. 3.8 L urine out put yesterday  Started on carvedilol, and Cozaar.  Tolerating torsemide. weight down 2 pounds. Stable for discharge   Acute hypoxic Respiratory failure;  Currently on 2 l oxygen.  Continue with IV lasix.  Pulmonary function test; moderate severe obstructive airway diseases and restrictive  lung diseases.  Continue with   nebulizer.  Needs follow up with pulmonologist  CPAP ordered   Possible NSTEMI; LBBB;  Elevation of troponin.  On aspirin, discontinue heparin  Underwent Myoview today. 2 days study.  No reversible ischemia on Myoview.   HTN urgency;  He was on nitroglycerin.   HLD;  On statins.   Obstructive Sleep Apnea; chronic respiratory failure with Hypercapnia.  -Apparently patient with severe sleep apnea symptoms. -He apparently has undergone uvulopalatopharyngoplasty previously Dr Rito Ehrlich discussed with Dr. Craige Cotta with Pulmonology. He recommends pursuing trilogy ventilator and an outpatient appointment with pulmonology for further management. Insurance decline trilogy.  -PH 7.3, PCO2 61, PO 2 59.  - PFT/  -has CPAP at home  patient has been tolerating CPAP in the hospital. He will be discharge on CPAP and oxygen 2 l.   Pain right first toe; uric acid;  Uric acid 8.4.  Start colchicine in case of pseudogout  Pain better   Morbid obesity Body mass index is 59.96 kg/m.  Left arm edema; doppler negative.      Discharge Diagnoses:  Principal Problem:   Congestive heart failure (HCC) Active Problems:   LBBB (left bundle branch block)   Hyperlipidemia   Acute respiratory failure with hypoxia (HCC)   Elevated troponin   CKD (chronic kidney disease), stage III (HCC)   Hypertensive urgency    Discharge Instructions  Discharge Instructions    Diet - low sodium heart healthy   Complete by:  As directed    Increase activity slowly   Complete by:  As directed      Allergies as of 05/17/2018   No Known Allergies     Medication List    STOP taking these medications   furosemide 20 MG tablet Commonly known as:  LASIX  TAKE these medications   aspirin 81 MG EC tablet Take 1 tablet (81 mg total) by mouth daily. Start taking on:  05/18/2018   atorvastatin 20 MG tablet Commonly known as:  LIPITOR Take 20 mg by mouth daily.   carvedilol 6.25 MG  tablet Commonly known as:  COREG Take 6.25 mg by mouth 2 (two) times daily with a meal.   colchicine 0.6 MG tablet Take 1 tablet (0.6 mg total) by mouth daily. Start taking on:  05/18/2018   gabapentin 100 MG capsule Commonly known as:  NEURONTIN Take 1 capsule (100 mg total) by mouth at bedtime.   ipratropium-albuterol 0.5-2.5 (3) MG/3ML Soln Commonly known as:  DUONEB Take 3 mLs by nebulization every 6 (six) hours as needed.   losartan 25 MG tablet Commonly known as:  COZAAR Take 1 tablet (25 mg total) by mouth daily. Start taking on:  05/18/2018   metFORMIN 500 MG tablet Commonly known as:  GLUCOPHAGE Take 500 mg by mouth 2 (two) times daily with a meal.   torsemide 20 MG tablet Commonly known as:  DEMADEX Take 2 tablets (40 mg total) by mouth daily. Start taking on:  05/18/2018            Durable Medical Equipment  (From admission, onward)         Start     Ordered   05/15/18 0736  For home use only DME continuous positive airway pressure (CPAP)  Once    Comments:  Setting 20/12  Question Answer Comment  Patient has OSA or probable OSA Yes   Settings Other see comments   CPAP supplies needed Mask, headgear, cushions, filters, heated tubing and water chamber      05/15/18 0735   05/13/18 0742  For home use only DME oxygen  Once    Question Answer Comment  Mode or (Route) Nasal cannula   Liters per Minute 2   Frequency Continuous (stationary and portable oxygen unit needed)   Oxygen delivery system Gas      05/13/18 0741         Follow-up Information    [facility] - Dme Follow up.   Why:  Oxygen Contact information: [address] [place] [address] [phone]        Dyann KiefLenze, Michele M, PA-C Follow up on 06/01/2018.   Specialty:  Cardiology Why:  noon for [facility] Contact information: [address] [place] [address] [phone]        Quintella Reicherturner, Traci R, MD Follow up on 08/01/2018.   Specialty:   Cardiology Why:  11:00 am  Contact information: [address] [place] [address] [phone]        Coralyn HellingSood, Vineet, MD Follow up in 1 week(s).   Specialty:  Pulmonary Disease Why:  call office and schedule appoint,ent  Contact information: [address] [place] [address] [phone]          No Known Allergies  Consultations:  Cardiology    Procedures/Studies: Dg Chest 2 View  Result Date: 05/07/2018 CLINICAL DATA:  Shortness of breath. Hypoxia. Fluid retention with bilateral lower extremity swelling for 2 months. EXAM: CHEST - 2 VIEW COMPARISON:  None. FINDINGS: Moderate cardiomegaly. Pulmonary vascular congestion is noted, however there is no evidence of frank pulmonary edema or pleural effusion. Lordotic positioning noted. IMPRESSION: Cardiomegaly and pulmonary vascular congestion. No evidence of frank pulmonary edema or consolidation. Electronically Signed   By: Alver SorrowJohn  Stahl M.D.  On: 05/07/2018 20:04   Nm Myocar Multi W/spect W/wall Motion / Ef  Result Date: 05/12/2018 CLINICAL DATA:  Acute coronary syndrome EXAM: MYOCARDIAL IMAGING WITH SPECT (REST AND PHARMACOLOGIC-STRESS - 2 DAY PROTOCOL) GATED LEFT VENTRICULAR WALL MOTION STUDY LEFT VENTRICULAR EJECTION FRACTION TECHNIQUE: Standard myocardial SPECT imaging was performed after resting intravenous injection of 30 mCi Tc-68m tetrofosmin. Subsequently, on a second day, intravenous infusion of Lexiscan was performed under the supervision of the Cardiology staff. At peak effect of the drug, 30 mCi Tc-20m tetrofosmin was injected intravenously and standard myocardial SPECT imaging was performed. Quantitative gated imaging was also performed to evaluate left ventricular wall motion, and estimate left ventricular ejection fraction. COMPARISON:  None. FINDINGS: Perfusion: Dilated heart. No reversible or irreversible defects to suggest ischemia or infarction. Wall Motion: Generalized hypokinesia.  Left  ventricular dilatation. Left Ventricular Ejection Fraction: 37 % End diastolic volume 213 ml End systolic volume 133 ml IMPRESSION: 1. No reversible ischemia or infarction. 2. Generalized hypokinesia.  Dilated left ventricle. 3. Left ventricular ejection fraction 37% 4. Non invasive risk stratification*: Intermediate *2012 Appropriate Use Criteria for Coronary Revascularization Focused Update: J Am Coll Cardiol. 2012;59(9):857-881. http://content.dementiazones.com.aspx?articleid=[email or web address] Electronically Signed   By: Charlett Nose M.D.   On: 05/12/2018 13:11   Dg Foot Complete Left  Result Date: 05/09/2018 Please see detailed radiograph report in office note.  Dg Foot Complete Right  Result Date: 05/09/2018 Please see detailed radiograph report in office note.     Subjective: Breathing better   Discharge Exam: Vitals:   05/17/18 0541 05/17/18 0802  BP: 136/90 (!) 152/92  Pulse: 94 77  Resp: 20   Temp: 98 F (36.7 C) 98 F (36.7 C)  SpO2: 95% 95%   Vitals:   05/16/18 2024 05/17/18 0024 05/17/18 0541 05/17/18 0802  BP:  133/88 136/90 (!) 152/92  Pulse: 89 76 94 77  Resp: 20 20 20    Temp: 98.8 F (37.1 C) 98.5 F (36.9 C) 98 F (36.7 C) 98 F (36.7 C)  TempSrc: Oral Oral Oral   SpO2: 95% 97% 95% 95%  Weight:   128.5 kg   Height:        General: Pt is alert, awake, not in acute distress Cardiovascular: RRR, S1/S2 +, no rubs, no gallops Respiratory: CTA bilaterally, no wheezing, no rhonchi Abdominal: Soft, NT, ND, bowel sounds + Extremities: no edema, no cyanosis    The results of significant diagnostics from this hospitalization (including imaging, microbiology, ancillary and laboratory) are listed below for reference.     Microbiology: Recent Results (from the past 240 hour(s))  MRSA PCR Screening     Status: None   Collection Time: 05/07/18 11:40 PM  Result Value Ref Range Status   MRSA by PCR NEGATIVE NEGATIVE Final    Comment:        The GeneXpert MRSA  Assay (FDA approved for NASAL specimens only), is one component of a comprehensive MRSA colonization surveillance program. It is not intended to diagnose MRSA infection nor to guide or monitor treatment for MRSA infections. Performed at [facility], [address], [place], [place] [address]      Labs: BNP (last 3 results) Recent Labs    05/07/18 1823  BNP 654.7*   Basic Metabolic Panel: Recent Labs  Lab 05/11/18 0404 05/12/18 0525 05/13/18 0805 05/15/18 0843 05/16/18 0346  NA 142 142 144 137 140  K 4.3 4.0 4.3 3.9 4.0  CL 99 97* 96* 90* 91*  CO2 30 38* 37*  37* 34*  GLUCOSE 151* 130* 113* 177* 157*  BUN 16 13 14  25* 30*  CREATININE 1.16 1.12 1.10 1.33* 1.31*  CALCIUM 8.7* 9.1 9.7 9.4 9.6   Liver Function Tests: No results for input(s): AST, ALT, ALKPHOS, BILITOT, PROT, ALBUMIN in the last 168 hours. No results for input(s): LIPASE, AMYLASE in the last 168 hours. No results for input(s): AMMONIA in the last 168 hours. CBC: Recent Labs  Lab 05/11/18 0404 05/12/18 0525 05/13/18 0506 05/16/18 0346  WBC 4.7 4.0 4.0 5.3  HGB 14.1 14.1 13.6 15.3  HCT 49.4 49.6 47.2 51.7  MCV 98.0 96.9 96.3 94.5  PLT 196 189 190 214   Cardiac Enzymes: No results for input(s): CKTOTAL, CKMB, CKMBINDEX, TROPONINI in the last 168 hours. BNP: Invalid input(s): POCBNP CBG: No results for input(s): GLUCAP in the last 168 hours. D-Dimer No results for input(s): DDIMER in the last 72 hours. Hgb A1c No results for input(s): HGBA1C in the last 72 hours. Lipid Profile No results for input(s): CHOL, HDL, LDLCALC, TRIG, CHOLHDL, LDLDIRECT in the last 72 hours. Thyroid function studies No results for input(s): TSH, T4TOTAL, T3FREE, THYROIDAB in the last 72 hours.  Invalid input(s): FREET3 Anemia work up No results for input(s): VITAMINB12, FOLATE, FERRITIN, TIBC, IRON, RETICCTPCT in the last 72 hours. Urinalysis    Component Value Date/Time   COLORURINE YELLOW 01/07/2008  1805   APPEARANCEUR CLOUDY (A) 01/07/2008 1805   LABSPEC 1.019 01/07/2008 1805   PHURINE 5.0 01/07/2008 1805   GLUCOSEU NEGATIVE 01/07/2008 1805   HGBUR SMALL (A) 01/07/2008 1805   BILIRUBINUR SMALL (A) 01/07/2008 1805   KETONESUR NEGATIVE 01/07/2008 1805   PROTEINUR 30 (A) 01/07/2008 1805   UROBILINOGEN 0.2 01/07/2008 1805   NITRITE NEGATIVE 01/07/2008 1805   LEUKOCYTESUR NEGATIVE 01/07/2008 1805   Sepsis Labs Invalid input(s): PROCALCITONIN,  WBC,  LACTICIDVEN Microbiology Recent Results (from the past 240 hour(s))  MRSA PCR Screening     Status: None   Collection Time: 05/07/18 11:40 PM  Result Value Ref Range Status   MRSA by PCR NEGATIVE NEGATIVE Final    Comment:        The GeneXpert MRSA Assay (FDA approved for NASAL specimens only), is one component of a comprehensive MRSA colonization surveillance program. It is not intended to diagnose MRSA infection nor to guide or monitor treatment for MRSA infections. Performed at [facility], [address], [place], [place] [address]      Time coordinating discharge: 35 minutes   SIGNED:   Alba Cory, MD  [facility] 05/17/2018, 9:19 AM Pager   If 7PM-7AM, please contact night-coverage [email or web address] Password [email or web address]

## 2018-05-18 NOTE — Telephone Encounter (Signed)
TCM Call-1st attempt: I left a message on the pts VM asking him to call us back. 

## 2018-05-19 NOTE — Telephone Encounter (Signed)
[facility].  Patient understands to follow up with provider Chelsea AusVin Bhagat on 05/30/18 [facility] Patient understands discharge instructions? yes Patient understands medications and regiment? yes Patient understands to bring all medications to this visit? yes

## 2018-05-23 ENCOUNTER — Ambulatory Visit: Payer: Medicare Other | Admitting: Podiatry

## 2018-05-30 ENCOUNTER — Encounter: Payer: Self-pay | Admitting: Physician Assistant

## 2018-05-30 ENCOUNTER — Ambulatory Visit (INDEPENDENT_AMBULATORY_CARE_PROVIDER_SITE_OTHER): Payer: Managed Care, Other (non HMO) | Admitting: Physician Assistant

## 2018-05-30 VITALS — BP 112/76 | HR 77 | Ht 71.0 in | Wt 269.0 lb

## 2018-05-30 DIAGNOSIS — Z9989 Dependence on other enabling machines and devices: Secondary | ICD-10-CM

## 2018-05-30 DIAGNOSIS — G4733 Obstructive sleep apnea (adult) (pediatric): Secondary | ICD-10-CM

## 2018-05-30 DIAGNOSIS — N183 Chronic kidney disease, stage 3 unspecified: Secondary | ICD-10-CM

## 2018-05-30 DIAGNOSIS — Z79899 Other long term (current) drug therapy: Secondary | ICD-10-CM

## 2018-05-30 DIAGNOSIS — I5032 Chronic diastolic (congestive) heart failure: Secondary | ICD-10-CM | POA: Diagnosis not present

## 2018-05-30 DIAGNOSIS — I10 Essential (primary) hypertension: Secondary | ICD-10-CM

## 2018-05-30 LAB — BASIC METABOLIC PANEL
BUN / CREAT RATIO: 21 — AB (ref 9–20)
BUN: 23 mg/dL (ref 6–24)
CO2: 25 mmol/L (ref 20–29)
CREATININE: 1.1 mg/dL (ref 0.76–1.27)
Calcium: 9.9 mg/dL (ref 8.7–10.2)
Chloride: 100 mmol/L (ref 96–106)
GFR, EST AFRICAN AMERICAN: 84 mL/min/{1.73_m2} (ref >59)
GFR, EST NON AFRICAN AMERICAN: 73 mL/min/{1.73_m2} (ref >59)
Glucose: 90 mg/dL (ref 65–99)
Potassium: 4.2 mmol/L (ref 3.5–5.2)
Sodium: 141 mmol/L (ref 134–144)

## 2018-05-30 NOTE — Patient Instructions (Addendum)
Medication Instructions:  Your physician recommends that you continue on your current medications as directed. Please refer to the Current Medication list given to you today.   Labwork: TODAY: BMET  Testing/Procedures: None ordered  Follow-Up: Keep appointment with Dr. Mayford Knife on 08/01/18 at 11:00 AM   Any Other Special Instructions Will Be Listed Below  Follow-up wth Dr. Craige Cotta. [phone]    If you need a refill on your cardiac medications before your next appointment, please call your pharmacy.

## 2018-05-30 NOTE — Progress Notes (Signed)
Cardiology Office Note    Date:  05/30/2018   ID:  Mark [name], DOB 07-Nov-1960, MRN [id number]  PCP:  Dorothyann Peng, MD  Cardiologist:  Dr. Mayford Knife  Chief Complaint: Hospital follow up   History of Present Illness:   Mark [name] is a 57 y.o. male with a history of type 2 diabetes mellitus, hypertension, morbid obesity, hyperlipidemia, medical noncompliance and obstructive sleep apnea presents for hospital follow up.   Admitted 05/2018 for CHF. Echo showed ejection fraction 50 to 55%, mild to moderate left ventricular hypertrophy, mild diastolic dysfunction, very mild aortic stenosis, mild mitral regurgitation, moderate right ventricular enlargement, moderate right atrial enlargement and mild tricuspid regurgitation. EKG with LBBB"" stress test intermediate risk due to low LVEF (however normal by echo). No ischemia or infraction. He was diuresed and discharged on Demadex. Advised to follow up with with pulmonologist for moderate severe obstructive airway diseases and restrictive lung diseases.  Here today for follow up. Compliant with medications. Use low sodium diet. Use CPAP/oxygen at night. The patient denies nausea, vomiting, fever, chest pain, palpitations, shortness of breath, orthopnea, PND, dizziness, syncope, cough, congestion, abdominal pain, hematochezia, melena, lower extremity edema. He got nebulizer but does not have machine.   Past Medical History:  Diagnosis Date  . Diabetes mellitus without complication (HCC)   . Hearing impaired   . Hypertension   . Sleep apnea     Past Surgical History:  Procedure Laterality Date  . BACK SURGERY    . TRACHEOSTOMY      Current Medications: Prior to Admission medications   Medication Sig Start Date End Date Taking? Authorizing Provider  aspirin EC 81 MG EC tablet Take 1 tablet (81 mg total) by mouth daily. 05/18/18  Yes Regalado, Belkys A, MD  atorvastatin (LIPITOR) 20 MG tablet Take 20 mg by mouth daily.   Yes  [provider]  carvedilol (COREG) 6.25 MG tablet Take 6.25 mg by mouth 2 (two) times daily with a meal.   Yes [provider]  colchicine 0.6 MG tablet Take 1 tablet (0.6 mg total) by mouth daily. 05/18/18  Yes Regalado, Belkys A, MD  gabapentin (NEURONTIN) 100 MG capsule Take 1 capsule (100 mg total) by mouth at bedtime. 04/25/18  Yes Vivi Barrack, DPM  ipratropium-albuterol (DUONEB) 0.5-2.5 (3) MG/3ML SOLN Take 3 mLs by nebulization every 6 (six) hours as needed. 05/17/18  Yes Regalado, Belkys A, MD  losartan (COZAAR) 25 MG tablet Take 1 tablet (25 mg total) by mouth daily. 05/18/18  Yes Regalado, Belkys A, MD  metFORMIN (GLUCOPHAGE) 500 MG tablet Take 500 mg by mouth 2 (two) times daily with a meal.   Yes [provider]  torsemide (DEMADEX) 20 MG tablet Take 2 tablets (40 mg total) by mouth daily. 05/18/18  Yes Regalado, Belkys A, MD    Allergies:   Patient has no known allergies.   Social History   Socioeconomic History  . Marital status: Married    Spouse name: Not on file  . Number of children: Not on file  . Years of education: Not on file  . Highest education level: Not on file  Occupational History  . Not on file  Social Needs  . Financial resource strain: Not on file  . Food insecurity:    Worry: Not on file    Inability: Not on file  . Transportation needs:    Medical: Not on file    Non-medical: Not on file  Tobacco Use  .  Smoking status: Never Smoker  . Smokeless tobacco: Never Used  Substance and Sexual Activity  . Alcohol use: No  . Drug use: No  . Sexual activity: Not on file  Lifestyle  . Physical activity:    Days per week: Not on file    Minutes per session: Not on file  . Stress: Not on file  Relationships  . Social connections:    Talks on phone: Not on file    Gets together: Not on file    Attends religious service: Not on file    Active member of club or organization: Not on file    Attends meetings of clubs or  organizations: Not on file    Relationship status: Not on file  Other Topics Concern  . Not on file  Social History Narrative  . Not on file     Family History:  The patient's family history includes Hypertension in his father and mother.   ROS:   Please see the history of present illness.    ROS All other systems reviewed and are negative.   PHYSICAL EXAM:   VS:  BP 112/76   Pulse 77   Ht 5\' 11"  (1.803 m)   Wt 269 lb (122 kg)   SpO2 91%   BMI 37.52 kg/m    GEN: Well nourished, well developed, in no acute distress  HEENT: normal  Neck: no JVD, carotid bruits, or masses Cardiac: RRR; no murmurs, rubs, or gallops,no edema  Respiratory:  clear to auscultation bilaterally, normal work of breathing GI: soft, nontender, nondistended, + BS MS: no deformity or atrophy  Skin: warm and dry, no rash Neuro:  Alert and Oriented x 3, Strength and sensation are intact Psych: euthymic mood, full affect  Wt Readings from Last 3 Encounters:  05/30/18 269 lb (122 kg)  05/17/18 283 lb 3.2 oz (128.5 kg)  10/19/13 256 lb (116.1 kg)      Studies/Labs Reviewed:   EKG:  EKG is not ordered today.    Recent Labs: 05/07/2018: B Natriuretic Peptide 654.7 05/08/2018: ALT 24; TSH 2.202 05/16/2018: BUN 30; Creatinine, Ser 1.31; Hemoglobin 15.3; Platelets 214; Potassium 4.0; Sodium 140   Lipid Panel    Component Value Date/Time   CHOL 103 05/08/2018 0520   TRIG 42 05/08/2018 0520   HDL 37 (L) 05/08/2018 0520   CHOLHDL 2.8 05/08/2018 0520   VLDL 8 05/08/2018 0520   LDLCALC 58 05/08/2018 0520    Additional studies/ records that were reviewed today include:   As above    ASSESSMENT & PLAN:    1. LBBB - No ischemia or infraction on stress test.   2. Chronic diastolic CHF -Euvolemic. Continue BB, ARB and Torsemide. Check BMET.  3. OSA on CPAP - Compliant  4. Restrictive lung disease - Advise to make appointment with pulmonary Dr. Craige Cotta as recommended in hospital  5. HTN - BP  stable on current medications.     Medication Adjustments/Labs and Tests Ordered: Current medicines are reviewed at length with the patient today.  Concerns regarding medicines are outlined above.  Medication changes, Labs and Tests ordered today are listed in the Patient Instructions below. Patient Instructions  Medication Instructions:  Your physician recommends that you continue on your current medications as directed. Please refer to the Current Medication list given to you today.   Labwork: TODAY: BMET  Testing/Procedures: None ordered  Follow-Up: Keep appointment with Dr. Mayford Knife on 08/01/18 at 11:00 AM   Any Other Special Instructions Will  Be Listed Below  Follow-up wth Dr. Craige Cotta. [phone]    If you need a refill on your cardiac medications before your next appointment, please call your pharmacy.      Lorelei Pont, [place]  05/30/2018 10:19 AM    [facility] [address] [place], [place], [place]  [address] Phone: [phone]; Fax: [phone]

## 2018-06-01 ENCOUNTER — Ambulatory Visit: Payer: Medicare Other | Admitting: Physician Assistant

## 2018-06-02 ENCOUNTER — Telehealth: Payer: Self-pay | Admitting: Internal Medicine

## 2018-06-02 ENCOUNTER — Encounter: Payer: Self-pay | Admitting: *Deleted

## 2018-06-02 NOTE — Telephone Encounter (Signed)
I left a message letting the pt know that his 06/20/18 appt has been changed to 06/16/18. VDM (DD)

## 2018-06-16 ENCOUNTER — Ambulatory Visit: Payer: Medicare Other | Admitting: Internal Medicine

## 2018-06-16 ENCOUNTER — Ambulatory Visit: Payer: Medicare Other

## 2018-06-20 ENCOUNTER — Ambulatory Visit: Payer: Medicare Other | Admitting: Nurse Practitioner

## 2018-06-20 ENCOUNTER — Ambulatory Visit (INDEPENDENT_AMBULATORY_CARE_PROVIDER_SITE_OTHER): Payer: Managed Care, Other (non HMO) | Admitting: Nurse Practitioner

## 2018-06-20 ENCOUNTER — Other Ambulatory Visit: Payer: Self-pay

## 2018-06-20 ENCOUNTER — Encounter: Payer: Self-pay | Admitting: Nurse Practitioner

## 2018-06-20 VITALS — BP 118/72 | HR 67 | Temp 98.2°F | Ht 68.0 in | Wt 261.2 lb

## 2018-06-20 DIAGNOSIS — Z125 Encounter for screening for malignant neoplasm of prostate: Secondary | ICD-10-CM

## 2018-06-20 DIAGNOSIS — E1165 Type 2 diabetes mellitus with hyperglycemia: Secondary | ICD-10-CM

## 2018-06-20 DIAGNOSIS — I10 Essential (primary) hypertension: Secondary | ICD-10-CM | POA: Diagnosis not present

## 2018-06-20 DIAGNOSIS — Z1211 Encounter for screening for malignant neoplasm of colon: Secondary | ICD-10-CM

## 2018-06-20 DIAGNOSIS — R351 Nocturia: Secondary | ICD-10-CM

## 2018-06-20 MED ORDER — TORSEMIDE 20 MG PO TABS: 40.0000 mg | ORAL_TABLET | Freq: Every day | ORAL | 1 refills | Status: DC

## 2018-06-20 MED ORDER — LOSARTAN POTASSIUM 25 MG PO TABS: 25.0000 mg | ORAL_TABLET | Freq: Every day | ORAL | 1 refills | Status: DC

## 2018-06-20 MED ORDER — COLCHICINE 0.6 MG PO TABS: 0.6000 mg | ORAL_TABLET | Freq: Every day | ORAL | 1 refills | Status: DC

## 2018-06-20 NOTE — Progress Notes (Signed)
Subjective:     Patient ID: Mark [name] , male    DOB: Sep 23, 1960 , 57 y.o.   MRN: [id number]   He was also admitted to the hospital on 05/07/18-05/17/18, he was diuresed with diuretics.  He lost approximately 20 lbs while in the hospital from diuresis.   He is not eating any meat.  He is trying to eat more healthy.  He was 323 lb and is now 261 lb at discharge.  Diabetes  He presents for his follow-up diabetic visit. He has type 2 diabetes mellitus. He is compliant with treatment some of the time (No longer taking ozempic). His overall blood glucose range is 90-110 mg/dl. An ACE inhibitor/angiotensin II receptor blocker is being taken. Eye exam is not current.     Past Medical History:  Diagnosis Date  . Diabetes mellitus without complication (HCC)   . Hearing impaired   . Hypertension   . Sleep apnea       Current Outpatient Medications:  .  aspirin EC 81 MG EC tablet, Take 1 tablet (81 mg total) by mouth daily., Disp: 30 tablet, Rfl: 0 .  atorvastatin (LIPITOR) 20 MG tablet, Take 20 mg by mouth daily., Disp: , Rfl:  .  carvedilol (COREG) 6.25 MG tablet, Take 6.25 mg by mouth 2 (two) times daily with a meal., Disp: , Rfl:  .  gabapentin (NEURONTIN) 100 MG capsule, Take 1 capsule (100 mg total) by mouth at bedtime., Disp: 90 capsule, Rfl: 0 .  ipratropium-albuterol (DUONEB) 0.5-2.5 (3) MG/3ML SOLN, Take 3 mLs by nebulization every 6 (six) hours as needed., Disp: , Rfl:  .  metFORMIN (GLUCOPHAGE) 500 MG tablet, Take 500 mg by mouth 2 (two) times daily with a meal., Disp: , Rfl:  .  colchicine 0.6 MG tablet, Take 1 tablet (0.6 mg total) by mouth daily., Disp: 5 tablet, Rfl: 1 .  losartan (COZAAR) 25 MG tablet, Take 1 tablet (25 mg total) by mouth daily., Disp: 90 tablet, Rfl: 1 .  torsemide (DEMADEX) 20 MG tablet, Take 2 tablets (40 mg total) by mouth daily., Disp: 90 tablet, Rfl: 1   No Known Allergies   Review of Systems  Constitutional: Negative.   HENT: Negative.    Respiratory: Negative.   Cardiovascular: Negative.   Musculoskeletal: Negative.   Skin:       Right lower extremity with healing wound with scabbing.    Neurological: Negative.   Hematological: Negative.      Today's Vitals   06/20/18 1027  BP: 118/72  Pulse: 67  Temp: 98.2 F (36.8 C)  TempSrc: Oral  SpO2: 91%  Weight: 261 lb 3.2 oz (118.5 kg)  Height: 5\' 8"  (1.727 m)  PainSc: 0-No pain   Body mass index is 39.72 kg/m.   Objective:  Physical Exam  Constitutional: He is oriented to person, place, and time. He appears well-developed and well-nourished.  Cardiovascular: Normal rate, regular rhythm and normal heart sounds.  Pulmonary/Chest: Effort normal and breath sounds normal.  Neurological: He is alert and oriented to person, place, and time.  Skin: Skin is warm and dry.        Assessment And Plan:     1. Type 2 diabetes mellitus with hyperglycemia, without long-term current use of insulin (HCC)  Chronic, improving  Continue with current medications - CBC - CMP14 + Anion Gap - Hemoglobin A1c - Hepatitis C antibody - Lipid Profile  2. Encounter for prostate cancer screening  - PSA  3. Essential hypertension  Chronic, controlled  Continue with current medications 4. Encounter for screening for malignant neoplasm of colon   5. Nocturia  Has been having episodes of nocturia  Will check PSA - PSA      [name], FNP

## 2018-06-21 LAB — CMP14 + ANION GAP
ALBUMIN: 4.3 g/dL (ref 3.5–5.5)
ALK PHOS: 174 IU/L — AB (ref 39–117)
ALT: 42 IU/L (ref 0–44)
ANION GAP: 15 mmol/L (ref 10.0–18.0)
AST: 32 IU/L (ref 0–40)
Albumin/Globulin Ratio: 1.6 (ref 1.2–2.2)
BILIRUBIN TOTAL: 0.3 mg/dL (ref 0.0–1.2)
BUN / CREAT RATIO: 19 (ref 9–20)
BUN: 22 mg/dL (ref 6–24)
CO2: 23 mmol/L (ref 20–29)
CREATININE: 1.15 mg/dL (ref 0.76–1.27)
Calcium: 9.8 mg/dL (ref 8.7–10.2)
Chloride: 104 mmol/L (ref 96–106)
GFR calc Af Amer: 80 mL/min/{1.73_m2} (ref >59)
GFR calc non Af Amer: 69 mL/min/{1.73_m2} (ref >59)
GLUCOSE: 73 mg/dL (ref 65–99)
Globulin, Total: 2.7 g/dL (ref 1.5–4.5)
Potassium: 4 mmol/L (ref 3.5–5.2)
Sodium: 142 mmol/L (ref 134–144)
TOTAL PROTEIN: 7 g/dL (ref 6.0–8.5)

## 2018-06-21 LAB — CBC
HEMOGLOBIN: 15.6 g/dL (ref 13.0–17.7)
Hematocrit: 47.7 % (ref 37.5–51.0)
MCH: 28.3 pg (ref 26.6–33.0)
MCHC: 32.7 g/dL (ref 31.5–35.7)
MCV: 86 fL (ref 79–97)
Platelets: 204 10*3/uL (ref 150–450)
RBC: 5.52 x10E6/uL (ref 4.14–5.80)
RDW: 15.2 % (ref 12.3–15.4)
WBC: 4.6 10*3/uL (ref 3.4–10.8)

## 2018-06-21 LAB — HEPATITIS C ANTIBODY

## 2018-06-21 LAB — LIPID PANEL
Chol/HDL Ratio: 3 ratio (ref 0.0–5.0)
Cholesterol, Total: 152 mg/dL (ref 100–199)
HDL: 50 mg/dL (ref >39)
LDL Calculated: 83 mg/dL (ref 0–99)
TRIGLYCERIDES: 94 mg/dL (ref 0–149)
VLDL Cholesterol Cal: 19 mg/dL (ref 5–40)

## 2018-06-21 LAB — PSA: Prostate Specific Ag, Serum: 1.2 ng/mL (ref 0.0–4.0)

## 2018-06-21 LAB — HEMOGLOBIN A1C
Est. average glucose Bld gHb Est-mCnc: 146 mg/dL
HEMOGLOBIN A1C: 6.7 % — AB (ref 4.8–5.6)

## 2018-06-22 NOTE — Telephone Encounter (Signed)
These are all okay to refill.

## 2018-07-11 ENCOUNTER — Encounter: Payer: Self-pay | Admitting: Cardiology

## 2018-07-16 ENCOUNTER — Other Ambulatory Visit: Payer: Self-pay | Admitting: Podiatry

## 2018-07-22 ENCOUNTER — Telehealth: Payer: Self-pay

## 2018-07-22 NOTE — Telephone Encounter (Signed)
Patient called stating he received a letter regarding his labs and he was calling to discuss it.  Returned pt call and discussed his labs with him. YRL,RMA

## 2018-08-01 ENCOUNTER — Encounter: Payer: Self-pay | Admitting: Pulmonary Disease

## 2018-08-01 ENCOUNTER — Ambulatory Visit (INDEPENDENT_AMBULATORY_CARE_PROVIDER_SITE_OTHER): Payer: 59 | Admitting: Pulmonary Disease

## 2018-08-01 ENCOUNTER — Ambulatory Visit (INDEPENDENT_AMBULATORY_CARE_PROVIDER_SITE_OTHER): Payer: 59 | Admitting: Cardiology

## 2018-08-01 ENCOUNTER — Encounter: Payer: Self-pay | Admitting: Cardiology

## 2018-08-01 VITALS — BP 134/78 | HR 74 | Ht 68.5 in | Wt 257.4 lb

## 2018-08-01 VITALS — BP 129/82 | HR 59 | Ht 68.0 in | Wt 258.8 lb

## 2018-08-01 DIAGNOSIS — I5032 Chronic diastolic (congestive) heart failure: Secondary | ICD-10-CM

## 2018-08-01 DIAGNOSIS — I10 Essential (primary) hypertension: Secondary | ICD-10-CM

## 2018-08-01 DIAGNOSIS — N183 Chronic kidney disease, stage 3 unspecified: Secondary | ICD-10-CM

## 2018-08-01 DIAGNOSIS — Z6841 Body Mass Index (BMI) 40.0 and over, adult: Secondary | ICD-10-CM

## 2018-08-01 DIAGNOSIS — G4733 Obstructive sleep apnea (adult) (pediatric): Secondary | ICD-10-CM

## 2018-08-01 DIAGNOSIS — G4730 Sleep apnea, unspecified: Secondary | ICD-10-CM

## 2018-08-01 NOTE — Patient Instructions (Signed)
Follow up with pulmonary as needed 

## 2018-08-01 NOTE — Progress Notes (Signed)
[facility]  Chief Complaint  Patient presents with  . Hospitalization Follow-up    admission 05/07/18. pt states breathinng is doing well since. no current sx. occ wears cpap with 2L O2 bled in. pt owns cpap machine    Constitutional:  BP 134/78 (BP Location: Left Arm, Cuff Size: Normal)   Pulse 74   Ht 5' 8.5" (1.74 m)   Wt 257 lb 6.4 oz (116.8 kg)   SpO2 93%   BMI 38.57 kg/m   Past Medical History:  Diastolic CHF, DM, HTN  Brief Summary:  Mark [name] is a 57 y.o. male with sleep disordered breathing.  He was in hospital in September for diastolic CHF.  He is followed by Dr. Mayford Knife with cardiology for this.  She also manages his sleep disordered breathing.  It is reported that he has COPD, but his PFT from September was more consistent with restrictive defect and diffusion defect, likely from obesity and heart failure.  He wasn't clear why he needed this appointment today.  He is not having any trouble with his breathing at present.   Assessment/Plan:   Sleep disordered breathing. - he plans to continue following Dr. Mayford Knife for this since she will also be managing his diastolic CHF  Possible COPD. - he PFT was more suggestive of restrictive defect and diffusion defect likely from obesity and CHF - he doesn't feel he needs any additional pulmonary follow up at this time since he is not having significant respiratory symptoms   Patient Instructions  Follow up with pulmonary as needed  Coralyn Helling, MD [facility] Pager: [phone] 08/01/2018, 2:51 PM  Flow Sheet     Pulmonary tests:  ABG 05/10/18 >> pH 7.39, PCO2 61.9, POS 59.7 PFT 05/11/18 >> FEV1 1.84 (56%), FEV1% 81, TLC 5.03 (69%), DLCO 57%  Cardiac tests:  Echo 05/09/18 >> EF 50 to 55%, grade 1 DD, mild AS, mild MR, PAS 45 mmHg  Medications:   Allergies as of 08/01/2018   No Known Allergies     Medication List        Accurate as of  08/01/18  2:51 PM. Always use your most recent med list.          aspirin 81 MG EC tablet Take 1 tablet (81 mg total) by mouth daily.   atorvastatin 20 MG tablet Commonly known as:  LIPITOR Take 20 mg by mouth daily.   carvedilol 6.25 MG tablet Commonly known as:  COREG Take 6.25 mg by mouth 2 (two) times daily with a meal.   colchicine 0.6 MG tablet Take 1 tablet (0.6 mg total) by mouth daily.   gabapentin 100 MG capsule Commonly known as:  NEURONTIN TAKE 1 CAPSULE (100 MG TOTAL) BY MOUTH AT BEDTIME.   ipratropium-albuterol 0.5-2.5 (3) MG/3ML Soln Commonly known as:  DUONEB Take 3 mLs by nebulization every 6 (six) hours as needed.   losartan 25 MG tablet Commonly known as:  COZAAR Take 1 tablet (25 mg total) by mouth daily.   metFORMIN 500 MG tablet Commonly known as:  GLUCOPHAGE Take 500 mg by mouth 2 (two) times daily with a meal.   torsemide 20 MG tablet Commonly known as:  DEMADEX Take 2 tablets (40 mg total) by mouth daily.       Past Surgical History:  He  has a past surgical history that includes Back surgery and Tracheostomy.  Family History:  His family history includes Hypertension in his father and  mother.  Social History:  He  reports that he quit smoking about 20 years ago. His smoking use included cigarettes. He quit after 1.00 year of use. He has never used smokeless tobacco. He reports that he does not drink alcohol or use drugs.

## 2018-08-01 NOTE — Patient Instructions (Signed)
Medication Instructions:  Your physician recommends that you continue on your current medications as directed. Please refer to the Current Medication list given to you today.  If you need a refill on your cardiac medications before your next appointment, please call your pharmacy.   Lab work:  If you have labs (blood work) drawn today and your tests are completely normal, you will receive your results only by: Marland Kitchen. MyChart Message (if you have MyChart) OR . A paper copy in the mail If you have any lab test that is abnormal or we need to change your treatment, we will call you to review the results.  Testing/Procedures: Overnight Pulse Oximetry   Follow-Up: At [facility], you and your health needs are our priority.  As part of our continuing mission to provide you with exceptional heart care, we have created designated Provider Care Teams.  These Care Teams include your primary Cardiologist (physician) and Advanced Practice Providers (APPs -  Physician Assistants and Nurse Practitioners) who all work together to provide you with the care you need, when you need it.  Your physician wants you to follow-up in: 6 months with PA.  You will receive a reminder letter in the mail two months in advance. If you don't receive a letter, please call our office to schedule the follow-up appointment.  You will need a follow up appointment in 1 years.  Please call our office 2 months in advance to schedule this appointment.  You may see Armanda Magicraci Turner, MD or one of the following Advanced Practice Providers on your designated Care Team:   RhodesBrittainy Simmons, PA-C Ronie Spiesayna Dunn, PA-C . Jacolyn ReedyMichele Lenze, PA-C

## 2018-08-01 NOTE — Progress Notes (Signed)
Cardiology Office Note:    Date:  08/01/2018   ID:  Mark [name], DOB 05/30/1961, MRN [id number]  PCP:  Dorothyann PengSanders, Robyn, MD  Cardiologist:  Armanda Magicraci Cheyna Retana, MD    Referring MD: Dorothyann PengSanders, Robyn, MD   Chief Complaint  Patient presents with  . Sleep Apnea  . Hypertension  . Congestive Heart Failure    History of Present Illness:    Mark [name] is a 57 y.o. male with a hx of type 2 diabetes mellitus, hypertension,morbid obesity, hyperlipidemia, medical noncompliance and obstructive sleep apnea.  He was hospitalized back in September 2019 for acute diastolic CHF exacerbation.  Echo at that time showed an EF of 50 to 55% with mild to moderate LVH and very mild aortic stenosis, mild MR and moderate right ventricular enlargement.  EKG showed left bundle branch block.  Nuclear stress test showed no ischemia.  He was discharged on Demadex.  He continues to use his CPAP as well as oxygen at night.  He is followed by pulmonary for moderate to severe obstructive COPD and restrictive lung disease.  He is here today for followup and is doing well.  He denies any chest pain or pressure, SOB, DOE, PND, orthopnea, LE edema, dizziness, palpitations or syncope. He is compliant with his meds and is tolerating meds with no SE.  He is doing well with his CPAP device and thinks that he has gotten used to it.  He tolerates the mask and feels the pressure is adequate.  Since going on CPAP he feels rested in the am and has no significant daytime sleepiness.  He denies any significant mouth or nasal dryness or nasal congestion.  He does not think that he snores.     Past Medical History:  Diagnosis Date  . Chronic diastolic heart failure (HCC) 05/07/2018  . Diabetes mellitus without complication (HCC)   . Hearing impaired   . Hypertension   . Sleep apnea     Past Surgical History:  Procedure Laterality Date  . BACK SURGERY    . TRACHEOSTOMY      Current Medications: Current Meds  Medication Sig  .  aspirin EC 81 MG EC tablet Take 1 tablet (81 mg total) by mouth daily.  Marland Kitchen. atorvastatin (LIPITOR) 20 MG tablet Take 20 mg by mouth daily.  . carvedilol (COREG) 6.25 MG tablet Take 6.25 mg by mouth 2 (two) times daily with a meal.  . colchicine 0.6 MG tablet Take 1 tablet (0.6 mg total) by mouth daily.  Marland Kitchen. gabapentin (NEURONTIN) 100 MG capsule TAKE 1 CAPSULE (100 MG TOTAL) BY MOUTH AT BEDTIME.  Marland Kitchen. ipratropium-albuterol (DUONEB) 0.5-2.5 (3) MG/3ML SOLN Take 3 mLs by nebulization every 6 (six) hours as needed.  Marland Kitchen. losartan (COZAAR) 25 MG tablet Take 1 tablet (25 mg total) by mouth daily.  . metFORMIN (GLUCOPHAGE) 500 MG tablet Take 500 mg by mouth 2 (two) times daily with a meal.  . torsemide (DEMADEX) 20 MG tablet Take 2 tablets (40 mg total) by mouth daily.     Allergies:   Patient has no known allergies.   Social History   Socioeconomic History  . Marital status: Married    Spouse name: Not on file  . Number of children: Not on file  . Years of education: Not on file  . Highest education level: Not on file  Occupational History  . Not on file  Social Needs  . Financial resource strain: Not on file  . Food insecurity:  Worry: Not on file    Inability: Not on file  . Transportation needs:    Medical: Not on file    Non-medical: Not on file  Tobacco Use  . Smoking status: Never Smoker  . Smokeless tobacco: Never Used  Substance and Sexual Activity  . Alcohol use: No  . Drug use: No  . Sexual activity: Not on file  Lifestyle  . Physical activity:    Days per week: Not on file    Minutes per session: Not on file  . Stress: Not on file  Relationships  . Social connections:    Talks on phone: Not on file    Gets together: Not on file    Attends religious service: Not on file    Active member of club or organization: Not on file    Attends meetings of clubs or organizations: Not on file    Relationship status: Not on file  Other Topics Concern  . Not on file  Social History  Narrative  . Not on file     Family History: The patient's family history includes Hypertension in his father and mother.  ROS:   Please see the history of present illness.    ROS  All other systems reviewed and negative.   EKGs/Labs/Other Studies Reviewed:    The following studies were reviewed today: Hospital notes and echo  EKG:  EKG is not ordered today.    Recent Labs: 05/07/2018: B Natriuretic Peptide 654.7 05/08/2018: TSH 2.202 06/20/2018: ALT 42; BUN 22; Creatinine, Ser 1.15; Hemoglobin 15.6; Platelets 204; Potassium 4.0; Sodium 142   Recent Lipid Panel    Component Value Date/Time   CHOL 152 06/20/2018 1105   TRIG 94 06/20/2018 1105   HDL 50 06/20/2018 1105   CHOLHDL 3.0 06/20/2018 1105   CHOLHDL 2.8 05/08/2018 0520   VLDL 8 05/08/2018 0520   LDLCALC 83 06/20/2018 1105    Physical Exam:    VS:  BP 129/82   Pulse (!) 59   Ht 5\' 8"  (1.727 m)   Wt 258 lb 12.8 oz (117.4 kg)   SpO2 99%   BMI 39.35 kg/m     Wt Readings from Last 3 Encounters:  08/01/18 258 lb 12.8 oz (117.4 kg)  06/20/18 261 lb 3.2 oz (118.5 kg)  05/30/18 269 lb (122 kg)     GEN:  Well nourished, well developed in no acute distress HEENT: Normal NECK: No JVD; No carotid bruits LYMPHATICS: No lymphadenopathy CARDIAC: RRR, no murmurs, rubs, gallops RESPIRATORY:  Clear to auscultation without rales, wheezing or rhonchi  ABDOMEN: Soft, non-tender, non-distended   1. Chronic diastolic heart failure (HCC)   2. Benign essential hypertension   3. Body mass index (BMI) of 40.0-44.9 in adult (HCC)   4. CKD (chronic kidney disease), stage III (HCC)    PLAN:    In order of problems listed above:  1.  Chronic diastolic CHF -he appears euvolemic on exam today.  He will continue on torsemide 40 mg daily.  2.  HTN -BP is controlled on exam today.  He will continue on losartan 25 mg daily and carvedilol 6.25 mg twice daily.  His creatinine was stable at 1.15 on 06/20/2018.  3.  Morbid obesity -  I have encouraged him to get into a routine exercise program and cut back on carbs and portions.   4.  CKD stage III -this is followed by his PCP.  5.  OSA - I will get a copy of his  last download on his CPAP from his DME.  We will also get an overnight pulse oximetry on CPAP and oxygen to make sure he does not have any residual nocturnal hypoxemia   Medication Adjustments/Labs and Tests Ordered: Current medicines are reviewed at length with the patient today.  Concerns regarding medicines are outlined above.  No orders of the defined types were placed in this encounter.  No orders of the defined types were placed in this encounter.   Signed, Armanda Magic, MD  08/01/2018 11:04 AM    [facility]

## 2018-08-04 ENCOUNTER — Telehealth: Payer: Self-pay | Admitting: *Deleted

## 2018-08-04 NOTE — Telephone Encounter (Signed)
-----   Message from Dustin FlockBen G Kordsmeier, RN sent at 08/01/2018  5:10 PM EST ----- Regarding: Sleep Hello, Dr. Mayford Knifeurner ordered an overnight pulse oximetry and a download on CPAP. Please download and send. Thanks, [employer]

## 2018-08-04 NOTE — Telephone Encounter (Signed)
Orders faxed to [facility] at [phone].

## 2018-09-09 ENCOUNTER — Other Ambulatory Visit: Payer: Self-pay | Admitting: Nurse Practitioner

## 2018-09-26 ENCOUNTER — Ambulatory Visit (INDEPENDENT_AMBULATORY_CARE_PROVIDER_SITE_OTHER): Payer: 59 | Admitting: Nurse Practitioner

## 2018-09-26 ENCOUNTER — Encounter: Payer: Self-pay | Admitting: Nurse Practitioner

## 2018-09-26 VITALS — BP 126/80 | HR 65 | Temp 98.5°F | Ht 69.2 in | Wt 255.0 lb

## 2018-09-26 DIAGNOSIS — Z7982 Long term (current) use of aspirin: Secondary | ICD-10-CM | POA: Diagnosis not present

## 2018-09-26 DIAGNOSIS — E1165 Type 2 diabetes mellitus with hyperglycemia: Secondary | ICD-10-CM | POA: Diagnosis not present

## 2018-09-26 DIAGNOSIS — I10 Essential (primary) hypertension: Secondary | ICD-10-CM | POA: Diagnosis not present

## 2018-09-26 DIAGNOSIS — E119 Type 2 diabetes mellitus without complications: Secondary | ICD-10-CM | POA: Insufficient documentation

## 2018-09-26 DIAGNOSIS — I5032 Chronic diastolic (congestive) heart failure: Secondary | ICD-10-CM | POA: Diagnosis not present

## 2018-09-26 DIAGNOSIS — I110 Hypertensive heart disease with heart failure: Secondary | ICD-10-CM

## 2018-09-26 DIAGNOSIS — E1122 Type 2 diabetes mellitus with diabetic chronic kidney disease: Secondary | ICD-10-CM | POA: Insufficient documentation

## 2018-09-26 DIAGNOSIS — E669 Obesity, unspecified: Secondary | ICD-10-CM | POA: Insufficient documentation

## 2018-09-26 DIAGNOSIS — Z6837 Body mass index (BMI) 37.0-37.9, adult: Secondary | ICD-10-CM

## 2018-09-26 NOTE — Progress Notes (Signed)
Subjective:     Patient ID: Mark [name] , male    DOB: 04/12/1961 , 58 y.o.   MRN: [id number]   Chief Complaint  Patient presents with  . Diabetes    HPI  Diabetes  He presents for his follow-up diabetic visit. He has type 2 diabetes mellitus. His disease course has been improving. There are no hypoglycemic associated symptoms. There are no diabetic associated symptoms. There are no hypoglycemic complications. Symptoms are improving. There are no diabetic complications. Risk factors for coronary artery disease include diabetes mellitus, hypertension, male sex, obesity and sedentary lifestyle. Current diabetic treatment includes oral agent (monotherapy). He is compliant with treatment most of the time. His weight is decreasing steadily. Diabetic current diet: not eating meat  When asked about meal planning, he reported none. He has had a previous visit with a dietitian. There is no change in his home blood glucose trend. An ACE inhibitor/angiotensin II receptor blocker is being taken. Eye exam is not current.  Hypertension  This is a chronic problem. The current episode started more than 1 year ago. The problem is controlled. There are no associated agents to hypertension. Risk factors for coronary artery disease include diabetes mellitus, male gender, obesity and sedentary lifestyle. Past treatments include angiotensin blockers. Compliance problems include exercise.  There is no history of chronic renal disease.     Past Medical History:  Diagnosis Date  . Chronic diastolic heart failure (HCC) 05/07/2018  . Diabetes mellitus without complication (HCC)   . Hearing impaired   . Hypertension   . Sleep apnea      Family History  Problem Relation Age of Onset  . Hypertension Mother   . Hypertension Father      Current Outpatient Medications:  .  aspirin EC 81 MG EC tablet, Take 1 tablet (81 mg total) by mouth daily., Disp: 30 tablet, Rfl: 0 .  atorvastatin (LIPITOR) 20 MG tablet,  Take 20 mg by mouth daily., Disp: , Rfl:  .  carvedilol (COREG) 6.25 MG tablet, TAKE 1 TABLET BY MOUTH 2 TIMES EVERY DAY WITH FOOD, Disp: 180 tablet, Rfl: 1 .  colchicine 0.6 MG tablet, Take 1 tablet (0.6 mg total) by mouth daily., Disp: 5 tablet, Rfl: 1 .  gabapentin (NEURONTIN) 100 MG capsule, TAKE 1 CAPSULE (100 MG TOTAL) BY MOUTH AT BEDTIME., Disp: 90 capsule, Rfl: 2 .  ipratropium-albuterol (DUONEB) 0.5-2.5 (3) MG/3ML SOLN, Take 3 mLs by nebulization every 6 (six) hours as needed., Disp: , Rfl:  .  losartan (COZAAR) 25 MG tablet, Take 1 tablet (25 mg total) by mouth daily., Disp: 90 tablet, Rfl: 1 .  metFORMIN (GLUCOPHAGE) 500 MG tablet, Take 500 mg by mouth 2 (two) times daily with a meal., Disp: , Rfl:  .  torsemide (DEMADEX) 20 MG tablet, TAKE 2 TABLETS BY MOUTH EVERY DAY, Disp: 180 tablet, Rfl: 0   No Known Allergies   Review of Systems  Constitutional: Negative for fever.  Respiratory: Negative.   Cardiovascular: Negative.   Gastrointestinal: Negative.   Genitourinary: Negative.   Musculoskeletal: Negative.   Skin: Negative.   Neurological: Negative.      Today's Vitals   09/26/18 0920  BP: 126/80  Pulse: 65  Temp: 98.5 F (36.9 C)  TempSrc: Oral  SpO2: 92%  Weight: 255 lb (115.7 kg)  Height: 5' 9.2" (1.758 m)  PainSc: 0-No pain   Body mass index is 37.44 kg/m.   Objective:  Physical Exam Vitals signs reviewed.  Constitutional:  General: He is not in acute distress.    Appearance: Normal appearance. He is obese.  Cardiovascular:     Rate and Rhythm: Normal rate and regular rhythm.     Pulses: Normal pulses.     Heart sounds: Normal heart sounds. No murmur.  Pulmonary:     Effort: Pulmonary effort is normal. No respiratory distress.     Breath sounds: Normal breath sounds.  Musculoskeletal: Normal range of motion.  Skin:    General: Skin is warm and dry.     Capillary Refill: Capillary refill takes less than 2 seconds.  Neurological:     General: No  focal deficit present.     Mental Status: He is alert and oriented to person, place, and time.  Psychiatric:        Mood and Affect: Mood normal.         Assessment And Plan:   1. Type 2 diabetes mellitus with hyperglycemia, without long-term current use of insulin (HCC)  Chronic, controlled  Continue with current medications  Encouraged to limit intake of sugary foods and drinks  Encouraged to increase physical activity to 150 minutes per week  I have explained to him with continued weight loss this helps his HgbA1c to decrease  2. Essential hypertension . B/P is much better controlled.  . CMP ordered to check renal function.  . The importance of regular exercise and dietary modification was stressed to the patient.  . Stressed importance of losing ten percent of her body weight to help with B/P control.  . The weight loss would help with decreasing cardiac and cancer risk as well.   3. Chronic diastolic heart failure (HCC) Chronic, controlled Continue with current medications Continue with follow up with Cardiology  4. Morbid obesity (HCC)  Chronic  Discussed healthy diet and regular exercise options   Encouraged to exercise at least 150 minutes per week with 2 days of strength training  He has done extremely well with his weight loss, no longer eating meat.    He has lost 6lbs since his last visit.  5. BMI 37.0-37.9, adult  See # 4.       [name], FNP

## 2018-09-27 LAB — LIPID PANEL
CHOLESTEROL TOTAL: 185 mg/dL (ref 100–199)
Chol/HDL Ratio: 3.6 ratio (ref 0.0–5.0)
HDL: 51 mg/dL (ref >39)
LDL Calculated: 103 mg/dL — ABNORMAL HIGH (ref 0–99)
Triglycerides: 153 mg/dL — ABNORMAL HIGH (ref 0–149)
VLDL CHOLESTEROL CAL: 31 mg/dL (ref 5–40)

## 2018-09-27 LAB — CMP14 + ANION GAP
A/G RATIO: 1.4 (ref 1.2–2.2)
ALT: 21 IU/L (ref 0–44)
AST: 16 IU/L (ref 0–40)
Albumin: 4.2 g/dL (ref 3.8–4.9)
Alkaline Phosphatase: 135 IU/L — ABNORMAL HIGH (ref 39–117)
Anion Gap: 15 mmol/L (ref 10.0–18.0)
BUN/Creatinine Ratio: 14 (ref 9–20)
BUN: 17 mg/dL (ref 6–24)
Bilirubin Total: 0.8 mg/dL (ref 0.0–1.2)
CALCIUM: 9.6 mg/dL (ref 8.7–10.2)
CHLORIDE: 102 mmol/L (ref 96–106)
CO2: 26 mmol/L (ref 20–29)
Creatinine, Ser: 1.24 mg/dL (ref 0.76–1.27)
GFR calc Af Amer: 73 mL/min/{1.73_m2} (ref >59)
GFR, EST NON AFRICAN AMERICAN: 63 mL/min/{1.73_m2} (ref >59)
GLUCOSE: 106 mg/dL — AB (ref 65–99)
Globulin, Total: 2.9 g/dL (ref 1.5–4.5)
POTASSIUM: 3.9 mmol/L (ref 3.5–5.2)
Sodium: 143 mmol/L (ref 134–144)
Total Protein: 7.1 g/dL (ref 6.0–8.5)

## 2018-09-27 LAB — HEMOGLOBIN A1C
ESTIMATED AVERAGE GLUCOSE: 111 mg/dL
Hgb A1c MFr Bld: 5.5 % (ref 4.8–5.6)

## 2018-10-04 ENCOUNTER — Other Ambulatory Visit: Payer: Self-pay | Admitting: Nurse Practitioner

## 2018-10-18 ENCOUNTER — Other Ambulatory Visit: Payer: Self-pay | Admitting: Nurse Practitioner

## 2018-11-21 ENCOUNTER — Ambulatory Visit: Payer: Medicare Other | Admitting: Pulmonary Disease

## 2018-11-24 ENCOUNTER — Other Ambulatory Visit: Payer: Self-pay | Admitting: Nurse Practitioner

## 2018-12-03 ENCOUNTER — Other Ambulatory Visit: Payer: Self-pay | Admitting: Nurse Practitioner

## 2018-12-09 ENCOUNTER — Other Ambulatory Visit: Payer: Self-pay | Admitting: Nurse Practitioner

## 2018-12-26 ENCOUNTER — Other Ambulatory Visit: Payer: Self-pay | Admitting: Nurse Practitioner

## 2018-12-26 ENCOUNTER — Ambulatory Visit: Payer: Medicare Other | Admitting: Pulmonary Disease

## 2018-12-27 ENCOUNTER — Ambulatory Visit (INDEPENDENT_AMBULATORY_CARE_PROVIDER_SITE_OTHER): Payer: 59

## 2018-12-27 ENCOUNTER — Other Ambulatory Visit: Payer: Self-pay

## 2018-12-27 VITALS — Ht 71.5 in | Wt 250.0 lb

## 2018-12-27 DIAGNOSIS — Z0000 Encounter for general adult medical examination without abnormal findings: Secondary | ICD-10-CM | POA: Diagnosis not present

## 2018-12-27 NOTE — Progress Notes (Signed)
Subjective:   Mark [name] is a 58 y.o. male who presents for an Initial Medicare Annual Wellness Visit.  This visit type was conducted due to national recommendations for restrictions regarding the COVID-19 Pandemic (e.g. social distancing). This format is felt to be most appropriate for this patient at this time. All issues noted in this document were discussed and addressed. No physical exam was performed (except for noted visual exam findings with Video Visits). The patient, [name], has given consent to perform this visit via video. Vital signs may be absent or patient reported.   Patient location: at home    Nurse location:  [facility] office   Review of Systems  n/a Cardiac Risk Factors include: advanced age (>41men, >63 women);male gender;obesity (BMI >30kg/m2);diabetes mellitus;dyslipidemia;hypertension    Objective:    Today's Vitals   12/27/18 1419  Weight: 250 lb (113.4 kg)  Height: 5' 11.5" (1.816 m)  PainSc: 0-No pain   Body mass index is 34.38 kg/m.  Advanced Directives 12/27/2018 05/07/2018  Does Patient Have a Medical Advance Directive? Yes No  Type of Advance Directive Living will -  Would patient like information on creating a medical advance directive? - No - Patient declined    Current Medications (verified) Outpatient Encounter Medications as of 12/27/2018  Medication Sig  . aspirin EC 81 MG EC tablet Take 1 tablet (81 mg total) by mouth daily.  Marland Kitchen atorvastatin (LIPITOR) 20 MG tablet TAKE 1 TABLET BY MOUTH EVERY DAY  . carvedilol (COREG) 6.25 MG tablet TAKE 1 TABLET BY MOUTH 2 TIMES EVERY DAY WITH FOOD  . colchicine 0.6 MG tablet Take 1 tablet (0.6 mg total) by mouth daily.  Marland Kitchen gabapentin (NEURONTIN) 100 MG capsule TAKE 1 CAPSULE (100 MG TOTAL) BY MOUTH AT BEDTIME.  Marland Kitchen losartan (COZAAR) 25 MG tablet TAKE 1 TABLET BY MOUTH EVERY DAY  . metFORMIN (GLUCOPHAGE) 500 MG tablet TAKE 1 TABLET BY MOUTH TWICE A DAY WITH MORNING AND EVENING MEALS  .  torsemide (DEMADEX) 20 MG tablet TAKE 2 TABLETS BY MOUTH EVERY DAY  . ipratropium-albuterol (DUONEB) 0.5-2.5 (3) MG/3ML SOLN Take 3 mLs by nebulization every 6 (six) hours as needed.   No facility-administered encounter medications on file as of 12/27/2018.     Allergies (verified) Patient has no known allergies.   History: Past Medical History:  Diagnosis Date  . Chronic diastolic heart failure (HCC) 05/07/2018  . Diabetes mellitus without complication (HCC)   . Hearing impaired   . Hypertension   . Sleep apnea    Past Surgical History:  Procedure Laterality Date  . BACK SURGERY    . TRACHEOSTOMY     Family History  Problem Relation Age of Onset  . Hypertension Mother   . Hypertension Father    Social History   Socioeconomic History  . Marital status: Married    Spouse name: Not on file  . Number of children: Not on file  . Years of education: Not on file  . Highest education level: Not on file  Occupational History  . Occupation: employed  Engineer, production  . Financial resource strain: Not hard at all  . Food insecurity:    Worry: Never true    Inability: Never true  . Transportation needs:    Medical: No    Non-medical: No  Tobacco Use  . Smoking status: Former Smoker    Years: 1.00    Types: Cigarettes    Last attempt to quit: 1999    Years  since quitting: 21.3  . Smokeless tobacco: Never Used  . Tobacco comment: 1 pack would last one month--08/01/18  Substance and Sexual Activity  . Alcohol use: No  . Drug use: No  . Sexual activity: Yes  Lifestyle  . Physical activity:    Days per week: 7 days    Minutes per session: 30 min  . Stress: Not at all  Relationships  . Social connections:    Talks on phone: Not on file    Gets together: Not on file    Attends religious service: Not on file    Active member of club or organization: Not on file    Attends meetings of clubs or organizations: Not on file    Relationship status: Not on file  Other Topics  Concern  . Not on file  Social History Narrative  . Not on file   Tobacco Counseling Counseling given: Not Answered Comment: 1 pack would last one month--08/01/18   Clinical Intake:  Pre-visit preparation completed: Yes  Pain : No/denies pain Pain Score: 0-No pain     Nutritional Status: BMI > 30  Obese Nutritional Risks: None Diabetes: Yes CBG done?: No Did pt. bring in CBG monitor from home?: No  How often do you need to have someone help you when you read instructions, pamphlets, or other written materials from your doctor or pharmacy?: 1 - Never What is the last grade level you completed in school?: 12th  grade  Interpreter Needed?: No  Information entered by :: NAllen LPN  Activities of Daily Living In your present state of health, do you have any difficulty performing the following activities: 12/27/2018 06/20/2018  Hearing? Y Y  Comment wears hearing aide -  Vision? N N  Difficulty concentrating or making decisions? N N  Walking or climbing stairs? N N  Dressing or bathing? N N  Doing errands, shopping? N N  Preparing Food and eating ? N -  Using the Toilet? N -  In the past six months, have you accidently leaked urine? N -  Do you have problems with loss of bowel control? N -  Managing your Medications? N -  Managing your Finances? N -  Housekeeping or managing your Housekeeping? N -  Some recent data might be hidden     Immunizations and Health Maintenance  There is no immunization history on file for this patient. Health Maintenance Due  Topic Date Due  . FOOT EXAM  10/09/1968  . OPHTHALMOLOGY EXAM  10/09/1968  . COLONOSCOPY  10/09/2008    Patient Care Team: Dorothyann PengSanders, Robyn, MD as PCP - General (Internal Medicine) Quintella Reicherturner, Traci R, MD as PCP - Cardiology (Cardiology)  Indicate any recent Medical Services you may have received from other than [facility] providers in the past year (date may be approximate).    Assessment:   This is a routine wellness  examination for Mark [name].  Hearing/Vision screen Vision Screening Comments: No annual eye exams  Dietary issues and exercise activities discussed: Current Exercise Habits: Home exercise routine, Type of exercise: walking;Other - see comments(bike riding), Time (Minutes): 30, Frequency (Times/Week): 7, Weekly Exercise (Minutes/Week): 210, Intensity: Moderate  Goals    . Patient Stated     No goals      Depression Screen PHQ 2/9 Scores 12/27/2018 09/26/2018 06/20/2018  PHQ - 2 Score 0 0 0  PHQ- 9 Score 0 - -    Fall Risk Fall Risk  12/27/2018 09/26/2018 06/20/2018  Falls in the past year? 0  0 No  Follow up Falls prevention discussed - -    Is the patient's home free of loose throw rugs in walkways, pet beds, electrical cords, etc?   yes      Grab bars in the bathroom? no      Handrails on the stairs?   n/a      Adequate lighting?   yes  Timed Get Up and Go performed: n/a  Cognitive Function:     6CIT Screen 12/27/2018  What Year? 0 points  What month? 0 points  What time? 0 points  Count back from 20 0 points  Months in reverse 4 points  Repeat phrase 2 points  Total Score 6    Screening Tests Health Maintenance  Topic Date Due  . FOOT EXAM  10/09/1968  . OPHTHALMOLOGY EXAM  10/09/1968  . COLONOSCOPY  10/09/2008  . PNEUMOCOCCAL POLYSACCHARIDE VACCINE AGE 50-64 HIGH RISK  12/27/2019 (Originally 10/09/1962)  . HEMOGLOBIN A1C  03/27/2019  . INFLUENZA VACCINE  04/01/2019  . TETANUS/TDAP  10/16/2022  . Hepatitis C Screening  Completed  . HIV Screening  Completed    Qualifies for Shingles Vaccine? yes  Cancer Screenings: Lung: Low Dose CT Chest recommended if Age 25-80 years, 30 pack-year currently smoking OR have quit w/in 15years. Patient does not qualify. Colorectal: up to date per patient  Additional Screenings:  Hepatitis C Screening: due      Plan:    No goals set. Declines vaccinations. Reports had colonoscopy 2-3 years ago.  I have personally reviewed and  noted the following in the patient's chart:   . Medical and social history . Use of alcohol, tobacco or illicit drugs  . Current medications and supplements . Functional ability and status . Nutritional status . Physical activity . Advanced directives . List of other physicians . Hospitalizations, surgeries, and ER visits in previous 12 months . Vitals . Screenings to include cognitive, depression, and falls . Referrals and appointments  In addition, I have reviewed and discussed with patient certain preventive protocols, quality metrics, and best practice recommendations. A written personalized care plan for preventive services as well as general preventive health recommendations were provided to patient.     Barb Merino, LPN   1/61/0960

## 2018-12-27 NOTE — Patient Instructions (Signed)
[name] , Thank you for taking time to come for your Medicare Wellness Visit. I appreciate your ongoing commitment to your health goals. Please review the following plan we discussed and let me know if I can assist you in the future.   Screening recommendations/referrals: Colonoscopy: 2-3 years ago per patient Recommended yearly ophthalmology/optometry visit for glaucoma screening and checkup Recommended yearly dental visit for hygiene and checkup  Vaccinations: Influenza vaccine: declines Pneumococcal vaccine: declines Tdap vaccine: doesn't remember Shingles vaccine: declines    Advanced directives: Please bring a copy of your POA (Power of Attorney) and/or Living Will to your next appointment.    Conditions/risks identified: Obesity  Next appointment: 02/06/2019 at 9:30a  Preventive Care 40-64 Years, Male Preventive care refers to lifestyle choices and visits with your health care provider that can promote health and wellness. What does preventive care include?  A yearly physical exam. This is also called an annual well check.  Dental exams once or twice a year.  Routine eye exams. Ask your health care provider how often you should have your eyes checked.  Personal lifestyle choices, including:  Daily care of your teeth and gums.  Regular physical activity.  Eating a healthy diet.  Avoiding tobacco and drug use.  Limiting alcohol use.  Practicing safe sex.  Taking low-dose aspirin every day starting at age 60. What happens during an annual well check? The services and screenings done by your health care provider during your annual well check will depend on your age, overall health, lifestyle risk factors, and family history of disease. Counseling  Your health care provider may ask you questions about your:  Alcohol use.  Tobacco use.  Drug use.  Emotional well-being.  Home and relationship well-being.  Sexual activity.  Eating habits.  Work and  work [employer]. Screening  You may have the following tests or measurements:  Height, weight, and BMI.  Blood pressure.  Lipid and cholesterol levels. These may be checked every 5 years, or more frequently if you are over 60 years old.  Skin check.  Lung cancer screening. You may have this screening every year starting at age 60 if you have a 30-pack-year history of smoking and currently smoke or have quit within the past 15 years.  Fecal occult blood test (FOBT) of the stool. You may have this test every year starting at age 60.  Flexible sigmoidoscopy or colonoscopy. You may have a sigmoidoscopy every 5 years or a colonoscopy every 10 years starting at age 60.  Prostate cancer screening. Recommendations will vary depending on your family history and other risks.  Hepatitis C blood test.  Hepatitis B blood test.  Sexually transmitted disease (STD) testing.  Diabetes screening. This is done by checking your blood sugar (glucose) after you have not eaten for a while (fasting). You may have this done every 1-3 years. Discuss your test results, treatment options, and if necessary, the need for more tests with your health care provider. Vaccines  Your health care provider may recommend certain vaccines, such as:  Influenza vaccine. This is recommended every year.  Tetanus, diphtheria, and acellular pertussis (Tdap, Td) vaccine. You may need a Td booster every 10 years.  Zoster vaccine. You may need this after age 60.  Pneumococcal 13-valent conjugate (PCV13) vaccine. You may need this if you have certain conditions and have not been vaccinated.  Pneumococcal polysaccharide (PPSV23) vaccine. You may need one or two doses if you smoke cigarettes or if you have certain conditions.  Talk to your health care provider about which screenings and vaccines you need and how often you need them. This information is not intended to replace advice given to you by your health care provider.  Make sure you discuss any questions you have with your health care provider. Document Released: 09/13/2015 Document Revised: 05/06/2016 Document Reviewed: 06/18/2015 Elsevier Interactive Patient Education  2017 [place] Prevention in the Home Falls can cause injuries. They can happen to people of all ages. There are many things you can do to make your home safe and to help prevent falls. What can I do on the outside of my home?  Regularly fix the edges of walkways and driveways and fix any cracks.  Remove anything that might make you trip as you walk through a door, such as a raised step or threshold.  Trim any bushes or trees on the path to your home.  Use bright outdoor lighting.  Clear any walking paths of anything that might make someone trip, such as rocks or tools.  Regularly check to see if handrails are loose or broken. Make sure that both sides of any steps have handrails.  Any raised decks and porches should have guardrails on the edges.  Have any leaves, snow, or ice cleared regularly.  Use sand or salt on walking paths during winter.  Clean up any spills in your garage right away. This includes oil or grease spills. What can I do in the bathroom?  Use night lights.  Install grab bars by the toilet and in the tub and shower. Do not use towel bars as grab bars.  Use non-skid mats or decals in the tub or shower.  If you need to sit down in the shower, use a plastic, non-slip stool.  Keep the floor dry. Clean up any water that spills on the floor as soon as it happens.  Remove soap buildup in the tub or shower regularly.  Attach bath mats securely with double-sided non-slip rug tape.  Do not have throw rugs and other things on the floor that can make you trip. What can I do in the bedroom?  Use night lights.  Make sure that you have a light by your bed that is easy to reach.  Do not use any sheets or blankets that are too big for your bed. They  should not hang down onto the floor.  Have a firm chair that has side arms. You can use this for support while you get dressed.  Do not have throw rugs and other things on the floor that can make you trip. What can I do in the kitchen?  Clean up any spills right away.  Avoid walking on wet floors.  Keep items that you use a lot in easy-to-reach places.  If you need to reach something above you, use a strong step stool that has a grab bar.  Keep electrical cords out of the way.  Do not use floor polish or wax that makes floors slippery. If you must use wax, use non-skid floor wax.  Do not have throw rugs and other things on the floor that can make you trip. What can I do with my stairs?  Do not leave any items on the stairs.  Make sure that there are handrails on both sides of the stairs and use them. Fix handrails that are broken or loose. Make sure that handrails are as long as the stairways.  Check any carpeting to make sure  that it is firmly attached to the stairs. Fix any carpet that is loose or worn.  Avoid having throw rugs at the top or bottom of the stairs. If you do have throw rugs, attach them to the floor with carpet tape.  Make sure that you have a light switch at the top of the stairs and the bottom of the stairs. If you do not have them, ask someone to add them for you. What else can I do to help prevent falls?  Wear shoes that:  Do not have high heels.  Have rubber bottoms.  Are comfortable and fit you well.  Are closed at the toe. Do not wear sandals.  If you use a stepladder:  Make sure that it is fully opened. Do not climb a closed stepladder.  Make sure that both sides of the stepladder are locked into place.  Ask someone to hold it for you, if possible.  Clearly mark and make sure that you can see:  Any grab bars or handrails.  First and last steps.  Where the edge of each step is.  Use tools that help you move around (mobility aids) if  they are needed. These include:  Canes.  Walkers.  Scooters.  Crutches.  Turn on the lights when you go into a dark area. Replace any light bulbs as soon as they burn out.  Set up your furniture so you have a clear path. Avoid moving your furniture around.  If any of your floors are uneven, fix them.  If there are any pets around you, be aware of where they are.  Review your medicines with your doctor. Some medicines can make you feel dizzy. This can increase your chance of falling. Ask your doctor what other things that you can do to help prevent falls. This information is not intended to replace advice given to you by your health care provider. Make sure you discuss any questions you have with your health care provider. Document Released: 06/13/2009 Document Revised: 01/23/2016 Document Reviewed: 09/21/2014 Elsevier Interactive Patient Education  2017 [employer].

## 2019-01-02 ENCOUNTER — Ambulatory Visit: Payer: 59 | Admitting: Nurse Practitioner

## 2019-02-06 ENCOUNTER — Other Ambulatory Visit: Payer: Self-pay

## 2019-02-06 ENCOUNTER — Encounter: Payer: Self-pay | Admitting: Nurse Practitioner

## 2019-02-06 ENCOUNTER — Ambulatory Visit (INDEPENDENT_AMBULATORY_CARE_PROVIDER_SITE_OTHER): Payer: 59 | Admitting: Nurse Practitioner

## 2019-02-06 VITALS — BP 132/80 | HR 79 | Temp 98.8°F | Ht 68.6 in | Wt 253.0 lb

## 2019-02-06 DIAGNOSIS — I10 Essential (primary) hypertension: Secondary | ICD-10-CM

## 2019-02-06 DIAGNOSIS — I5032 Chronic diastolic (congestive) heart failure: Secondary | ICD-10-CM | POA: Diagnosis not present

## 2019-02-06 DIAGNOSIS — I110 Hypertensive heart disease with heart failure: Secondary | ICD-10-CM | POA: Diagnosis not present

## 2019-02-06 DIAGNOSIS — E1165 Type 2 diabetes mellitus with hyperglycemia: Secondary | ICD-10-CM

## 2019-02-06 NOTE — Progress Notes (Signed)
Subjective:     Patient ID: Mark [name] , male    DOB: 1961-07-22 , 58 y.o.   MRN: [id number]   Chief Complaint  Patient presents with  . Diabetes    HPI  He has been riding a bike 3 times a week while he was out of work.    Diabetes  He presents for his follow-up diabetic visit. He has type 2 diabetes mellitus. There are no hypoglycemic associated symptoms. Pertinent negatives for hypoglycemia include no dizziness or headaches. There are no diabetic associated symptoms. Pertinent negatives for diabetes include no chest pain. There are no hypoglycemic complications. There are no diabetic complications. Risk factors for coronary artery disease include diabetes mellitus, hypertension, sedentary lifestyle, obesity and male sex. He is following a diabetic diet. He rarely participates in exercise. There is no change in his home blood glucose trend. (Blood sugar yesterday was 80's) An ACE inhibitor/angiotensin II receptor blocker is being taken. Eye exam is not current.     Past Medical History:  Diagnosis Date  . Chronic diastolic heart failure ([place]) 05/07/2018  . Diabetes mellitus without complication ([place])   . Hearing impaired   . Hypertension   . Sleep apnea      Family History  Problem Relation Age of Onset  . Hypertension Mother   . Hypertension Father      Current Outpatient Medications:  .  aspirin EC 81 MG EC tablet, Take 1 tablet (81 mg total) by mouth daily., Disp: 30 tablet, Rfl: 0 .  atorvastatin (LIPITOR) 20 MG tablet, TAKE 1 TABLET BY MOUTH EVERY DAY, Disp: 90 tablet, Rfl: 0 .  carvedilol (COREG) 6.25 MG tablet, TAKE 1 TABLET BY MOUTH 2 TIMES EVERY DAY WITH FOOD, Disp: 180 tablet, Rfl: 1 .  colchicine 0.6 MG tablet, Take 1 tablet (0.6 mg total) by mouth daily., Disp: 5 tablet, Rfl: 1 .  gabapentin (NEURONTIN) 100 MG capsule, TAKE 1 CAPSULE (100 MG TOTAL) BY MOUTH AT BEDTIME., Disp: 90 capsule, Rfl: 2 .  ipratropium-albuterol (DUONEB) 0.5-2.5 (3) MG/3ML SOLN, Take 3  mLs by nebulization every 6 (six) hours as needed., Disp: , Rfl:  .  losartan (COZAAR) 25 MG tablet, TAKE 1 TABLET BY MOUTH EVERY DAY, Disp: 90 tablet, Rfl: 0 .  metFORMIN (GLUCOPHAGE) 500 MG tablet, TAKE 1 TABLET BY MOUTH TWICE A DAY WITH MORNING AND EVENING MEALS, Disp: 180 tablet, Rfl: 1 .  torsemide (DEMADEX) 20 MG tablet, TAKE 2 TABLETS BY MOUTH EVERY DAY, Disp: 180 tablet, Rfl: 0   No Known Allergies   Review of Systems  Constitutional: Negative.   Respiratory: Negative.   Cardiovascular: Negative.  Negative for chest pain, palpitations and leg swelling.  Genitourinary: Negative.   Neurological: Negative for dizziness and headaches.  Psychiatric/Behavioral: Negative for agitation.     Today's Vitals   02/06/19 0928  BP: 132/80  Pulse: 79  Temp: 98.8 F (37.1 C)  TempSrc: Oral  Weight: 253 lb (114.8 kg)  Height: 5' 8.6" (1.742 m)  PainSc: 0-No pain   Body mass index is 37.8 kg/m.   Objective:  Physical Exam Constitutional:      Appearance: Normal appearance. He is obese.  Cardiovascular:     Rate and Rhythm: Normal rate and regular rhythm.     Pulses: Normal pulses.     Heart sounds: Murmur present.  Skin:    General: Skin is warm and dry.  Neurological:     General: No focal deficit present.  Mental Status: He is alert and oriented to person, place, and time.  Psychiatric:        Mood and Affect: Mood normal.        Behavior: Behavior normal.        Thought Content: Thought content normal.        Judgment: Judgment normal.         Assessment And Plan:     1. Type 2 diabetes mellitus with hyperglycemia, without long-term current use of insulin ([place])  He is doing well, and his last HgbA1c was 5.5.  He is now in normal range  Will check HgbA1c again today  Continue with exercising - Hemoglobin A1c; Future - BMP8+eGFR  2. Essential hypertension . B/P is controlled.  . CMP ordered to check renal function.  . The importance of regular exercise and  dietary modification was stressed to the patient. Continue with exercising regularly. He is doing well with his weight loss and remains stable - BMP8+eGFR  3. Chronic diastolic heart failure (HCC)  Stable,   He would like to have his torsemide changed due to cost. He is paying $139 for 3 months  I have advised him to discuss with cardiology    [name], FNP    THE PATIENT IS ENCOURAGED TO PRACTICE SOCIAL DISTANCING DUE TO THE COVID-19 PANDEMIC.

## 2019-02-07 LAB — BMP8+EGFR
BUN/Creatinine Ratio: 13 (ref 10–24)
BUN: 17 mg/dL (ref 8–27)
CO2: 24 mmol/L (ref 20–29)
Calcium: 9.6 mg/dL (ref 8.6–10.2)
Chloride: 102 mmol/L (ref 96–106)
Creatinine, Ser: 1.32 mg/dL — ABNORMAL HIGH (ref 0.76–1.27)
GFR calc Af Amer: 67 mL/min/{1.73_m2} (ref >59)
GFR calc non Af Amer: 58 mL/min/{1.73_m2} — ABNORMAL LOW (ref >59)
Glucose: 119 mg/dL — ABNORMAL HIGH (ref 65–99)
Potassium: 3.6 mmol/L (ref 3.5–5.2)
Sodium: 141 mmol/L (ref 134–144)

## 2019-02-07 LAB — HEMOGLOBIN A1C
Est. average glucose Bld gHb Est-mCnc: 131 mg/dL
Hgb A1c MFr Bld: 6.2 % — ABNORMAL HIGH (ref 4.8–5.6)

## 2019-02-15 ENCOUNTER — Other Ambulatory Visit: Payer: Self-pay | Admitting: Nurse Practitioner

## 2019-03-05 ENCOUNTER — Other Ambulatory Visit: Payer: Self-pay | Admitting: Nurse Practitioner

## 2019-03-23 ENCOUNTER — Other Ambulatory Visit: Payer: Self-pay | Admitting: Nurse Practitioner

## 2019-03-31 ENCOUNTER — Ambulatory Visit: Payer: Medicare Other | Admitting: Pulmonary Disease

## 2019-04-10 ENCOUNTER — Other Ambulatory Visit: Payer: Self-pay | Admitting: Nurse Practitioner

## 2019-04-10 ENCOUNTER — Ambulatory Visit: Payer: Medicare Other | Admitting: Primary Care

## 2019-04-17 ENCOUNTER — Ambulatory Visit: Payer: Medicare Other | Admitting: Primary Care

## 2019-04-17 NOTE — Progress Notes (Deleted)
 @Patient  ID: Mark [name], male    DOB: 02/23/1961, 58 y.o.   MRN: [id number]  No chief complaint on file.   Referring provider: Dorothyann PengSanders, Robyn, MD  HPI: 58 year old male, former smoker. PMH significant for sleep disordered breathing, possible COPD, acute respiratory failure, type 2 diabetes, cardiomegaly, chronic diastolic heart failure, HTN, LBBB, hearing loss. Patient of Dr. Craige CottaSood, last seen on 08/01/2018. Maintained on Torsemide and prn duonebs.  Follow-up as needed or if symptoms worsen.   04/17/2019 Patient presents today for follow-up apt.     No Known Allergies   There is no immunization history on file for this patient.  Past Medical History:  Diagnosis Date  . Chronic diastolic heart failure (HCC) 05/07/2018  . Diabetes mellitus without complication (HCC)   . Hearing impaired   . Hypertension   . Sleep apnea     Tobacco History: Social History   Tobacco Use  Smoking Status Former Smoker  . Years: 1.00  . Types: Cigarettes  . Quit date: 1999  . Years since quitting: 21.6  Smokeless Tobacco Never Used  Tobacco Comment   1 pack would last one month--08/01/18   Counseling given: Not Answered Comment: 1 pack would last one month--08/01/18   Outpatient Medications Prior to Visit  Medication Sig Dispense Refill  . aspirin EC 81 MG EC tablet Take 1 tablet (81 mg total) by mouth daily. 30 tablet 0  . atorvastatin (LIPITOR) 20 MG tablet TAKE 1 TABLET BY MOUTH EVERY DAY 90 tablet 0  . carvedilol (COREG) 6.25 MG tablet TAKE 1 TABLET BY MOUTH TWICE A DAY WITH FOOD 180 tablet 1  . colchicine 0.6 MG tablet Take 1 tablet (0.6 mg total) by mouth daily. 5 tablet 1  . gabapentin (NEURONTIN) 100 MG capsule TAKE 1 CAPSULE (100 MG TOTAL) BY MOUTH AT BEDTIME. 90 capsule 2  . ipratropium-albuterol (DUONEB) 0.5-2.5 (3) MG/3ML SOLN Take 3 mLs by nebulization every 6 (six) hours as needed.    Marland Kitchen. losartan (COZAAR) 25 MG tablet TAKE 1 TABLET BY MOUTH EVERY DAY 90 tablet 0  .  metFORMIN (GLUCOPHAGE) 500 MG tablet TAKE 1 TABLET BY MOUTH TWICE A DAY WITH BREAKFAST AND DINNER 180 tablet 1  . torsemide (DEMADEX) 20 MG tablet TAKE 2 TABLETS BY MOUTH EVERY DAY 180 tablet 0   No facility-administered medications prior to visit.       Review of Systems  Review of Systems   Physical Exam  There were no vitals taken for this visit. Physical Exam   Lab Results:  CBC    Component Value Date/Time   WBC 4.6 06/20/2018 1105   WBC 5.3 05/16/2018 0346   RBC 5.52 06/20/2018 1105   RBC 5.47 05/16/2018 0346   HGB 15.6 06/20/2018 1105   HCT 47.7 06/20/2018 1105   PLT 204 06/20/2018 1105   MCV 86 06/20/2018 1105   MCH 28.3 06/20/2018 1105   MCH 28.0 05/16/2018 0346   MCHC 32.7 06/20/2018 1105   MCHC 29.6 (L) 05/16/2018 0346   RDW 15.2 06/20/2018 1105   LYMPHSABS 1.4 07/21/2012 0558   MONOABS 0.4 07/21/2012 0558   EOSABS 0.2 07/21/2012 0558   BASOSABS 0.1 07/21/2012 0558    BMET    Component Value Date/Time   NA 141 02/06/2019 1013   K 3.6 02/06/2019 1013   CL 102 02/06/2019 1013   CO2 24 02/06/2019 1013   GLUCOSE 119 (H) 02/06/2019 1013   GLUCOSE 157 (H) 05/16/2018 0346   BUN  17 02/06/2019 1013   CREATININE 1.32 (H) 02/06/2019 1013   CALCIUM 9.6 02/06/2019 1013   GFRNONAA 58 (L) 02/06/2019 1013   GFRAA 67 02/06/2019 1013    BNP    Component Value Date/Time   BNP 654.7 (H) 05/07/2018 1823    ProBNP No results found for: PROBNP  Imaging: No results found.   Assessment & Plan:   No problem-specific Assessment & Plan notes found for this encounter.     Martyn Ehrich, NP 04/17/2019

## 2019-04-27 ENCOUNTER — Other Ambulatory Visit: Payer: Self-pay | Admitting: Internal Medicine

## 2019-04-28 ENCOUNTER — Telehealth: Payer: Self-pay | Admitting: *Deleted

## 2019-04-28 NOTE — Telephone Encounter (Signed)

## 2019-04-30 ENCOUNTER — Encounter: Payer: Self-pay | Admitting: Physician Assistant

## 2019-04-30 NOTE — Progress Notes (Addendum)
Virtual Visit via Video Note   This visit type was conducted due to national recommendations for restrictions regarding the COVID-19 Pandemic (e.g. social distancing) in an effort to limit this patient's exposure and mitigate transmission in our community.  Due to his co-morbid illnesses, this patient is at least at moderate risk for complications without adequate follow up.  This format is felt to be most appropriate for this patient at this time.  All issues noted in this document were discussed and addressed.  A limited physical exam was performed with this format.  Please refer to the patient's chart for his consent to telehealth for Mark [name].   Date:  05/01/2019   ID:  Mark [name], DOB 08/17/1961, MRN [id number]  Patient Location: Home Provider Location: Home  PCP:  Mark Brine, FNP Cardiologist:  Mark Him, MD  Electrophysiologist:  None  Evaluation Performed:  Follow-Up Visit  Chief Complaint:  6 month f/u CHF  History of Present Illness:    Mark [name] is a 57 y.o. male with type 2 diabetes mellitus, hypertension,morbid obesity, hyperlipidemia, CKD stage III, chronic diastolic CHF, mild aortic stenosis, mild pulmonary HTN, prior medical noncompliance and obstructive sleep apnea who presents for overdue 6 month follow-up. He was initially admitted September 2019 for acute diastolic CHF exacerbation. Echo at that time showed an EF of 50 to 55%, HK of anteroseptal and inferoseptal myocardium, grade 1 DD, severe hypertrophy of the septum with otherwise mild-moderate concentric hypertrophy, RV volume overload, mild AS, mild MR, moderately dilated RV with normal RV function, mild TR, moderate RAE, mildly increased PASP. Echo report states, "Aortic valve is poorly visualized but the mean aortic valve gradient is minimally elevated. Visually the valve does not appear abnormal enough to cause elevated gradients. Consider subvalvular or supravalvuluar stenosis. There is  severe hypertrophy of the basal septum which may be contributing." EKG showed left bundle branch block. Nuclear stress test was read as EF 37% although confirmed above higher with echo, no ischemia or infarction. He was discharged on Demadex. There is mention of COPD/restrictive lung disease in Mark [name] note, but Mark [name] note in 07/2018 states, "Possible COPD - PFT was more suggestive of restrictive defect and diffusion defect likely from obesity and CHF." Therefore he was only felt to require pulmonary f/u PRN. Last labs 01/2019 showed A1C 6.2, K 3.6, Cr 1.32, 08/2018 LDL 103 and persistently elevated alk phos level with normal AST/ALT (followed by PCP), 05/2018 CBC wnl, lipids followed in primary care.  Mark [name] is seen today virtually and eagerly shares that he is feeling great. He has cut back portion sizes and is exercising regularly on the bike without any angina, dyspnea, dizziness, or  functional limitation. He denies any orthopnea/PND. He does not follow his BP regularly but has been in the 120-130 range by most recent values in Epic. He does not wish to use his CPAP anymore at night as he no longer snores and has been sleeping great without any daytime fatigue. Weight loss has helped. He is agreeable to continuing nocturnal O2 though. The patient does not have symptoms concerning for COVID-19 infection (fever, chills, cough, or new shortness of breath).     Past Medical History:  Diagnosis Date   Abnormal lung function test    a. Reported to possibly be COPD, but per pulm note: "Possible COPD - PFT was more suggestive of restrictive defect and diffusion defect likely from obesity and CHF"   Chronic diastolic heart failure ([place])  05/07/2018   CKD (chronic kidney disease), stage III (HCC)    Diabetes mellitus type 2 in obese ([place])    Hearing impaired    Hyperlipidemia    Hypertension    LBBB (left bundle branch block)    LVH (left ventricular hypertrophy)    Morbid obesity  ([place])    Sleep apnea    Past Surgical History:  Procedure Laterality Date   BACK SURGERY     TRACHEOSTOMY       Current Meds  Medication Sig   aspirin EC 81 MG EC tablet Take 1 tablet (81 mg total) by mouth daily.   carvedilol (COREG) 6.25 MG tablet TAKE 1 TABLET BY MOUTH TWICE A DAY WITH FOOD   colchicine 0.6 MG tablet Take 1 tablet (0.6 mg total) by mouth daily.   gabapentin (NEURONTIN) 100 MG capsule TAKE 1 CAPSULE (100 MG TOTAL) BY MOUTH AT BEDTIME.   ipratropium-albuterol (DUONEB) 0.5-2.5 (3) MG/3ML SOLN Take 3 mLs by nebulization every 6 (six) hours as needed.   losartan (COZAAR) 25 MG tablet TAKE 1 TABLET BY MOUTH EVERY DAY   metFORMIN (GLUCOPHAGE) 500 MG tablet TAKE 1 TABLET BY MOUTH TWICE A DAY WITH BREAKFAST AND DINNER   torsemide (DEMADEX) 20 MG tablet TAKE 2 TABLETS BY MOUTH EVERY DAY     Allergies:   Patient has no known allergies.   Social History   Tobacco Use   Smoking status: Former Smoker    Years: 1.00    Types: Cigarettes    Quit date: 1999    Years since quitting: 21.6   Smokeless tobacco: Never Used   Tobacco comment: 1 pack would last one month--08/01/18  Substance Use Topics   Alcohol use: No   Drug use: No     Family Hx: The patient's family history includes Hypertension in his father and mother.  ROS:   Please see the history of present illness.    All other systems reviewed and are negative.   Prior CV studies:    Most recent pertinent cardiac studies are outlined above.  Labs/Other Tests and Data Reviewed:    EKG:  An ECG dated 05/08/18 was personally reviewed today and demonstrated:  NSR 91bpm with LBBB  Recent Labs: 05/07/2018: B Natriuretic Peptide 654.7 05/08/2018: TSH 2.202 06/20/2018: Hemoglobin 15.6; Platelets 204 09/26/2018: ALT 21 02/06/2019: BUN 17; Creatinine, Ser 1.32; Potassium 3.6; Sodium 141   Recent Lipid Panel Lab Results  Component Value Date/Time   CHOL 185 09/26/2018 10:18 AM   TRIG 153 (H)  09/26/2018 10:18 AM   HDL 51 09/26/2018 10:18 AM   CHOLHDL 3.6 09/26/2018 10:18 AM   CHOLHDL 2.8 05/08/2018 05:20 AM   LDLCALC 103 (H) 09/26/2018 10:18 AM    Wt Readings from Last 3 Encounters:  05/01/19 245 lb (111.1 kg)  02/06/19 253 lb (114.8 kg)  12/27/18 250 lb (113.4 kg)     Objective:    Vital Signs:  BP 137/60    Temp (!) 97.4 F (36.3 C)    Ht '5\' 8"'  (1.727 m)    Wt 245 lb (111.1 kg)    BMI 37.25 kg/m    VS reviewed. General - pleasant AAM in no acute distress HEENT - NCAT, EOM intact Pulm - No labored breathing, no coughing during visit, no audible wheezing, speaking in full sentences Neuro - A+Ox3, no slurred speech, answers questions appropriately Psych - Pleasant affect     ASSESSMENT & PLAN:    1. Chronic diastolic CHF with h/o  abnormal echocardiogram - clinically doing very well by report. Nuclear stress test previously was without ischemia. 2D echo suggested mild aortic stenosis with possibility of subvalvular or supravalvular stenosis but clinically he has no symptoms to suggest progression. Continue present regimen and follow BP as outlined below. Reviewed 2g sodium restriction, 2L fluid restriction, daily weights with patient. He does admit drinking a lot of fluid a day - we discussed scaling back which may help with his BP (see below). Addendum: I discussed prior echo results/clinical scenario with Dr. Radford Pax who recommends yearly echocardiogram given his prior pulmonary HTN (mild in 05/2018). Will pass on message to RMA to arrange and let pt know. 2. CKD stage III - recent Cr stable on labs, baseline appears 1.1-1.3. 3. Essential HTN - slightly above goal today (<130/80). The patient was instructed to monitor their blood pressure at home over the next few days 3 hours after meds and call on Thurs/Fri with results. If this remains suboptimal with room on HR to move, would titrate carvedilol to 12.71m BID. He has a f/u appt with primary care on 05/15/19 which would be  good time to reassess this.  4. Hyperlipidemia - this is managed by primary care. Given history of DM, would consider addition of statin therapy but will defer to PCP - likely also needs their eval of his persistently elevated alk phos as well.. May consider recheck lipids at their f/u visit in 05/2019 given his recent weight loss/lifestyle modifications. 5. LBBB - no symptoms of bradycardia. Reports HR is in the 90s today - range 60s-90s by last values in chart.  COVID-19 Education: The signs and symptoms of COVID-19 were discussed with the patient and how to seek care for testing (follow up with PCP or arrange E-visit).  The importance of social distancing was discussed today.  Time:   Today, I have spent 18 minutes with the patient with telehealth technology discussing the above problems.     Medication Adjustments/Labs and Tests Ordered: Current medicines are reviewed at length with the patient today.  Concerns regarding medicines are outlined above.   Disposition:  Follow up in 6 months with Dr. TRadford Paxin person.  Signed, DCharlie Pitter PA-C  05/01/2019 10:57 AM    [place]

## 2019-05-01 ENCOUNTER — Other Ambulatory Visit: Payer: Self-pay

## 2019-05-01 ENCOUNTER — Telehealth (INDEPENDENT_AMBULATORY_CARE_PROVIDER_SITE_OTHER): Payer: 59 | Admitting: Physician Assistant

## 2019-05-01 ENCOUNTER — Telehealth: Payer: Self-pay | Admitting: Physician Assistant

## 2019-05-01 ENCOUNTER — Encounter: Payer: Self-pay | Admitting: Physician Assistant

## 2019-05-01 VITALS — BP 137/60 | Temp 97.4°F | Ht 68.0 in | Wt 245.0 lb

## 2019-05-01 DIAGNOSIS — I272 Pulmonary hypertension, unspecified: Secondary | ICD-10-CM

## 2019-05-01 DIAGNOSIS — I5032 Chronic diastolic (congestive) heart failure: Secondary | ICD-10-CM

## 2019-05-01 DIAGNOSIS — I447 Left bundle-branch block, unspecified: Secondary | ICD-10-CM

## 2019-05-01 DIAGNOSIS — E785 Hyperlipidemia, unspecified: Secondary | ICD-10-CM

## 2019-05-01 DIAGNOSIS — N183 Chronic kidney disease, stage 3 unspecified: Secondary | ICD-10-CM

## 2019-05-01 DIAGNOSIS — I350 Nonrheumatic aortic (valve) stenosis: Secondary | ICD-10-CM

## 2019-05-01 DIAGNOSIS — I10 Essential (primary) hypertension: Secondary | ICD-10-CM

## 2019-05-01 NOTE — Telephone Encounter (Addendum)
    Please let pt know I reviewed his prior echo report with Dr .Radford Pax to inquire if there was anything on there that would require routine follow-up. She would like for him to have a repeat echocardiogram in September to follow up his elevated lung pressures (pulmonary HTN). Please arrange. Dayna Dunn PA-C

## 2019-05-01 NOTE — Progress Notes (Signed)
Dont need to follow [place] but does need yearly echo for pulmonary HTN

## 2019-05-01 NOTE — Telephone Encounter (Signed)
Call placed to pt re: message below.  Left a message for pt to call back.  Pt needs a 2Decho in September. Order in [place].

## 2019-05-01 NOTE — Patient Instructions (Signed)
Medication Instructions:  Your physician recommends that you continue on your current medications as directed. Please refer to the Current Medication list given to you today.  If you need a refill on your cardiac medications before your next appointment, please call your pharmacy.   Lab work: None ordered  If you have labs (blood work) drawn today and your tests are completely normal, you will receive your results only by: Marland Kitchen MyChart Message (if you have MyChart) OR . A paper copy in the mail If you have any lab test that is abnormal or we need to change your treatment, we will call you to review the results.  Testing/Procedures: None ordered  Follow-Up: At [facility], you and your health needs are our priority.  As part of our continuing mission to provide you with exceptional heart care, we have created designated Provider Care Teams.  These Care Teams include your primary Cardiologist (physician) and Advanced Practice Providers (APPs -  Physician Assistants and Nurse Practitioners) who all work together to provide you with the care you need, when you need it. You will need a follow up appointment in 6 months.  Please call our office 2 months in advance to schedule this appointment.  You may see Fransico Him, MD or one of the following Advanced Practice Providers on your designated Care Team:   [place], PA-C Melina Copa, PA-C . Ermalinda Barrios, PA-C  Any Other Special Instructions Will Be Listed Below (If Applicable). Please get a blood pressure cuff that goes on your arm. The wrist ones can be inaccurate. If possible, try to select one that also reports your heart rate. To check your blood pressure, choose a time about 3 hours after taking your blood pressure medicines. Remain seated in a chair for 5 minutes quietly beforehand, then check it. Please monitor your blood pressure occasionally at home. Call your doctor if you tend to get readings of greater than 130 on the top number  or 80 on the bottom number.   Follow up with primary care for refill of your gabapentin - this is not a heart medicine so our office does not routinely prescribe it.   For patients with congestive heart failure, we give them these special instructions:  1. Follow a low-salt diet - you are allowed no more than 2,000mg  of sodium per day. Watch your fluid intake. In general, you should not be taking in more than 2 liters of fluid per day (no more than 8 glasses per day). This includes sources of water in foods like soup, coffee, tea, milk, etc.  2. Weigh yourself on the same scale at same time of day and keep a log.  3. Call your doctor: (Anytime you feel any of the following symptoms)  - 3lb weight gain overnight or 5lb within a few days  - Shortness of breath, with or without a dry hacking cough  - Swelling in the hands, feet or stomach  - If you have to sleep on extra pillows at night in order to breathe  IT IS IMPORTANT TO LET YOUR DOCTOR KNOW EARLY ON IF YOU ARE HAVING SYMPTOMS SO WE CAN HELP YOU!

## 2019-05-09 NOTE — Telephone Encounter (Signed)
2nd attempt to reach pt re: needs 2 D echo in September. Left a message for him to call back.

## 2019-05-15 ENCOUNTER — Ambulatory Visit: Payer: Medicare Other | Admitting: Nurse Practitioner

## 2019-05-22 ENCOUNTER — Ambulatory Visit (INDEPENDENT_AMBULATORY_CARE_PROVIDER_SITE_OTHER): Payer: 59 | Admitting: Nurse Practitioner

## 2019-05-22 ENCOUNTER — Encounter: Payer: Self-pay | Admitting: Nurse Practitioner

## 2019-05-22 ENCOUNTER — Other Ambulatory Visit: Payer: Self-pay

## 2019-05-22 VITALS — BP 120/88 | HR 63 | Temp 98.4°F | Ht 68.8 in | Wt 250.8 lb

## 2019-05-22 DIAGNOSIS — Z6841 Body Mass Index (BMI) 40.0 and over, adult: Secondary | ICD-10-CM

## 2019-05-22 DIAGNOSIS — I5032 Chronic diastolic (congestive) heart failure: Secondary | ICD-10-CM | POA: Diagnosis not present

## 2019-05-22 DIAGNOSIS — I110 Hypertensive heart disease with heart failure: Secondary | ICD-10-CM

## 2019-05-22 DIAGNOSIS — E1165 Type 2 diabetes mellitus with hyperglycemia: Secondary | ICD-10-CM | POA: Diagnosis not present

## 2019-05-22 DIAGNOSIS — I10 Essential (primary) hypertension: Secondary | ICD-10-CM | POA: Diagnosis not present

## 2019-05-22 DIAGNOSIS — I739 Peripheral vascular disease, unspecified: Secondary | ICD-10-CM

## 2019-05-22 NOTE — Progress Notes (Signed)
Subjective:     Patient ID: Mark [name] , male    DOB: 07-14-1961 , 58 y.o.   MRN: [id number]   Chief Complaint  Patient presents with  . Diabetes    HPI  He has been using eliptical occasionally  Wt Readings from Last 3 Encounters: 05/22/19 : 250 lb 12.8 oz (113.8 kg) 05/01/19 : 245 lb (111.1 kg) 02/06/19 : 253 lb (114.8 kg)   Diabetes He presents for his follow-up diabetic visit. He has type 2 diabetes mellitus. There are no hypoglycemic associated symptoms. Pertinent negatives for hypoglycemia include no dizziness or headaches. There are no diabetic associated symptoms. Pertinent negatives for diabetes include no chest pain. There are no hypoglycemic complications. There are no diabetic complications. Risk factors for coronary artery disease include diabetes mellitus, hypertension, sedentary lifestyle, obesity and male sex. He is compliant with treatment most of the time. He is following a diabetic diet. He has not had a previous visit with a dietitian. He rarely participates in exercise. There is no change in his home blood glucose trend. (Blood sugar yesterday was 80's) An ACE inhibitor/angiotensin II receptor blocker is being taken. Eye exam is not current.     Past Medical History:  Diagnosis Date  . Abnormal lung function test    a. Reported to possibly be COPD, but per pulm note: "Possible COPD - PFT was more suggestive of restrictive defect and diffusion defect likely from obesity and CHF"  . Chronic diastolic heart failure ([place]) 05/07/2018  . CKD (chronic kidney disease), stage III ([place])   . Diabetes mellitus type 2 in obese ([place])   . Hearing impaired   . Hyperlipidemia   . Hypertension   . LBBB (left bundle branch block)   . LVH (left ventricular hypertrophy)   . Mild aortic stenosis   . Mild pulmonary hypertension ([place])   . Morbid obesity ([place])   . Sleep apnea      Family History  Problem Relation Age of Onset  . Hypertension Mother   . Hypertension Father       Current Outpatient Medications:  .  aspirin EC 81 MG EC tablet, Take 1 tablet (81 mg total) by mouth daily., Disp: 30 tablet, Rfl: 0 .  carvedilol (COREG) 6.25 MG tablet, TAKE 1 TABLET BY MOUTH TWICE A DAY WITH FOOD, Disp: 180 tablet, Rfl: 1 .  colchicine 0.6 MG tablet, Take 1 tablet (0.6 mg total) by mouth daily., Disp: 5 tablet, Rfl: 1 .  gabapentin (NEURONTIN) 100 MG capsule, TAKE 1 CAPSULE (100 MG TOTAL) BY MOUTH AT BEDTIME., Disp: 90 capsule, Rfl: 2 .  losartan (COZAAR) 25 MG tablet, TAKE 1 TABLET BY MOUTH EVERY DAY, Disp: 90 tablet, Rfl: 0 .  metFORMIN (GLUCOPHAGE) 500 MG tablet, TAKE 1 TABLET BY MOUTH TWICE A DAY WITH BREAKFAST AND DINNER, Disp: 180 tablet, Rfl: 1 .  torsemide (DEMADEX) 20 MG tablet, TAKE 2 TABLETS BY MOUTH EVERY DAY, Disp: 180 tablet, Rfl: 0 .  ipratropium-albuterol (DUONEB) 0.5-2.5 (3) MG/3ML SOLN, Take 3 mLs by nebulization every 6 (six) hours as needed., Disp: , Rfl:    No Known Allergies   Review of Systems  Constitutional: Negative.   Respiratory: Negative.   Cardiovascular: Negative for chest pain, palpitations and leg swelling.  Neurological: Negative for dizziness and headaches.     Today's Vitals   05/22/19 1037  BP: 120/88  Pulse: 63  Temp: 98.4 F (36.9 C)  TempSrc: Oral  Weight: 250 lb 12.8 oz (113.8 kg)  Height: 5' 8.8" (1.748 m)  PainSc: 0-No pain   Body mass index is 37.25 kg/m.   Objective:  Physical Exam Constitutional:      Appearance: Normal appearance.  Cardiovascular:     Rate and Rhythm: Normal rate and regular rhythm.     Pulses: Normal pulses.     Heart sounds: Normal heart sounds. No murmur.  Pulmonary:     Effort: Pulmonary effort is normal. No respiratory distress.     Breath sounds: Normal breath sounds.  Skin:    Capillary Refill: Capillary refill takes less than 2 seconds.  Neurological:     General: No focal deficit present.     Mental Status: He is alert and oriented to person, place, and time.          Assessment And Plan:      1. Type 2 diabetes mellitus with hyperglycemia, without long-term current use of insulin (HCC)  Chronic  I have referred him to ophthalmology  Tolerating metformin only well  Encouraged to continue to limit intake of sugary foods and drinks. - Hemoglobin A1c - BMP8+eGFR - Ambulatory referral to Ophthalmology  2. Essential hypertension  Chronic, good control  Continue with current medications  Continue with follow up with Cardiology - BMP8+eGFR  3. Chronic diastolic heart failure ([place])  Continue follow up with Cardiology  No lower extremity edema noted  [name], FNP    THE PATIENT IS ENCOURAGED TO PRACTICE SOCIAL DISTANCING DUE TO THE COVID-19 PANDEMIC.

## 2019-05-23 LAB — BMP8+EGFR
BUN/Creatinine Ratio: 13 (ref 10–24)
BUN: 17 mg/dL (ref 8–27)
CO2: 27 mmol/L (ref 20–29)
Calcium: 10.1 mg/dL (ref 8.6–10.2)
Chloride: 98 mmol/L (ref 96–106)
Creatinine, Ser: 1.33 mg/dL — ABNORMAL HIGH (ref 0.76–1.27)
GFR calc Af Amer: 67 mL/min/{1.73_m2} (ref >59)
GFR calc non Af Amer: 58 mL/min/{1.73_m2} — ABNORMAL LOW (ref >59)
Glucose: 114 mg/dL — ABNORMAL HIGH (ref 65–99)
Potassium: 4.1 mmol/L (ref 3.5–5.2)
Sodium: 141 mmol/L (ref 134–144)

## 2019-05-23 LAB — HEMOGLOBIN A1C
Est. average glucose Bld gHb Est-mCnc: 137 mg/dL
Hgb A1c MFr Bld: 6.4 % — ABNORMAL HIGH (ref 4.8–5.6)

## 2019-05-28 ENCOUNTER — Encounter: Payer: Self-pay | Admitting: Nurse Practitioner

## 2019-05-28 DIAGNOSIS — I739 Peripheral vascular disease, unspecified: Secondary | ICD-10-CM | POA: Insufficient documentation

## 2019-05-29 ENCOUNTER — Other Ambulatory Visit: Payer: Self-pay

## 2019-05-29 ENCOUNTER — Telehealth: Payer: Self-pay | Admitting: *Deleted

## 2019-05-29 ENCOUNTER — Ambulatory Visit (HOSPITAL_COMMUNITY): Payer: 59 | Attending: Internal Medicine

## 2019-05-29 DIAGNOSIS — I272 Pulmonary hypertension, unspecified: Secondary | ICD-10-CM

## 2019-05-29 NOTE — Telephone Encounter (Signed)
Call placed to pt re: echocardiogram results, left a message for pt to call back.  

## 2019-05-29 NOTE — Telephone Encounter (Signed)
-----   Message from Charlie Pitter, [place] sent at 05/29/2019  1:06 PM EDT ----- Please let patient know echocardiogram overall looked good. Showed normal heart squeeze function. The echocardiogram suggested there is some stiffening of the heart muscle which we know he has had in the past -this is what we call diastolic dysfunction. In addition to good blood pressure control and achieving or maintaining a healthy weight, would advise to generally stick to lower sodium diet (aiming for maximum 2,000mg  per day) and staying hydrated but not to excess. In general about 64oz per day of all fluid intake would be an ideal maximum. There is mild leaking of mitral valve which we typically follow up with an echo again around 3-5 years, but Dr. Radford Pax will follow him before then to decide if it needed to be sooner. The pressures in his lungs are better - good news (previously mild pulmonary HTN, now normal). No change in plan! Lisbeth Renshaw Dunn PA-C

## 2019-06-02 ENCOUNTER — Other Ambulatory Visit: Payer: Self-pay | Admitting: Nurse Practitioner

## 2019-06-06 ENCOUNTER — Telehealth: Payer: Self-pay | Admitting: *Deleted

## 2019-06-06 NOTE — Telephone Encounter (Signed)
Pt has been made aware of his echocardiogram results, see result note.

## 2019-06-14 ENCOUNTER — Other Ambulatory Visit: Payer: Self-pay | Admitting: Nurse Practitioner

## 2019-07-17 ENCOUNTER — Other Ambulatory Visit: Payer: Self-pay | Admitting: Nurse Practitioner

## 2019-07-18 ENCOUNTER — Other Ambulatory Visit: Payer: Self-pay | Admitting: Nurse Practitioner

## 2019-08-15 ENCOUNTER — Other Ambulatory Visit: Payer: Self-pay | Admitting: Nurse Practitioner

## 2019-08-21 ENCOUNTER — Ambulatory Visit: Payer: 59 | Admitting: Nurse Practitioner

## 2019-09-02 ENCOUNTER — Other Ambulatory Visit: Payer: Self-pay | Admitting: Nurse Practitioner

## 2019-10-07 ENCOUNTER — Other Ambulatory Visit: Payer: Self-pay | Admitting: Nurse Practitioner

## 2019-10-09 ENCOUNTER — Other Ambulatory Visit: Payer: Self-pay | Admitting: Nurse Practitioner

## 2019-10-10 ENCOUNTER — Other Ambulatory Visit: Payer: Self-pay | Admitting: Nurse Practitioner

## 2019-10-19 ENCOUNTER — Other Ambulatory Visit: Payer: Self-pay | Admitting: Nurse Practitioner

## 2019-11-03 ENCOUNTER — Other Ambulatory Visit: Payer: Self-pay | Admitting: Nurse Practitioner

## 2019-12-11 ENCOUNTER — Other Ambulatory Visit: Payer: Self-pay | Admitting: Nurse Practitioner

## 2019-12-25 ENCOUNTER — Ambulatory Visit: Payer: Medicare Other | Admitting: Podiatry

## 2019-12-28 ENCOUNTER — Telehealth: Payer: Self-pay

## 2019-12-28 ENCOUNTER — Ambulatory Visit: Payer: 59

## 2019-12-28 ENCOUNTER — Ambulatory Visit: Payer: Medicare Other | Admitting: Nurse Practitioner

## 2019-12-28 ENCOUNTER — Ambulatory Visit: Payer: Medicare Other

## 2019-12-28 NOTE — Telephone Encounter (Signed)
This nurse called patient in regards to missing today's scheduled appointment. Patient stated that he attempted to call and reschedule. Rescheduled to 01/10/2020

## 2020-01-10 ENCOUNTER — Telehealth: Payer: Self-pay

## 2020-01-10 ENCOUNTER — Other Ambulatory Visit: Payer: Self-pay | Admitting: Nurse Practitioner

## 2020-01-10 ENCOUNTER — Ambulatory Visit: Payer: Medicare Other | Admitting: Nurse Practitioner

## 2020-01-10 ENCOUNTER — Ambulatory Visit: Payer: Medicare Other

## 2020-01-10 NOTE — Telephone Encounter (Signed)
This nurse contacted patient in regards to missing today's appointment. He states he was trying to call to reschedule. We rescheduled for 01/31/2020.

## 2020-01-15 ENCOUNTER — Ambulatory Visit: Payer: Medicare Other | Admitting: Podiatry

## 2020-01-15 DIAGNOSIS — E041 Nontoxic single thyroid nodule: Secondary | ICD-10-CM | POA: Insufficient documentation

## 2020-01-31 ENCOUNTER — Ambulatory Visit: Payer: No Typology Code available for payment source | Admitting: Nurse Practitioner

## 2020-01-31 ENCOUNTER — Ambulatory Visit: Payer: 59

## 2020-02-05 ENCOUNTER — Other Ambulatory Visit: Payer: Self-pay

## 2020-02-05 ENCOUNTER — Encounter: Payer: Self-pay | Admitting: Nurse Practitioner

## 2020-02-05 ENCOUNTER — Ambulatory Visit (INDEPENDENT_AMBULATORY_CARE_PROVIDER_SITE_OTHER): Payer: No Typology Code available for payment source

## 2020-02-05 ENCOUNTER — Ambulatory Visit (INDEPENDENT_AMBULATORY_CARE_PROVIDER_SITE_OTHER): Payer: No Typology Code available for payment source | Admitting: Podiatry

## 2020-02-05 VITALS — Temp 97.2°F

## 2020-02-05 DIAGNOSIS — M205X1 Other deformities of toe(s) (acquired), right foot: Secondary | ICD-10-CM

## 2020-02-05 DIAGNOSIS — M19071 Primary osteoarthritis, right ankle and foot: Secondary | ICD-10-CM | POA: Diagnosis not present

## 2020-02-05 NOTE — Progress Notes (Signed)
HPI: 59 y.o. male presenting today as a new patient referral from Dr. Elijah Birk, podiatrist, for evaluation of right great toe pain.  Patient states that he has been dealing with this for years and he does recall a history of injury to the right great toe at 1 point.  He is received injections, anti-inflammatory pills, and has been wearing a boot without any significant alleviation of symptoms.  He presents for further treatment evaluation and possible surgical consultation  Past Medical History:  Diagnosis Date  . Abnormal lung function test    a. Reported to possibly be COPD, but per pulm note: "Possible COPD - PFT was more suggestive of restrictive defect and diffusion defect likely from obesity and CHF"  . Chronic diastolic heart failure (HCC) 05/07/2018  . CKD (chronic kidney disease), stage III (HCC)   . Diabetes mellitus type 2 in obese (HCC)   . Elevated troponin 05/07/2018  . Hearing impaired   . Hyperlipidemia   . Hypertension   . LBBB (left bundle branch block)   . LVH (left ventricular hypertrophy)   . Mild aortic stenosis   . Mild pulmonary hypertension (HCC)   . Morbid obesity (HCC)   . Sleep apnea      Physical Exam: General: The patient is alert and oriented x3 in no acute distress.  Dermatology: Skin is warm, dry and supple bilateral lower extremities. Negative for open lesions or macerations.  Vascular: Palpable pedal pulses bilaterally. No edema or erythema noted. Capillary refill within normal limits.  Neurological: Epicritic and protective threshold grossly intact bilaterally.   Musculoskeletal Exam: Range of motion within normal limits to all pedal and ankle joints bilateral. Muscle strength 5/5 in all groups bilateral.  Limited range of motion with crepitus and pain associated to the first MTPJ of the right foot.  There is a large palpable exostosis around the joint as well  Radiographic Exam:  Normal osseous mineralization.  Joint space destruction with  periarticular spurring noted to the first MTPJ of the right foot.  This is advanced stage arthritis  Assessment: 1.  Hallux limitus right first MTPJ 2.  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 Patient evaluated. X-Rays reviewed.  2.  The patient has tried multiple conservative modalities without any successful alleviation of symptoms for the patient.  I do believe the patient would benefit from surgical arthroplasty with implant.  All possible complications and details the procedure were explained.  No guarantees were expressed or implied.  All patient questions were answered.  The patient agrees and would like to proceed with surgery. 3.  Authorization for surgery was initiated today.  Surgery will consist of right first MTPJ arthroplasty with implant. 4.  The patient does have a complex past medical history so wound will require medical clearance from his PCP prior to surgery 5.  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 Felecia Shelling, DPM    [address], [place] [address]                Office [phone])  [phone]  Fax [phone]

## 2020-02-05 NOTE — Patient Instructions (Signed)
Pre-Operative Instructions  Congratulations, you have decided to take an important step towards improving your quality of life.  You can be assured that the doctors and staff at [facility] will be with you every step of the way.  Here are some important things you should know:  1. Plan to be at the surgery center/hospital at least 1 (one) hour prior to your scheduled time, unless otherwise directed by the surgical center/hospital staff.  You must have a responsible adult accompany you, remain during the surgery and drive you home.  Make sure you have directions to the surgical center/hospital to ensure you arrive on time. 2. If you are having surgery at [facility], you will need a copy of your medical history and physical form from your family physician within one month prior to the date of surgery. We will give you a form for your primary physician to complete.  3. We make every effort to accommodate the date you request for surgery.  However, there are times where surgery dates or times have to be moved.  We will contact you as soon as possible if a change in schedule is required.   4. No aspirin/ibuprofen for one week before surgery.  If you are on aspirin, any non-steroidal anti-inflammatory medications (Mobic, Aleve, Ibuprofen) should not be taken seven (7) days prior to your surgery.  You make take Tylenol for pain prior to surgery.  5. Medications - If you are taking daily heart and blood pressure medications, seizure, reflux, allergy, asthma, anxiety, pain or diabetes medications, make sure you notify the surgery center/hospital before the day of surgery so they can tell you which medications you should take or avoid the day of surgery. 6. No food or drink after midnight the night before surgery unless directed otherwise by surgical center/hospital staff. 7. No alcoholic beverages 24-hours prior to surgery.  No smoking 24-hours prior or 24-hours after  surgery. 8. Wear loose pants or shorts. They should be loose enough to fit over bandages, boots, and casts. 9. Don't wear slip-on shoes. Sneakers are preferred. 10. Bring your boot with you to the surgery center/hospital.  Also bring crutches or a walker if your physician has prescribed it for you.  If you do not have this equipment, it will be provided for you after surgery. 11. If you have not been contacted by the surgery center/hospital by the day before your surgery, call to confirm the date and time of your surgery. 12. Leave-time from work may vary depending on the type of surgery you have.  Appropriate arrangements should be made prior to surgery with your employer. 13. Prescriptions will be provided immediately following surgery by your doctor.  Fill these as soon as possible after surgery and take the medication as directed. Pain medications will not be refilled on weekends and must be approved by the doctor. 14. Remove nail polish on the operative foot and avoid getting pedicures prior to surgery. 15. Wash the night before surgery.  The night before surgery wash the foot and leg well with water and the antibacterial soap provided. Be sure to pay special attention to beneath the toenails and in between the toes.  Wash for at least three (3) minutes. Rinse thoroughly with water and dry well with a towel.  Perform this wash unless told not to do so by your physician.  Enclosed: 1 Ice pack (please put in freezer the night before surgery)   1 Hibiclens skin cleaner     Pre-op instructions  If you have any questions regarding the instructions, please do not hesitate to call our office.  [place]: [address], [place], [place] [address] -- [phone]  [place]: [address] [place], [place] [address] -- [phone]  [place]: [address], , [place] [address] -- [phone]   Website: [email or web address] 

## 2020-02-12 ENCOUNTER — Encounter: Payer: Self-pay | Admitting: Podiatry

## 2020-02-17 ENCOUNTER — Other Ambulatory Visit: Payer: Self-pay | Admitting: Nurse Practitioner

## 2020-02-26 ENCOUNTER — Encounter: Payer: Self-pay | Admitting: Nurse Practitioner

## 2020-02-28 ENCOUNTER — Telehealth: Payer: Self-pay

## 2020-02-28 ENCOUNTER — Other Ambulatory Visit: Payer: Self-pay | Admitting: Nurse Practitioner

## 2020-02-28 NOTE — Telephone Encounter (Signed)
Received a call from [name], FNP office stating they can not give [place] medical clearance for Robson's surgery on 02/29/2020. Sam has not been seen since 05/2019 and no showed on his last appointment. I spoke to [facility] and notified him that he will need to see his PCP before was can reschedule his surgery. Also notified Aram Beecham with [facility].

## 2020-02-28 NOTE — Telephone Encounter (Signed)
Shelly from [facility] called stating she has sent over a medical clearance letter for pt and he is scheduled for surgery on 7/1 and they want to get the status on that. [phone]  I returned her call and advised her that [name] DNP,FNP-BC cant clear him for surgery due to him last being seen in 05/2019. She stated she would call and notify the pt. YL,RMA

## 2020-03-06 ENCOUNTER — Encounter: Payer: Medicare Other | Admitting: Podiatry

## 2020-03-06 ENCOUNTER — Other Ambulatory Visit: Payer: Self-pay | Admitting: Nurse Practitioner

## 2020-03-07 ENCOUNTER — Ambulatory Visit (INDEPENDENT_AMBULATORY_CARE_PROVIDER_SITE_OTHER): Payer: No Typology Code available for payment source

## 2020-03-07 ENCOUNTER — Other Ambulatory Visit: Payer: Self-pay

## 2020-03-07 ENCOUNTER — Ambulatory Visit (INDEPENDENT_AMBULATORY_CARE_PROVIDER_SITE_OTHER): Payer: No Typology Code available for payment source | Admitting: Nurse Practitioner

## 2020-03-07 VITALS — BP 136/84 | HR 71 | Temp 98.0°F | Ht 68.4 in | Wt 251.0 lb

## 2020-03-07 VITALS — BP 136/84 | HR 71 | Temp 98.0°F | Ht 68.4 in | Wt 251.8 lb

## 2020-03-07 DIAGNOSIS — I5032 Chronic diastolic (congestive) heart failure: Secondary | ICD-10-CM | POA: Diagnosis not present

## 2020-03-07 DIAGNOSIS — Z0000 Encounter for general adult medical examination without abnormal findings: Secondary | ICD-10-CM

## 2020-03-07 DIAGNOSIS — E1165 Type 2 diabetes mellitus with hyperglycemia: Secondary | ICD-10-CM

## 2020-03-07 DIAGNOSIS — M79671 Pain in right foot: Secondary | ICD-10-CM

## 2020-03-07 DIAGNOSIS — I10 Essential (primary) hypertension: Secondary | ICD-10-CM | POA: Diagnosis not present

## 2020-03-07 LAB — POCT URINALYSIS DIPSTICK
Bilirubin, UA: NEGATIVE
Blood, UA: NEGATIVE
Glucose, UA: NEGATIVE
Ketones, UA: NEGATIVE
Leukocytes, UA: NEGATIVE
Nitrite, UA: NEGATIVE
Protein, UA: NEGATIVE
Spec Grav, UA: 1.01 (ref 1.010–1.025)
Urobilinogen, UA: 0.2 E.U./dL
pH, UA: 5.5 (ref 5.0–8.0)

## 2020-03-07 LAB — POCT UA - MICROALBUMIN
Albumin/Creatinine Ratio, Urine, POC: <30
Creatinine, POC: 100 mg/dL
Microalbumin Ur, POC: 10 mg/L

## 2020-03-07 MED ORDER — TORSEMIDE 20 MG PO TABS: 40.0000 mg | ORAL_TABLET | Freq: Every day | ORAL | 0 refills | Status: DC

## 2020-03-07 NOTE — Progress Notes (Signed)
Subjective:     Patient ID: Mark [name] , male    DOB: 12-08-1960 , 59 y.o.   MRN: [id number]   Chief Complaint  Patient presents with   Diabetes   Hypertension    HPI  He has been taking care of his mother who is going through the early stages of dementia.  He has been trying to work as well.    Wt Readings from Last 3 Encounters: 03/07/20 : 251 lb 12.8 oz (114.2 kg) 05/22/19 : 250 lb 12.8 oz (113.8 kg) 05/01/19 : 245 lb (111.1 kg)   Diabetes He presents for his follow-up diabetic visit. He has type 2 diabetes mellitus. There are no hypoglycemic associated symptoms. Pertinent negatives for hypoglycemia include no dizziness or headaches. There are no diabetic associated symptoms. Pertinent negatives for diabetes include no chest pain. There are no hypoglycemic complications. There are no diabetic complications. Risk factors for coronary artery disease include diabetes mellitus, hypertension, sedentary lifestyle, obesity and male sex. He is compliant with treatment most of the time. He is following a diabetic diet. He has not had a previous visit with a dietitian. He rarely participates in exercise. There is no change in his home blood glucose trend. (Blood sugar yesterday was 80's) An ACE inhibitor/angiotensin II receptor blocker is being taken. Eye exam is not current.  Hypertension Pertinent negatives include no chest pain, headaches or palpitations.     Past Medical History:  Diagnosis Date   Abnormal lung function test    a. Reported to possibly be COPD, but per pulm note: "Possible COPD - PFT was more suggestive of restrictive defect and diffusion defect likely from obesity and CHF"   Chronic diastolic heart failure (HCC) 05/07/2018   CKD (chronic kidney disease), stage III    Diabetes mellitus type 2 in obese [facility])    Elevated troponin 05/07/2018   Hearing impaired    Hyperlipidemia    Hypertension    LBBB (left bundle branch block)    LVH (left ventricular  hypertrophy)    Mild aortic stenosis    Mild pulmonary hypertension (HCC)    Morbid obesity (HCC)    Sleep apnea      Family History  Problem Relation Age of Onset   Hypertension Mother    Hypertension Father      Current Outpatient Medications:    aspirin EC 81 MG EC tablet, Take 1 tablet (81 mg total) by mouth daily., Disp: 30 tablet, Rfl: 0   atorvastatin (LIPITOR) 20 MG tablet, TAKE 1 TABLET BY MOUTH EVERY DAY, Disp: 90 tablet, Rfl: 0   carvedilol (COREG) 6.25 MG tablet, TAKE 1 TABLET BY MOUTH TWICE A DAY WITH FOOD, Disp: 180 tablet, Rfl: 1   colchicine 0.6 MG tablet, Take 1 tablet (0.6 mg total) by mouth daily., Disp: 5 tablet, Rfl: 1   gabapentin (NEURONTIN) 100 MG capsule, TAKE 1 CAPSULE (100 MG TOTAL) BY MOUTH AT BEDTIME., Disp: 90 capsule, Rfl: 2   ipratropium-albuterol (DUONEB) 0.5-2.5 (3) MG/3ML SOLN, Take 3 mLs by nebulization every 6 (six) hours as needed., Disp: , Rfl:    losartan (COZAAR) 25 MG tablet, TAKE 1 TABLET BY MOUTH EVERY DAY, Disp: 90 tablet, Rfl: 0   meloxicam (MOBIC) 7.5 MG tablet, Take 7.5 mg by mouth daily., Disp: , Rfl:    metFORMIN (GLUCOPHAGE) 500 MG tablet, TAKE 1 TABLET BY MOUTH TWICE A DAY WITH BREAKFAST AND DINNER, Disp: 60 tablet, Rfl: 0   torsemide (DEMADEX) 20 MG tablet, TAKE 2  TABLETS BY MOUTH EVERY DAY, Disp: 180 tablet, Rfl: 0   No Known Allergies   Review of Systems  Constitutional: Negative.   Respiratory: Negative.   Cardiovascular: Negative for chest pain, palpitations and leg swelling.  Neurological: Negative for dizziness and headaches.     Today's Vitals   03/07/20 1104  BP: 136/84  Pulse: 71  Temp: 98 F (36.7 C)  TempSrc: Oral  SpO2: 98%  Weight: 251 lb 12.8 oz (114.2 kg)  Height: 5' 8.4" (1.737 m)  PainSc: 0-No pain   Body mass index is 37.84 kg/m.   Objective:  Physical Exam Constitutional:      Appearance: Normal appearance.  Cardiovascular:     Rate and Rhythm: Normal rate and regular rhythm.      Pulses: Normal pulses.     Heart sounds: Normal heart sounds. No murmur heard.   Pulmonary:     Effort: Pulmonary effort is normal. No respiratory distress.     Breath sounds: Normal breath sounds.  Skin:    Capillary Refill: Capillary refill takes less than 2 seconds.  Neurological:     General: No focal deficit present.     Mental Status: He is alert and oriented to person, place, and time.         Assessment And Plan:      1. Type 2 diabetes mellitus with hyperglycemia, without long-term current use of insulin (HCC)  Chronic, has not been seen in more than 6 months   He is planning to have right foot surgery and needs medical clearance.   Once results will send message to Podiatry  I have advised him to also make cardiology aware, number given..  Diabetic foot exam done decreased sensation - POCT Urinalysis Dipstick (81002) - POCT UA - Microalbumin - Hemoglobin A1c - Lipid panel  2. Essential hypertension  Chronic, good control  Continue with current medications  Continue with follow up with Cardiology  EKG done no changes from  previous - CMP14 + Anion Gap - EKG 12-Lead  3. Chronic diastolic heart failure (HCC)  Continue follow up with Cardiology  No lower extremity edema noted  I have advised him to contact Cardiology I did refill his torsemide so he would not run out - CBC no Diff - torsemide (DEMADEX) 20 MG tablet; Take 2 tablets (40 mg total) by mouth daily.  Dispense: 180 tablet; Refill:   4. Right foot pain  He is awaiting surgery and medical clearance pending labs can clear him medically.     [name], FNP    THE PATIENT IS ENCOURAGED TO PRACTICE SOCIAL DISTANCING DUE TO THE COVID-19 PANDEMIC.

## 2020-03-07 NOTE — Progress Notes (Signed)
This visit occurred during the SARS-CoV-2 public health emergency.  Safety protocols were in place, including screening questions prior to the visit, additional usage of staff PPE, and extensive cleaning of exam room while observing appropriate contact time as indicated for disinfecting solutions.  Subjective:   Mark [name] is a 59 y.o. male who presents for Medicare Annual/Subsequent preventive examination.  Review of Systems     Cardiac Risk Factors include: diabetes mellitus;hypertension;dyslipidemia;male gender;obesity (BMI >30kg/m2)     Objective:    Today's Vitals   03/07/20 1107  BP: 136/84  Pulse: 71  Temp: 98 F (36.7 C)  TempSrc: Oral  Weight: 251 lb (113.9 kg)  Height: 5' 8.4" (1.737 m)   Body mass index is 37.72 kg/m.  Advanced Directives 03/07/2020 12/27/2018 05/07/2018  Does Patient Have a Medical Advance Directive? No Yes No  Type of Advance Directive - Living will -  Would patient like information on creating a medical advance directive? No - Patient declined - No - Patient declined    Current Medications (verified) Outpatient Encounter Medications as of 03/07/2020  Medication Sig  . aspirin EC 81 MG EC tablet Take 1 tablet (81 mg total) by mouth daily.  Marland Kitchen atorvastatin (LIPITOR) 20 MG tablet TAKE 1 TABLET BY MOUTH EVERY DAY  . carvedilol (COREG) 6.25 MG tablet TAKE 1 TABLET BY MOUTH TWICE A DAY WITH FOOD  . colchicine 0.6 MG tablet Take 1 tablet (0.6 mg total) by mouth daily.  Marland Kitchen gabapentin (NEURONTIN) 100 MG capsule TAKE 1 CAPSULE (100 MG TOTAL) BY MOUTH AT BEDTIME.  Marland Kitchen ipratropium-albuterol (DUONEB) 0.5-2.5 (3) MG/3ML SOLN Take 3 mLs by nebulization every 6 (six) hours as needed.  Marland Kitchen losartan (COZAAR) 25 MG tablet TAKE 1 TABLET BY MOUTH EVERY DAY  . meloxicam (MOBIC) 7.5 MG tablet Take 7.5 mg by mouth daily.  . metFORMIN (GLUCOPHAGE) 500 MG tablet TAKE 1 TABLET BY MOUTH TWICE A DAY WITH BREAKFAST AND DINNER  . torsemide (DEMADEX) 20 MG tablet TAKE 2 TABLETS  BY MOUTH EVERY DAY   No facility-administered encounter medications on file as of 03/07/2020.    Allergies (verified) Patient has no known allergies.   History: Past Medical History:  Diagnosis Date  . Abnormal lung function test    a. Reported to possibly be COPD, but per pulm note: "Possible COPD - PFT was more suggestive of restrictive defect and diffusion defect likely from obesity and CHF"  . Chronic diastolic heart failure (HCC) 05/07/2018  . CKD (chronic kidney disease), stage III   . Diabetes mellitus type 2 in obese (HCC)   . Elevated troponin 05/07/2018  . Hearing impaired   . Hyperlipidemia   . Hypertension   . LBBB (left bundle branch block)   . LVH (left ventricular hypertrophy)   . Mild aortic stenosis   . Mild pulmonary hypertension (HCC)   . Morbid obesity (HCC)   . Sleep apnea    Past Surgical History:  Procedure Laterality Date  . BACK SURGERY    . TRACHEOSTOMY     Family History  Problem Relation Age of Onset  . Hypertension Mother   . Hypertension Father    Social History   Socioeconomic History  . Marital status: Married    Spouse name: Not on file  . Number of children: Not on file  . Years of education: Not on file  . Highest education level: Not on file  Occupational History  . Occupation: employed  Tobacco Use  . Smoking status: Former  Smoker    Years: 1.00    Types: Cigarettes    Quit date: 1999    Years since quitting: 22.5  . Smokeless tobacco: Never Used  . Tobacco comment: 1 pack would last one month--08/01/18  Vaping Use  . Vaping Use: Never used  Substance and Sexual Activity  . Alcohol use: No  . Drug use: No  . Sexual activity: Not Currently  Other Topics Concern  . Not on file  Social History Narrative  . Not on file   Social Determinants of Health   Financial Resource Strain: Low Risk   . Difficulty of Paying Living Expenses: Not hard at all  Food Insecurity: No Food Insecurity  . Worried About Programme researcher, broadcasting/film/video in  the Last Year: Never true  . Ran Out of Food in the Last Year: Never true  Transportation Needs: No Transportation Needs  . Lack of Transportation (Medical): No  . Lack of Transportation (Non-Medical): No  Physical Activity: Sufficiently Active  . Days of Exercise per Week: 7 days  . Minutes of Exercise per Session: 30 min  Stress: No Stress Concern Present  . Feeling of Stress : Not at all  Social Connections:   . Frequency of Communication with Friends and Family:   . Frequency of Social Gatherings with Friends and Family:   . Attends Religious Services:   . Active Member of Clubs or Organizations:   . Attends Banker Meetings:   Marland Kitchen Marital Status:     Tobacco Counseling Counseling given: Not Answered Comment: 1 pack would last one month--08/01/18   Clinical Intake:  Pre-visit preparation completed: Yes  Pain : No/denies pain     Nutritional Status: BMI > 30  Obese Nutritional Risks: None Diabetes: Yes  How often do you need to have someone help you when you read instructions, pamphlets, or other written materials from your doctor or pharmacy?: 1 - Never What is the last grade level you completed in school?: 12th grade  Diabetic? Yes Nutrition Risk Assessment:  Has the patient had any N/V/D within the last 2 months?  No  Does the patient have any non-healing wounds?  No  Has the patient had any unintentional weight loss or weight gain?  No   Diabetes:  Is the patient diabetic?  Yes  If diabetic, was a CBG obtained today?  No  Did the patient bring in their glucometer from home?  No  How often do you monitor your CBG's? Checks once in a blue moon.   Financial Strains and Diabetes Management:  Are you having any financial strains with the device, your supplies or your medication? No .  Does the patient want to be seen by Chronic Care Management for management of their diabetes?  No  Would the patient like to be referred to a Nutritionist or for  Diabetic Management?  No   Diabetic Exams:  Diabetic Eye Exam: Overdue for diabetic eye exam. Pt has been advised about the importance in completing this exam. Patient advised to call and schedule an eye exam. Diabetic Foot Exam: Completed today   Interpreter Needed?: No  Information entered by :: NAllen LPN   Activities of Daily Living In your present state of health, do you have any difficulty performing the following activities: 03/07/2020 03/07/2020  Hearing? Y Y  Comment wears hearing aides wears hearing aids  Vision? N N  Comment wears reading glasses -  Difficulty concentrating or making decisions? N N  Walking or  climbing stairs? N N  Dressing or bathing? N N  Doing errands, shopping? N N  Preparing Food and eating ? N -  Using the Toilet? N -  In the past six months, have you accidently leaked urine? N -  Do you have problems with loss of bowel control? N -  Managing your Medications? N -  Managing your Finances? N -  Housekeeping or managing your Housekeeping? N -  Some recent data might be hidden    Patient Care Team: [name], FNP as PCP - General (General Practice) Quintella Reichert, MD as PCP - Cardiology (Cardiology)  Indicate any recent Medical Services you may have received from other than [facility] providers in the past year (date may be approximate).     Assessment:   This is a routine wellness examination for Horice.  Hearing/Vision screen  Hearing Screening   125Hz  250Hz  500Hz  1000Hz  2000Hz  3000Hz  4000Hz  6000Hz  8000Hz   Right ear:           Left ear:           Vision Screening Comments: No regular eye exams, does not trust  Dietary issues and exercise activities discussed: Current Exercise Habits: Home exercise routine, Type of exercise: Other - see comments (stationary bike), Time (Minutes): 30, Frequency (Times/Week): 7, Weekly Exercise (Minutes/Week): 210  Goals    . Patient Stated     No goals    . Patient Stated     03/07/2020, no goals        Depression Screen PHQ 2/9 Scores 03/07/2020 05/22/2019 02/06/2019 12/27/2018 09/26/2018 06/20/2018  PHQ - 2 Score 0 0 0 0 0 0  PHQ- 9 Score - - - 0 - -    Fall Risk Fall Risk  03/07/2020 05/22/2019 02/06/2019 12/27/2018 09/26/2018  Falls in the past year? 0 0 0 0 0  Risk for fall due to : Medication side effect - - - -  Follow up Falls evaluation completed;Education provided;Falls prevention discussed - - Falls prevention discussed -    Any stairs in or around the home? No  If so, are there any without handrails? n/a Home free of loose throw rugs in walkways, pet beds, electrical cords, etc? Yes  Adequate lighting in your home to reduce risk of falls? Yes   ASSISTIVE DEVICES UTILIZED TO PREVENT FALLS:  Life alert? No  Use of a cane, walker or w/c? No  Grab bars in the bathroom? No  Shower chair or bench in shower? No  Elevated toilet seat or a handicapped toilet? No   TIMED UP AND GO:  Was the test performed? No .    Gait steady and fast without use of assistive device  Cognitive Function:     6CIT Screen 03/07/2020 12/27/2018  What Year? 0 points 0 points  What month? 0 points 0 points  What time? 0 points 0 points  Count back from 20 0 points 0 points  Months in reverse 4 points 4 points  Repeat phrase 0 points 2 points  Total Score 4 6    Immunizations Immunization History  Administered Date(s) Administered  . PFIZER SARS-COV-2 Vaccination 02/01/2020, 02/22/2020    TDAP status: Up to date Flu Vaccine status: Declined, Education has been provided regarding the importance of this vaccine but patient still declined. Advised may receive this vaccine at local pharmacy or Health Dept. Aware to provide a copy of the vaccination record if obtained from local pharmacy or Health Dept. Verbalized acceptance and understanding. Pneumococcal  vaccine status: Declined,  Education has been provided regarding the importance of this vaccine but patient still declined. Advised may receive this  vaccine at local pharmacy or Health Dept. Aware to provide a copy of the vaccination record if obtained from local pharmacy or Health Dept. Verbalized acceptance and understanding.  Covid-19 vaccine status: Completed vaccines  Qualifies for Shingles Vaccine? Yes   Zostavax completed No   Shingrix Completed?: No.    Education has been provided regarding the importance of this vaccine. Patient has been advised to call insurance company to determine out of pocket expense if they have not yet received this vaccine. Advised may also receive vaccine at local pharmacy or Health Dept. Verbalized acceptance and understanding.  Screening Tests Health Maintenance  Topic Date Due  . FOOT EXAM  Never done  . OPHTHALMOLOGY EXAM  Never done  . HEMOGLOBIN A1C  11/19/2019  . COLONOSCOPY  03/07/2021 (Originally 10/09/2008)  . PNEUMOCOCCAL POLYSACCHARIDE VACCINE AGE 51-64 HIGH RISK  03/07/2021 (Originally 10/09/1962)  . INFLUENZA VACCINE  03/31/2020  . TETANUS/TDAP  10/16/2022  . COVID-19 Vaccine  Completed  . Hepatitis C Screening  Completed  . HIV Screening  Completed    Health Maintenance  Health Maintenance Due  Topic Date Due  . FOOT EXAM  Never done  . OPHTHALMOLOGY EXAM  Never done  . HEMOGLOBIN A1C  11/19/2019    Colorectal cancer screening: Decline  Lung Cancer Screening: (Low Dose CT Chest recommended if Age 74-80 years, 30 pack-year currently smoking OR have quit w/in 15years.) does not qualify.   Lung Cancer Screening Referral: no  Additional Screening:  Hepatitis C Screening: does qualify; Completed 06/20/2018  Vision Screening: Recommended annual ophthalmology exams for early detection of glaucoma and other disorders of the eye. Is the patient up to date with their annual eye exam?  No  Who is the provider or what is the name of the office in which the patient attends annual eye exams? Does not have one If pt is not established with a provider, would they like to be referred to a  provider to establish care? No .   Dental Screening: Recommended annual dental exams for proper oral hygiene  Community Resource Referral / Chronic Care Management: CRR required this visit?  No   CCM required this visit?  No      Plan:     I have personally reviewed and noted the following in the patient's chart:   . Medical and social history . Use of alcohol, tobacco or illicit drugs  . Current medications and supplements . Functional ability and status . Nutritional status . Physical activity . Advanced directives . List of other physicians . Hospitalizations, surgeries, and ER visits in previous 12 months . Vitals . Screenings to include cognitive, depression, and falls . Referrals and appointments  In addition, I have reviewed and discussed with patient certain preventive protocols, quality metrics, and best practice recommendations. A written personalized care plan for preventive services as well as general preventive health recommendations were provided to patient.     Barb Merinoickeah E Margee Trentham, LPN   1/6/10967/03/2020   Nurse Notes: Declines colonoscopy and pneumonia vaccine. States does not trust eye doctor's so he will not go and get his eyes checked.

## 2020-03-07 NOTE — Patient Instructions (Signed)
Mark [name] , Thank you for taking time to come for your Medicare Wellness Visit. I appreciate your ongoing commitment to your health goals. Please review the following plan we discussed and let me know if I can assist you in the future.   Screening recommendations/referrals: Colonoscopy: declines Recommended yearly ophthalmology/optometry visit for glaucoma screening and checkup Recommended yearly dental visit for hygiene and checkup  Vaccinations: Influenza vaccine: declines Pneumococcal vaccine: declines Tdap vaccine: completed 10/16/2012, due 10/16/2022 Shingles vaccine: discussed   Covid-19: 02/22/2020, 02/01/2020  Advanced directives: Advance directive discussed with you today. Even though you declined this today please call our office should you change your mind and we can give you the proper paperwork for you to fill out.  Conditions/risks identified: none  Next appointment: Follow up in one year for your annual wellness visit   Preventive Care 40-64 Years, Male Preventive care refers to lifestyle choices and visits with your health care provider that can promote health and wellness. What does preventive care include?  A yearly physical exam. This is also called an annual well check.  Dental exams once or twice a year.  Routine eye exams. Ask your health care provider how often you should have your eyes checked.  Personal lifestyle choices, including:  Daily care of your teeth and gums.  Regular physical activity.  Eating a healthy diet.  Avoiding tobacco and drug use.  Limiting alcohol use.  Practicing safe sex.  Taking low-dose aspirin every day starting at age 4. What happens during an annual well check? The services and screenings done by your health care provider during your annual well check will depend on your age, overall health, lifestyle risk factors, and family history of disease. Counseling  Your health care provider may ask you questions about  your:  Alcohol use.  Tobacco use.  Drug use.  Emotional well-being.  Home and relationship well-being.  Sexual activity.  Eating habits.  Work and work Astronomer. Screening  You may have the following tests or measurements:  Height, weight, and BMI.  Blood pressure.  Lipid and cholesterol levels. These may be checked every 5 years, or more frequently if you are over 5 years old.  Skin check.  Lung cancer screening. You may have this screening every year starting at age 63 if you have a 30-pack-year history of smoking and currently smoke or have quit within the past 15 years.  Fecal occult blood test (FOBT) of the stool. You may have this test every year starting at age 59.  Flexible sigmoidoscopy or colonoscopy. You may have a sigmoidoscopy every 5 years or a colonoscopy every 10 years starting at age 57.  Prostate cancer screening. Recommendations will vary depending on your family history and other risks.  Hepatitis C blood test.  Hepatitis B blood test.  Sexually transmitted disease (STD) testing.  Diabetes screening. This is done by checking your blood sugar (glucose) after you have not eaten for a while (fasting). You may have this done every 1-3 years. Discuss your test results, treatment options, and if necessary, the need for more tests with your health care provider. Vaccines  Your health care provider may recommend certain vaccines, such as:  Influenza vaccine. This is recommended every year.  Tetanus, diphtheria, and acellular pertussis (Tdap, Td) vaccine. You may need a Td booster every 10 years.  Zoster vaccine. You may need this after age 31.  Pneumococcal 13-valent conjugate (PCV13) vaccine. You may need this if you have certain conditions and have not  been vaccinated.  Pneumococcal polysaccharide (PPSV23) vaccine. You may need one or two doses if you smoke cigarettes or if you have certain conditions. Talk to your health care provider about  which screenings and vaccines you need and how often you need them. This information is not intended to replace advice given to you by your health care provider. Make sure you discuss any questions you have with your health care provider. Document Released: 09/13/2015 Document Revised: 05/06/2016 Document Reviewed: 06/18/2015 Elsevier Interactive Patient Education  2017 [employer].  Fall Prevention in the Home Falls can cause injuries. They can happen to people of all ages. There are many things you can do to make your home safe and to help prevent falls. What can I do on the outside of my home?  Regularly fix the edges of walkways and driveways and fix any cracks.  Remove anything that might make you trip as you walk through a door, such as a raised step or threshold.  Trim any bushes or trees on the path to your home.  Use bright outdoor lighting.  Clear any walking paths of anything that might make someone trip, such as rocks or tools.  Regularly check to see if handrails are loose or broken. Make sure that both sides of any steps have handrails.  Any raised decks and porches should have guardrails on the edges.  Have any leaves, snow, or ice cleared regularly.  Use sand or salt on walking paths during winter.  Clean up any spills in your garage right away. This includes oil or grease spills. What can I do in the bathroom?  Use night lights.  Install grab bars by the toilet and in the tub and shower. Do not use towel bars as grab bars.  Use non-skid mats or decals in the tub or shower.  If you need to sit down in the shower, use a plastic, non-slip stool.  Keep the floor dry. Clean up any water that spills on the floor as soon as it happens.  Remove soap buildup in the tub or shower regularly.  Attach bath mats securely with double-sided non-slip rug tape.  Do not have throw rugs and other things on the floor that can make you trip. What can I do in the  bedroom?  Use night lights.  Make sure that you have a light by your bed that is easy to reach.  Do not use any sheets or blankets that are too big for your bed. They should not hang down onto the floor.  Have a firm chair that has side arms. You can use this for support while you get dressed.  Do not have throw rugs and other things on the floor that can make you trip. What can I do in the kitchen?  Clean up any spills right away.  Avoid walking on wet floors.  Keep items that you use a lot in easy-to-reach places.  If you need to reach something above you, use a strong step stool that has a grab bar.  Keep electrical cords out of the way.  Do not use floor polish or wax that makes floors slippery. If you must use wax, use non-skid floor wax.  Do not have throw rugs and other things on the floor that can make you trip. What can I do with my stairs?  Do not leave any items on the stairs.  Make sure that there are handrails on both sides of the stairs and use them.  Fix handrails that are broken or loose. Make sure that handrails are as long as the stairways.  Check any carpeting to make sure that it is firmly attached to the stairs. Fix any carpet that is loose or worn.  Avoid having throw rugs at the top or bottom of the stairs. If you do have throw rugs, attach them to the floor with carpet tape.  Make sure that you have a light switch at the top of the stairs and the bottom of the stairs. If you do not have them, ask someone to add them for you. What else can I do to help prevent falls?  Wear shoes that:  Do not have high heels.  Have rubber bottoms.  Are comfortable and fit you well.  Are closed at the toe. Do not wear sandals.  If you use a stepladder:  Make sure that it is fully opened. Do not climb a closed stepladder.  Make sure that both sides of the stepladder are locked into place.  Ask someone to hold it for you, if possible.  Clearly mark and make  sure that you can see:  Any grab bars or handrails.  First and last steps.  Where the edge of each step is.  Use tools that help you move around (mobility aids) if they are needed. These include:  Canes.  Walkers.  Scooters.  Crutches.  Turn on the lights when you go into a dark area. Replace any light bulbs as soon as they burn out.  Set up your furniture so you have a clear path. Avoid moving your furniture around.  If any of your floors are uneven, fix them.  If there are any pets around you, be aware of where they are.  Review your medicines with your doctor. Some medicines can make you feel dizzy. This can increase your chance of falling. Ask your doctor what other things that you can do to help prevent falls. This information is not intended to replace advice given to you by your health care provider. Make sure you discuss any questions you have with your health care provider. Document Released: 06/13/2009 Document Revised: 01/23/2016 Document Reviewed: 09/21/2014 Elsevier Interactive Patient Education  2017 [employer].

## 2020-03-08 ENCOUNTER — Telehealth: Payer: Self-pay

## 2020-03-08 ENCOUNTER — Encounter: Payer: Self-pay | Admitting: Nurse Practitioner

## 2020-03-08 LAB — CMP14 + ANION GAP
ALT: 18 IU/L (ref 0–44)
AST: 15 IU/L (ref 0–40)
Albumin/Globulin Ratio: 1.7 (ref 1.2–2.2)
Albumin: 4.8 g/dL (ref 3.8–4.8)
Alkaline Phosphatase: 122 IU/L — ABNORMAL HIGH (ref 48–121)
Anion Gap: 13 mmol/L (ref 10.0–18.0)
BUN/Creatinine Ratio: 16 (ref 10–24)
BUN: 20 mg/dL (ref 8–27)
Bilirubin Total: 0.6 mg/dL (ref 0.0–1.2)
CO2: 29 mmol/L (ref 20–29)
Calcium: 10.2 mg/dL (ref 8.6–10.2)
Chloride: 101 mmol/L (ref 96–106)
Creatinine, Ser: 1.28 mg/dL — ABNORMAL HIGH (ref 0.76–1.27)
GFR calc Af Amer: 69 mL/min/{1.73_m2} (ref >59)
GFR calc non Af Amer: 60 mL/min/{1.73_m2} (ref >59)
Globulin, Total: 2.8 g/dL (ref 1.5–4.5)
Glucose: 99 mg/dL (ref 65–99)
Potassium: 4.1 mmol/L (ref 3.5–5.2)
Sodium: 143 mmol/L (ref 134–144)
Total Protein: 7.6 g/dL (ref 6.0–8.5)

## 2020-03-08 LAB — LIPID PANEL
Chol/HDL Ratio: 3.6 ratio (ref 0.0–5.0)
Cholesterol, Total: 193 mg/dL (ref 100–199)
HDL: 53 mg/dL (ref >39)
LDL Chol Calc (NIH): 111 mg/dL — ABNORMAL HIGH (ref 0–99)
Triglycerides: 168 mg/dL — ABNORMAL HIGH (ref 0–149)
VLDL Cholesterol Cal: 29 mg/dL (ref 5–40)

## 2020-03-08 LAB — CBC
Hematocrit: 44.4 % (ref 37.5–51.0)
Hemoglobin: 15.1 g/dL (ref 13.0–17.7)
MCH: 31.3 pg (ref 26.6–33.0)
MCHC: 34 g/dL (ref 31.5–35.7)
MCV: 92 fL (ref 79–97)
Platelets: 202 10*3/uL (ref 150–450)
RBC: 4.83 x10E6/uL (ref 4.14–5.80)
RDW: 13.7 % (ref 11.6–15.4)
WBC: 6.8 10*3/uL (ref 3.4–10.8)

## 2020-03-08 LAB — HEMOGLOBIN A1C
Est. average glucose Bld gHb Est-mCnc: 134 mg/dL
Hgb A1c MFr Bld: 6.3 % — ABNORMAL HIGH (ref 4.8–5.6)

## 2020-03-08 NOTE — Telephone Encounter (Signed)
lvm- attempt tp give lab results

## 2020-03-08 NOTE — Telephone Encounter (Signed)
-----   Message from [name], FNP sent at 03/08/2020 10:46 AM EDT ----- Your kidney functions are stable, liver functions are essentially normal.  Blood levels are normal. Your cholesterol levels 193 goal is less than 199,  triglycerides levels are 168 goal is less than 150, LDL level is 111 goal is less than 99 limit your intake of fried and fatty foods.  Increase your fiber intake.  HgbA1c is 6.3 Continue to limit your intake of sugary foods and drinks.  We will complete your form for medical clearance.  Make sure you contact the cardiologist as well.

## 2020-03-13 ENCOUNTER — Encounter: Payer: Self-pay | Admitting: Nurse Practitioner

## 2020-03-18 ENCOUNTER — Encounter: Payer: No Typology Code available for payment source | Admitting: Podiatry

## 2020-04-01 ENCOUNTER — Encounter: Payer: No Typology Code available for payment source | Admitting: Podiatry

## 2020-04-02 ENCOUNTER — Telehealth: Payer: Self-pay

## 2020-04-02 ENCOUNTER — Other Ambulatory Visit: Payer: Self-pay | Admitting: Nurse Practitioner

## 2020-04-02 NOTE — Telephone Encounter (Signed)
I called patient to see if he has an appointment set up with his eye dr or if he needs [place] to refer him to one left pt v/m to call the office [facility]

## 2020-04-11 ENCOUNTER — Other Ambulatory Visit: Payer: Self-pay | Admitting: Podiatry

## 2020-04-11 DIAGNOSIS — M2011 Hallux valgus (acquired), right foot: Secondary | ICD-10-CM

## 2020-04-11 MED ORDER — OXYCODONE-ACETAMINOPHEN 5-325 MG PO TABS: 1.0000 | ORAL_TABLET | ORAL | 0 refills | Status: DC | PRN

## 2020-04-11 NOTE — Progress Notes (Signed)
PRN postop 

## 2020-04-15 ENCOUNTER — Other Ambulatory Visit: Payer: Self-pay | Admitting: Nurse Practitioner

## 2020-04-17 ENCOUNTER — Other Ambulatory Visit: Payer: Self-pay

## 2020-04-17 ENCOUNTER — Ambulatory Visit (INDEPENDENT_AMBULATORY_CARE_PROVIDER_SITE_OTHER): Payer: No Typology Code available for payment source

## 2020-04-17 ENCOUNTER — Ambulatory Visit (INDEPENDENT_AMBULATORY_CARE_PROVIDER_SITE_OTHER): Payer: No Typology Code available for payment source | Admitting: Podiatry

## 2020-04-17 DIAGNOSIS — M205X1 Other deformities of toe(s) (acquired), right foot: Secondary | ICD-10-CM | POA: Diagnosis not present

## 2020-04-17 DIAGNOSIS — Z9889 Other specified postprocedural states: Secondary | ICD-10-CM

## 2020-04-17 NOTE — Progress Notes (Signed)
   Subjective:  Patient presents today status post right great toe arthroplasty with implant. DOS: 04/11/2020.  Patient states he is doing well.  The pain has improved over the past week.  He is kept the dressings clean dry and intact as instructed.  He has also been minimal weightbearing in the cam boot as instructed.  No new complaints at this time  Past Medical History:  Diagnosis Date  . Abnormal lung function test    a. Reported to possibly be COPD, but per pulm note: "Possible COPD - PFT was more suggestive of restrictive defect and diffusion defect likely from obesity and CHF"  . Chronic diastolic heart failure (HCC) 05/07/2018  . CKD (chronic kidney disease), stage III   . Diabetes mellitus type 2 in obese (HCC)   . Elevated troponin 05/07/2018  . Hearing impaired   . Hyperlipidemia   . Hypertension   . LBBB (left bundle branch block)   . LVH (left ventricular hypertrophy)   . Mild aortic stenosis   . Mild pulmonary hypertension (HCC)   . Morbid obesity (HCC)   . Sleep apnea       Objective/Physical Exam Neurovascular status intact.  Skin incisions appear to be well coapted with staples intact. No sign of infectious process noted. No dehiscence. No active bleeding noted. Moderate edema noted to the surgical extremity.  Radiographic Exam:  Implant hardware is stable and intact to the first MTPJ.  Orthopedic hardware and osteotomies sites appear to be stable with routine healing.  Assessment: 1. s/p right great toe arthroplasty with implant. DOS: 04/11/2020   Plan of Care:  1. Patient was evaluated. X-rays reviewed 2.  Dressings changed today. 3.  Patient may begin washing the foot.  Recommend antibiotic ointment and light dressing daily 4.  Recommend Ace wrap daily.  Ace wraps provided 5.  Patient states the cam boot is very uncomfortable.  Discontinue cam boot.  Postsurgical shoe dispensed.  Weightbearing as tolerated 6.  Return to clinic in 1 week for possible staple  removal    Felecia Shelling, DPM [facility]  Dr. Felecia Shelling, DPM    [address]                                        [place], [place] [address]                Office [phone]  Fax [phone]

## 2020-04-22 ENCOUNTER — Telehealth: Payer: Self-pay | Admitting: Podiatry

## 2020-04-22 NOTE — Telephone Encounter (Signed)
Pt wanted to know if it is OK for him to drive without the boot on, only wearing crocs. He wants to drive.

## 2020-04-24 ENCOUNTER — Encounter: Payer: No Typology Code available for payment source | Admitting: Podiatry

## 2020-04-29 ENCOUNTER — Ambulatory Visit (INDEPENDENT_AMBULATORY_CARE_PROVIDER_SITE_OTHER): Payer: Medicare Other | Admitting: Podiatry

## 2020-04-29 ENCOUNTER — Encounter: Payer: Medicare Other | Admitting: Podiatry

## 2020-04-29 ENCOUNTER — Other Ambulatory Visit: Payer: Self-pay

## 2020-04-29 DIAGNOSIS — M205X1 Other deformities of toe(s) (acquired), right foot: Secondary | ICD-10-CM

## 2020-04-29 DIAGNOSIS — Z9889 Other specified postprocedural states: Secondary | ICD-10-CM

## 2020-04-30 NOTE — Progress Notes (Signed)
  Subjective:  Patient ID: Mark [name], male    DOB: 1961-06-28,  MRN: [id number]    DOS: 04/11/2020 Procedure: Right foot first metatarsophalangeal joint arthroplasty with implant  59 y.o. male returns for post-op check.  Doing well, having some some pain.  Here today for staple removal  Review of Systems: Negative except as noted in the HPI. Denies N/V/F/Ch.   Objective:  There were no vitals filed for this visit. There is no height or weight on file to calculate BMI. Constitutional Well developed. Well nourished.  Vascular Foot warm and well perfused. Capillary refill normal to all digits.   Neurologic Normal speech. Oriented to person, place, and time. Epicritic sensation to light touch grossly present bilaterally.  Dermatologic Skin healing well without signs of infection. Skin edges well coapted without signs of infection.  Orthopedic: Tenderness to palpation noted about the surgical site.   Assessment:  No diagnosis found. Plan:  Patient was evaluated and treated and all questions answered.  S/p foot surgery right -Progressing as expected post-operatively. -WB Status: WBAT in surgical shoe -Sutures: Staples removed today, fresh Steri-Strips applied.  He may begin bathing, do not soak or scrub the incision. -Foot redressed with an Ace wrap  Return in about 3 weeks (around 05/20/2020) for With Dr. Logan Bores.

## 2020-05-08 ENCOUNTER — Encounter: Payer: No Typology Code available for payment source | Admitting: Podiatry

## 2020-05-20 ENCOUNTER — Other Ambulatory Visit: Payer: Self-pay

## 2020-05-20 ENCOUNTER — Ambulatory Visit (INDEPENDENT_AMBULATORY_CARE_PROVIDER_SITE_OTHER): Payer: Medicare Other | Admitting: Podiatry

## 2020-05-20 ENCOUNTER — Ambulatory Visit (INDEPENDENT_AMBULATORY_CARE_PROVIDER_SITE_OTHER): Payer: No Typology Code available for payment source

## 2020-05-20 DIAGNOSIS — M205X1 Other deformities of toe(s) (acquired), right foot: Secondary | ICD-10-CM | POA: Diagnosis not present

## 2020-05-20 DIAGNOSIS — M19071 Primary osteoarthritis, right ankle and foot: Secondary | ICD-10-CM

## 2020-05-20 DIAGNOSIS — Z9889 Other specified postprocedural states: Secondary | ICD-10-CM

## 2020-05-20 NOTE — Progress Notes (Signed)
   Subjective:  Patient presents today status post right great toe arthroplasty with implant. DOS: 04/11/2020.  Patient is doing well.  He states he has been wearing sandals and shoes with minimal tenderness.  He does state that he still has some residual swelling to the area.  No new complaints at this time  Past Medical History:  Diagnosis Date  . Abnormal lung function test    a. Reported to possibly be COPD, but per pulm note: "Possible COPD - PFT was more suggestive of restrictive defect and diffusion defect likely from obesity and CHF"  . Chronic diastolic heart failure (HCC) 05/07/2018  . CKD (chronic kidney disease), stage III   . Diabetes mellitus type 2 in obese (HCC)   . Elevated troponin 05/07/2018  . Hearing impaired   . Hyperlipidemia   . Hypertension   . LBBB (left bundle branch block)   . LVH (left ventricular hypertrophy)   . Mild aortic stenosis   . Mild pulmonary hypertension (HCC)   . Morbid obesity (HCC)   . Sleep apnea       Objective/Physical Exam Neurovascular status intact.  Skin incisions appear to be well coapted and healed. No sign of infectious process noted. No dehiscence. No active bleeding noted. Moderate edema noted to the surgical extremity.  Radiographic Exam:  Implant hardware is stable and intact to the first MTPJ.  Orthopedic hardware and osteotomies sites appear to be stable with routine healing.  Assessment: 1. s/p right great toe arthroplasty with implant. DOS: 04/11/2020   Plan of Care:  1. Patient was evaluated. X-rays reviewed 2.  Patient may now resume full activity no restrictions 3.  Recommend daily range of motion exercises to the great toe joint 4.  Recommend good supportive shoes 5.  Compression anklet dispensed.  Wear daily  6.  Return to clinic as needed  Felecia Shelling, DPM [facility]  Dr. Felecia Shelling, DPM    [address]                                        [place], [place] [address]                  Office [phone]  Fax [phone]

## 2020-05-21 ENCOUNTER — Other Ambulatory Visit: Payer: Self-pay | Admitting: Nurse Practitioner

## 2020-05-24 ENCOUNTER — Other Ambulatory Visit: Payer: Self-pay | Admitting: Nurse Practitioner

## 2020-05-24 DIAGNOSIS — I5032 Chronic diastolic (congestive) heart failure: Secondary | ICD-10-CM

## 2020-05-28 ENCOUNTER — Other Ambulatory Visit: Payer: Self-pay | Admitting: Nurse Practitioner

## 2020-06-10 ENCOUNTER — Ambulatory Visit (INDEPENDENT_AMBULATORY_CARE_PROVIDER_SITE_OTHER): Payer: No Typology Code available for payment source | Admitting: Nurse Practitioner

## 2020-06-10 ENCOUNTER — Other Ambulatory Visit: Payer: Self-pay

## 2020-06-10 ENCOUNTER — Encounter: Payer: Self-pay | Admitting: Nurse Practitioner

## 2020-06-10 VITALS — BP 134/82 | HR 84 | Temp 98.3°F | Ht 68.4 in | Wt 252.0 lb

## 2020-06-10 DIAGNOSIS — I10 Essential (primary) hypertension: Secondary | ICD-10-CM | POA: Diagnosis not present

## 2020-06-10 DIAGNOSIS — E1159 Type 2 diabetes mellitus with other circulatory complications: Secondary | ICD-10-CM

## 2020-06-10 DIAGNOSIS — Z6837 Body mass index (BMI) 37.0-37.9, adult: Secondary | ICD-10-CM | POA: Diagnosis not present

## 2020-06-10 DIAGNOSIS — I739 Peripheral vascular disease, unspecified: Secondary | ICD-10-CM | POA: Diagnosis not present

## 2020-06-10 DIAGNOSIS — Z1211 Encounter for screening for malignant neoplasm of colon: Secondary | ICD-10-CM

## 2020-06-10 NOTE — Progress Notes (Signed)
Rutherford Nail as a scribe for [name], FNP.,have documented all relevant documentation on the behalf of [name], FNP,as directed by  [name], FNP while in the presence of [name], [place]. This visit occurred during the SARS-CoV-2 public health emergency.  Safety protocols were in place, including screening questions prior to the visit, additional usage of staff PPE, and extensive cleaning of exam room while observing appropriate contact time as indicated for disinfecting solutions.  Subjective:     Patient ID: Mark [name] , male    DOB: Jan 10, 1961 , 59 y.o.   MRN: [id number]   Chief Complaint  Patient presents with  . Diabetes    HPI  Wt Readings from Last 3 Encounters: 06/10/20 : 252 lb (114.3 kg) 03/07/20 : 251 lb (113.9 kg) 03/07/20 : 251 lb 12.8 oz (114.2 kg)  He had his surgery to his foot since his last office visit.   Diabetes He presents for his follow-up diabetic visit. He has type 2 diabetes mellitus. There are no hypoglycemic associated symptoms. Pertinent negatives for hypoglycemia include no dizziness or headaches. There are no diabetic associated symptoms. Pertinent negatives for diabetes include no chest pain, no fatigue, no polydipsia, no polyphagia and no polyuria. There are no hypoglycemic complications. There are no diabetic complications. Risk factors for coronary artery disease include diabetes mellitus, hypertension, sedentary lifestyle, obesity and male sex. He is compliant with treatment most of the time. His weight is stable. He is following a diabetic diet. He has not had a previous visit with a dietitian. He rarely participates in exercise. There is no change in his home blood glucose trend. (Blood sugar was 90 the last time he checked it) An ACE inhibitor/angiotensin II receptor blocker is being taken. Eye exam is not current.  Hypertension This is a chronic problem. The current episode started more than 1 year ago. The problem is  unchanged. The problem is controlled. Pertinent negatives include no anxiety, chest pain, headaches or palpitations. Past treatments include angiotensin blockers and lifestyle changes. There are no compliance problems.  There is no history of angina. There is no history of chronic renal disease.     Past Medical History:  Diagnosis Date  . Abnormal lung function test    a. Reported to possibly be COPD, but per pulm note: "Possible COPD - PFT was more suggestive of restrictive defect and diffusion defect likely from obesity and CHF"  . Chronic diastolic heart failure ([place]) 05/07/2018  . CKD (chronic kidney disease), stage III ([place])   . Diabetes mellitus type 2 in obese ([place])   . Elevated troponin 05/07/2018  . Hearing impaired   . Hyperlipidemia   . Hypertension   . LBBB (left bundle branch block)   . LVH (left ventricular hypertrophy)   . Mild aortic stenosis   . Mild pulmonary hypertension ([place])   . Morbid obesity ([place])   . Sleep apnea      Family History  Problem Relation Age of Onset  . Hypertension Mother   . Hypertension Father      Current Outpatient Medications:  .  aspirin EC 81 MG EC tablet, Take 1 tablet (81 mg total) by mouth daily., Disp: 30 tablet, Rfl: 0 .  atorvastatin (LIPITOR) 20 MG tablet, TAKE 1 TABLET BY MOUTH EVERY DAY, Disp: 90 tablet, Rfl: 0 .  carvedilol (COREG) 6.25 MG tablet, TAKE 1 TABLET BY MOUTH TWICE A DAY WITH FOOD, Disp: 180 tablet, Rfl: 1 .  colchicine 0.6 MG tablet,  Take 1 tablet (0.6 mg total) by mouth daily., Disp: 5 tablet, Rfl: 1 .  gabapentin (NEURONTIN) 100 MG capsule, TAKE 1 CAPSULE (100 MG TOTAL) BY MOUTH AT BEDTIME., Disp: 90 capsule, Rfl: 2 .  ipratropium-albuterol (DUONEB) 0.5-2.5 (3) MG/3ML SOLN, Take 3 mLs by nebulization every 6 (six) hours as needed., Disp: , Rfl:  .  losartan (COZAAR) 25 MG tablet, TAKE 1 TABLET BY MOUTH EVERY DAY, Disp: 90 tablet, Rfl: 0 .  meloxicam (MOBIC) 7.5 MG tablet, Take 7.5 mg by mouth daily., Disp: , Rfl:   .  metFORMIN (GLUCOPHAGE) 500 MG tablet, TAKE 1 TABLET BY MOUTH TWICE A DAY WITH BREAKFAST AND DINNER, Disp: 180 tablet, Rfl: 1 .  oxyCODONE-acetaminophen (PERCOCET) 5-325 MG tablet, Take 1 tablet by mouth every 4 (four) hours as needed for severe pain., Disp: 30 tablet, Rfl: 0 .  torsemide (DEMADEX) 20 MG tablet, TAKE 2 TABLETS BY MOUTH EVERY DAY, Disp: 60 tablet, Rfl: 0   No Known Allergies   Review of Systems  Constitutional: Negative.  Negative for fatigue.  HENT: Negative.   Respiratory: Negative.   Cardiovascular: Negative.  Negative for chest pain, palpitations and leg swelling.  Gastrointestinal: Negative.   Endocrine: Negative for polydipsia, polyphagia and polyuria.  Musculoskeletal: Negative.   Skin: Negative.   Neurological: Negative for dizziness and headaches.  Psychiatric/Behavioral: Negative.      Today's Vitals   06/10/20 1032  BP: 134/82  Pulse: 84  Temp: 98.3 F (36.8 C)  TempSrc: Oral  Weight: 252 lb (114.3 kg)  Height: 5' 8.4" (1.737 m)  PainSc: 0-No pain   Body mass index is 37.87 kg/m.   Objective:  Physical Exam Vitals reviewed.  Constitutional:      Appearance: Normal appearance.  Cardiovascular:     Rate and Rhythm: Normal rate and regular rhythm.     Pulses: Normal pulses.     Heart sounds: Normal heart sounds. No murmur heard.   Pulmonary:     Effort: Pulmonary effort is normal. No respiratory distress.     Breath sounds: Normal breath sounds.  Skin:    Capillary Refill: Capillary refill takes less than 2 seconds.  Neurological:     General: No focal deficit present.     Mental Status: He is alert and oriented to person, place, and time.     Cranial Nerves: No cranial nerve deficit.  Psychiatric:        Mood and Affect: Mood normal.        Behavior: Behavior normal.        Thought Content: Thought content normal.        Judgment: Judgment normal.         Assessment And Plan:     1. Type 2 diabetes mellitus with other  circulatory complication, without long-term current use of insulin (HCC)  Chronic, stable  Continue with current medications, tolera  Encouraged to limit intake of sugary foods and drinks  Encouraged to increase physical activity to 150 minutes per week - Hemoglobin A1c - CMP14+EGFR  2. Essential hypertension  Chronic, fairly controlled  Continue with current medications - CMP14+EGFR  3. PAD (peripheral artery disease) (HCC)  Chronic, stable  4. BMI 37.0-37.9, adult  Chronic, he is maintaining his weight  He is to continue eating a healthy and exercise regularly at least 150 minutes per week  5. Encounter for screening colonoscopy  According to USPTF Colorectal cancer Screening guidelines. Colonoscopy is recommended every 10 years, starting at age 64years.  Will refer to GI for colon cancer screening. - Ambulatory referral to Gastroenterology     Patient was given opportunity to ask questions. Patient verbalized understanding of the plan and was able to repeat key elements of the plan. All questions were answered to their satisfaction.   Teola Bradley, FNP, have reviewed all documentation for this visit. The documentation on 06/10/20 for the exam, diagnosis, procedures, and orders are all accurate and complete.  THE PATIENT IS ENCOURAGED TO PRACTICE SOCIAL DISTANCING DUE TO THE COVID-19 PANDEMIC.

## 2020-06-10 NOTE — Patient Instructions (Signed)
Hypertension, Adult Hypertension is another name for high blood pressure. High blood pressure forces your heart to work harder to pump blood. This can cause problems over time. There are two numbers in a blood pressure reading. There is a top number (systolic) over a bottom number (diastolic). It is best to have a blood pressure that is below 120/80. Healthy choices can help lower your blood pressure, or you may need medicine to help lower it. What are the causes? The cause of this condition is not known. Some conditions may be related to high blood pressure. What increases the risk?  Smoking.  Having type 2 diabetes mellitus, high cholesterol, or both.  Not getting enough exercise or physical activity.  Being overweight.  Having too much fat, sugar, calories, or salt (sodium) in your diet.  Drinking too much alcohol.  Having long-term (chronic) kidney disease.  Having a family history of high blood pressure.  Age. Risk increases with age.  Race. You may be at higher risk if you are African American.  Gender. Men are at higher risk than women before age 45. After age 65, women are at higher risk than men.  Having obstructive sleep apnea.  Stress. What are the signs or symptoms?  High blood pressure may not cause symptoms. Very high blood pressure (hypertensive crisis) may cause: ? Headache. ? Feelings of worry or nervousness (anxiety). ? Shortness of breath. ? Nosebleed. ? A feeling of being sick to your stomach (nausea). ? Throwing up (vomiting). ? Changes in how you see. ? Very bad chest pain. ? Seizures. How is this treated?  This condition is treated by making healthy lifestyle changes, such as: ? Eating healthy foods. ? Exercising more. ? Drinking less alcohol.  Your health care provider may prescribe medicine if lifestyle changes are not enough to get your blood pressure under control, and if: ? Your top number is above 130. ? Your bottom number is above  80.  Your personal target blood pressure may vary. Follow these instructions at home: Eating and drinking   If told, follow the DASH eating plan. To follow this plan: ? Fill one half of your plate at each meal with fruits and vegetables. ? Fill one fourth of your plate at each meal with whole grains. Whole grains include whole-wheat pasta, brown rice, and whole-grain bread. ? Eat or drink low-fat dairy products, such as skim milk or low-fat yogurt. ? Fill one fourth of your plate at each meal with low-fat (lean) proteins. Low-fat proteins include fish, chicken without skin, eggs, beans, and tofu. ? Avoid fatty meat, cured and processed meat, or chicken with skin. ? Avoid pre-made or processed food.  Eat less than 1,500 mg of salt each day.  Do not drink alcohol if: ? Your doctor tells you not to drink. ? You are pregnant, may be pregnant, or are planning to become pregnant.  If you drink alcohol: ? Limit how much you use to:  0-1 drink a day for women.  0-2 drinks a day for men. ? Be aware of how much alcohol is in your drink. In the [place] one drink equals one 12 oz bottle of beer (355 mL), one 5 oz glass of wine (148 mL), or one 1 oz glass of hard liquor (44 mL). Lifestyle   Work with your doctor to stay at a healthy weight or to lose weight. Ask your doctor what the best weight is for you.  Get at least 30 minutes of exercise most   days of the week. This may include walking, swimming, or biking.  Get at least 30 minutes of exercise that strengthens your muscles (resistance exercise) at least 3 days a week. This may include lifting weights or doing Pilates.  Do not use any products that contain nicotine or tobacco, such as cigarettes, e-cigarettes, and chewing tobacco. If you need help quitting, ask your doctor.  Check your blood pressure at home as told by your doctor.  Keep all follow-up visits as told by your doctor. This is important. Medicines  Take over-the-counter  and prescription medicines only as told by your doctor. Follow directions carefully.  Do not skip doses of blood pressure medicine. The medicine does not work as well if you skip doses. Skipping doses also puts you at risk for problems.  Ask your doctor about side effects or reactions to medicines that you should watch for. Contact a doctor if you:  Think you are having a reaction to the medicine you are taking.  Have headaches that keep coming back (recurring).  Feel dizzy.  Have swelling in your ankles.  Have trouble with your vision. Get help right away if you:  Get a very bad headache.  Start to feel mixed up (confused).  Feel weak or numb.  Feel faint.  Have very bad pain in your: ? Chest. ? Belly (abdomen).  Throw up more than once.  Have trouble breathing. Summary  Hypertension is another name for high blood pressure.  High blood pressure forces your heart to work harder to pump blood.  For most people, a normal blood pressure is less than 120/80.  Making healthy choices can help lower blood pressure. If your blood pressure does not get lower with healthy choices, you may need to take medicine. This information is not intended to replace advice given to you by your health care provider. Make sure you discuss any questions you have with your health care provider. Document Revised: 04/27/2018 Document Reviewed: 04/27/2018 Elsevier Patient Education  2020 [employer] Diabetes Mellitus and Exercise Exercising regularly is important for your overall health, especially when you have diabetes (diabetes mellitus). Exercising is not only about losing weight. It has many other health benefits, such as increasing muscle strength and bone density and reducing body fat and stress. This leads to improved fitness, flexibility, and endurance, all of which result in better overall health. Exercise has additional benefits for people with diabetes, including:  Reducing  appetite.  Helping to lower and control blood glucose.  Lowering blood pressure.  Helping to control amounts of fatty substances (lipids) in the blood, such as cholesterol and triglycerides.  Helping the body to respond better to insulin (improving insulin sensitivity).  Reducing how much insulin the body needs.  Decreasing the risk for heart disease by: ? Lowering cholesterol and triglyceride levels. ? Increasing the levels of good cholesterol. ? Lowering blood glucose levels. What is my activity plan? Your health care provider or certified diabetes educator can help you make a plan for the type and frequency of exercise (activity plan) that works for you. Make sure that you:  Do at least 150 minutes of moderate-intensity or vigorous-intensity exercise each week. This could be brisk walking, biking, or water aerobics. ? Do stretching and strength exercises, such as yoga or weightlifting, at least 2 times a week. ? Spread out your activity over at least 3 days of the week.  Get some form of physical activity every day. ? Do not go more than 2 days   in a row without some kind of physical activity. ? Avoid being inactive for more than 30 minutes at a time. Take frequent breaks to walk or stretch.  Choose a type of exercise or activity that you enjoy, and set realistic goals.  Start slowly, and gradually increase the intensity of your exercise over time. What do I need to know about managing my diabetes?   Check your blood glucose before and after exercising. ? If your blood glucose is 240 mg/dL (13.3 mmol/L) or higher before you exercise, check your urine for ketones. If you have ketones in your urine, do not exercise until your blood glucose returns to normal. ? If your blood glucose is 100 mg/dL (5.6 mmol/L) or lower, eat a snack containing 15-20 grams of carbohydrate. Check your blood glucose 15 minutes after the snack to make sure that your level is above 100 mg/dL (5.6 mmol/L)  before you start your exercise.  Know the symptoms of low blood glucose (hypoglycemia) and how to treat it. Your risk for hypoglycemia increases during and after exercise. Common symptoms of hypoglycemia can include: ? Hunger. ? Anxiety. ? Sweating and feeling clammy. ? Confusion. ? Dizziness or feeling light-headed. ? Increased heart rate or palpitations. ? Blurry vision. ? Tingling or numbness around the mouth, lips, or tongue. ? Tremors or shakes. ? Irritability.  Keep a rapid-acting carbohydrate snack available before, during, and after exercise to help prevent or treat hypoglycemia.  Avoid injecting insulin into areas of the body that are going to be exercised. For example, avoid injecting insulin into: ? The arms, when playing tennis. ? The legs, when jogging.  Keep records of your exercise habits. Doing this can help you and your health care provider adjust your diabetes management plan as needed. Write down: ? Food that you eat before and after you exercise. ? Blood glucose levels before and after you exercise. ? The type and amount of exercise you have done. ? When your insulin is expected to peak, if you use insulin. Avoid exercising at times when your insulin is peaking.  When you start a new exercise or activity, work with your health care provider to make sure the activity is safe for you, and to adjust your insulin, medicines, or food intake as needed.  Drink plenty of water while you exercise to prevent dehydration or heat stroke. Drink enough fluid to keep your urine clear or pale yellow. Summary  Exercising regularly is important for your overall health, especially when you have diabetes (diabetes mellitus).  Exercising has many health benefits, such as increasing muscle strength and bone density and reducing body fat and stress.  Your health care provider or certified diabetes educator can help you make a plan for the type and frequency of exercise (activity plan)  that works for you.  When you start a new exercise or activity, work with your health care provider to make sure the activity is safe for you, and to adjust your insulin, medicines, or food intake as needed. This information is not intended to replace advice given to you by your health care provider. Make sure you discuss any questions you have with your health care provider. Document Revised: 03/11/2017 Document Reviewed: 01/27/2016 Elsevier Patient Education  2020 Elsevier Inc.  

## 2020-06-11 LAB — CMP14+EGFR
ALT: 14 IU/L (ref 0–44)
AST: 12 IU/L (ref 0–40)
Albumin/Globulin Ratio: 1.7 (ref 1.2–2.2)
Albumin: 4.6 g/dL (ref 3.8–4.8)
Alkaline Phosphatase: 110 IU/L (ref 44–121)
BUN/Creatinine Ratio: 13 (ref 10–24)
BUN: 19 mg/dL (ref 8–27)
Bilirubin Total: 0.5 mg/dL (ref 0.0–1.2)
CO2: 29 mmol/L (ref 20–29)
Calcium: 9.8 mg/dL (ref 8.6–10.2)
Chloride: 100 mmol/L (ref 96–106)
Creatinine, Ser: 1.49 mg/dL — ABNORMAL HIGH (ref 0.76–1.27)
GFR calc Af Amer: 58 mL/min/{1.73_m2} — ABNORMAL LOW (ref >59)
GFR calc non Af Amer: 50 mL/min/{1.73_m2} — ABNORMAL LOW (ref >59)
Globulin, Total: 2.7 g/dL (ref 1.5–4.5)
Glucose: 107 mg/dL — ABNORMAL HIGH (ref 65–99)
Potassium: 4.6 mmol/L (ref 3.5–5.2)
Sodium: 142 mmol/L (ref 134–144)
Total Protein: 7.3 g/dL (ref 6.0–8.5)

## 2020-06-11 LAB — LIPID PANEL
Chol/HDL Ratio: 2.8 ratio (ref 0.0–5.0)
Cholesterol, Total: 182 mg/dL (ref 100–199)
HDL: 66 mg/dL (ref >39)
LDL Chol Calc (NIH): 102 mg/dL — ABNORMAL HIGH (ref 0–99)
Triglycerides: 77 mg/dL (ref 0–149)
VLDL Cholesterol Cal: 14 mg/dL (ref 5–40)

## 2020-06-11 LAB — HEMOGLOBIN A1C
Est. average glucose Bld gHb Est-mCnc: 126 mg/dL
Hgb A1c MFr Bld: 6 % — ABNORMAL HIGH (ref 4.8–5.6)

## 2020-06-18 ENCOUNTER — Other Ambulatory Visit: Payer: Self-pay | Admitting: Nurse Practitioner

## 2020-06-18 DIAGNOSIS — I5032 Chronic diastolic (congestive) heart failure: Secondary | ICD-10-CM

## 2020-06-24 ENCOUNTER — Ambulatory Visit (INDEPENDENT_AMBULATORY_CARE_PROVIDER_SITE_OTHER): Payer: Medicare Other | Admitting: Otolaryngology

## 2020-06-26 ENCOUNTER — Other Ambulatory Visit: Payer: Self-pay | Admitting: Nurse Practitioner

## 2020-07-11 ENCOUNTER — Other Ambulatory Visit: Payer: Self-pay | Admitting: Nurse Practitioner

## 2020-07-11 DIAGNOSIS — I5032 Chronic diastolic (congestive) heart failure: Secondary | ICD-10-CM

## 2020-08-03 ENCOUNTER — Other Ambulatory Visit: Payer: Self-pay | Admitting: Nurse Practitioner

## 2020-08-03 DIAGNOSIS — I5032 Chronic diastolic (congestive) heart failure: Secondary | ICD-10-CM

## 2020-08-12 ENCOUNTER — Other Ambulatory Visit: Payer: Self-pay | Admitting: Nurse Practitioner

## 2020-08-27 ENCOUNTER — Other Ambulatory Visit: Payer: Self-pay | Admitting: Nurse Practitioner

## 2020-08-27 DIAGNOSIS — I5032 Chronic diastolic (congestive) heart failure: Secondary | ICD-10-CM

## 2020-09-17 ENCOUNTER — Other Ambulatory Visit: Payer: Self-pay | Admitting: Nurse Practitioner

## 2020-09-21 ENCOUNTER — Other Ambulatory Visit: Payer: Self-pay | Admitting: Nurse Practitioner

## 2020-09-21 DIAGNOSIS — I5032 Chronic diastolic (congestive) heart failure: Secondary | ICD-10-CM

## 2020-09-23 ENCOUNTER — Other Ambulatory Visit: Payer: Self-pay

## 2020-09-23 ENCOUNTER — Encounter: Payer: Self-pay | Admitting: Nurse Practitioner

## 2020-09-23 ENCOUNTER — Ambulatory Visit (INDEPENDENT_AMBULATORY_CARE_PROVIDER_SITE_OTHER): Payer: Medicare HMO | Admitting: Nurse Practitioner

## 2020-09-23 VITALS — BP 128/88 | HR 65 | Temp 98.4°F | Ht 68.8 in | Wt 259.4 lb

## 2020-09-23 DIAGNOSIS — I5032 Chronic diastolic (congestive) heart failure: Secondary | ICD-10-CM

## 2020-09-23 DIAGNOSIS — E1159 Type 2 diabetes mellitus with other circulatory complications: Secondary | ICD-10-CM | POA: Diagnosis not present

## 2020-09-23 DIAGNOSIS — E782 Mixed hyperlipidemia: Secondary | ICD-10-CM

## 2020-09-23 DIAGNOSIS — Z6838 Body mass index (BMI) 38.0-38.9, adult: Secondary | ICD-10-CM

## 2020-09-23 DIAGNOSIS — I739 Peripheral vascular disease, unspecified: Secondary | ICD-10-CM

## 2020-09-23 DIAGNOSIS — I10 Essential (primary) hypertension: Secondary | ICD-10-CM | POA: Diagnosis not present

## 2020-09-23 MED ORDER — TORSEMIDE 20 MG PO TABS: 40.0000 mg | ORAL_TABLET | Freq: Every day | ORAL | 1 refills | Status: DC

## 2020-09-23 NOTE — Progress Notes (Signed)
I,Yamilka Roman [employer] as a Education administrator for [employer], FNP.,have documented all relevant documentation on the behalf of [name], FNP,as directed by  [name], FNP while in the presence of [name], [place]. This visit occurred during the SARS-CoV-2 public health emergency.  Safety protocols were in place, including screening questions prior to the visit, additional usage of staff PPE, and extensive cleaning of exam room while observing appropriate contact time as indicated for disinfecting solutions.  Subjective:     Patient ID: Mark [name] , male    DOB: 04-19-1961 , 60 y.o.   MRN: [id number]   Chief Complaint  Patient presents with  . Hypertension  . Diabetes    HPI  Patient Is here for a f/u on his diabetes and blood pressure.  Wt Readings from Last 3 Encounters: 09/23/20 : 259 lb 6.4 oz (117.7 kg) 06/10/20 : 252 lb (114.3 kg) 03/07/20 : 251 lb (113.9 kg) Diabetes He presents for his follow-up diabetic visit. He has type 2 diabetes mellitus. There are no hypoglycemic associated symptoms. Pertinent negatives for hypoglycemia include no dizziness or headaches. There are no diabetic associated symptoms. Pertinent negatives for diabetes include no chest pain, no fatigue, no polydipsia, no polyphagia and no polyuria. There are no hypoglycemic complications. There are no diabetic complications. Risk factors for coronary artery disease include diabetes mellitus, hypertension, sedentary lifestyle, obesity and male sex. He is compliant with treatment most of the time. His weight is stable. He is following a diabetic diet. He has not had a previous visit with a dietitian. He rarely participates in exercise. There is no change in his home blood glucose trend. (Blood sugar was 80 this morning) An ACE inhibitor/angiotensin II receptor blocker is being taken. He does not see a podiatrist.Eye exam is not current.  Hypertension This is a chronic problem. The current episode started  more than 1 year ago. The problem is unchanged. The problem is controlled. Pertinent negatives include no anxiety, chest pain, headaches or palpitations. Past treatments include angiotensin blockers and lifestyle changes. There are no compliance problems.  There is no history of angina. There is no history of chronic renal disease.     Past Medical History:  Diagnosis Date  . Abnormal lung function test    a. Reported to possibly be COPD, but per pulm note: "Possible COPD - PFT was more suggestive of restrictive defect and diffusion defect likely from obesity and CHF"  . Chronic diastolic heart failure ([place]) 05/07/2018  . CKD (chronic kidney disease), stage III ([place])   . Diabetes mellitus type 2 in obese ([place])   . Elevated troponin 05/07/2018  . Hearing impaired   . Hyperlipidemia   . Hypertension   . LBBB (left bundle branch block)   . LVH (left ventricular hypertrophy)   . Mild aortic stenosis   . Mild pulmonary hypertension ([place])   . Morbid obesity ([place])   . Sleep apnea      Family History  Problem Relation Age of Onset  . Hypertension Mother   . Hypertension Father      Current Outpatient Medications:  .  atorvastatin (LIPITOR) 20 MG tablet, TAKE 1 TABLET BY MOUTH EVERY DAY, Disp: 90 tablet, Rfl: 0 .  carvedilol (COREG) 6.25 MG tablet, TAKE 1 TABLET BY MOUTH TWICE A DAY WITH FOOD, Disp: 180 tablet, Rfl: 1 .  colchicine 0.6 MG tablet, Take 1 tablet (0.6 mg total) by mouth daily., Disp: 5 tablet, Rfl: 1 .  gabapentin (NEURONTIN) 100 MG  capsule, TAKE 1 CAPSULE (100 MG TOTAL) BY MOUTH AT BEDTIME., Disp: 90 capsule, Rfl: 2 .  ipratropium-albuterol (DUONEB) 0.5-2.5 (3) MG/3ML SOLN, Take 3 mLs by nebulization every 6 (six) hours as needed., Disp: , Rfl:  .  losartan (COZAAR) 25 MG tablet, TAKE 1 TABLET BY MOUTH EVERY DAY, Disp: 90 tablet, Rfl: 0 .  meloxicam (MOBIC) 7.5 MG tablet, Take 7.5 mg by mouth daily., Disp: , Rfl:  .  metFORMIN (GLUCOPHAGE) 500 MG tablet, TAKE 1 TABLET BY MOUTH  TWICE A DAY WITH BREAKFAST AND DINNER, Disp: 180 tablet, Rfl: 1 .  oxyCODONE-acetaminophen (PERCOCET) 5-325 MG tablet, Take 1 tablet by mouth every 4 (four) hours as needed for severe pain., Disp: 30 tablet, Rfl: 0 .  torsemide (DEMADEX) 20 MG tablet, TAKE 2 TABLETS BY MOUTH EVERY DAY, Disp: 60 tablet, Rfl: 0 .  aspirin EC 81 MG EC tablet, Take 1 tablet (81 mg total) by mouth daily. (Patient not taking: Reported on 09/23/2020), Disp: 30 tablet, Rfl: 0   No Known Allergies   Review of Systems  Constitutional: Negative for fatigue.  Respiratory: Negative.   Cardiovascular: Negative for chest pain, palpitations and leg swelling.  Endocrine: Negative for polydipsia, polyphagia and polyuria.  Neurological: Negative for dizziness and headaches.  Psychiatric/Behavioral: Negative.      Today's Vitals   09/23/20 0940  BP: 134/90  Pulse: 65  Temp: 98.4 F (36.9 C)  Weight: 259 lb 6.4 oz (117.7 kg)  Height: 5' 8.8" (1.748 m)  PainSc: 0-No pain   Body mass index is 38.53 kg/m.   Objective:  Physical Exam Vitals reviewed.  Constitutional:      General: He is not in acute distress.    Appearance: Normal appearance. He is obese.  Cardiovascular:     Rate and Rhythm: Normal rate and regular rhythm.     Pulses: Normal pulses.     Heart sounds: Normal heart sounds. No murmur heard.   Pulmonary:     Effort: Pulmonary effort is normal. No respiratory distress.     Breath sounds: Normal breath sounds. No wheezing.  Skin:    Capillary Refill: Capillary refill takes less than 2 seconds.  Neurological:     General: No focal deficit present.     Mental Status: He is alert and oriented to person, place, and time.     Cranial Nerves: No cranial nerve deficit.  Psychiatric:        Mood and Affect: Mood normal.        Behavior: Behavior normal.        Thought Content: Thought content normal.        Judgment: Judgment normal.         Assessment And Plan:     1. Type 2 diabetes mellitus  with other circulatory complication, without long-term current use of insulin (HCC)  Chronic, fair control  Continue with current medications and increasing physical activity - BMP8+eGFR - Hemoglobin A1c - Ambulatory referral to Ophthalmology  2. Essential hypertension  Chronic, slightly elevated   I have encouraged him to stay well hydrated with water  3. Mixed hyperlipidemia  Chronic  No current medications  Encouraged to eat a low fat diet - Lipid panel  4. Chronic diastolic heart failure (HCC)  Stable, he denies going to Cardiologist for follow up  I have given him the number to Cardiology to follow up  5. PAD (peripheral artery disease) (HCC)  Chronic, stable  6. BMI 38.0-38.9,adult Chronic, I have encouraged him  to increase his physical activity and to eat a healthy diet    Patient was given opportunity to ask questions. Patient verbalized understanding of the plan and was able to repeat key elements of the plan. All questions were answered to their satisfaction.  [name], FNP   I, [name], FNP, have reviewed all documentation for this visit. The documentation on 09/23/20 for the exam, diagnosis, procedures, and orders are all accurate and complete.   THE PATIENT IS ENCOURAGED TO PRACTICE SOCIAL DISTANCING DUE TO THE COVID-19 PANDEMIC.

## 2020-09-23 NOTE — Patient Instructions (Signed)

## 2020-09-24 LAB — BMP8+EGFR
BUN/Creatinine Ratio: 11 (ref 10–24)
BUN: 16 mg/dL (ref 8–27)
CO2: 26 mmol/L (ref 20–29)
Calcium: 9.6 mg/dL (ref 8.6–10.2)
Chloride: 102 mmol/L (ref 96–106)
Creatinine, Ser: 1.45 mg/dL — ABNORMAL HIGH (ref 0.76–1.27)
GFR calc Af Amer: 60 mL/min/{1.73_m2} (ref >59)
GFR calc non Af Amer: 52 mL/min/{1.73_m2} — ABNORMAL LOW (ref >59)
Glucose: 104 mg/dL — ABNORMAL HIGH (ref 65–99)
Potassium: 4.4 mmol/L (ref 3.5–5.2)
Sodium: 145 mmol/L — ABNORMAL HIGH (ref 134–144)

## 2020-09-24 LAB — HEMOGLOBIN A1C
Est. average glucose Bld gHb Est-mCnc: 126 mg/dL
Hgb A1c MFr Bld: 6 % — ABNORMAL HIGH (ref 4.8–5.6)

## 2020-09-24 LAB — LIPID PANEL
Chol/HDL Ratio: 3.2 ratio (ref 0.0–5.0)
Cholesterol, Total: 171 mg/dL (ref 100–199)
HDL: 54 mg/dL (ref >39)
LDL Chol Calc (NIH): 98 mg/dL (ref 0–99)
Triglycerides: 104 mg/dL (ref 0–149)
VLDL Cholesterol Cal: 19 mg/dL (ref 5–40)

## 2020-10-03 ENCOUNTER — Other Ambulatory Visit: Payer: Self-pay | Admitting: Nurse Practitioner

## 2020-11-03 ENCOUNTER — Other Ambulatory Visit: Payer: Self-pay | Admitting: Nurse Practitioner

## 2020-11-23 ENCOUNTER — Other Ambulatory Visit: Payer: Self-pay | Admitting: Nurse Practitioner

## 2020-11-25 ENCOUNTER — Other Ambulatory Visit: Payer: Self-pay | Admitting: Nurse Practitioner

## 2020-12-23 ENCOUNTER — Ambulatory Visit (INDEPENDENT_AMBULATORY_CARE_PROVIDER_SITE_OTHER): Payer: No Typology Code available for payment source | Admitting: Nurse Practitioner

## 2020-12-23 ENCOUNTER — Other Ambulatory Visit: Payer: Self-pay

## 2020-12-23 ENCOUNTER — Encounter: Payer: Self-pay | Admitting: Nurse Practitioner

## 2020-12-23 VITALS — BP 140/80 | HR 76 | Temp 98.6°F | Ht 68.2 in | Wt 254.0 lb

## 2020-12-23 DIAGNOSIS — I10 Essential (primary) hypertension: Secondary | ICD-10-CM | POA: Diagnosis not present

## 2020-12-23 DIAGNOSIS — E1159 Type 2 diabetes mellitus with other circulatory complications: Secondary | ICD-10-CM

## 2020-12-23 DIAGNOSIS — E782 Mixed hyperlipidemia: Secondary | ICD-10-CM

## 2020-12-23 DIAGNOSIS — J449 Chronic obstructive pulmonary disease, unspecified: Secondary | ICD-10-CM | POA: Diagnosis not present

## 2020-12-23 MED ORDER — LOSARTAN POTASSIUM 25 MG PO TABS: 25.0000 mg | ORAL_TABLET | Freq: Every day | ORAL | 1 refills | Status: DC

## 2020-12-23 NOTE — Progress Notes (Signed)
This visit occurred during the SARS-CoV-2 public health emergency.  Safety protocols were in place, including screening questions prior to the visit, additional usage of staff PPE, and extensive cleaning of exam room while observing appropriate contact time as indicated for disinfecting solutions.  Subjective:     Patient ID: Mark [name] , male    DOB: [birth year] , 60 y.o.   MRN: [id number]   Chief Complaint  Patient presents with  . Diabetes  . Hypertension    HPI  Mark [name] presents for a hypertension and diabetes follow-up.  His blood pressure today is 140/80. He checks blood pressure at home and the average is 130s/60s.  He denies chest pain, headaches, shortness of breath and blurred vision.  He has been out of Losartan for one week.  His last A1C was 6.0 on 09/23/20.  Fasting glucose readings at home have been in the 80s.  He reports compliance with metformin and denies side effects.   Mark [name] is in need of an opthalmology exam but does not leery due to previous experience with pupil dilation.  Wt Readings from Last 3 Encounters: 12/23/20 : 254 lb (115.2 kg) 09/23/20 : 259 lb 6.4 oz (117.7 kg) 06/10/20 : 252 lb (114.3 kg)     Past Medical History:  Diagnosis Date  . Abnormal lung function test    a. Reported to possibly be COPD, but per pulm note: "Possible COPD - PFT was more suggestive of restrictive defect and diffusion defect likely from obesity and CHF"  . Chronic diastolic heart failure ([place]) 05/07/2018  . CKD (chronic kidney disease), stage III ([place])   . Diabetes mellitus type 2 in obese ([place])   . Elevated troponin 05/07/2018  . Hearing impaired   . Hyperlipidemia   . Hypertension   . LBBB (left bundle branch block)   . LVH (left ventricular hypertrophy)   . Mild aortic stenosis   . Mild pulmonary hypertension ([place])   . Morbid obesity ([place])   . Sleep apnea      Family History  Problem Relation Age of Onset  . Hypertension Mother   . Hypertension  Father      Current Outpatient Medications:  .  atorvastatin (LIPITOR) 20 MG tablet, TAKE 1 TABLET BY MOUTH EVERY DAY, Disp: 90 tablet, Rfl: 0 .  carvedilol (COREG) 6.25 MG tablet, TAKE 1 TABLET BY MOUTH TWICE A DAY WITH FOOD, Disp: 180 tablet, Rfl: 1 .  colchicine 0.6 MG tablet, Take 1 tablet (0.6 mg total) by mouth daily., Disp: 5 tablet, Rfl: 1 .  gabapentin (NEURONTIN) 100 MG capsule, TAKE 1 CAPSULE (100 MG TOTAL) BY MOUTH AT BEDTIME., Disp: 90 capsule, Rfl: 2 .  ipratropium-albuterol (DUONEB) 0.5-2.5 (3) MG/3ML SOLN, Take 3 mLs by nebulization every 6 (six) hours as needed., Disp: , Rfl:  .  meloxicam (MOBIC) 7.5 MG tablet, Take 7.5 mg by mouth daily., Disp: , Rfl:  .  metFORMIN (GLUCOPHAGE) 500 MG tablet, TAKE 1 TABLET BY MOUTH TWICE A DAY WITH BREAKFAST AND DINNER, Disp: 180 tablet, Rfl: 1 .  oxyCODONE-acetaminophen (PERCOCET) 5-325 MG tablet, Take 1 tablet by mouth every 4 (four) hours as needed for severe pain., Disp: 30 tablet, Rfl: 0 .  torsemide (DEMADEX) 20 MG tablet, Take 2 tablets (40 mg total) by mouth daily., Disp: 180 tablet, Rfl: 1 .  aspirin EC 81 MG EC tablet, Take 1 tablet (81 mg total) by mouth daily. (Patient not taking: No sig reported), Disp: 30 tablet, Rfl: 0 .  losartan (COZAAR) 25 MG tablet, Take 1 tablet (25 mg total) by mouth daily., Disp: 90 tablet, Rfl: 1   No Known Allergies   Review of Systems  Constitutional: Negative.   Respiratory: Negative.   Cardiovascular: Negative.   Psychiatric/Behavioral: Negative.      Today's Vitals   12/23/20 1040  BP: 140/80  Pulse: 76  Temp: 98.6 F (37 C)  TempSrc: Oral  Weight: 254 lb (115.2 kg)  Height: 5' 8.2" (1.732 m)  PainSc: 0-No pain   Body mass index is 38.39 kg/m.   Objective:  Physical Exam Cardiovascular:     Rate and Rhythm: Normal rate and regular rhythm.     Pulses: Normal pulses.     Heart sounds: Normal heart sounds.  Pulmonary:     Effort: Pulmonary effort is normal.     Breath sounds:  Normal breath sounds.  Neurological:     Mental Status: He is alert.         Assessment And Plan:     1. Type 2 diabetes mellitus with other circulatory complication, without long-term current use of insulin (HCC)  Chronic, stable  Continue with current medications  Encouraged to limit intake of sugary foods and drinks  Encouraged to increase physical activity to 150 minutes per week  Continue with metformin daily - Lipid panel - CMP14+EGFR - Hemoglobin A1c  2. Essential hypertension . B/P is fairly controlled.  . CMP ordered to check renal function.  . The importance of regular exercise and dietary modification was stressed to the patient.  . Stressed importance of losing ten percent of her body weight to help with B/P control.  - CMP14+EGFR  3. Mixed hyperlipidemia  Chronic, controlled  Continue with current medications, tolerating medications well - Lipid panel     Patient was given opportunity to ask questions. Patient verbalized understanding of the plan and was able to repeat key elements of the plan. All questions were answered to their satisfaction.  [name], FNP   I, [name], FNP, have reviewed all documentation for this visit. The documentation on 01/28/21 for the exam, diagnosis, procedures, and orders are all accurate and complete.   IF YOU HAVE BEEN REFERRED TO A SPECIALIST, IT MAY TAKE 1-2 WEEKS TO SCHEDULE/PROCESS THE REFERRAL. IF YOU HAVE NOT HEARD FROM US/SPECIALIST IN TWO WEEKS, PLEASE GIVE [place] A CALL AT [phone].   THE PATIENT IS ENCOURAGED TO PRACTICE SOCIAL DISTANCING DUE TO THE COVID-19 PANDEMIC.

## 2020-12-23 NOTE — Progress Notes (Deleted)
I,Yamilka [employer] as a Education administrator for [employer], FNP.,have documented all relevant documentation on the behalf of [name], FNP,as directed by  [name], FNP while in the presence of [name], [place]. This visit occurred during the SARS-CoV-2 public health emergency.  Safety protocols were in place, including screening questions prior to the visit, additional usage of staff PPE, and extensive cleaning of exam room while observing appropriate contact time as indicated for disinfecting solutions.  Subjective:     Patient ID: Mark [name] , male    DOB: Jul 22, 1961 , 60 y.o.   MRN: [id number]   Chief Complaint  Patient presents with  . Diabetes  . Hypertension    HPI  Patient Is here for a f/u on his diabetes and blood pressure.  He is exercising by cutting his yard.  He has not been to the Cardiologist in some time, has been having problems getting through  [facility] Readings from Last 3 Encounters: 12/23/20 : 254 lb (115.2 kg) 09/23/20 : 259 lb 6.4 oz (117.7 kg) 06/10/20 : 252 lb (114.3 kg)  Diabetes He presents for his follow-up diabetic visit. He has type 2 diabetes mellitus. There are no hypoglycemic associated symptoms. Pertinent negatives for hypoglycemia include no dizziness or headaches. There are no diabetic associated symptoms. Pertinent negatives for diabetes include no chest pain, no fatigue, no polydipsia, no polyphagia and no polyuria. There are no hypoglycemic complications. There are no diabetic complications. Risk factors for coronary artery disease include diabetes mellitus, hypertension, sedentary lifestyle, obesity and male sex. He is compliant with treatment most of the time. His weight is stable. He is following a diabetic diet. He has not had a previous visit with a dietitian. He rarely participates in exercise. There is no change in his home blood glucose trend. (Blood sugar was 80 this morning) An ACE inhibitor/angiotensin II receptor blocker is being  taken. He does not see a podiatrist.Eye exam is not current.  Hypertension This is a chronic problem. The current episode started more than 1 year ago. The problem is unchanged. The problem is controlled. Pertinent negatives include no anxiety, chest pain, headaches or palpitations. Past treatments include angiotensin blockers and lifestyle changes. There are no compliance problems.  There is no history of angina. There is no history of chronic renal disease.     Past Medical History:  Diagnosis Date  . Abnormal lung function test    a. Reported to possibly be COPD, but per pulm note: "Possible COPD - PFT was more suggestive of restrictive defect and diffusion defect likely from obesity and CHF"  . Chronic diastolic heart failure ([place]) 05/07/2018  . CKD (chronic kidney disease), stage III ([place])   . Diabetes mellitus type 2 in obese ([place])   . Elevated troponin 05/07/2018  . Hearing impaired   . Hyperlipidemia   . Hypertension   . LBBB (left bundle branch block)   . LVH (left ventricular hypertrophy)   . Mild aortic stenosis   . Mild pulmonary hypertension ([place])   . Morbid obesity ([place])   . Sleep apnea      Family History  Problem Relation Age of Onset  . Hypertension Mother   . Hypertension Father      Current Outpatient Medications:  .  atorvastatin (LIPITOR) 20 MG tablet, TAKE 1 TABLET BY MOUTH EVERY DAY, Disp: 90 tablet, Rfl: 0 .  carvedilol (COREG) 6.25 MG tablet, TAKE 1 TABLET BY MOUTH TWICE A DAY WITH FOOD, Disp: 180 tablet, Rfl: 1 .  colchicine 0.6 MG tablet, Take 1 tablet (0.6 mg total) by mouth daily., Disp: 5 tablet, Rfl: 1 .  gabapentin (NEURONTIN) 100 MG capsule, TAKE 1 CAPSULE (100 MG TOTAL) BY MOUTH AT BEDTIME., Disp: 90 capsule, Rfl: 2 .  ipratropium-albuterol (DUONEB) 0.5-2.5 (3) MG/3ML SOLN, Take 3 mLs by nebulization every 6 (six) hours as needed., Disp: , Rfl:  .  meloxicam (MOBIC) 7.5 MG tablet, Take 7.5 mg by mouth daily., Disp: , Rfl:  .  metFORMIN (GLUCOPHAGE)  500 MG tablet, TAKE 1 TABLET BY MOUTH TWICE A DAY WITH BREAKFAST AND DINNER, Disp: 180 tablet, Rfl: 1 .  oxyCODONE-acetaminophen (PERCOCET) 5-325 MG tablet, Take 1 tablet by mouth every 4 (four) hours as needed for severe pain., Disp: 30 tablet, Rfl: 0 .  torsemide (DEMADEX) 20 MG tablet, Take 2 tablets (40 mg total) by mouth daily., Disp: 180 tablet, Rfl: 1 .  aspirin EC 81 MG EC tablet, Take 1 tablet (81 mg total) by mouth daily. (Patient not taking: No sig reported), Disp: 30 tablet, Rfl: 0 .  losartan (COZAAR) 25 MG tablet, Take 1 tablet (25 mg total) by mouth daily., Disp: 90 tablet, Rfl: 1   No Known Allergies   Review of Systems  Constitutional: Negative for fatigue.  Cardiovascular: Negative for chest pain and palpitations.  Endocrine: Negative for polydipsia, polyphagia and polyuria.  Neurological: Negative for dizziness and headaches.     Today's Vitals   12/23/20 1040  BP: 140/80  Pulse: 76  Temp: 98.6 F (37 C)  TempSrc: Oral  Weight: 254 lb (115.2 kg)  Height: 5' 8.2" (1.732 m)  PainSc: 0-No pain   Body mass index is 38.39 kg/m.   Objective:  Physical Exam      Assessment And Plan:     1. Type 2 diabetes mellitus with other circulatory complication, without long-term current use of insulin (HCC) - Lipid panel - CMP14+EGFR - Hemoglobin A1c  2. Essential hypertension - CMP14+EGFR  3. Mixed hyperlipidemia - Lipid panel     Patient was given opportunity to ask questions. Patient verbalized understanding of the plan and was able to repeat key elements of the plan. All questions were answered to their satisfaction.  [name], FNP   I, [name], FNP, have reviewed all documentation for this visit. The documentation on 12/23/20 for the exam, diagnosis, procedures, and orders are all accurate and complete.   IF YOU HAVE BEEN REFERRED TO A SPECIALIST, IT MAY TAKE 1-2 WEEKS TO SCHEDULE/PROCESS THE REFERRAL. IF YOU HAVE NOT HEARD FROM US/SPECIALIST IN TWO  WEEKS, PLEASE GIVE [place] A CALL AT [phone].   THE PATIENT IS ENCOURAGED TO PRACTICE SOCIAL DISTANCING DUE TO THE COVID-19 PANDEMIC.

## 2020-12-23 NOTE — Patient Instructions (Signed)

## 2020-12-24 LAB — CMP14+EGFR
ALT: 13 IU/L (ref 0–44)
AST: 11 IU/L (ref 0–40)
Albumin/Globulin Ratio: 1.8 (ref 1.2–2.2)
Albumin: 4.5 g/dL (ref 3.8–4.8)
Alkaline Phosphatase: 97 IU/L (ref 44–121)
BUN/Creatinine Ratio: 10 (ref 10–24)
BUN: 13 mg/dL (ref 8–27)
Bilirubin Total: 0.6 mg/dL (ref 0.0–1.2)
CO2: 25 mmol/L (ref 20–29)
Calcium: 9.4 mg/dL (ref 8.6–10.2)
Chloride: 104 mmol/L (ref 96–106)
Creatinine, Ser: 1.29 mg/dL — ABNORMAL HIGH (ref 0.76–1.27)
Globulin, Total: 2.5 g/dL (ref 1.5–4.5)
Glucose: 101 mg/dL — ABNORMAL HIGH (ref 65–99)
Potassium: 4.1 mmol/L (ref 3.5–5.2)
Sodium: 144 mmol/L (ref 134–144)
Total Protein: 7 g/dL (ref 6.0–8.5)
eGFR: 63 mL/min/{1.73_m2} (ref >59)

## 2020-12-24 LAB — HEMOGLOBIN A1C
Est. average glucose Bld gHb Est-mCnc: 140 mg/dL
Hgb A1c MFr Bld: 6.5 % — ABNORMAL HIGH (ref 4.8–5.6)

## 2020-12-24 LAB — LIPID PANEL
Chol/HDL Ratio: 3.2 ratio (ref 0.0–5.0)
Cholesterol, Total: 167 mg/dL (ref 100–199)
HDL: 53 mg/dL (ref >39)
LDL Chol Calc (NIH): 98 mg/dL (ref 0–99)
Triglycerides: 86 mg/dL (ref 0–149)
VLDL Cholesterol Cal: 16 mg/dL (ref 5–40)

## 2021-02-12 DIAGNOSIS — R062 Wheezing: Secondary | ICD-10-CM | POA: Diagnosis not present

## 2021-02-12 DIAGNOSIS — I5032 Chronic diastolic (congestive) heart failure: Secondary | ICD-10-CM | POA: Diagnosis not present

## 2021-02-19 ENCOUNTER — Ambulatory Visit: Payer: Medicare HMO | Admitting: Podiatry

## 2021-02-19 ENCOUNTER — Other Ambulatory Visit: Payer: Self-pay | Admitting: Nurse Practitioner

## 2021-02-19 DIAGNOSIS — M109 Gout, unspecified: Secondary | ICD-10-CM | POA: Diagnosis not present

## 2021-02-19 DIAGNOSIS — M19071 Primary osteoarthritis, right ankle and foot: Secondary | ICD-10-CM | POA: Diagnosis not present

## 2021-02-19 DIAGNOSIS — M67371 Transient synovitis, right ankle and foot: Secondary | ICD-10-CM | POA: Diagnosis not present

## 2021-02-21 DIAGNOSIS — M67371 Transient synovitis, right ankle and foot: Secondary | ICD-10-CM | POA: Diagnosis not present

## 2021-03-12 ENCOUNTER — Ambulatory Visit (INDEPENDENT_AMBULATORY_CARE_PROVIDER_SITE_OTHER): Payer: Medicare HMO

## 2021-03-12 ENCOUNTER — Encounter: Payer: Self-pay | Admitting: Nurse Practitioner

## 2021-03-12 ENCOUNTER — Ambulatory Visit (INDEPENDENT_AMBULATORY_CARE_PROVIDER_SITE_OTHER): Payer: Medicare HMO | Admitting: Nurse Practitioner

## 2021-03-12 ENCOUNTER — Other Ambulatory Visit: Payer: Self-pay

## 2021-03-12 VITALS — BP 128/78 | HR 71 | Temp 98.7°F | Ht 68.0 in | Wt 250.6 lb

## 2021-03-12 VITALS — BP 128/78 | HR 71 | Temp 98.7°F | Ht 68.0 in | Wt 250.7 lb

## 2021-03-12 DIAGNOSIS — J42 Unspecified chronic bronchitis: Secondary | ICD-10-CM

## 2021-03-12 DIAGNOSIS — E1165 Type 2 diabetes mellitus with hyperglycemia: Secondary | ICD-10-CM

## 2021-03-12 DIAGNOSIS — I10 Essential (primary) hypertension: Secondary | ICD-10-CM

## 2021-03-12 DIAGNOSIS — Z0000 Encounter for general adult medical examination without abnormal findings: Secondary | ICD-10-CM | POA: Diagnosis not present

## 2021-03-12 DIAGNOSIS — E782 Mixed hyperlipidemia: Secondary | ICD-10-CM | POA: Diagnosis not present

## 2021-03-12 DIAGNOSIS — I739 Peripheral vascular disease, unspecified: Secondary | ICD-10-CM

## 2021-03-12 DIAGNOSIS — E1159 Type 2 diabetes mellitus with other circulatory complications: Secondary | ICD-10-CM | POA: Diagnosis not present

## 2021-03-12 DIAGNOSIS — I272 Pulmonary hypertension, unspecified: Secondary | ICD-10-CM

## 2021-03-12 LAB — POCT URINALYSIS DIPSTICK
Bilirubin, UA: NEGATIVE
Blood, UA: NEGATIVE
Glucose, UA: NEGATIVE
Ketones, UA: NEGATIVE
Leukocytes, UA: NEGATIVE
Nitrite, UA: NEGATIVE
Protein, UA: NEGATIVE
Spec Grav, UA: 1.015 (ref 1.010–1.025)
Urobilinogen, UA: 0.2 E.U./dL
pH, UA: 6 (ref 5.0–8.0)

## 2021-03-12 LAB — POCT UA - MICROALBUMIN
Albumin/Creatinine Ratio, Urine, POC: <30
Creatinine, POC: 100 mg/dL
Microalbumin Ur, POC: 10 mg/L

## 2021-03-12 NOTE — Progress Notes (Signed)
This visit occurred during the SARS-CoV-2 public health emergency.  Safety protocols were in place, including screening questions prior to the visit, additional usage of staff PPE, and extensive cleaning of exam room while observing appropriate contact time as indicated for disinfecting solutions.  Subjective:   Mark [name] is a 60 y.o. male who presents for Medicare Annual/Subsequent preventive examination.  Review of Systems     Cardiac Risk Factors include: advanced age (>60men, >40 women);diabetes mellitus;hypertension;male gender;dyslipidemia;obesity (BMI >30kg/m2);sedentary lifestyle     Objective:    Today's Vitals   03/12/21 1045  BP: 128/78  Pulse: 71  Temp: 98.7 F (37.1 C)  TempSrc: Oral  SpO2: 92%  Weight: 250 lb 9.6 oz (113.7 kg)  Height: 5\' 8"  (1.727 m)   Body mass index is 38.1 kg/m.  Advanced Directives 03/12/2021 03/07/2020 12/27/2018 05/07/2018  Does Patient Have a Medical Advance Directive? No No Yes No  Type of Advance Directive - - Living will -  Would patient like information on creating a medical advance directive? - No - Patient declined - No - Patient declined    Current Medications (verified) Outpatient Encounter Medications as of 03/12/2021  Medication Sig   atorvastatin (LIPITOR) 20 MG tablet TAKE 1 TABLET BY MOUTH EVERY DAY   carvedilol (COREG) 6.25 MG tablet TAKE 1 TABLET BY MOUTH TWICE A DAY WITH FOOD   colchicine 0.6 MG tablet Take 1 tablet (0.6 mg total) by mouth daily.   gabapentin (NEURONTIN) 100 MG capsule TAKE 1 CAPSULE (100 MG TOTAL) BY MOUTH AT BEDTIME.   losartan (COZAAR) 25 MG tablet Take 1 tablet (25 mg total) by mouth daily.   meloxicam (MOBIC) 7.5 MG tablet Take 7.5 mg by mouth daily.   metFORMIN (GLUCOPHAGE) 500 MG tablet TAKE 1 TABLET BY MOUTH TWICE A DAY WITH BREAKFAST AND DINNER   oxyCODONE-acetaminophen (PERCOCET) 5-325 MG tablet Take 1 tablet by mouth every 4 (four) hours as needed for severe pain.   torsemide (DEMADEX) 20  MG tablet Take 2 tablets (40 mg total) by mouth daily.   aspirin EC 81 MG EC tablet Take 1 tablet (81 mg total) by mouth daily. (Patient not taking: No sig reported)   ipratropium-albuterol (DUONEB) 0.5-2.5 (3) MG/3ML SOLN Take 3 mLs by nebulization every 6 (six) hours as needed. (Patient not taking: Reported on 03/12/2021)   No facility-administered encounter medications on file as of 03/12/2021.    Allergies (verified) Patient has no known allergies.   History: Past Medical History:  Diagnosis Date   Abnormal lung function test    a. Reported to possibly be COPD, but per pulm note: "Possible COPD - PFT was more suggestive of restrictive defect and diffusion defect likely from obesity and CHF"   Chronic diastolic heart failure (HCC) 05/07/2018   CKD (chronic kidney disease), stage III (HCC)    Diabetes mellitus type 2 in obese [facility])    Elevated troponin 05/07/2018   Hearing impaired    Hyperlipidemia    Hypertension    LBBB (left bundle branch block)    LVH (left ventricular hypertrophy)    Mild aortic stenosis    Mild pulmonary hypertension (HCC)    Morbid obesity (HCC)    Sleep apnea    Past Surgical History:  Procedure Laterality Date   BACK SURGERY     TRACHEOSTOMY     Family History  Problem Relation Age of Onset   Hypertension Mother    Hypertension Father    Social History   Socioeconomic History  Marital status: Married    Spouse name: Not on file   Number of children: Not on file   Years of education: Not on file   Highest education level: Not on file  Occupational History   Occupation: employed  Tobacco Use   Smoking status: Former    Years: 1.00    Pack years: 0.00    Types: Cigarettes    Quit date: 1999    Years since quitting: 23.5   Smokeless tobacco: Never   Tobacco comments:    1 pack would last one month--08/01/18  Vaping Use   Vaping Use: Never used  Substance and Sexual Activity   Alcohol use: No   Drug use: No   Sexual activity: Not  Currently  Other Topics Concern   Not on file  Social History Narrative   Not on file   Social Determinants of Health   Financial Resource Strain: Low Risk    Difficulty of Paying Living Expenses: Not hard at all  Food Insecurity: No Food Insecurity   Worried About Programme researcher, broadcasting/film/video in the Last Year: Never true   Ran Out of Food in the Last Year: Never true  Transportation Needs: No Transportation Needs   Lack of Transportation (Medical): No   Lack of Transportation (Non-Medical): No  Physical Activity: Inactive   Days of Exercise per Week: 0 days   Minutes of Exercise per Session: 0 min  Stress: No Stress Concern Present   Feeling of Stress : Not at all  Social Connections: Not on file    Tobacco Counseling Counseling given: Not Answered Tobacco comments: 1 pack would last one month--08/01/18   Clinical Intake:  Pre-visit preparation completed: Yes  Pain : No/denies pain     Nutritional Status: BMI > 30  Obese Nutritional Risks: None Diabetes: Yes  How often do you need to have someone help you when you read instructions, pamphlets, or other written materials from your doctor or pharmacy?: 1 - Never What is the last grade level you completed in school?: 12th grade  Diabetic? Yes Nutrition Risk Assessment:  Has the patient had any N/V/D within the last 2 months?  No  Does the patient have any non-healing wounds?  No  Has the patient had any unintentional weight loss or weight gain?  No   Diabetes:  Is the patient diabetic?  Yes  If diabetic, was a CBG obtained today?  No  Did the patient bring in their glucometer from home?  No  How often do you monitor your CBG's? Every other day.   Financial Strains and Diabetes Management:  Are you having any financial strains with the device, your supplies or your medication? No .  Does the patient want to be seen by Chronic Care Management for management of their diabetes?  No  Would the patient like to be referred  to a Nutritionist or for Diabetic Management?  No   Diabetic Exams:  Diabetic Eye Exam: Overdue for diabetic eye exam. Pt has been advised about the importance in completing this exam. Patient advised to call and schedule an eye exam. Diabetic Foot Exam: Overdue, Pt has been advised about the importance in completing this exam. Pt is scheduled for diabetic foot exam on next appointment.   Interpreter Needed?: No  Information entered by :: NAllen LPN   Activities of Daily Living In your present state of health, do you have any difficulty performing the following activities: 03/12/2021  Hearing? Y  Comment wears hearing  aide  Vision? N  Difficulty concentrating or making decisions? N  Walking or climbing stairs? N  Dressing or bathing? N  Doing errands, shopping? N  Preparing Food and eating ? N  Using the Toilet? N  In the past six months, have you accidently leaked urine? N  Do you have problems with loss of bowel control? N  Managing your Medications? N  Managing your Finances? N  Housekeeping or managing your Housekeeping? N  Some recent data might be hidden    Patient Care Team: [name], FNP as PCP - General (General Practice) Quintella Reichert, MD as PCP - Cardiology (Cardiology)  Indicate any recent Medical Services you may have received from other than [facility] providers in the past year (date may be approximate).     Assessment:   This is a routine wellness examination for Keonta.  Hearing/Vision screen Vision Screening - Comments:: No regular eye exams,  Dietary issues and exercise activities discussed: Current Exercise Habits: The patient does not participate in regular exercise at present   Goals Addressed             This Visit's Progress    Patient Stated       03/12/2021, no goals        Depression Screen PHQ 2/9 Scores 03/12/2021 03/07/2020 05/22/2019 02/06/2019 12/27/2018 09/26/2018 06/20/2018  PHQ - 2 Score 0 0 0 0 0 0 0  PHQ- 9 Score - - - - 0 -  -    Fall Risk Fall Risk  03/12/2021 03/07/2020 05/22/2019 02/06/2019 12/27/2018  Falls in the past year? 0 0 0 0 0  Risk for fall due to : Medication side effect Medication side effect - - -  Follow up Falls evaluation completed;Education provided;Falls prevention discussed Falls evaluation completed;Education provided;Falls prevention discussed - - Falls prevention discussed    FALL RISK PREVENTION PERTAINING TO THE HOME:  Any stairs in or around the home? No  If so, are there any without handrails? N/a Home free of loose throw rugs in walkways, pet beds, electrical cords, etc? Yes  Adequate lighting in your home to reduce risk of falls? Yes   ASSISTIVE DEVICES UTILIZED TO PREVENT FALLS:  Life alert? No  Use of a cane, walker or w/c? No  Grab bars in the bathroom? No  Shower chair or bench in shower? No  Elevated toilet seat or a handicapped toilet? No   TIMED UP AND GO:  Was the test performed? No .    Gait steady and fast without use of assistive device  Cognitive Function:     6CIT Screen 03/12/2021 03/07/2020 12/27/2018  What Year? 0 points 0 points 0 points  What month? 0 points 0 points 0 points  What time? 0 points 0 points 0 points  Count back from 20 2 points 0 points 0 points  Months in reverse 4 points 4 points 4 points  Repeat phrase 0 points 0 points 2 points  Total Score 6 4 6     Immunizations Immunization History  Administered Date(s) Administered   PFIZER(Purple Top)SARS-COV-2 Vaccination 02/01/2020, 02/22/2020, 08/20/2020    TDAP status: Up to date  Flu Vaccine status: Declined, Education has been provided regarding the importance of this vaccine but patient still declined. Advised may receive this vaccine at local pharmacy or Health Dept. Aware to provide a copy of the vaccination record if obtained from local pharmacy or Health Dept. Verbalized acceptance and understanding.  Pneumococcal vaccine status: Declined,  Education has been  provided regarding  the importance of this vaccine but patient still declined. Advised may receive this vaccine at local pharmacy or Health Dept. Aware to provide a copy of the vaccination record if obtained from local pharmacy or Health Dept. Verbalized acceptance and understanding.   Covid-19 vaccine status: Completed vaccines  Qualifies for Shingles Vaccine? No   Zostavax completed No   Shingrix Completed?: No.    Education has been provided regarding the importance of this vaccine. Patient has been advised to call insurance company to determine out of pocket expense if they have not yet received this vaccine. Advised may also receive vaccine at local pharmacy or Health Dept. Verbalized acceptance and understanding.  Screening Tests Health Maintenance  Topic Date Due   PNEUMOCOCCAL POLYSACCHARIDE VACCINE AGE 58-64 HIGH RISK  Never done   Pneumococcal Vaccine 56-45 Years old (1 - PCV) Never done   OPHTHALMOLOGY EXAM  Never done   Zoster Vaccines- Shingrix (1 of 2) Never done   COLONOSCOPY (Pts 45-75yrs Insurance coverage will need to be confirmed)  Never done   COVID-19 Vaccine (4 - Booster for [employer] series) 11/18/2020   FOOT EXAM  03/07/2021   INFLUENZA VACCINE  03/31/2021   HEMOGLOBIN A1C  06/24/2021   TETANUS/TDAP  10/16/2022   Hepatitis C Screening  Completed   HIV Screening  Completed   HPV VACCINES  Aged Out    Health Maintenance  Health Maintenance Due  Topic Date Due   PNEUMOCOCCAL POLYSACCHARIDE VACCINE AGE 58-64 HIGH RISK  Never done   Pneumococcal Vaccine 26-33 Years old (1 - PCV) Never done   OPHTHALMOLOGY EXAM  Never done   Zoster Vaccines- Shingrix (1 of 2) Never done   COLONOSCOPY (Pts 45-11yrs Insurance coverage will need to be confirmed)  Never done   COVID-19 Vaccine (4 - Booster for [employer] series) 11/18/2020   FOOT EXAM  03/07/2021    Colorectal cancer screening: states had but does not remember where  Lung Cancer Screening: (Low Dose CT Chest recommended if Age 69-80 years, 30  pack-year currently smoking OR have quit w/in 15years.) does not qualify.   Lung Cancer Screening Referral: no  Additional Screening:  Hepatitis C Screening: does qualify; Completed 06/20/2018  Vision Screening: Recommended annual ophthalmology exams for early detection of glaucoma and other disorders of the eye. Is the patient up to date with their annual eye exam?  No  Who is the provider or what is the name of the office in which the patient attends annual eye exams? none If pt is not established with a provider, would they like to be referred to a provider to establish care? No .   Dental Screening: Recommended annual dental exams for proper oral hygiene  Community Resource Referral / Chronic Care Management: CRR required this visit?  No   CCM required this visit?  No      Plan:     I have personally reviewed and noted the following in the patient's chart:   Medical and social history Use of alcohol, tobacco or illicit drugs  Current medications and supplements including opioid prescriptions. Patient is currently taking opioid prescriptions. Information provided to patient regarding non-opioid alternatives. Patient advised to discuss non-opioid treatment plan with their provider. Functional ability and status Nutritional status Physical activity Advanced directives List of other physicians Hospitalizations, surgeries, and ER visits in previous 12 months Vitals Screenings to include cognitive, depression, and falls Referrals and appointments  In addition, I have reviewed and discussed with patient certain preventive  protocols, quality metrics, and best practice recommendations. A written personalized care plan for preventive services as well as general preventive health recommendations were provided to patient.     Barb Merinoickeah E Julya Alioto, LPN   1/61/09607/13/2022   Nurse Notes:

## 2021-03-12 NOTE — Progress Notes (Signed)
This visit occurred during the SARS-CoV-2 public health emergency.  Safety protocols were in place, including screening questions prior to the visit, additional usage of staff PPE, and extensive cleaning of exam room while observing appropriate contact time as indicated for disinfecting solutions.  Subjective:     Patient ID: Mark [name] , male    DOB: 06-24-1961 , 60 y.o.   MRN: [id number]   Chief Complaint  Patient presents with   Diabetes    HPI  Suanne [name] presents for a hypertension and diabetes follow-up.  His blood pressure today is 140/80. He checks blood pressure at home and the average is 130s/60s.  He denies chest pain, headaches, shortness of breath and blurred vision.  He has been out of Losartan for one week.  His last A1C was 6.0 on 09/23/20.  Fasting glucose readings at home have been in the 80s.  He reports compliance with metformin and denies side effects.   Mr. Brands is in need of an opthalmology exam but does not leery due to previous experience with pupil dilation.  Wt Readings from Last 3 Encounters: 12/23/20 : 254 lb (115.2 kg) 09/23/20 : 259 lb 6.4 oz (117.7 kg) 06/10/20 : 252 lb (114.3 kg)   Diabetes He presents for his follow-up diabetic visit. He has type 2 diabetes mellitus. There are no hypoglycemic associated symptoms. Pertinent negatives for hypoglycemia include no dizziness or headaches. There are no diabetic associated symptoms. Pertinent negatives for diabetes include no chest pain, no fatigue, no polydipsia, no polyphagia and no polyuria. There are no hypoglycemic complications. There are no diabetic complications. Risk factors for coronary artery disease include diabetes mellitus, hypertension, sedentary lifestyle, obesity and male sex. He is compliant with treatment most of the time. His weight is stable. He is following a diabetic diet. He has not had a previous visit with a dietitian. He rarely participates in exercise. There is no change in  his home blood glucose trend. (Blood sugar was 80 yesterday) An ACE inhibitor/angiotensin II receptor blocker is being taken. He does not see a podiatrist.Eye exam is not current.  Hypertension This is a chronic problem. The current episode started more than 1 year ago. The problem is unchanged. The problem is controlled. Pertinent negatives include no anxiety, chest pain, headaches or palpitations. Past treatments include angiotensin blockers and lifestyle changes. There are no compliance problems.  There is no history of angina. There is no history of chronic renal disease.    Past Medical History:  Diagnosis Date   Abnormal lung function test    a. Reported to possibly be COPD, but per pulm note: "Possible COPD - PFT was more suggestive of restrictive defect and diffusion defect likely from obesity and CHF"   Chronic diastolic heart failure ([place]) 05/07/2018   CKD (chronic kidney disease), stage III (HCC)    Diabetes mellitus type 2 in obese [facility])    Elevated troponin 05/07/2018   Hearing impaired    Hyperlipidemia    Hypertension    LBBB (left bundle branch block)    LVH (left ventricular hypertrophy)    Mild aortic stenosis    Mild pulmonary hypertension (HCC)    Morbid obesity (HCC)    Sleep apnea      Family History  Problem Relation Age of Onset   Hypertension Mother    Hypertension Father      Current Outpatient Medications:    aspirin EC 81 MG EC tablet, Take 1 tablet (81 mg total) by mouth daily. (  Patient not taking: No sig reported), Disp: 30 tablet, Rfl: 0   atorvastatin (LIPITOR) 20 MG tablet, TAKE 1 TABLET BY MOUTH EVERY DAY, Disp: 90 tablet, Rfl: 0   carvedilol (COREG) 6.25 MG tablet, TAKE 1 TABLET BY MOUTH TWICE A DAY WITH FOOD, Disp: 180 tablet, Rfl: 1   colchicine 0.6 MG tablet, Take 1 tablet (0.6 mg total) by mouth daily., Disp: 5 tablet, Rfl: 1   gabapentin (NEURONTIN) 100 MG capsule, TAKE 1 CAPSULE (100 MG TOTAL) BY MOUTH AT BEDTIME., Disp: 90 capsule, Rfl: 2    ipratropium-albuterol (DUONEB) 0.5-2.5 (3) MG/3ML SOLN, Take 3 mLs by nebulization every 6 (six) hours as needed. (Patient not taking: Reported on 03/12/2021), Disp: , Rfl:    losartan (COZAAR) 25 MG tablet, TAKE 1 TABLET (25 MG TOTAL) BY MOUTH DAILY., Disp: 90 tablet, Rfl: 1   meloxicam (MOBIC) 7.5 MG tablet, Take 7.5 mg by mouth daily., Disp: , Rfl:    metFORMIN (GLUCOPHAGE) 500 MG tablet, TAKE 1 TABLET BY MOUTH TWICE A DAY WITH BREAKFAST AND DINNER, Disp: 180 tablet, Rfl: 1   oxyCODONE-acetaminophen (PERCOCET) 5-325 MG tablet, Take 1 tablet by mouth every 4 (four) hours as needed for severe pain., Disp: 30 tablet, Rfl: 0   torsemide (DEMADEX) 20 MG tablet, TAKE 2 TABLETS (40 MG TOTAL) BY MOUTH DAILY., Disp: 180 tablet, Rfl: 1   No Known Allergies   Review of Systems  Constitutional: Negative.  Negative for fatigue.  Respiratory: Negative.    Cardiovascular: Negative.  Negative for chest pain and palpitations.  Endocrine: Negative for polydipsia, polyphagia and polyuria.  Neurological:  Negative for dizziness and headaches.  Psychiatric/Behavioral: Negative.      Today's Vitals   03/12/21 1126  BP: 128/78  Pulse: 71  Temp: 98.7 F (37.1 C)  Weight: 250 lb 10.6 oz (113.7 kg)  Height: '5\' 8"'  (1.727 m)  PainSc: 0-No pain   Body mass index is 38.11 kg/m.   Objective:  Physical Exam Cardiovascular:     Rate and Rhythm: Normal rate and regular rhythm.     Pulses: Normal pulses.     Heart sounds: Normal heart sounds.  Pulmonary:     Effort: Pulmonary effort is normal.     Breath sounds: Normal breath sounds.  Neurological:     Mental Status: He is alert.        Assessment And Plan:     1. Type 2 diabetes mellitus with other circulatory complication, without long-term current use of insulin (HCC) Chronic, stable Continue with current medications Encouraged to limit intake of sugary foods and drinks Encouraged to increase physical activity to 150 minutes per week Continue  with metformin daily He has had a referral to ophthalmology in January and he is to follow up with Dr. Venetia Maxon - Lipid panel - CMP14+EGFR - Hemoglobin A1c  2. Essential hypertension B/P is fairly controlled.  CMP ordered to check renal function.  The importance of regular exercise and dietary modification was stressed to the patient.  Stressed importance of losing ten percent of her body weight to help with B/P control.  - CMP14+EGFR  3. Mixed hyperlipidemia Chronic, controlled Continue with current medications, tolerating medications well - Lipid panel  4. Pulmonary hypertension ([place]) He is advised to follow up with Cardiology as well  5. Chronic bronchitis, unspecified chronic bronchitis type (HCC) Stable, no current medications  6. PAD (peripheral artery disease) (HCC) Stable,  7. Morbid obesity ([place]) Chronic Discussed healthy diet and regular exercise options  Encouraged  to exercise at least 150 minutes per week with 2 days of strength training    Patient was given opportunity to ask questions. Patient verbalized understanding of the plan and was able to repeat key elements of the plan. All questions were answered to their satisfaction.  [name], FNP   I, [name], FNP, have reviewed all documentation for this visit. The documentation on 03/12/21 for the exam, diagnosis, procedures, and orders are all accurate and complete.   IF YOU HAVE BEEN REFERRED TO A SPECIALIST, IT MAY TAKE 1-2 WEEKS TO SCHEDULE/PROCESS THE REFERRAL. IF YOU HAVE NOT HEARD FROM US/SPECIALIST IN TWO WEEKS, PLEASE GIVE [place] A CALL AT [phone] X [phone].   THE PATIENT IS ENCOURAGED TO PRACTICE SOCIAL DISTANCING DUE TO THE COVID-19 PANDEMIC.

## 2021-03-12 NOTE — Patient Instructions (Signed)
[name] , Thank you for taking time to come for your Medicare Wellness Visit. I appreciate your ongoing commitment to your health goals. Please review the following plan we discussed and let me know if I can assist you in the future.   Screening recommendations/referrals: Colonoscopy: says had but does not know where Recommended yearly ophthalmology/optometry visit for glaucoma screening and checkup Recommended yearly dental visit for hygiene and checkup  Vaccinations: Influenza vaccine: decline Pneumococcal vaccine: decline Tdap vaccine: 10/16/2012, due 10/16/2022 Shingles vaccine: discussed   Covid-19:  08/20/2020, 02/22/2020, 02/01/2020  Advanced directives: Advance directive discussed with you today. Even though you declined this today please call our office should you change your mind and we can give you the proper paperwork for you to fill out.  Conditions/risks identified: none  Next appointment: Follow up in one year for your annual wellness visit   Preventive Care 40-64 Years, Male Preventive care refers to lifestyle choices and visits with your health care provider that can promote health and wellness. What does preventive care include? A yearly physical exam. This is also called an annual well check. Dental exams once or twice a year. Routine eye exams. Ask your health care provider how often you should have your eyes checked. Personal lifestyle choices, including: Daily care of your teeth and gums. Regular physical activity. Eating a healthy diet. Avoiding tobacco and drug use. Limiting alcohol use. Practicing safe sex. Taking low-dose aspirin every day starting at age 41. What happens during an annual well check? The services and screenings done by your health care provider during your annual well check will depend on your age, overall health, lifestyle risk factors, and family history of disease. Counseling  Your health care provider may ask you questions about  your: Alcohol use. Tobacco use. Drug use. Emotional well-being. Home and relationship well-being. Sexual activity. Eating habits. Work and work Astronomer. Screening  You may have the following tests or measurements: Height, weight, and BMI. Blood pressure. Lipid and cholesterol levels. These may be checked every 5 years, or more frequently if you are over 41 years old. Skin check. Lung cancer screening. You may have this screening every year starting at age 30 if you have a 30-pack-year history of smoking and currently smoke or have quit within the past 15 years. Fecal occult blood test (FOBT) of the stool. You may have this test every year starting at age 42. Flexible sigmoidoscopy or colonoscopy. You may have a sigmoidoscopy every 5 years or a colonoscopy every 10 years starting at age 2. Prostate cancer screening. Recommendations will vary depending on your family history and other risks. Hepatitis C blood test. Hepatitis B blood test. Sexually transmitted disease (STD) testing. Diabetes screening. This is done by checking your blood sugar (glucose) after you have not eaten for a while (fasting). You may have this done every 1-3 years. Discuss your test results, treatment options, and if necessary, the need for more tests with your health care provider. Vaccines  Your health care provider may recommend certain vaccines, such as: Influenza vaccine. This is recommended every year. Tetanus, diphtheria, and acellular pertussis (Tdap, Td) vaccine. You may need a Td booster every 10 years. Zoster vaccine. You may need this after age 26. Pneumococcal 13-valent conjugate (PCV13) vaccine. You may need this if you have certain conditions and have not been vaccinated. Pneumococcal polysaccharide (PPSV23) vaccine. You may need one or two doses if you smoke cigarettes or if you have certain conditions. Talk to your health care  provider about which screenings and vaccines you need and how  often you need them. This information is not intended to replace advice given to you by your health care provider. Make sure you discuss any questions you have with your health care provider. Document Released: 09/13/2015 Document Revised: 05/06/2016 Document Reviewed: 06/18/2015 Elsevier Interactive Patient Education  2017 [employer] Prevention in the Home Falls can cause injuries. They can happen to people of all ages. There are many things you can do to make your home safe and to help prevent falls. What can I do on the outside of my home? Regularly fix the edges of walkways and driveways and fix any cracks. Remove anything that might make you trip as you walk through a door, such as a raised step or threshold. Trim any bushes or trees on the path to your home. Use bright outdoor lighting. Clear any walking paths of anything that might make someone trip, such as rocks or tools. Regularly check to see if handrails are loose or broken. Make sure that both sides of any steps have handrails. Any raised decks and porches should have guardrails on the edges. Have any leaves, snow, or ice cleared regularly. Use sand or salt on walking paths during winter. Clean up any spills in your garage right away. This includes oil or grease spills. What can I do in the bathroom? Use night lights. Install grab bars by the toilet and in the tub and shower. Do not use towel bars as grab bars. Use non-skid mats or decals in the tub or shower. If you need to sit down in the shower, use a plastic, non-slip stool. Keep the floor dry. Clean up any water that spills on the floor as soon as it happens. Remove soap buildup in the tub or shower regularly. Attach bath mats securely with double-sided non-slip rug tape. Do not have throw rugs and other things on the floor that can make you trip. What can I do in the bedroom? Use night lights. Make sure that you have a light by your bed that is easy to  reach. Do not use any sheets or blankets that are too big for your bed. They should not hang down onto the floor. Have a firm chair that has side arms. You can use this for support while you get dressed. Do not have throw rugs and other things on the floor that can make you trip. What can I do in the kitchen? Clean up any spills right away. Avoid walking on wet floors. Keep items that you use a lot in easy-to-reach places. If you need to reach something above you, use a strong step stool that has a grab bar. Keep electrical cords out of the way. Do not use floor polish or wax that makes floors slippery. If you must use wax, use non-skid floor wax. Do not have throw rugs and other things on the floor that can make you trip. What can I do with my stairs? Do not leave any items on the stairs. Make sure that there are handrails on both sides of the stairs and use them. Fix handrails that are broken or loose. Make sure that handrails are as long as the stairways. Check any carpeting to make sure that it is firmly attached to the stairs. Fix any carpet that is loose or worn. Avoid having throw rugs at the top or bottom of the stairs. If you do have throw rugs, attach them to the  floor with carpet tape. Make sure that you have a light switch at the top of the stairs and the bottom of the stairs. If you do not have them, ask someone to add them for you. What else can I do to help prevent falls? Wear shoes that: Do not have high heels. Have rubber bottoms. Are comfortable and fit you well. Are closed at the toe. Do not wear sandals. If you use a stepladder: Make sure that it is fully opened. Do not climb a closed stepladder. Make sure that both sides of the stepladder are locked into place. Ask someone to hold it for you, if possible. Clearly mark and make sure that you can see: Any grab bars or handrails. First and last steps. Where the edge of each step is. Use tools that help you move  around (mobility aids) if they are needed. These include: Canes. Walkers. Scooters. Crutches. Turn on the lights when you go into a dark area. Replace any light bulbs as soon as they burn out. Set up your furniture so you have a clear path. Avoid moving your furniture around. If any of your floors are uneven, fix them. If there are any pets around you, be aware of where they are. Review your medicines with your doctor. Some medicines can make you feel dizzy. This can increase your chance of falling. Ask your doctor what other things that you can do to help prevent falls. This information is not intended to replace advice given to you by your health care provider. Make sure you discuss any questions you have with your health care provider. Document Released: 06/13/2009 Document Revised: 01/23/2016 Document Reviewed: 09/21/2014 Elsevier Interactive Patient Education  2017 [employer].

## 2021-03-12 NOTE — Addendum Note (Signed)
Addended by: Elisha Ponder E on: 03/12/2021 12:09 PM   Modules accepted: Orders

## 2021-03-13 LAB — CMP14+EGFR
ALT: 12 IU/L (ref 0–44)
AST: 11 IU/L (ref 0–40)
Albumin/Globulin Ratio: 1.6 (ref 1.2–2.2)
Albumin: 4.4 g/dL (ref 3.8–4.8)
Alkaline Phosphatase: 100 IU/L (ref 44–121)
BUN/Creatinine Ratio: 16 (ref 10–24)
BUN: 22 mg/dL (ref 8–27)
Bilirubin Total: 0.8 mg/dL (ref 0.0–1.2)
CO2: 29 mmol/L (ref 20–29)
Calcium: 9.4 mg/dL (ref 8.6–10.2)
Chloride: 102 mmol/L (ref 96–106)
Creatinine, Ser: 1.36 mg/dL — ABNORMAL HIGH (ref 0.76–1.27)
Globulin, Total: 2.7 g/dL (ref 1.5–4.5)
Glucose: 101 mg/dL — ABNORMAL HIGH (ref 65–99)
Potassium: 4.2 mmol/L (ref 3.5–5.2)
Sodium: 145 mmol/L — ABNORMAL HIGH (ref 134–144)
Total Protein: 7.1 g/dL (ref 6.0–8.5)
eGFR: 59 mL/min/{1.73_m2} — ABNORMAL LOW (ref >59)

## 2021-03-13 LAB — LIPID PANEL
Chol/HDL Ratio: 3.5 ratio (ref 0.0–5.0)
Cholesterol, Total: 178 mg/dL (ref 100–199)
HDL: 51 mg/dL (ref >39)
LDL Chol Calc (NIH): 105 mg/dL — ABNORMAL HIGH (ref 0–99)
Triglycerides: 126 mg/dL (ref 0–149)
VLDL Cholesterol Cal: 22 mg/dL (ref 5–40)

## 2021-03-13 LAB — HEMOGLOBIN A1C
Est. average glucose Bld gHb Est-mCnc: 146 mg/dL
Hgb A1c MFr Bld: 6.7 % — ABNORMAL HIGH (ref 4.8–5.6)

## 2021-03-16 ENCOUNTER — Other Ambulatory Visit: Payer: Self-pay | Admitting: Nurse Practitioner

## 2021-03-16 DIAGNOSIS — I5032 Chronic diastolic (congestive) heart failure: Secondary | ICD-10-CM

## 2021-03-19 ENCOUNTER — Other Ambulatory Visit: Payer: Self-pay | Admitting: Nurse Practitioner

## 2021-06-17 ENCOUNTER — Ambulatory Visit: Payer: Medicare HMO | Admitting: Nurse Practitioner

## 2021-06-17 ENCOUNTER — Other Ambulatory Visit: Payer: Self-pay | Admitting: Nurse Practitioner

## 2021-06-22 ENCOUNTER — Other Ambulatory Visit: Payer: Self-pay | Admitting: Nurse Practitioner

## 2021-06-23 ENCOUNTER — Ambulatory Visit: Payer: Medicare HMO | Admitting: Nurse Practitioner

## 2021-07-07 ENCOUNTER — Ambulatory Visit: Payer: Medicare HMO | Admitting: Nurse Practitioner

## 2021-09-04 ENCOUNTER — Ambulatory Visit (INDEPENDENT_AMBULATORY_CARE_PROVIDER_SITE_OTHER): Payer: Medicare HMO | Admitting: Podiatry

## 2021-09-04 ENCOUNTER — Encounter: Payer: Self-pay | Admitting: Podiatry

## 2021-09-04 ENCOUNTER — Other Ambulatory Visit: Payer: Self-pay

## 2021-09-04 ENCOUNTER — Ambulatory Visit (INDEPENDENT_AMBULATORY_CARE_PROVIDER_SITE_OTHER): Payer: Medicare HMO

## 2021-09-04 DIAGNOSIS — Z9889 Other specified postprocedural states: Secondary | ICD-10-CM

## 2021-09-04 DIAGNOSIS — M778 Other enthesopathies, not elsewhere classified: Secondary | ICD-10-CM

## 2021-09-04 MED ORDER — TRIAMCINOLONE ACETONIDE 10 MG/ML IJ SUSP
10.0000 mg | Freq: Once | INTRAMUSCULAR | Status: AC
  Administered 2021-09-04: 10 mg

## 2021-09-05 NOTE — Progress Notes (Signed)
Subjective:   Patient ID: Mark [name], male   DOB: 61 y.o.   MRN: [id number]   HPI Patient presents with a lot of pain in his right midfoot lateral side and states it sore and makes it hard to walk on.  Does not remember injury   ROS      Objective:  Physical Exam  Neurovascular status intact with inflammation pain in the lateral right foot that is painful when pressed making walking difficult     Assessment:  Midfoot inflammatory condition consistent with probable tendinitis or other condition     Plan:  H&P reviewed condition sterile prep and injected the tendon area right 3 mg Kenalog 5 mg Xylocaine advised on ice therapy reappoint to recheck

## 2021-09-17 ENCOUNTER — Other Ambulatory Visit: Payer: Self-pay | Admitting: Nurse Practitioner

## 2021-09-17 DIAGNOSIS — I5032 Chronic diastolic (congestive) heart failure: Secondary | ICD-10-CM

## 2021-09-29 ENCOUNTER — Ambulatory Visit: Payer: Medicare HMO | Admitting: Nurse Practitioner

## 2021-10-21 ENCOUNTER — Other Ambulatory Visit: Payer: Self-pay | Admitting: Nurse Practitioner

## 2021-11-03 ENCOUNTER — Encounter: Payer: Medicare HMO | Admitting: Nurse Practitioner

## 2021-11-03 NOTE — Patient Instructions (Signed)
Type 2 Diabetes Mellitus, Diagnosis, Adult °Type 2 diabetes (type 2 diabetes mellitus) is a long-term (chronic) disease. It may happen when there is one or both of these problems: °The pancreas does not make enough insulin. °The body does not react in a normal way to insulin that it makes. °Insulin lets sugars go into cells in your body. If you have type 2 diabetes, sugars cannot get into your cells. Sugars build up in the blood. This causes high blood sugar. °What are the causes? °The exact cause of this condition is not known. °What increases the risk? °Having type 2 diabetes in your family. °Being overweight or very overweight. °Not being active. °Your body not reacting in a normal way to the insulin it makes. °Having higher than normal blood sugar over time. °Having a type of diabetes when you were pregnant. °Having a condition that causes small fluid-filled sacs on your ovaries. °What are the signs or symptoms? °At first, you may have no symptoms. You will get symptoms slowly. They may include: °More thirst than normal. °More hunger than normal. °Needing to pee more than normal. °Losing weight without trying. °Feeling tired. °Feeling weak. °Seeing things blurry. °Dark patches on your skin. °How is this treated? °This condition may be treated by a diabetes expert. You may need to: °Follow an eating plan made by a food expert (dietitian). °Get regular exercise. °Find ways to deal with stress. °Check blood sugar as often as told. °Take medicines. °Your doctor will set treatment goals for you. Your blood sugar should be at these levels: °Before meals: 80-130 mg/dL (4.4-7.2 mmol/L). °After meals: below 180 mg/dL (10 mmol/L). °Over the last 2-3 months: less than 7%. °Follow these instructions at home: °Medicines °Take your diabetes medicines or insulin every day. °Take medicines as told to help you prevent other problems caused by this condition. You may need: °Aspirin. °Medicine to lower cholesterol. °Medicine to  control blood pressure. °Questions to ask your doctor °Should I meet with a diabetes educator? °What medicines do I need, and when should I take them? °What will I need to treat my condition at home? °When should I check my blood sugar? °Where can I find a support group? °Who can I call if I have questions? °When is my next doctor visit? °General instructions °Take over-the-counter and prescription medicines only as told by your doctor. °Keep all follow-up visits. °Where to find more information °For help and guidance and more information about diabetes, please go to: °American Diabetes Association (ADA): www.diabetes.org °American Association of Diabetes Care and Education Specialists (ADCES): www.diabeteseducator.org °International Diabetes Federation (IDF): www.idf.org °Contact a doctor if: °Your blood sugar is at or above 240 mg/dL (13.3 mmol/L) for 2 days in a row. °You have been sick for 2 days or more, and you are not getting better. °You have had a fever for 2 days or more, and you are not getting better. °You have any of these problems for more than 6 hours: °You cannot eat or drink. °You feel like you may vomit. °You vomit. °You have watery poop (diarrhea). °Get help right away if: °Your blood sugar is lower than 54 mg/dL (3 mmol/L). °You feel mixed up (confused). °You have trouble thinking clearly. °You have trouble breathing. °You have medium or large ketone levels in your pee. °These symptoms may be an emergency. Get help right away. Call your local emergency services (911 in the [place] °Do not wait to see if the symptoms will go away. °Do not drive yourself   to the hospital. °Summary °Type 2 diabetes is a long-term disease. Your pancreas may not make enough insulin, or your body may not react in a normal way to insulin that it makes. °This condition is treated with an eating plan, lifestyle changes, and medicines. °Your doctor will set treatment goals for you. These will help you keep your blood sugar  in a healthy range. °Keep all follow-up visits. °This information is not intended to replace advice given to you by your health care provider. Make sure you discuss any questions you have with your health care provider. °Document Revised: 11/11/2020 Document Reviewed: 11/11/2020 °Elsevier Patient Education © 2022 Elsevier Inc. ° °

## 2021-11-03 NOTE — Progress Notes (Signed)
Not seen

## 2021-11-22 ENCOUNTER — Other Ambulatory Visit: Payer: Self-pay | Admitting: Nurse Practitioner

## 2021-12-04 ENCOUNTER — Ambulatory Visit (INDEPENDENT_AMBULATORY_CARE_PROVIDER_SITE_OTHER): Payer: Medicare HMO

## 2021-12-04 ENCOUNTER — Other Ambulatory Visit: Payer: Self-pay | Admitting: Podiatry

## 2021-12-04 ENCOUNTER — Ambulatory Visit (INDEPENDENT_AMBULATORY_CARE_PROVIDER_SITE_OTHER): Payer: Medicare HMO | Admitting: Podiatry

## 2021-12-04 DIAGNOSIS — M779 Enthesopathy, unspecified: Secondary | ICD-10-CM

## 2021-12-04 DIAGNOSIS — M109 Gout, unspecified: Secondary | ICD-10-CM

## 2021-12-04 DIAGNOSIS — M205X1 Other deformities of toe(s) (acquired), right foot: Secondary | ICD-10-CM | POA: Diagnosis not present

## 2021-12-04 DIAGNOSIS — M7752 Other enthesopathy of left foot: Secondary | ICD-10-CM | POA: Diagnosis not present

## 2021-12-04 DIAGNOSIS — Z9889 Other specified postprocedural states: Secondary | ICD-10-CM

## 2021-12-04 DIAGNOSIS — M79672 Pain in left foot: Secondary | ICD-10-CM

## 2021-12-04 MED ORDER — METHYLPREDNISOLONE 4 MG PO TBPK: ORAL_TABLET | ORAL | 0 refills | Status: DC

## 2021-12-04 MED ORDER — TRIAMCINOLONE ACETONIDE 10 MG/ML IJ SUSP
10.0000 mg | Freq: Once | INTRAMUSCULAR | Status: AC
  Administered 2021-12-04: 10 mg

## 2021-12-04 NOTE — Patient Instructions (Signed)
Gout ?Gout is a condition that causes painful swelling of the joints. Gout is a type of inflammation of the joints (arthritis). This condition is caused by having too much uric acid in the body. Uric acid is a chemical that forms when the body breaks down substances called purines. Purines are important for building body proteins. ?When the body has too much uric acid, sharp crystals can form and build up inside the joints. This causes pain and swelling. Gout attacks can happen quickly and may be very painful (acute gout). Over time, the attacks can affect more joints and become more frequent (chronic gout). Gout can also cause uric acid to build up under the skin and inside the kidneys. ?What are the causes? ?This condition is caused by too much uric acid in your blood. This can happen because: ?Your kidneys do not remove enough uric acid from your blood. This is the most common cause. ?Your body makes too much uric acid. This can happen with some cancers and cancer treatments. It can also occur if your body is breaking down too many red blood cells (hemolytic anemia). ?You eat too many foods that are high in purines. These foods include organ meats and some seafood. Alcohol, especially beer, is also high in purines. ?A gout attack may be triggered by trauma or stress. ?What increases the risk? ?You are more likely to develop this condition if you: ?Have a family history of gout. ?Are male and middle-aged. ?Are male and have gone through menopause. ?Are obese. ?Frequently drink alcohol, especially beer. ?Are dehydrated. ?Lose weight too quickly. ?Have an organ transplant. ?Have lead poisoning. ?Take certain medicines, including aspirin, cyclosporine, diuretics, levodopa, and niacin. ?Have kidney disease. ?Have a skin condition called psoriasis. ?What are the signs or symptoms? ?An attack of acute gout happens quickly. It usually occurs in just one joint. The most common place is the big toe. Attacks often start  at night. Other joints that may be affected include joints of the feet, ankle, knee, fingers, wrist, or elbow. Symptoms of this condition may include: ?Severe pain. ?Warmth. ?Swelling. ?Stiffness. ?Tenderness. The affected joint may be very painful to touch. ?Shiny, red, or purple skin. ?Chills and fever. ?Chronic gout may cause symptoms more frequently. More joints may be involved. You may also have white or yellow lumps (tophi) on your hands or feet or in other areas near your joints. ?How is this diagnosed? ?This condition is diagnosed based on your symptoms, medical history, and physical exam. You may have tests, such as: ?Blood tests to measure uric acid levels. ?Removal of joint fluid with a thin needle (aspiration) to look for uric acid crystals. ?X-rays to look for joint damage. ?How is this treated? ?Treatment for this condition has two phases: treating an acute attack and preventing future attacks. Acute gout treatment may include medicines to reduce pain and swelling, including: ?NSAIDs. ?Steroids. These are strong anti-inflammatory medicines that can be taken by mouth (orally) or injected into a joint. ?Colchicine. This medicine relieves pain and swelling when it is taken soon after an attack. It can be given by mouth or through an IV. ?Preventive treatment may include: ?Daily use of smaller doses of NSAIDs or colchicine. ?Use of a medicine that reduces uric acid levels in your blood. ?Changes to your diet. You may need to see a dietitian about what to eat and drink to prevent gout. ?Follow these instructions at home: ?During a gout attack ? ?If directed, put ice on the affected area: ?  Put ice in a plastic bag. ?Place a towel between your skin and the bag. ?Leave the ice on for 20 minutes, 2-3 times a day. ?Raise (elevate) the affected joint above the level of your heart as often as possible. ?Rest the joint as much as possible. If the affected joint is in your leg, you may be given crutches to  use. ?Follow instructions from your health care provider about eating or drinking restrictions. ?Avoiding future gout attacks ?Follow a low-purine diet as told by your dietitian or health care provider. Avoid foods and drinks that are high in purines, including liver, kidney, anchovies, asparagus, herring, mushrooms, mussels, and beer. ?Maintain a healthy weight or lose weight if you are overweight. If you want to lose weight, talk with your health care provider. It is important that you do not lose weight too quickly. ?Start or maintain an exercise program as told by your health care provider. ?Eating and drinking ?Drink enough fluids to keep your urine pale yellow. ?If you drink alcohol: ?Limit how much you use to: ?0-1 drink a day for women. ?0-2 drinks a day for men. ?Be aware of how much alcohol is in your drink. In the [place] one drink equals one 12 oz bottle of beer (355 mL) one 5 oz glass of wine (148 mL), or one 1? oz glass of hard liquor (44 mL). ?General instructions ?Take over-the-counter and prescription medicines only as told by your health care provider. ?Do not drive or use heavy machinery while taking prescription pain medicine. ?Return to your normal activities as told by your health care provider. Ask your health care provider what activities are safe for you. ?Keep all follow-up visits as told by your health care provider. This is important. ?Contact a health care provider if you have: ?Another gout attack. ?Continuing symptoms of a gout attack after 10 days of treatment. ?Side effects from your medicines. ?Chills or a fever. ?Burning pain when you urinate. ?Pain in your lower back or belly. ?Get help right away if you: ?Have severe or uncontrolled pain. ?Cannot urinate. ?Summary ?Gout is painful swelling of the joints caused by inflammation. ?The most common site of pain is the big toe, but it can affect other joints in the body. ?Medicines and dietary changes can help to prevent and treat gout  attacks. ?This information is not intended to replace advice given to you by your health care provider. Make sure you discuss any questions you have with your health care provider. ?Document Revised: 02/25/2018 Document Reviewed: 03/09/2018 ?Elsevier Patient Education ? 2022 Elsevier Inc. ? ?

## 2021-12-04 NOTE — Progress Notes (Signed)
Subjective:  ? ?Patient ID: Mark [name], male   DOB: 61 y.o.   MRN: [id number]  ? ?HPI ?Patient presents stating he has developed discomfort in the big toe joint left over the last week does not remember specific injury and states its been very tender.  Also has some swelling in his ankle and does not remember what may have occurred and had this problem in his right foot last year ? ? ?ROS ? ? ?   ?Objective:  ?Physical Exam  ?Ocular status unchanged with patient found to have inflammation of the ankle left that is inflamed and also has inflammation and redness and pain around the first MPJ left medial side ? ?   ?Assessment:  ?Probability for gout of the left first MPJ with also inflammatory capsulitis and capsulitis or gout of the ankle joint left ? ?   ?Plan:  ?H&P reviewed conditions and at this point I did do sterile prep injected the first MPJ left 3 mg Kenalog 5 mg Xylocaine advised on anti-inflammatories and placed on steroids 6-day Dosepak discussed gout of his ankle and foot and that we may need to get him on medication if this comes back again.  Gave him education and sheets on foods to avoid and gout education currently ? ?X-rays are negative for signs of fracture appears to be a soft tissue inflammatory condition ?   ? ? ?

## 2021-12-14 ENCOUNTER — Other Ambulatory Visit: Payer: Self-pay | Admitting: Nurse Practitioner

## 2021-12-16 ENCOUNTER — Ambulatory Visit: Payer: Medicare HMO | Admitting: Nurse Practitioner

## 2022-01-03 ENCOUNTER — Other Ambulatory Visit: Payer: Self-pay | Admitting: Nurse Practitioner

## 2022-01-05 ENCOUNTER — Other Ambulatory Visit: Payer: Self-pay | Admitting: Nurse Practitioner

## 2022-01-05 ENCOUNTER — Encounter: Payer: Self-pay | Admitting: Nurse Practitioner

## 2022-01-05 ENCOUNTER — Ambulatory Visit (INDEPENDENT_AMBULATORY_CARE_PROVIDER_SITE_OTHER): Payer: Medicare HMO | Admitting: Nurse Practitioner

## 2022-01-05 VITALS — BP 138/80 | HR 67 | Temp 98.6°F | Ht 68.0 in | Wt 264.0 lb

## 2022-01-05 DIAGNOSIS — Z6841 Body Mass Index (BMI) 40.0 and over, adult: Secondary | ICD-10-CM

## 2022-01-05 DIAGNOSIS — E782 Mixed hyperlipidemia: Secondary | ICD-10-CM | POA: Diagnosis not present

## 2022-01-05 DIAGNOSIS — E1159 Type 2 diabetes mellitus with other circulatory complications: Secondary | ICD-10-CM | POA: Diagnosis not present

## 2022-01-05 DIAGNOSIS — I272 Pulmonary hypertension, unspecified: Secondary | ICD-10-CM | POA: Diagnosis not present

## 2022-01-05 DIAGNOSIS — Z1211 Encounter for screening for malignant neoplasm of colon: Secondary | ICD-10-CM | POA: Diagnosis not present

## 2022-01-05 DIAGNOSIS — I5032 Chronic diastolic (congestive) heart failure: Secondary | ICD-10-CM

## 2022-01-05 NOTE — Progress Notes (Signed)
?Industrial/product designer as a Education administrator for [employer], FNP.,have documented all relevant documentation on the behalf of Mark Brine, FNP,as directed by  Mark Brine, FNP while in the presence of Mark [name], Mark [name]. ? ?This visit occurred during the SARS-CoV-2 public health emergency.  Safety protocols were in place, including screening questions prior to the visit, additional usage of staff PPE, and extensive cleaning of exam room while observing appropriate contact time as indicated for disinfecting solutions. ? ?Subjective:  ?  ? Patient ID: Mark [name] , male    DOB: 03-09-1961 , 61 y.o.   MRN: [id number] ? ? ?Chief Complaint  ?Patient presents with  ? Diabetes  ? ? ?HPI ? ?Patient Is here for a f/u on his diabetes and blood pressure.  His dad is in the hospital and will be going into hospice care. He does admit to not eating the best since that time.  ? ?Diabetes ?He presents for his follow-up diabetic visit. He has type 2 diabetes mellitus. There are no hypoglycemic associated symptoms. Pertinent negatives for hypoglycemia include no dizziness or headaches. There are no diabetic associated symptoms. Pertinent negatives for diabetes include no chest pain, no fatigue, no polydipsia, no polyphagia and no polyuria. There are no hypoglycemic complications. There are no diabetic complications. Risk factors for coronary artery disease include diabetes mellitus, hypertension, sedentary lifestyle, obesity and male sex. He is compliant with treatment most of the time. His weight is stable. He is following a diabetic diet. He has not had a previous visit with a dietitian. He rarely participates in exercise. There is no change in his home blood glucose trend. (Checking blood sugar but does not tell me the reading) An ACE inhibitor/angiotensin II receptor blocker is being taken. He does not see a podiatrist.Eye exam is not current.  ?Hypertension ?This is a chronic problem. The current episode started more than 1 year  ago. The problem is unchanged. The problem is controlled. Pertinent negatives include no anxiety, chest pain, headaches or palpitations. Past treatments include angiotensin blockers and lifestyle changes. There are no compliance problems.  There is no history of angina. There is no history of chronic renal disease.   ? ?Past Medical History:  ?Diagnosis Date  ? Abnormal lung function test   ? a. Reported to possibly be COPD, but per pulm note: "Possible COPD - PFT was more suggestive of restrictive defect and diffusion defect likely from obesity and CHF"  ? Chronic diastolic heart failure ([place]) 05/07/2018  ? CKD (chronic kidney disease), stage III ([place])   ? Diabetes mellitus type 2 in obese [facility])   ? Elevated troponin 05/07/2018  ? Hearing impaired   ? Hyperlipidemia   ? Hypertension   ? LBBB (left bundle branch block)   ? LVH (left ventricular hypertrophy)   ? Mild aortic stenosis   ? Mild pulmonary hypertension ([place])   ? Morbid obesity ([place])   ? Sleep apnea   ?  ? ?Family History  ?Problem Relation Age of Onset  ? Hypertension Mother   ? Hypertension Father   ? ? ? ?Current Outpatient Medications:  ?  atorvastatin (LIPITOR) 20 MG tablet, TAKE 1 TABLET BY MOUTH EVERY DAY, Disp: 90 tablet, Rfl: 0 ?  carvedilol (COREG) 6.25 MG tablet, TAKE 1 TABLET BY MOUTH TWICE A DAY WITH FOOD, Disp: 180 tablet, Rfl: 1 ?  colchicine 0.6 MG tablet, Take 1 tablet (0.6 mg total) by mouth daily., Disp: 5 tablet, Rfl: 1 ?  gabapentin (NEURONTIN) 100 MG  capsule, TAKE 1 CAPSULE (100 MG TOTAL) BY MOUTH AT BEDTIME., Disp: 90 capsule, Rfl: 2 ?  losartan (COZAAR) 25 MG tablet, TAKE 1 TABLET (25 MG TOTAL) BY MOUTH DAILY. PATIENT NEEDS APPT BEFORE 30 DAY SUPPLY ENDS., Disp: 30 tablet, Rfl: 0 ?  meloxicam (MOBIC) 7.5 MG tablet, Take 7.5 mg by mouth daily., Disp: , Rfl:  ?  metFORMIN (GLUCOPHAGE) 500 MG tablet, TAKE 1 TABLET BY MOUTH TWICE A DAY WITH BREAKFAST AND DINNER, Disp: 180 tablet, Rfl: 1 ?  torsemide (DEMADEX) 20 MG tablet, TAKE 2 TABLETS  (40 MG TOTAL) BY MOUTH DAILY., Disp: 180 tablet, Rfl: 1  ? ?No Known Allergies  ? ?Review of Systems  ?Constitutional: Negative.  Negative for fatigue.  ?HENT:  Positive for hearing loss (Wears hearing aids).   ?Eyes: Negative.   ?Respiratory: Negative.    ?Cardiovascular: Negative.  Negative for chest pain and palpitations.  ?Gastrointestinal: Negative.   ?Endocrine: Negative.  Negative for polydipsia, polyphagia and polyuria.  ?Genitourinary: Negative.   ?Musculoskeletal: Negative.   ?Skin: Negative.   ?Allergic/Immunologic: Negative.   ?Neurological: Negative.  Negative for dizziness and headaches.  ?Hematological: Negative.   ?Psychiatric/Behavioral: Negative.     ? ?Today's Vitals  ? 01/05/22 1512  ?BP: 138/80  ?Pulse: 67  ?Temp: 98.6 ?F (37 ?C)  ?TempSrc: Oral  ?Weight: 264 lb (119.7 kg)  ?Height: '5\' 8"'  (1.727 m)  ? ?Body mass index is 40.14 kg/m?.  ?Wt Readings from Last 3 Encounters:  ?01/05/22 264 lb (119.7 kg)  ?03/12/21 250 lb 10.6 oz (113.7 kg)  ?03/12/21 250 lb 9.6 oz (113.7 kg)  ? ? ?Objective:  ?Physical Exam ?Constitutional:   ?   General: He is not in acute distress. ?   Appearance: Normal appearance. He is obese.  ?Cardiovascular:  ?   Rate and Rhythm: Normal rate and regular rhythm.  ?   Pulses: Normal pulses.  ?   Heart sounds: Normal heart sounds. No murmur heard. ?Pulmonary:  ?   Effort: Pulmonary effort is normal. No respiratory distress.  ?   Breath sounds: Normal breath sounds. No wheezing.  ?Skin: ?   General: Skin is warm.  ?   Capillary Refill: Capillary refill takes less than 2 seconds.  ?Neurological:  ?   General: No focal deficit present.  ?   Mental Status: He is alert and oriented to person, place, and time.  ?   Cranial Nerves: No cranial nerve deficit.  ?   Motor: No weakness.  ?Psychiatric:     ?   Mood and Affect: Mood normal.     ?   Behavior: Behavior normal.     ?   Thought Content: Thought content normal.     ?   Judgment: Judgment normal.  ?  ? ?   ?Assessment And Plan:  ?    ?1. Type 2 diabetes mellitus with other circulatory complication, without long-term current use of insulin ([place]) ?Comments: HgbA1c had increased at last visit, confirmed he is to take metformin 2 times a day.  ?- Hemoglobin A1c ?- BMP8+EGFR ?- Ambulatory referral to Ophthalmology ? ?2. Mixed hyperlipidemia ?Comments: Continue statin, tolerating well  ?- BMP8+EGFR ?- Lipid panel ? ?3. Pulmonary hypertension ([place]) ?Comments: Stable, continue current medications ? ?4. Class 3 severe obesity due to excess calories with serious comorbidity and body mass index (BMI) of 40.0 to 44.9 in adult [facility]) ? He is encouraged to initially strive for BMI less than 30 to decrease cardiac risk. He  is advised to exercise no less than 150 minutes per week.  ? ?5. Encounter for screening colonoscopy ?According to USPTF Colorectal cancer Screening guidelines. Colonoscopy is recommended every 10 years, starting at age 62years. ?Will refer to GI for colon cancer screening. ?- Ambulatory referral to Gastroenterology ? ? ? ? ?Patient was given opportunity to ask questions. Patient verbalized understanding of the plan and was able to repeat key elements of the plan. All questions were answered to their satisfaction.  ?Mark Brine, FNP  ? ?I, Mark Brine, FNP, have reviewed all documentation for this visit. The documentation on 01/05/22 for the exam, diagnosis, procedures, and orders are all accurate and complete.  ? ?IF YOU HAVE BEEN REFERRED TO A SPECIALIST, IT MAY TAKE 1-2 WEEKS TO SCHEDULE/PROCESS THE REFERRAL. IF YOU HAVE NOT HEARD FROM US/SPECIALIST IN TWO WEEKS, PLEASE GIVE [place] A CALL AT [phone] X [phone].  ? ?THE PATIENT IS ENCOURAGED TO PRACTICE SOCIAL DISTANCING DUE TO THE COVID-19 PANDEMIC.   ?

## 2022-01-05 NOTE — Patient Instructions (Signed)

## 2022-01-06 ENCOUNTER — Encounter (INDEPENDENT_AMBULATORY_CARE_PROVIDER_SITE_OTHER): Payer: Self-pay | Admitting: *Deleted

## 2022-01-06 LAB — LIPID PANEL
Chol/HDL Ratio: 2.9 ratio (ref 0.0–5.0)
Cholesterol, Total: 160 mg/dL (ref 100–199)
HDL: 56 mg/dL (ref >39)
LDL Chol Calc (NIH): 86 mg/dL (ref 0–99)
Triglycerides: 99 mg/dL (ref 0–149)
VLDL Cholesterol Cal: 18 mg/dL (ref 5–40)

## 2022-01-06 LAB — HEMOGLOBIN A1C
Est. average glucose Bld gHb Est-mCnc: 166 mg/dL
Hgb A1c MFr Bld: 7.4 % — ABNORMAL HIGH (ref 4.8–5.6)

## 2022-01-06 LAB — BMP8+EGFR
BUN/Creatinine Ratio: 10 (ref 10–24)
BUN: 14 mg/dL (ref 8–27)
CO2: 32 mmol/L — ABNORMAL HIGH (ref 20–29)
Calcium: 9.6 mg/dL (ref 8.6–10.2)
Chloride: 101 mmol/L (ref 96–106)
Creatinine, Ser: 1.37 mg/dL — ABNORMAL HIGH (ref 0.76–1.27)
Glucose: 110 mg/dL — ABNORMAL HIGH (ref 70–99)
Potassium: 4 mmol/L (ref 3.5–5.2)
Sodium: 145 mmol/L — ABNORMAL HIGH (ref 134–144)
eGFR: 58 mL/min/{1.73_m2} — ABNORMAL LOW (ref >59)

## 2022-01-23 ENCOUNTER — Ambulatory Visit (HOSPITAL_COMMUNITY)
Admission: EM | Admit: 2022-01-23 | Discharge: 2022-01-23 | Disposition: A | Discharge status: Home / Self Care | Payer: Medicare HMO | Attending: Physician Assistant | Admitting: Physician Assistant

## 2022-01-23 ENCOUNTER — Encounter (HOSPITAL_COMMUNITY): Payer: Self-pay

## 2022-01-23 DIAGNOSIS — M25531 Pain in right wrist: Secondary | ICD-10-CM | POA: Diagnosis not present

## 2022-01-23 DIAGNOSIS — M109 Gout, unspecified: Secondary | ICD-10-CM

## 2022-01-23 DIAGNOSIS — M1990 Unspecified osteoarthritis, unspecified site: Secondary | ICD-10-CM

## 2022-01-23 LAB — URIC ACID: Uric Acid, Serum: 8.7 mg/dL — ABNORMAL HIGH (ref 3.7–8.6)

## 2022-01-23 MED ORDER — IBUPROFEN 800 MG PO TABS: 800.0000 mg | ORAL_TABLET | Freq: Three times a day (TID) | ORAL | 0 refills | Status: DC

## 2022-01-23 MED ORDER — PREDNISONE 10 MG PO TABS: 20.0000 mg | ORAL_TABLET | Freq: Two times a day (BID) | ORAL | 0 refills | Status: DC

## 2022-01-23 NOTE — ED Provider Notes (Signed)
[facility]    CSN: [id number] Arrival date & time: 01/23/22  1207      History   Chief Complaint Chief Complaint  Patient presents with   Hand Pain    HPI Mark [name] is a 61 y.o. male.   61 year old male presents with right wrist pain patient relates for the past 2 days he has been having progressive right wrist pain, swelling.  Patient relates he did not do anything to his wrist or his hand, he does not recall any type of injury, he did not drop anything on his hand, he has not been bitten by any type of insects patient relates that he he started with the wrist pain 2 days ago since it has been increasing with redness and swelling around the wrist and the hand area.  Patient relates the pain on a scale of 1-10 is a 9, and he has limited motion of his hand and wrist.  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  Patient relates he has not changed his barbering routine.  Patient does relate that he had a similar episode occur in the left big toe, he indicates he got a shot of steroids when that occurred and that helped resolve his symptoms.  Patient does not recall if he was actually diagnosed with gout at that time or not.  Patient denies any fever or chills.   Hand Pain   Past Medical History:  Diagnosis Date   Abnormal lung function test    a. Reported to possibly be COPD, but per pulm note: "Possible COPD - PFT was more suggestive of restrictive defect and diffusion defect likely from obesity and CHF"   Chronic diastolic heart failure ([facility]) 05/07/2018   CKD (chronic kidney disease), stage III ([facility])    Diabetes mellitus type 2 in obese ([facility])    Elevated troponin 05/07/2018   Hearing impaired    Hyperlipidemia    Hypertension    LBBB (left bundle branch block)    LVH (left ventricular hypertrophy)    Mild aortic stenosis    Mild pulmonary hypertension ([facility])    Morbid obesity ([facility])    Sleep apnea     Patient Active Problem List   Diagnosis Date  Noted   Thyroid nodule 01/15/2020   PAD (peripheral artery disease) (HCC) 05/28/2019   Type 2 diabetes mellitus with hyperglycemia, without long-term current use of insulin ([facility]) 09/26/2018   BMI 37.0-37.9, adult 09/26/2018   Chronic diastolic heart failure (H[facility]) 05/07/2018   CKD (chronic kidney disease), stage III ([facility]) 05/07/2018   Cardiomegaly    Encntr for general adult medical exam w/o abnormal findings 04/25/2018   Hearing loss 04/25/2018   LBBB (left bundle branch block) 04/25/2018   Hyperlipidemia 04/25/2018   Other long term (current) drug therapy 04/25/2018   Other specified disorders of pigmentation 04/25/2018   Personal history of noncompliance with medical treatment, presenting hazards to health 04/25/2018   Sensorineural hearing loss (SNHL) of left ear 06/14/2017   Essential hypertension 06/05/2013   Pure hypercholesterolemia 06/05/2013   Vitamin D deficiency 06/05/2013   Corns and callosity 11/03/2010    Past Surgical History:  Procedure Laterality Date   BACK SURGERY     TRACHEOSTOMY         Home Medications    Prior to Admission medications   Medication Sig Start Date End Date Taking? Authorizing Provider  ibuprofen (ADVIL) 800 MG tablet Take 1 tablet (800 mg total) by mouth 3 (three)  times daily. 01/23/22  Yes Ellsworth LennoxJames, Joaopedro Eschbach, PA-C  predniSONE (DELTASONE) 10 MG tablet Take 2 tablets (20 mg total) by mouth 2 (two) times daily with a meal. 01/23/22  Yes Ellsworth LennoxJames, Makiyla Linch, PA-C  atorvastatin (LIPITOR) 20 MG tablet TAKE 1 TABLET BY MOUTH EVERY DAY 01/05/22   Arnette FeltsMoore, Janece, FNP  carvedilol (COREG) 6.25 MG tablet TAKE 1 TABLET BY MOUTH TWICE A DAY WITH FOOD 10/23/21   Arnette FeltsMoore, Janece, FNP  colchicine 0.6 MG tablet Take 1 tablet (0.6 mg total) by mouth daily. 06/20/18   Arnette FeltsMoore, Janece, FNP  gabapentin (NEURONTIN) 100 MG capsule TAKE 1 CAPSULE (100 MG TOTAL) BY MOUTH AT BEDTIME. 07/18/18   Vivi BarrackWagoner, Matthew R, DPM  losartan (COZAAR) 25 MG tablet TAKE 1 TABLET (25 MG TOTAL) BY MOUTH  DAILY. PATIENT NEEDS APPT BEFORE 30 DAY SUPPLY ENDS. 01/05/22   Arnette FeltsMoore, Janece, FNP  meloxicam (MOBIC) 7.5 MG tablet Take 7.5 mg by mouth daily. 01/07/20   [provider]  metFORMIN (GLUCOPHAGE) 500 MG tablet TAKE 1 TABLET BY MOUTH TWICE A DAY WITH BREAKFAST AND DINNER 01/05/22   Arnette FeltsMoore, Janece, FNP  torsemide (DEMADEX) 20 MG tablet TAKE 2 TABLETS (40 MG TOTAL) BY MOUTH DAILY. 01/05/22   Arnette FeltsMoore, Janece, FNP    Family History Family History  Problem Relation Age of Onset   Hypertension Mother    Hypertension Father     Social History Social History   Tobacco Use   Smoking status: Former    Years: 1.00    Types: Cigarettes    Quit date: 1999    Years since quitting: 24.4   Smokeless tobacco: Never   Tobacco comments:    1 pack would last one month--08/01/18  Vaping Use   Vaping Use: Never used  Substance Use Topics   Alcohol use: No   Drug use: No     Allergies   Patient has no known allergies.   Review of Systems Review of Systems  Musculoskeletal:  Positive for joint swelling (right wrist).    Physical Exam Triage Vital Signs ED Triage Vitals [01/23/22 1325]  Enc Vitals Group     BP (!) 195/109     Pulse Rate 75     Resp 17     Temp 98 F (36.7 C)     Temp src      SpO2 95 %     Weight      Height      Head Circumference      Peak Flow      Pain Score 8     Pain Loc      Pain Edu?      Excl. in GC?    No data found.  Updated Vital Signs BP (!) 195/109   Pulse 75   Temp 98 F (36.7 C)   Resp 17   SpO2 95%   Visual Acuity Right Eye Distance:   Left Eye Distance:   Bilateral Distance:    Right Eye Near:   Left Eye Near:    Bilateral Near:     Physical Exam Constitutional:      Appearance: Normal appearance.  Musculoskeletal:     Right wrist: Swelling and tenderness present.     Comments: Right wrist: 1+ swelling and localized redness noted at the wrist, pain is present on palpation, limited range of motion with pain on attempted flexion  extension and rotation. Right hand: Mild swelling of the posterior aspect of the hand, no redness, range of motion of  all digits are normal, capillary refill of digits are normal. There are no bite sites or open wound areas of the wrist or the hand.  Neurological:     Mental Status: He is alert.     UC Treatments / Results  Labs (all labs ordered are listed, but only abnormal results are displayed) Labs Reviewed  URIC ACID    EKG   Radiology No results found.  Procedures Procedures (including critical care time)  Medications Ordered in UC Medications - No data to display  Initial Impression / Assessment and Plan / UC Course  I have reviewed the triage vital signs and the nursing notes.  Pertinent labs & imaging results that were available during my care of the patient were reviewed by me and considered in my medical decision making (see chart for details).    Plan: 1.  Uric acid level is pending 2. advised patient to take ibuprofen 800 mg 1 every 8 hours with food, take the prednisone 20 mg twice daily for the next 4 to 5 days. 3.  Patient advised to use ice to the area as tolerated frequently throughout the day.  To help reduce the pain. 4.  Patient advised to follow-up with PCP or return to urgent care if symptoms fail to improve. Final Clinical Impressions(s) / UC Diagnoses   Final diagnoses:  Right wrist pain  Inflammatory arthritis  Acute gout of right wrist, unspecified cause     Discharge Instructions      Advised to continue icing the area to reduce the pain, 10 minutes on 20 minutes off, 3-4 times a day. Advised to take the medicine as directed. Advised to check MyChart in the next 48 hours to review lab results. Advised to remove the Ace wrap while icing the wrist.    ED Prescriptions     Medication Sig Dispense Auth. Provider   predniSONE (DELTASONE) 10 MG tablet Take 2 tablets (20 mg total) by mouth 2 (two) times daily with a meal. 20 tablet  Ellsworth Lennox, PA-C   ibuprofen (ADVIL) 800 MG tablet Take 1 tablet (800 mg total) by mouth 3 (three) times daily. 21 tablet Ellsworth Lennox, PA-C      PDMP not reviewed this encounter.   Ellsworth Lennox, PA-C 01/23/22 1357

## 2022-01-23 NOTE — Discharge Instructions (Addendum)
Advised to continue icing the area to reduce the pain, 10 minutes on 20 minutes off, 3-4 times a day. Advised to take the medicine as directed. Advised to check MyChart in the next 48 hours to review lab results. Advised to remove the Ace wrap while icing the wrist.

## 2022-01-23 NOTE — ED Triage Notes (Signed)
Pt presents with complaints of hand pain and swelling that started yesterday. Denies any injury.

## 2022-01-28 ENCOUNTER — Other Ambulatory Visit: Payer: Self-pay | Admitting: Nurse Practitioner

## 2022-02-27 ENCOUNTER — Other Ambulatory Visit: Payer: Self-pay | Admitting: Nurse Practitioner

## 2022-03-09 ENCOUNTER — Other Ambulatory Visit: Payer: Self-pay | Admitting: Nurse Practitioner

## 2022-03-23 ENCOUNTER — Other Ambulatory Visit: Payer: Self-pay | Admitting: Nurse Practitioner

## 2022-03-23 DIAGNOSIS — E1159 Type 2 diabetes mellitus with other circulatory complications: Secondary | ICD-10-CM

## 2022-03-23 MED ORDER — METFORMIN HCL 1000 MG PO TABS: 1000.0000 mg | ORAL_TABLET | Freq: Two times a day (BID) | ORAL | 1 refills | Status: DC

## 2022-03-24 ENCOUNTER — Other Ambulatory Visit: Payer: Self-pay | Admitting: Nurse Practitioner

## 2022-04-03 ENCOUNTER — Other Ambulatory Visit: Payer: Self-pay | Admitting: Nurse Practitioner

## 2022-04-08 ENCOUNTER — Ambulatory Visit: Payer: Medicare HMO | Admitting: Nurse Practitioner

## 2022-04-08 ENCOUNTER — Telehealth: Payer: Self-pay

## 2022-04-08 ENCOUNTER — Ambulatory Visit (INDEPENDENT_AMBULATORY_CARE_PROVIDER_SITE_OTHER): Payer: Medicare HMO

## 2022-04-08 VITALS — BP 130/90 | HR 73 | Temp 98.4°F | Ht 68.8 in | Wt 264.2 lb

## 2022-04-08 DIAGNOSIS — Z0000 Encounter for general adult medical examination without abnormal findings: Secondary | ICD-10-CM

## 2022-04-08 DIAGNOSIS — E1165 Type 2 diabetes mellitus with hyperglycemia: Secondary | ICD-10-CM | POA: Diagnosis not present

## 2022-04-08 LAB — POCT URINALYSIS DIPSTICK
Bilirubin, UA: NEGATIVE
Glucose, UA: NEGATIVE
Ketones, UA: NEGATIVE
Leukocytes, UA: NEGATIVE
Nitrite, UA: NEGATIVE
Protein, UA: NEGATIVE
Spec Grav, UA: 1.02 (ref 1.010–1.025)
Urobilinogen, UA: 0.2 E.U./dL
pH, UA: 7 (ref 5.0–8.0)

## 2022-04-08 NOTE — Addendum Note (Signed)
Addended by: Elisha Ponder E on: 04/08/2022 11:25 AM   Modules accepted: Orders

## 2022-04-08 NOTE — Patient Instructions (Signed)
Mark [name] , Thank you for taking time to come for your Medicare Wellness Visit. I appreciate your ongoing commitment to your health goals. Please review the following plan we discussed and let me know if I can assist you in the future.   Screening recommendations/referrals: Colonoscopy: due Recommended yearly ophthalmology/optometry visit for glaucoma screening and checkup Recommended yearly dental visit for hygiene and checkup  Vaccinations: Influenza vaccine: due Pneumococcal vaccine: n/a Tdap vaccine: completed 10/16/2012, due 10/16/2022 Shingles vaccine: discussed   Covid-19: 06/18/2021, 08/20/2020, 02/22/2020, 02/01/2020  Advanced directives: Advance directive discussed with you today. Even though you declined this today please call our office should you change your mind and we can give you the proper paperwork for you to fill out.  Conditions/risks identified: none  Next appointment: Follow up in one year for your annual wellness visit   Preventive Care 40-64 Years, Male Preventive care refers to lifestyle choices and visits with your health care provider that can promote health and wellness. What does preventive care include? A yearly physical exam. This is also called an annual well check. Dental exams once or twice a year. Routine eye exams. Ask your health care provider how often you should have your eyes checked. Personal lifestyle choices, including: Daily care of your teeth and gums. Regular physical activity. Eating a healthy diet. Avoiding tobacco and drug use. Limiting alcohol use. Practicing safe sex. Taking low-dose aspirin every day starting at age 23. What happens during an annual well check? The services and screenings done by your health care provider during your annual well check will depend on your age, overall health, lifestyle risk factors, and family history of disease. Counseling  Your health care provider may ask you questions about your: Alcohol  use. Tobacco use. Drug use. Emotional well-being. Home and relationship well-being. Sexual activity. Eating habits. Work and work Astronomer. Screening  You may have the following tests or measurements: Height, weight, and BMI. Blood pressure. Lipid and cholesterol levels. These may be checked every 5 years, or more frequently if you are over 29 years old. Skin check. Lung cancer screening. You may have this screening every year starting at age 41 if you have a 30-pack-year history of smoking and currently smoke or have quit within the past 15 years. Fecal occult blood test (FOBT) of the stool. You may have this test every year starting at age 103. Flexible sigmoidoscopy or colonoscopy. You may have a sigmoidoscopy every 5 years or a colonoscopy every 10 years starting at age 82. Prostate cancer screening. Recommendations will vary depending on your family history and other risks. Hepatitis C blood test. Hepatitis B blood test. Sexually transmitted disease (STD) testing. Diabetes screening. This is done by checking your blood sugar (glucose) after you have not eaten for a while (fasting). You may have this done every 1-3 years. Discuss your test results, treatment options, and if necessary, the need for more tests with your health care provider. Vaccines  Your health care provider may recommend certain vaccines, such as: Influenza vaccine. This is recommended every year. Tetanus, diphtheria, and acellular pertussis (Tdap, Td) vaccine. You may need a Td booster every 10 years. Zoster vaccine. You may need this after age 12. Pneumococcal 13-valent conjugate (PCV13) vaccine. You may need this if you have certain conditions and have not been vaccinated. Pneumococcal polysaccharide (PPSV23) vaccine. You may need one or two doses if you smoke cigarettes or if you have certain conditions. Talk to your health care provider about which screenings and  vaccines you need and how often you need  them. This information is not intended to replace advice given to you by your health care provider. Make sure you discuss any questions you have with your health care provider. Document Released: 09/13/2015 Document Revised: 05/06/2016 Document Reviewed: 06/18/2015 Elsevier Interactive Patient Education  2017 [employer] Prevention in the Home Falls can cause injuries. They can happen to people of all ages. There are many things you can do to make your home safe and to help prevent falls. What can I do on the outside of my home? Regularly fix the edges of walkways and driveways and fix any cracks. Remove anything that might make you trip as you walk through a door, such as a raised step or threshold. Trim any bushes or trees on the path to your home. Use bright outdoor lighting. Clear any walking paths of anything that might make someone trip, such as rocks or tools. Regularly check to see if handrails are loose or broken. Make sure that both sides of any steps have handrails. Any raised decks and porches should have guardrails on the edges. Have any leaves, snow, or ice cleared regularly. Use sand or salt on walking paths during winter. Clean up any spills in your garage right away. This includes oil or grease spills. What can I do in the bathroom? Use night lights. Install grab bars by the toilet and in the tub and shower. Do not use towel bars as grab bars. Use non-skid mats or decals in the tub or shower. If you need to sit down in the shower, use a plastic, non-slip stool. Keep the floor dry. Clean up any water that spills on the floor as soon as it happens. Remove soap buildup in the tub or shower regularly. Attach bath mats securely with double-sided non-slip rug tape. Do not have throw rugs and other things on the floor that can make you trip. What can I do in the bedroom? Use night lights. Make sure that you have a light by your bed that is easy to reach. Do not use  any sheets or blankets that are too big for your bed. They should not hang down onto the floor. Have a firm chair that has side arms. You can use this for support while you get dressed. Do not have throw rugs and other things on the floor that can make you trip. What can I do in the kitchen? Clean up any spills right away. Avoid walking on wet floors. Keep items that you use a lot in easy-to-reach places. If you need to reach something above you, use a strong step stool that has a grab bar. Keep electrical cords out of the way. Do not use floor polish or wax that makes floors slippery. If you must use wax, use non-skid floor wax. Do not have throw rugs and other things on the floor that can make you trip. What can I do with my stairs? Do not leave any items on the stairs. Make sure that there are handrails on both sides of the stairs and use them. Fix handrails that are broken or loose. Make sure that handrails are as long as the stairways. Check any carpeting to make sure that it is firmly attached to the stairs. Fix any carpet that is loose or worn. Avoid having throw rugs at the top or bottom of the stairs. If you do have throw rugs, attach them to the floor with carpet tape. Make  sure that you have a light switch at the top of the stairs and the bottom of the stairs. If you do not have them, ask someone to add them for you. What else can I do to help prevent falls? Wear shoes that: Do not have high heels. Have rubber bottoms. Are comfortable and fit you well. Are closed at the toe. Do not wear sandals. If you use a stepladder: Make sure that it is fully opened. Do not climb a closed stepladder. Make sure that both sides of the stepladder are locked into place. Ask someone to hold it for you, if possible. Clearly mark and make sure that you can see: Any grab bars or handrails. First and last steps. Where the edge of each step is. Use tools that help you move around (mobility aids)  if they are needed. These include: Canes. Walkers. Scooters. Crutches. Turn on the lights when you go into a dark area. Replace any light bulbs as soon as they burn out. Set up your furniture so you have a clear path. Avoid moving your furniture around. If any of your floors are uneven, fix them. If there are any pets around you, be aware of where they are. Review your medicines with your doctor. Some medicines can make you feel dizzy. This can increase your chance of falling. Ask your doctor what other things that you can do to help prevent falls. This information is not intended to replace advice given to you by your health care provider. Make sure you discuss any questions you have with your health care provider. Document Released: 06/13/2009 Document Revised: 01/23/2016 Document Reviewed: 09/21/2014 Elsevier Interactive Patient Education  2017 [employer].

## 2022-04-08 NOTE — Telephone Encounter (Signed)
   Telephone encounter was:  Unsuccessful.  04/08/2022 Name: Mark [name] MRN: [id number] DOB: Aug 02, 1961  Unsuccessful outbound call made today to assist with:   life alert  Outreach Attempt:  1st Attempt  A HIPAA compliant voice message was left requesting a return call.  Instructed patient to call back at Left a message to call back at earliest convenience. Lenard Forth Care Guide, Embedded Care Coordination [facility], Care Management  [phone] [address] [place], [place], [place] [address] Phone: [phone] Email: [email or web address]

## 2022-04-08 NOTE — Progress Notes (Signed)
Subjective:   Mark [name] is a 61 y.o. male who presents for Medicare Annual/Subsequent preventive examination.  Review of Systems     Cardiac Risk Factors include: advanced age (>62men, >18 women);diabetes mellitus;dyslipidemia;hypertension;male gender;obesity (BMI >30kg/m2)     Objective:    Today's Vitals   04/08/22 1014 04/08/22 1046  BP: (!) 150/90 (!) 130/90  Pulse: 73   Temp: 98.4 F (36.9 C)   TempSrc: Oral   SpO2: 93%   Weight: 264 lb 3.2 oz (119.8 kg)   Height: 5' 8.8" (1.748 m)    Body mass index is 39.24 kg/m.     04/08/2022   10:28 AM 03/12/2021   10:56 AM 03/07/2020   11:17 AM 12/27/2018    2:32 PM 05/07/2018    6:13 PM  Advanced Directives  Does Patient Have a Medical Advance Directive? No No No Yes No  Type of Advance Directive    Living will   Would patient like information on creating a medical advance directive? No - Patient declined  No - Patient declined  No - Patient declined    Current Medications (verified) Outpatient Encounter Medications as of 04/08/2022  Medication Sig   atorvastatin (LIPITOR) 20 MG tablet TAKE 1 TABLET BY MOUTH EVERY DAY   carvedilol (COREG) 6.25 MG tablet TAKE 1 TABLET BY MOUTH TWICE A DAY WITH FOOD   colchicine 0.6 MG tablet Take 1 tablet (0.6 mg total) by mouth daily.   gabapentin (NEURONTIN) 100 MG capsule TAKE 1 CAPSULE (100 MG TOTAL) BY MOUTH AT BEDTIME.   ibuprofen (ADVIL) 800 MG tablet Take 1 tablet (800 mg total) by mouth 3 (three) times daily.   losartan (COZAAR) 25 MG tablet TAKE 1 TABLET (25 MG TOTAL) BY MOUTH DAILY. PATIENT NEEDS APPT BEFORE 30 DAY SUPPLY ENDS.   meloxicam (MOBIC) 7.5 MG tablet Take 7.5 mg by mouth daily.   metFORMIN (GLUCOPHAGE) 1000 MG tablet Take 1 tablet (1,000 mg total) by mouth 2 (two) times daily with a meal.   predniSONE (DELTASONE) 10 MG tablet Take 2 tablets (20 mg total) by mouth 2 (two) times daily with a meal.   torsemide (DEMADEX) 20 MG tablet TAKE 2 TABLETS (40 MG TOTAL) BY MOUTH  DAILY.   No facility-administered encounter medications on file as of 04/08/2022.    Allergies (verified) Patient has no known allergies.   History: Past Medical History:  Diagnosis Date   Abnormal lung function test    a. Reported to possibly be COPD, but per pulm note: "Possible COPD - PFT was more suggestive of restrictive defect and diffusion defect likely from obesity and CHF"   Chronic diastolic heart failure (HCC) 05/07/2018   CKD (chronic kidney disease), stage III (HCC)    Diabetes mellitus type 2 in obese (HCC)    Elevated troponin 05/07/2018   Hearing impaired    Hyperlipidemia    Hypertension    LBBB (left bundle branch block)    LVH (left ventricular hypertrophy)    Mild aortic stenosis    Mild pulmonary hypertension (HCC)    Morbid obesity (HCC)    Sleep apnea    Past Surgical History:  Procedure Laterality Date   BACK SURGERY     TRACHEOSTOMY     Family History  Problem Relation Age of Onset   Hypertension Mother    Hypertension Father    Social History   Socioeconomic History   Marital status: Married    Spouse name: Not on file   Number of  children: Not on file   Years of education: Not on file   Highest education level: Not on file  Occupational History   Occupation: employed  Tobacco Use   Smoking status: Former    Years: 1.00    Types: Cigarettes    Quit date: 1999    Years since quitting: 24.6   Smokeless tobacco: Never   Tobacco comments:    1 pack would last one month--08/01/18  Vaping Use   Vaping Use: Never used  Substance and Sexual Activity   Alcohol use: No   Drug use: No   Sexual activity: Not Currently  Other Topics Concern   Not on file  Social History Narrative   Not on file   Social Determinants of Health   Financial Resource Strain: Low Risk  (04/08/2022)   Overall Financial Resource Strain (CARDIA)    Difficulty of Paying Living Expenses: Not hard at all  Food Insecurity: No Food Insecurity (04/08/2022)   Hunger Vital  Sign    Worried About Running Out of Food in the Last Year: Never true    Ran Out of Food in the Last Year: Never true  Transportation Needs: No Transportation Needs (04/08/2022)   PRAPARE - Administrator, Civil Service (Medical): No    Lack of Transportation (Non-Medical): No  Physical Activity: Inactive (04/08/2022)   Exercise Vital Sign    Days of Exercise per Week: 0 days    Minutes of Exercise per Session: 0 min  Stress: No Stress Concern Present (04/08/2022)   [employer] of Occupational Health - Occupational Stress Questionnaire    Feeling of Stress : Not at all  Social Connections: Not on file    Tobacco Counseling Counseling given: Not Answered Tobacco comments: 1 pack would last one month--08/01/18   Clinical Intake:  Pre-visit preparation completed: Yes  Pain : No/denies pain     Nutritional Status: BMI > 30  Obese Nutritional Risks: None Diabetes: Yes  How often do you need to have someone help you when you read instructions, pamphlets, or other written materials from your doctor or pharmacy?: 1 - Never What is the last grade level you completed in school?: 12th grade  Diabetic? Yes Nutrition Risk Assessment:  Has the patient had any N/V/D within the last 2 months?  No  Does the patient have any non-healing wounds?  No  Has the patient had any unintentional weight loss or weight gain?  Yes   Diabetes:  Is the patient diabetic?  Yes  If diabetic, was a CBG obtained today?  No  Did the patient bring in their glucometer from home?  No  How often do you monitor your CBG's? When feels .   Financial Strains and Diabetes Management:  Are you having any financial strains with the device, your supplies or your medication? No .  Does the patient want to be seen by Chronic Care Management for management of their diabetes?  No  Would the patient like to be referred to a Nutritionist or for Diabetic Management?  No   Diabetic Exams:  Diabetic Eye  Exam: Overdue for diabetic eye exam. Pt has been advised about the importance in completing this exam. Patient advised to call and schedule an eye exam. Diabetic Foot Exam: Overdue, Pt has been advised about the importance in completing this exam. Pt is scheduled for diabetic foot exam on next appointment.  Interpreter Needed?: No  Information entered by :: NAllen LPN   Activities of Daily  Living    04/08/2022   10:30 AM  In your present state of health, do you have any difficulty performing the following activities:  Hearing? 1  Comment hearing aid in left ear  Vision? 0  Difficulty concentrating or making decisions? 0  Walking or climbing stairs? 0  Dressing or bathing? 0  Doing errands, shopping? 0  Preparing Food and eating ? N  Using the Toilet? N  In the past six months, have you accidently leaked urine? N  Do you have problems with loss of bowel control? N  Managing your Medications? N  Managing your Finances? N  Housekeeping or managing your Housekeeping? N    Patient Care Team: [name], FNP as PCP - General (General Practice) Quintella Reichert, MD as PCP - Cardiology (Cardiology)  Indicate any recent Medical Services you may have received from other than [facility] providers in the past year (date may be approximate).     Assessment:   This is a routine wellness examination for Jarom.  Hearing/Vision screen Vision Screening - Comments:: No regular eye exams  Dietary issues and exercise activities discussed: Current Exercise Habits: The patient does not participate in regular exercise at present   Goals Addressed             This Visit's Progress    Patient Stated       04/08/2022, wants to lose weight and stay healthy       Depression Screen    04/08/2022   10:30 AM 03/12/2021   10:56 AM 03/07/2020   11:19 AM 05/22/2019   10:37 AM 02/06/2019    9:30 AM 12/27/2018    2:33 PM 09/26/2018    9:28 AM  PHQ 2/9 Scores  PHQ - 2 Score 0 0 0 0 0 0 0  PHQ- 9 Score       0     Fall Risk    04/08/2022   10:30 AM 03/12/2021   10:56 AM 03/07/2020   11:18 AM 05/22/2019   10:37 AM 02/06/2019    9:30 AM  Fall Risk   Falls in the past year? 0 0 0 0 0  Number falls in past yr: 0      Injury with Fall? 0      Risk for fall due to : Medication side effect Medication side effect Medication side effect    Follow up Falls evaluation completed;Education provided;Falls prevention discussed Falls evaluation completed;Education provided;Falls prevention discussed Falls evaluation completed;Education provided;Falls prevention discussed      FALL RISK PREVENTION PERTAINING TO THE HOME:  Any stairs in or around the home? No  If so, are there any without handrails? N/a Home free of loose throw rugs in walkways, pet beds, electrical cords, etc? Yes  Adequate lighting in your home to reduce risk of falls? Yes   ASSISTIVE DEVICES UTILIZED TO PREVENT FALLS:  Life alert? No  Use of a cane, walker or w/c? No  Grab bars in the bathroom? No  Shower chair or bench in shower? No  Elevated toilet seat or a handicapped toilet? No   TIMED UP AND GO:  Was the test performed? No .    Gait steady and fast without use of assistive device  Cognitive Function:        04/08/2022   10:32 AM 03/12/2021   10:57 AM 03/07/2020   11:20 AM 12/27/2018    2:36 PM  6CIT Screen  What Year? 0 points 0 points 0 points 0 points  What month? 0 points 0 points 0 points 0 points  What time? 0 points 0 points 0 points 0 points  Count back from 20 0 points 2 points 0 points 0 points  Months in reverse 4 points 4 points 4 points 4 points  Repeat phrase 4 points 0 points 0 points 2 points  Total Score 8 points 6 points 4 points 6 points    Immunizations Immunization History  Administered Date(s) Administered   Influenza,inj,Quad PF,6+ Mos 06/18/2021   PFIZER(Purple Top)SARS-COV-2 Vaccination 02/01/2020, 02/22/2020, 08/20/2020   Pfizer Covid-19 Vaccine Bivalent Booster 768yrs & up 06/18/2021     TDAP status: Up to date  Flu Vaccine status: Due, Education has been provided regarding the importance of this vaccine. Advised may receive this vaccine at local pharmacy or Health Dept. Aware to provide a copy of the vaccination record if obtained from local pharmacy or Health Dept. Verbalized acceptance and understanding.  Pneumococcal vaccine status: Up to date  Covid-19 vaccine status: Completed vaccines  Qualifies for Shingles Vaccine? Yes   Zostavax completed No   Shingrix Completed?: No.    Education has been provided regarding the importance of this vaccine. Patient has been advised to call insurance company to determine out of pocket expense if they have not yet received this vaccine. Advised may also receive vaccine at local pharmacy or Health Dept. Verbalized acceptance and understanding.  Screening Tests Health Maintenance  Topic Date Due   OPHTHALMOLOGY EXAM  Never done   Zoster Vaccines- Shingrix (1 of 2) Never done   COLONOSCOPY (Pts 45-2796yrs Insurance coverage will need to be confirmed)  Never done   FOOT EXAM  03/12/2022   INFLUENZA VACCINE  03/31/2022   HEMOGLOBIN A1C  07/08/2022   TETANUS/TDAP  10/16/2022   COVID-19 Vaccine  Completed   Hepatitis C Screening  Completed   HIV Screening  Completed   HPV VACCINES  Aged Out    Health Maintenance  Health Maintenance Due  Topic Date Due   OPHTHALMOLOGY EXAM  Never done   Zoster Vaccines- Shingrix (1 of 2) Never done   COLONOSCOPY (Pts 45-4096yrs Insurance coverage will need to be confirmed)  Never done   FOOT EXAM  03/12/2022   INFLUENZA VACCINE  03/31/2022    Colorectal cancer screening: decline  Lung Cancer Screening: (Low Dose CT Chest recommended if Age 52-80 years, 30 pack-year currently smoking OR have quit w/in 15years.) does not qualify.   Lung Cancer Screening Referral: no  Additional Screening:  Hepatitis C Screening: does qualify; Completed 06/20/2018  Vision Screening: Recommended annual  ophthalmology exams for early detection of glaucoma and other disorders of the eye. Is the patient up to date with their annual eye exam?  No  Who is the provider or what is the name of the office in which the patient attends annual eye exams? none If pt is not established with a provider, would they like to be referred to a provider to establish care? No .   Dental Screening: Recommended annual dental exams for proper oral hygiene  Community Resource Referral / Chronic Care Management: CRR required this visit?  No   CCM required this visit?  No      Plan:     I have personally reviewed and noted the following in the patient's chart:   Medical and social history Use of alcohol, tobacco or illicit drugs  Current medications and supplements including opioid prescriptions. Patient is not currently taking opioid prescriptions. Functional ability and status Nutritional  status Physical activity Advanced directives List of other physicians Hospitalizations, surgeries, and ER visits in previous 12 months Vitals Screenings to include cognitive, depression, and falls Referrals and appointments  In addition, I have reviewed and discussed with patient certain preventive protocols, quality metrics, and best practice recommendations. A written personalized care plan for preventive services as well as general preventive health recommendations were provided to patient.     Barb Merino, LPN   2/0/1007   Nurse Notes: none

## 2022-04-09 ENCOUNTER — Telehealth: Payer: Self-pay

## 2022-04-09 LAB — MICROALBUMIN / CREATININE URINE RATIO
Creatinine, Urine: 18 mg/dL
Microalb/Creat Ratio: 345 mg/g creat — ABNORMAL HIGH (ref 0–29)
Microalbumin, Urine: 62.1 ug/mL

## 2022-04-09 NOTE — Telephone Encounter (Signed)
   Telephone encounter was:  Unsuccessful.  04/09/2022 Name: Mark [name] MRN: [id number] DOB: 1961/07/27  Unsuccessful outbound call made today to assist with:   life alert  Outreach Attempt:  2nd Attempt  A HIPAA compliant voice message was left requesting a return call.  Instructed patient to call back at  earliest convenience. Lenard Forth Care Guide, Embedded Care Coordination [facility], Care Management  [phone] [address] [place], [place], [place] [address] Phone: [phone] Email: [email or web address]

## 2022-04-10 ENCOUNTER — Telehealth: Payer: Self-pay

## 2022-04-10 NOTE — Telephone Encounter (Signed)
   Telephone encounter was:  Unsuccessful.  04/10/2022 Name: WILLIES LAVIOLETTE MRN: [id number] DOB: 07/19/1961  Unsuccessful outbound call made today to assist with:   life alert  Outreach Attempt:  3rd Attempt.  Referral closed unable to contact patient. Cant leave a message   [facility] Guide, Embedded Care Coordination [facility], Care Management  [phone] [address] [place], [place], [place] [address] Phone: [phone] Email: [email or web address]

## 2022-04-16 DIAGNOSIS — I5032 Chronic diastolic (congestive) heart failure: Secondary | ICD-10-CM | POA: Diagnosis not present

## 2022-04-20 ENCOUNTER — Other Ambulatory Visit: Payer: Self-pay | Admitting: Nurse Practitioner

## 2022-04-22 ENCOUNTER — Other Ambulatory Visit: Payer: Self-pay | Admitting: Nurse Practitioner

## 2022-04-23 ENCOUNTER — Ambulatory Visit: Payer: Medicare HMO | Admitting: Podiatry

## 2022-04-24 ENCOUNTER — Ambulatory Visit: Payer: Medicare HMO | Admitting: Podiatry

## 2022-04-27 ENCOUNTER — Telehealth: Payer: Self-pay | Admitting: Podiatry

## 2022-04-27 NOTE — Telephone Encounter (Signed)
Pt left message on 8.25.23  @ 244pm to cxl his appt for 8.30 @ 956..  I have cxled it and left message for pt to call if needs to r/s

## 2022-04-29 ENCOUNTER — Ambulatory Visit: Payer: Medicare HMO | Admitting: Podiatry

## 2022-05-06 ENCOUNTER — Ambulatory Visit (HOSPITAL_COMMUNITY)
Admission: EM | Admit: 2022-05-06 | Discharge: 2022-05-06 | Disposition: A | Discharge status: Home / Self Care | Payer: Medicare HMO | Attending: Physician Assistant | Admitting: Physician Assistant

## 2022-05-06 DIAGNOSIS — M79642 Pain in left hand: Secondary | ICD-10-CM

## 2022-05-06 DIAGNOSIS — M10442 Other secondary gout, left hand: Secondary | ICD-10-CM

## 2022-05-06 MED ORDER — COLCHICINE 0.6 MG PO TABS
0.6000 mg | ORAL_TABLET | Freq: Every day | ORAL | 1 refills | Status: DC
Start: 2022-05-06 — End: 2022-08-05

## 2022-05-06 MED ORDER — IBUPROFEN 800 MG PO TABS: 800.0000 mg | ORAL_TABLET | Freq: Three times a day (TID) | ORAL | 0 refills | Status: DC

## 2022-05-06 MED ORDER — IBUPROFEN 800 MG PO TABS
800.0000 mg | ORAL_TABLET | Freq: Once | ORAL | Status: AC
  Administered 2022-05-06: 800 mg via ORAL

## 2022-05-06 MED ORDER — PREDNISONE 10 MG PO TABS: 10.0000 mg | ORAL_TABLET | Freq: Three times a day (TID) | ORAL | 0 refills | Status: DC

## 2022-05-06 MED ORDER — IBUPROFEN 800 MG PO TABS
ORAL_TABLET | ORAL | Status: AC
  Filled 2022-05-06: qty 1

## 2022-05-06 NOTE — ED Provider Notes (Signed)
[facility]    CSN: [id number] Arrival date & time: 05/06/22  0808      History   Chief Complaint Chief Complaint  Patient presents with   Insect Bite    HPI Mark [name] is a 61 y.o. male.   61 year old male presents with left hand pain.  Patient indicates for the past 2 days he has been having left pain swelling, redness, and warmth.  Patient indicates the hand hurts whenever he flexes or bends the hand and the pain is mainly located around the first distal MCP area.  Patient indicates that the area is hot and cannot stand for anyone to touch that area.  He indicates that he does not recall being bitten by any insect, and he has not had any trauma to the hand.  He does indicate he has a history of gout with the last episode being in May, 2023.  At that time the uric acid level was elevated.  He does indicate that he occasionally drinks beers and also eats cheese type products on a regular basis.  Patient denies fever or chills. Patient also indicates that he did not take his blood pressure medicine this morning that combined with how painful his hand is is causing his blood pressure to be elevated today.  He does indicate that he saw his PCP about a month ago and his blood pressure was checked and it was within a normal range as related by the patient.  He does indicate that he takes his blood pressure medicine on a regular basis daily.     Past Medical History:  Diagnosis Date   Abnormal lung function test    a. Reported to possibly be COPD, but per pulm note: "Possible COPD - PFT was more suggestive of restrictive defect and diffusion defect likely from obesity and CHF"   Chronic diastolic heart failure (HCC) 05/07/2018   CKD (chronic kidney disease), stage III (HCC)    Diabetes mellitus type 2 in obese (HCC)    Elevated troponin 05/07/2018   Hearing impaired    Hyperlipidemia    Hypertension    LBBB (left bundle branch block)    LVH (left ventricular hypertrophy)     Mild aortic stenosis    Mild pulmonary hypertension (HCC)    Morbid obesity (HCC)    Sleep apnea     Patient Active Problem List   Diagnosis Date Noted   Thyroid nodule 01/15/2020   PAD (peripheral artery disease) (HCC) 05/28/2019   Type 2 diabetes mellitus with hyperglycemia, without long-term current use of insulin (HCC) 09/26/2018   BMI 37.0-37.9, adult 09/26/2018   Chronic diastolic heart failure (HCC) 05/07/2018   CKD (chronic kidney disease), stage III (HCC) 05/07/2018   Cardiomegaly    Encntr for general adult medical exam w/o abnormal findings 04/25/2018   Hearing loss 04/25/2018   LBBB (left bundle branch block) 04/25/2018   Hyperlipidemia 04/25/2018   Other long term (current) drug therapy 04/25/2018   Other specified disorders of pigmentation 04/25/2018   Personal history of noncompliance with medical treatment, presenting hazards to health 04/25/2018   Sensorineural hearing loss (SNHL) of left ear 06/14/2017   Essential hypertension 06/05/2013   Pure hypercholesterolemia 06/05/2013   Vitamin D deficiency 06/05/2013   Corns and callosity 11/03/2010    Past Surgical History:  Procedure Laterality Date   BACK SURGERY     TRACHEOSTOMY         Home Medications    Prior to Admission medications  Medication Sig Start Date End Date Taking? Authorizing Provider  atorvastatin (LIPITOR) 20 MG tablet TAKE 1 TABLET BY MOUTH EVERY DAY 04/03/22   [name], FNP  carvedilol (COREG) 6.25 MG tablet TAKE 1 TABLET BY MOUTH TWICE A DAY WITH FOOD 04/22/22   [name], FNP  colchicine 0.6 MG tablet Take 1 tablet (0.6 mg total) by mouth daily. 05/06/22   Ellsworth Lennox, PA-C  gabapentin (NEURONTIN) 100 MG capsule TAKE 1 CAPSULE (100 MG TOTAL) BY MOUTH AT BEDTIME. 07/18/18   Vivi Barrack, DPM  ibuprofen (ADVIL) 800 MG tablet Take 1 tablet (800 mg total) by mouth 3 (three) times daily. 05/06/22   Ellsworth Lennox, PA-C  losartan (COZAAR) 25 MG tablet TAKE 1 TABLET (25 MG TOTAL)  BY MOUTH DAILY. PATIENT NEEDS APPT BEFORE 30 DAY SUPPLY ENDS. 04/20/22   [name], FNP  meloxicam (MOBIC) 7.5 MG tablet Take 7.5 mg by mouth daily. 01/07/20   [provider]  metFORMIN (GLUCOPHAGE) 1000 MG tablet Take 1 tablet (1,000 mg total) by mouth 2 (two) times daily with a meal. 03/23/22   [name], FNP  predniSONE (DELTASONE) 10 MG tablet Take 1 tablet (10 mg total) by mouth in the morning, at noon, and at bedtime. 05/06/22   Ellsworth Lennox, PA-C  torsemide (DEMADEX) 20 MG tablet TAKE 2 TABLETS (40 MG TOTAL) BY MOUTH DAILY. 01/05/22   [name], FNP    Family History Family History  Problem Relation Age of Onset   Hypertension Mother    Hypertension Father     Social History Social History   Tobacco Use   Smoking status: Former    Years: 1.00    Types: Cigarettes    Quit date: 1999    Years since quitting: 24.6   Smokeless tobacco: Never   Tobacco comments:    1 pack would last one month--08/01/18  Vaping Use   Vaping Use: Never used  Substance Use Topics   Alcohol use: No   Drug use: No     Allergies   Patient has no known allergies.   Review of Systems Review of Systems  Musculoskeletal:  Positive for joint swelling (left hand 1st digit).     Physical Exam Triage Vital Signs ED Triage Vitals  Enc Vitals Group     BP 05/06/22 0854 (!) 179/121     Pulse Rate 05/06/22 0854 73     Resp 05/06/22 0854 12     Temp 05/06/22 0854 97.8 F (36.6 C)     Temp Source 05/06/22 0854 Oral     SpO2 05/06/22 0854 98 %     Weight 05/06/22 0852 261 lb (118.4 kg)     Height --      Head Circumference --      Peak Flow --      Pain Score 05/06/22 0852 7     Pain Loc --      Pain Edu? --      Excl. in GC? --    No data found.  Updated Vital Signs BP (!) 179/121 (BP Location: Right Arm)   Pulse 73   Temp 97.8 F (36.6 C) (Oral)   Resp 12   Wt 261 lb (118.4 kg)   SpO2 98%   BMI 38.77 kg/m   Visual Acuity Right Eye Distance:   Left Eye  Distance:   Bilateral Distance:    Right Eye Near:   Left Eye Near:    Bilateral Near:     Physical  Exam Constitutional:      Appearance: Normal appearance.  Musculoskeletal:       Arms:     Comments: Left hand: There is 1+ swelling that is confined to the distal first MCP area of the index finger.  Pain with flexion and extension.  The area is warm to the touch, and the redness is localized to the joint space.  There is no evidence of insect bite around the area of redness.  Neurological:     Mental Status: He is alert.      UC Treatments / Results  Labs (all labs ordered are listed, but only abnormal results are displayed) Labs Reviewed - No data to display  EKG   Radiology No results found.  Procedures Procedures (including critical care time)  Medications Ordered in UC Medications  ibuprofen (ADVIL) tablet 800 mg (has no administration in time range)    Initial Impression / Assessment and Plan / UC Course  I have reviewed the triage vital signs and the nursing notes.  Pertinent labs & imaging results that were available during my care of the patient were reviewed by me and considered in my medical decision making (see chart for details).    Plan: 1.  Advised patient to take his blood pressure medicine as soon as gets home. 2.  Advised patient take ibuprofen 800 mg every 8 hours with food to help control the swelling and pain. 3.  Advised patient to take prednisone 10 mg 1 3 times a day for only 5 days to help control the swelling. 4.  Advised patient to take Kolls in the daily for 5 days only to help decrease the uric acid level. 5.  Advised patient to watch diet to try to avoid foods that may cause elevations in the uric acid level. 6.  Advised patient follow-up PCP or return to urgent care if symptoms fail to improve. Final Clinical Impressions(s) / UC Diagnoses   Final diagnoses:  Left hand pain  Other secondary acute gout of left hand     Discharge  Instructions      Advised to take the cholestyramine as directed 1 tablet daily till completed to reduce the uric acid level. Advised to take ibuprofen 800 mg 1 every 8 hours with food to help reduce the acute pain and swelling of the left hand caused by the gout. Advised take prednisone 10 mg 1 3 times a day for 5 days only to help reduce the acute gouty flare. Advised to continue taking blood pressure medicine to control the hypertension. Advised to follow-up with PCP or return to urgent care if symptoms fail to improve.    ED Prescriptions     Medication Sig Dispense Auth. Provider   colchicine 0.6 MG tablet Take 1 tablet (0.6 mg total) by mouth daily. 5 tablet Ellsworth Lennox, PA-C   ibuprofen (ADVIL) 800 MG tablet Take 1 tablet (800 mg total) by mouth 3 (three) times daily. 21 tablet Ellsworth Lennox, PA-C   predniSONE (DELTASONE) 10 MG tablet Take 1 tablet (10 mg total) by mouth in the morning, at noon, and at bedtime. 15 tablet Ellsworth Lennox, PA-C      PDMP not reviewed this encounter.   Ellsworth Lennox, PA-C 05/06/22 [phone]

## 2022-05-06 NOTE — Discharge Instructions (Addendum)
Advised to take the cholestyramine as directed 1 tablet daily till completed to reduce the uric acid level. Advised to take ibuprofen 800 mg 1 every 8 hours with food to help reduce the acute pain and swelling of the left hand caused by the gout. Advised take prednisone 10 mg 1 3 times a day for 5 days only to help reduce the acute gouty flare. Advised to continue taking blood pressure medicine to control the hypertension. Advised to follow-up with PCP or return to urgent care if symptoms fail to improve.

## 2022-05-06 NOTE — ED Triage Notes (Signed)
Pt is here left hand swollen possible insect bite  And pain x2days

## 2022-05-13 ENCOUNTER — Ambulatory Visit: Payer: Medicare HMO | Admitting: Podiatry

## 2022-05-17 ENCOUNTER — Other Ambulatory Visit: Payer: Self-pay | Admitting: Nurse Practitioner

## 2022-05-25 ENCOUNTER — Other Ambulatory Visit: Payer: Self-pay | Admitting: Nurse Practitioner

## 2022-06-08 ENCOUNTER — Ambulatory Visit: Payer: Medicare HMO | Admitting: Nurse Practitioner

## 2022-07-07 ENCOUNTER — Other Ambulatory Visit: Payer: Self-pay | Admitting: Nurse Practitioner

## 2022-07-27 ENCOUNTER — Ambulatory Visit: Payer: Medicare HMO | Admitting: Nurse Practitioner

## 2022-08-05 ENCOUNTER — Ambulatory Visit (HOSPITAL_COMMUNITY)
Admission: EM | Admit: 2022-08-05 | Discharge: 2022-08-05 | Disposition: A | Discharge status: Home / Self Care | Payer: Medicare HMO | Attending: Physician Assistant | Admitting: Physician Assistant

## 2022-08-05 ENCOUNTER — Encounter (HOSPITAL_COMMUNITY): Payer: Self-pay | Admitting: Emergency Medicine

## 2022-08-05 DIAGNOSIS — M109 Gout, unspecified: Secondary | ICD-10-CM

## 2022-08-05 MED ORDER — COLCHICINE 0.6 MG PO TABS: 0.6000 mg | ORAL_TABLET | Freq: Every day | ORAL | 1 refills | Status: DC

## 2022-08-05 MED ORDER — IBUPROFEN 800 MG PO TABS: 800.0000 mg | ORAL_TABLET | Freq: Three times a day (TID) | ORAL | 0 refills | Status: DC

## 2022-08-05 MED ORDER — PREDNISONE 10 MG PO TABS: 10.0000 mg | ORAL_TABLET | Freq: Three times a day (TID) | ORAL | 0 refills | Status: DC

## 2022-08-05 NOTE — ED Triage Notes (Signed)
Pt c/o left foot swelling that is intermittent for about 3 weeks. Will have a sharp pain every once in while. Swelling in left lower leg as well  Hx gout.

## 2022-08-05 NOTE — Discharge Instructions (Signed)
Advised to make sure to avoid organ meats and shellfish as this contributes to episodes of gout occurring. Advised take the ibuprofen 800 mg every 8 hours with food to help reduce pain and swelling. Advised take prednisone 10 mg 3 times a day for 5 days only to help reduce the inflammatory response. Advised take colchicine name 1 tablet daily for 5 days only to help reduce the uric acid level. Advised to follow-up PCP or return to urgent care if symptoms fail to improve.

## 2022-08-05 NOTE — ED Provider Notes (Signed)
[facility]    CSN: [id number] Arrival date & time: 08/05/22  1302      History   Chief Complaint Chief Complaint  Patient presents with   Foot Swelling    HPI Mark [name] is a 61 y.o. male.   61 year old male presents with left foot pain.  Patient indicates he has a history of having gout.  Patient relates for the past 3 weeks he has been having some intermittent but progressive pain in the left foot big toe joint area.  He indicates the pain is worse when he tends to stand and bear weight and walk.  He also indicates that he has been having some swelling of the left foot which has also affected the ankle and lower part of the left leg.  Patient denies any fever or chills.  Patient denies any trauma to the area.  Patient indicates that the right leg and foot are fine that he does not have any pain or swelling.  Patient does indicate that the medicine he took last time did resolve the gout in his finger.  He is concerned and just did not know exactly what is causing his gout to occur.     Past Medical History:  Diagnosis Date   Abnormal lung function test    a. Reported to possibly be COPD, but per pulm note: "Possible COPD - PFT was more suggestive of restrictive defect and diffusion defect likely from obesity and CHF"   Chronic diastolic heart failure (HCC) 05/07/2018   CKD (chronic kidney disease), stage III (HCC)    Diabetes mellitus type 2 in obese (HCC)    Elevated troponin 05/07/2018   Hearing impaired    Hyperlipidemia    Hypertension    LBBB (left bundle branch block)    LVH (left ventricular hypertrophy)    Mild aortic stenosis    Mild pulmonary hypertension (HCC)    Morbid obesity (HCC)    Sleep apnea     Patient Active Problem List   Diagnosis Date Noted   Thyroid nodule 01/15/2020   PAD (peripheral artery disease) (HCC) 05/28/2019   Type 2 diabetes mellitus with hyperglycemia, without long-term current use of insulin (HCC) 09/26/2018   BMI  37.0-37.9, adult 09/26/2018   Chronic diastolic heart failure (HCC) 05/07/2018   CKD (chronic kidney disease), stage III (HCC) 05/07/2018   Cardiomegaly    Encntr for general adult medical exam w/o abnormal findings 04/25/2018   Hearing loss 04/25/2018   LBBB (left bundle branch block) 04/25/2018   Hyperlipidemia 04/25/2018   Other long term (current) drug therapy 04/25/2018   Other specified disorders of pigmentation 04/25/2018   Personal history of noncompliance with medical treatment, presenting hazards to health 04/25/2018   Sensorineural hearing loss (SNHL) of left ear 06/14/2017   Essential hypertension 06/05/2013   Pure hypercholesterolemia 06/05/2013   Vitamin D deficiency 06/05/2013   Corns and callosity 11/03/2010    Past Surgical History:  Procedure Laterality Date   BACK SURGERY     TRACHEOSTOMY         Home Medications    Prior to Admission medications   Medication Sig Start Date End Date Taking? Authorizing Provider  predniSONE (DELTASONE) 10 MG tablet Take 1 tablet (10 mg total) by mouth in the morning, at noon, and at bedtime. 08/05/22  Yes Ellsworth Lennox, PA-C  atorvastatin (LIPITOR) 20 MG tablet TAKE 1 TABLET BY MOUTH EVERY DAY 05/27/22   [name], FNP  carvedilol (COREG) 6.25 MG tablet  TAKE 1 TABLET BY MOUTH TWICE A DAY WITH FOOD 04/22/22   [name], FNP  colchicine 0.6 MG tablet Take 1 tablet (0.6 mg total) by mouth daily. 08/05/22   Ellsworth Lennox, PA-C  gabapentin (NEURONTIN) 100 MG capsule TAKE 1 CAPSULE (100 MG TOTAL) BY MOUTH AT BEDTIME. 07/18/18   Vivi Barrack, DPM  ibuprofen (ADVIL) 800 MG tablet Take 1 tablet (800 mg total) by mouth 3 (three) times daily. 08/05/22   Ellsworth Lennox, PA-C  losartan (COZAAR) 25 MG tablet TAKE 1 TABLET (25 MG TOTAL) BY MOUTH DAILY. PATIENT NEEDS APPT BEFORE 30 DAY SUPPLY ENDS. 07/07/22   [name], FNP  meloxicam (MOBIC) 7.5 MG tablet Take 7.5 mg by mouth daily. 01/07/20   [provider]  metFORMIN  (GLUCOPHAGE) 1000 MG tablet Take 1 tablet (1,000 mg total) by mouth 2 (two) times daily with a meal. 03/23/22   [name], FNP  metFORMIN (GLUCOPHAGE) 500 MG tablet TAKE 1 TABLET BY MOUTH TWICE A DAY WITH BREAKFAST AND DINNER 05/27/22   [name], FNP  torsemide (DEMADEX) 20 MG tablet TAKE 2 TABLETS (40 MG TOTAL) BY MOUTH DAILY. 01/05/22   [name], FNP    Family History Family History  Problem Relation Age of Onset   Hypertension Mother    Hypertension Father     Social History Social History   Tobacco Use   Smoking status: Former    Years: 1.00    Types: Cigarettes    Quit date: 1999    Years since quitting: 24.9   Smokeless tobacco: Never   Tobacco comments:    1 pack would last one month--08/01/18  Vaping Use   Vaping Use: Never used  Substance Use Topics   Alcohol use: No   Drug use: No     Allergies   Patient has no known allergies.   Review of Systems Review of Systems  Musculoskeletal:  Positive for joint swelling (left foot big toe).     Physical Exam Triage Vital Signs ED Triage Vitals  Enc Vitals Group     BP 08/05/22 1536 (!) 178/109     Pulse Rate 08/05/22 1536 77     Resp 08/05/22 1536 20     Temp 08/05/22 1536 98.6 F (37 C)     Temp Source 08/05/22 1536 Oral     SpO2 08/05/22 1536 95 %     Weight --      Height --      Head Circumference --      Peak Flow --      Pain Score 08/05/22 1535 0     Pain Loc --      Pain Edu? --      Excl. in GC? --    No data found.  Updated Vital Signs BP (!) 178/109 (BP Location: Left Arm)   Pulse 77   Temp 98.6 F (37 C) (Oral)   Resp 20   SpO2 95%   Visual Acuity Right Eye Distance:   Left Eye Distance:   Bilateral Distance:    Right Eye Near:   Left Eye Near:    Bilateral Near:     Physical Exam Constitutional:      Appearance: Normal appearance.  Musculoskeletal:       Feet:  Feet:     Comments: Left lower leg: There is 1+ swelling of the lower leg and the ankle. Left  foot: There is tenderness palpated a long the first distal MTP joint area  the rest of the foot on palpation there is no pain.  Range of motion is normal all digits. Neurological:     Mental Status: He is alert.      UC Treatments / Results  Labs (all labs ordered are listed, but only abnormal results are displayed) Labs Reviewed - No data to display  EKG   Radiology No results found.  Procedures Procedures (including critical care time)  Medications Ordered in UC Medications - No data to display  Initial Impression / Assessment and Plan / UC Course  I have reviewed the triage vital signs and the nursing notes.  Pertinent labs & imaging results that were available during my care of the patient were reviewed by me and considered in my medical decision making (see chart for details).    Plan: 1.  The acute gout involving the left foot big toe will be treated with the following: A.  Ibuprofen 800 mg every 8 hours with food to help reduce pain and swelling. B.  Prednisone 10 mg 3 times a day for 5 days only. C.  Call Xilin 0.6 mg 1 tablet daily for 5 days only to reduce the uric acid level. 2.  Patient has been counseled on low purine diet and has been given information on the same subject. 3.  Advised follow-up PCP or return to urgent care if symptoms fail to improve. Final Clinical Impressions(s) / UC Diagnoses   Final diagnoses:  Acute gout involving toe of left foot, unspecified cause     Discharge Instructions      Advised to make sure to avoid organ meats and shellfish as this contributes to episodes of gout occurring. Advised take the ibuprofen 800 mg every 8 hours with food to help reduce pain and swelling. Advised take prednisone 10 mg 3 times a day for 5 days only to help reduce the inflammatory response. Advised take colchicine name 1 tablet daily for 5 days only to help reduce the uric acid level. Advised to follow-up PCP or return to urgent care if symptoms  fail to improve.    ED Prescriptions     Medication Sig Dispense Auth. Provider   colchicine 0.6 MG tablet Take 1 tablet (0.6 mg total) by mouth daily. 5 tablet Ellsworth Lennox, PA-C   ibuprofen (ADVIL) 800 MG tablet Take 1 tablet (800 mg total) by mouth 3 (three) times daily. 21 tablet Ellsworth Lennox, PA-C   predniSONE (DELTASONE) 10 MG tablet Take 1 tablet (10 mg total) by mouth in the morning, at noon, and at bedtime. 15 tablet Ellsworth Lennox, PA-C      PDMP not reviewed this encounter.   Ellsworth Lennox, PA-C 08/05/22 1621

## 2022-08-09 ENCOUNTER — Other Ambulatory Visit: Payer: Self-pay | Admitting: Nurse Practitioner

## 2022-08-10 ENCOUNTER — Other Ambulatory Visit: Payer: Self-pay | Admitting: Nurse Practitioner

## 2022-08-10 DIAGNOSIS — E1159 Type 2 diabetes mellitus with other circulatory complications: Secondary | ICD-10-CM

## 2022-08-10 DIAGNOSIS — I5032 Chronic diastolic (congestive) heart failure: Secondary | ICD-10-CM

## 2022-08-17 DIAGNOSIS — Z8679 Personal history of other diseases of the circulatory system: Secondary | ICD-10-CM | POA: Diagnosis not present

## 2022-08-17 DIAGNOSIS — R0609 Other forms of dyspnea: Secondary | ICD-10-CM | POA: Diagnosis not present

## 2022-08-20 NOTE — Progress Notes (Signed)
Cardiology Office Note:   Date:  08/21/2022  NAME:  Mark [name]    MRN: [id number] DOB:  09-25-1960   PCP:  [name], FNP  Cardiologist:  Armanda Magic, MD  Electrophysiologist:  None   Referring MD: [name], FNP   Chief Complaint  Patient presents with   Shortness of Breath        History of Present Illness:   Mark [name] is a 61 y.o. male with a hx of HFpEF, DM, HTN, HLD, obesity who is being seen today for the evaluation of HFpEF at the request of [name], FNP.  He reports for the past 1 to 2 weeks has become more short of breath with activity.  Initial oxygen saturation 72% on no oxygen.  He appears volume overloaded.  He is quite short of breath on examination as well as sweaty.  His blood pressure is elevated.  He reports no fevers or chills.  He does report history of diastolic heart failure.  He is diabetic.  He is never had a heart attack or stroke.  He is EKG demonstrates sinus rhythm with a left bundle branch block which is not new.  He has crackles on examination as well as lower extremity edema.  He was placed on 2 L nasal cannula oxygen with saturations up to the low 90s.  We discussed that given his respiratory failure he is best to be evaluated in the emergency room.  I cannot evaluate him in a timely manner here in the outpatient setting.  Medical history reviewed and is discussed scribed below.  Problem List HFpEF HTN DM -A1c 7.4 HLD -T chol 160, TG 99, HDL 56, LDL 86 OSA LBBB  Past Medical History: Past Medical History:  Diagnosis Date   Abnormal lung function test    a. Reported to possibly be COPD, but per pulm note: "Possible COPD - PFT was more suggestive of restrictive defect and diffusion defect likely from obesity and CHF"   Chronic diastolic heart failure (HCC) 05/07/2018   CKD (chronic kidney disease), stage III (HCC)    Diabetes mellitus type 2 in obese [facility])    Elevated troponin 05/07/2018   Hearing impaired     Hyperlipidemia    Hypertension    LBBB (left bundle branch block)    LVH (left ventricular hypertrophy)    Mild aortic stenosis    Mild pulmonary hypertension (HCC)    Morbid obesity (HCC)    Sleep apnea     Past Surgical History: Past Surgical History:  Procedure Laterality Date   BACK SURGERY     TRACHEOSTOMY      Current Medications: No outpatient medications have been marked as taking for the 08/21/22 encounter (Office Visit) with Sande Rives, MD.     Allergies:    Patient has no known allergies.   Social History: Social History   Socioeconomic History   Marital status: Married    Spouse name: Not on file   Number of children: Not on file   Years of education: Not on file   Highest education level: Not on file  Occupational History   Occupation: employed  Tobacco Use   Smoking status: Former    Years: 1.00    Types: Cigarettes    Quit date: 1999    Years since quitting: 24.9   Smokeless tobacco: Never   Tobacco comments:    1 pack would last one month--08/01/18  Vaping Use   Vaping Use: Never used  Substance and Sexual Activity   Alcohol use: No   Drug use: No   Sexual activity: Not Currently  Other Topics Concern   Not on file  Social History Narrative   Not on file   Social Determinants of Health   Financial Resource Strain: Low Risk  (04/08/2022)   Overall Financial Resource Strain (CARDIA)    Difficulty of Paying Living Expenses: Not hard at all  Food Insecurity: No Food Insecurity (04/08/2022)   Hunger Vital Sign    Worried About Running Out of Food in the Last Year: Never true    Ran Out of Food in the Last Year: Never true  Transportation Needs: No Transportation Needs (04/08/2022)   PRAPARE - Administrator, Civil ServiceTransportation    Lack of Transportation (Medical): No    Lack of Transportation (Non-Medical): No  Physical Activity: Inactive (04/08/2022)   Exercise Vital Sign    Days of Exercise per Week: 0 days    Minutes of Exercise per Session: 0 min   Stress: No Stress Concern Present (04/08/2022)   [employer] - Occupational Stress Questionnaire    Feeling of Stress : Not at all  Social Connections: Not on file     Family History: The patient's family history includes Hypertension in his father and mother.  ROS:   All other ROS reviewed and negative. Pertinent positives noted in the HPI.     EKGs/Labs/Other Studies Reviewed:   The following studies were personally reviewed by me today:  EKG:  EKG is ordered today.  The ekg ordered today demonstrates normal sinus rhythm heart rate 75, left bundle branch block, and was personally reviewed by me.   TTE 05/29/2019  1. Left ventricular ejection fraction, by visual estimation, is 65 to  70%. The left ventricle has normal function. Normal left ventricular size.  There is moderately increased left ventricular hypertrophy.   2. Left ventricular diastolic Doppler parameters are consistent with  impaired relaxation pattern of LV diastolic filling.   3. Septal motion consistent with conduction delay.   4. Global right ventricle has normal systolic function.The right  ventricular size is normal. No increase in right ventricular wall  thickness.   5. Left atrial size was mildly dilated.   6. Right atrial size was normal.   7. Mild mitral valve regurgitation.   8. The tricuspid valve is normal in structure. Tricuspid valve  regurgitation is trivial.   NM Stress 05/12/2018 IMPRESSION: 1. No reversible ischemia or infarction.   2. Generalized hypokinesia.  Dilated left ventricle.   3. Left ventricular ejection fraction 37%   4. Non invasive risk stratification*: Intermediate  Recent Labs: 01/05/2022: BUN 14; Creatinine, Ser 1.37; Potassium 4.0; Sodium 145   Recent Lipid Panel    Component Value Date/Time   CHOL 160 01/05/2022 1548   TRIG 99 01/05/2022 1548   HDL 56 01/05/2022 1548   CHOLHDL 2.9 01/05/2022 1548   CHOLHDL 2.8 05/08/2018 0520   VLDL 8  05/08/2018 0520   LDLCALC 86 01/05/2022 1548    Physical Exam:   VS:  BP (!) 162/90   Pulse 75   Ht 5\' 11"  (1.803 m)   Wt 266 lb 3.2 oz (120.7 kg)   SpO2 (!) 72% Comment: Gave 2L O2, increased to 93  BMI 37.13 kg/m    Wt Readings from Last 3 Encounters:  08/21/22 266 lb 3.2 oz (120.7 kg)  05/06/22 261 lb (118.4 kg)  04/08/22 264 lb 3.2 oz (119.8 kg)    General:  Well nourished, well developed, in no acute distress Head: Atraumatic, normal size  Eyes: PEERLA, EOMI  Neck: Supple, JVD 12 to 15 cm of water Endocrine: No thryomegaly Cardiac: Normal S1, S2; RRR; no murmurs, rubs, or gallops Lungs: Crackles at the lung bases Abd: Soft, nontender, no hepatomegaly  Ext: 2+ pitting edema Musculoskeletal: No deformities, BUE and BLE strength normal and equal Skin: Warm and dry, no rashes   Neuro: Alert and oriented to person, place, time, and situation, CNII-XII grossly intact, no focal deficits  Psych: Normal mood and affect   ASSESSMENT:   Mark [name] is a 61 y.o. male who presents for the following: 1. Acute hypoxic respiratory failure (HCC)   2. Acute diastolic heart failure (HCC)   3. Primary hypertension   4. Mixed hyperlipidemia   5. LBBB (left bundle branch block)     PLAN:   1. Acute hypoxic respiratory failure (HCC) 2. Acute diastolic heart failure (HCC) -Who presents with progressively worsening shortness of breath over the past 1 to 2 weeks.  Initial oxygen saturation in the 70s.  He has evidence of volume overload on examination.  His EKG demonstrates left bundle branch block which is not new.  He reports no chest pain or pressure.  He is slightly hypertensive.  He does have a history of diastolic heart failure.  He currently is with acute hypoxic respiratory failure.  Suspect this is related to possible diastolic heart failure.  He was placed on 2 L nasal cannula with improvement in oxygen saturation to the low 90s.  We discussed that he needs expedited evaluation  in the emergency room.  I cannot sort this out in the outpatient setting.  We will transfer him to the hospital by emergency medical services.  He is not safe to drive.  He will need full workup including chest x-ray, CMP, CBC, BNP.  Repeat echo also likely indicated.  We will see him after he is released from the hospital.  3. Primary hypertension 4. Mixed hyperlipidemia 5. LBBB (left bundle branch block) -All of these issues will be addressed at his follow-up appointment.  He is here acutely ill and needs emergency medical services.  Disposition: Return in about 2 months (around 10/22/2022).  Medication Adjustments/Labs and Tests Ordered: Current medicines are reviewed at length with the patient today.  Concerns regarding medicines are outlined above.  No orders of the defined types were placed in this encounter.  No orders of the defined types were placed in this encounter.   Patient Instructions  GO TO THE EMERGENCY DEPARTMENT VIA EMS    Medication Instructions:  The current medical regimen is effective;  continue present plan and medications.  *If you need a refill on your cardiac medications before your next appointment, please call your pharmacy*  Follow-Up: At [facility], you and your health needs are our priority.  As part of our continuing mission to provide you with exceptional heart care, we have created designated Provider Care Teams.  These Care Teams include your primary Cardiologist (physician) and Advanced Practice Providers (APPs -  Physician Assistants and Nurse Practitioners) who all work together to provide you with the care you need, when you need it.  We recommend signing up for the patient portal called "MyChart".  Sign up information is provided on this After Visit Summary.  MyChart is used to connect with patients for Virtual Visits (Telemedicine).  Patients are able to view lab/test results, encounter notes, upcoming appointments, etc.  Non-urgent  messages can be sent to your provider as well.   To learn more about what you can do with MyChart, go to [email or web address].    Your next appointment:   After hospital visit (we will contact you for a follow up visit)   The format for your next appointment:   In Person  Provider:   Lennie Odor, MD           Signed, Lenna Gilford. Flora Lipps, MD, [facility]  [facility]  [address], [address] [place], [place] [address] [phone]  08/21/2022 9:04 AM

## 2022-08-21 ENCOUNTER — Encounter (HOSPITAL_COMMUNITY): Payer: Self-pay | Admitting: Internal Medicine

## 2022-08-21 ENCOUNTER — Emergency Department (HOSPITAL_COMMUNITY): Payer: Medicare HMO

## 2022-08-21 ENCOUNTER — Encounter: Payer: Self-pay | Admitting: Cardiovascular Disease

## 2022-08-21 ENCOUNTER — Other Ambulatory Visit: Payer: Self-pay

## 2022-08-21 ENCOUNTER — Ambulatory Visit: Payer: Medicare HMO | Attending: Cardiovascular Disease | Admitting: Cardiovascular Disease

## 2022-08-21 ENCOUNTER — Inpatient Hospital Stay (HOSPITAL_COMMUNITY)
Admission: EM | Admit: 2022-08-21 | Discharge: 2022-08-26 | DRG: 291 | Disposition: A | Discharge status: Home Health | Payer: Medicare HMO | Attending: Family Medicine | Admitting: Family Medicine

## 2022-08-21 VITALS — BP 162/90 | HR 75 | Ht 71.0 in | Wt 266.2 lb

## 2022-08-21 DIAGNOSIS — Z7984 Long term (current) use of oral hypoglycemic drugs: Secondary | ICD-10-CM | POA: Diagnosis not present

## 2022-08-21 DIAGNOSIS — I350 Nonrheumatic aortic (valve) stenosis: Secondary | ICD-10-CM | POA: Diagnosis present

## 2022-08-21 DIAGNOSIS — E1151 Type 2 diabetes mellitus with diabetic peripheral angiopathy without gangrene: Secondary | ICD-10-CM | POA: Diagnosis present

## 2022-08-21 DIAGNOSIS — Z87891 Personal history of nicotine dependence: Secondary | ICD-10-CM

## 2022-08-21 DIAGNOSIS — E785 Hyperlipidemia, unspecified: Secondary | ICD-10-CM | POA: Diagnosis present

## 2022-08-21 DIAGNOSIS — I119 Hypertensive heart disease without heart failure: Secondary | ICD-10-CM | POA: Diagnosis present

## 2022-08-21 DIAGNOSIS — R7989 Other specified abnormal findings of blood chemistry: Secondary | ICD-10-CM | POA: Diagnosis present

## 2022-08-21 DIAGNOSIS — I272 Pulmonary hypertension, unspecified: Secondary | ICD-10-CM | POA: Diagnosis not present

## 2022-08-21 DIAGNOSIS — R0902 Hypoxemia: Principal | ICD-10-CM

## 2022-08-21 DIAGNOSIS — Z79899 Other long term (current) drug therapy: Secondary | ICD-10-CM

## 2022-08-21 DIAGNOSIS — R609 Edema, unspecified: Secondary | ICD-10-CM | POA: Diagnosis not present

## 2022-08-21 DIAGNOSIS — J9601 Acute respiratory failure with hypoxia: Secondary | ICD-10-CM

## 2022-08-21 DIAGNOSIS — E119 Type 2 diabetes mellitus without complications: Secondary | ICD-10-CM

## 2022-08-21 DIAGNOSIS — Z21 Asymptomatic human immunodeficiency virus [HIV] infection status: Secondary | ICD-10-CM | POA: Diagnosis present

## 2022-08-21 DIAGNOSIS — J9811 Atelectasis: Secondary | ICD-10-CM | POA: Diagnosis not present

## 2022-08-21 DIAGNOSIS — Z23 Encounter for immunization: Secondary | ICD-10-CM

## 2022-08-21 DIAGNOSIS — D6959 Other secondary thrombocytopenia: Secondary | ICD-10-CM | POA: Diagnosis not present

## 2022-08-21 DIAGNOSIS — J9621 Acute and chronic respiratory failure with hypoxia: Secondary | ICD-10-CM | POA: Diagnosis not present

## 2022-08-21 DIAGNOSIS — E782 Mixed hyperlipidemia: Secondary | ICD-10-CM

## 2022-08-21 DIAGNOSIS — M109 Gout, unspecified: Secondary | ICD-10-CM | POA: Diagnosis present

## 2022-08-21 DIAGNOSIS — I5031 Acute diastolic (congestive) heart failure: Secondary | ICD-10-CM

## 2022-08-21 DIAGNOSIS — E662 Morbid (severe) obesity with alveolar hypoventilation: Secondary | ICD-10-CM | POA: Diagnosis not present

## 2022-08-21 DIAGNOSIS — I5033 Acute on chronic diastolic (congestive) heart failure: Secondary | ICD-10-CM | POA: Diagnosis not present

## 2022-08-21 DIAGNOSIS — I10 Essential (primary) hypertension: Secondary | ICD-10-CM | POA: Diagnosis present

## 2022-08-21 DIAGNOSIS — Z1152 Encounter for screening for COVID-19: Secondary | ICD-10-CM | POA: Diagnosis not present

## 2022-08-21 DIAGNOSIS — I110 Hypertensive heart disease with heart failure: Secondary | ICD-10-CM | POA: Diagnosis not present

## 2022-08-21 DIAGNOSIS — D696 Thrombocytopenia, unspecified: Secondary | ICD-10-CM | POA: Diagnosis not present

## 2022-08-21 DIAGNOSIS — E1122 Type 2 diabetes mellitus with diabetic chronic kidney disease: Secondary | ICD-10-CM | POA: Diagnosis not present

## 2022-08-21 DIAGNOSIS — I130 Hypertensive heart and chronic kidney disease with heart failure and stage 1 through stage 4 chronic kidney disease, or unspecified chronic kidney disease: Principal | ICD-10-CM | POA: Diagnosis present

## 2022-08-21 DIAGNOSIS — I5032 Chronic diastolic (congestive) heart failure: Secondary | ICD-10-CM | POA: Diagnosis present

## 2022-08-21 DIAGNOSIS — I447 Left bundle-branch block, unspecified: Secondary | ICD-10-CM

## 2022-08-21 DIAGNOSIS — G4733 Obstructive sleep apnea (adult) (pediatric): Secondary | ICD-10-CM | POA: Insufficient documentation

## 2022-08-21 DIAGNOSIS — Z8249 Family history of ischemic heart disease and other diseases of the circulatory system: Secondary | ICD-10-CM

## 2022-08-21 DIAGNOSIS — R0602 Shortness of breath: Secondary | ICD-10-CM | POA: Diagnosis not present

## 2022-08-21 DIAGNOSIS — I5030 Unspecified diastolic (congestive) heart failure: Secondary | ICD-10-CM | POA: Diagnosis not present

## 2022-08-21 DIAGNOSIS — N183 Chronic kidney disease, stage 3 unspecified: Secondary | ICD-10-CM | POA: Diagnosis present

## 2022-08-21 DIAGNOSIS — R0689 Other abnormalities of breathing: Secondary | ICD-10-CM

## 2022-08-21 DIAGNOSIS — I739 Peripheral vascular disease, unspecified: Secondary | ICD-10-CM | POA: Diagnosis present

## 2022-08-21 DIAGNOSIS — E669 Obesity, unspecified: Secondary | ICD-10-CM | POA: Diagnosis present

## 2022-08-21 LAB — CBC WITH DIFFERENTIAL/PLATELET
Abs Immature Granulocytes: 0.03 10*3/uL (ref 0.00–0.07)
Basophils Absolute: 0 10*3/uL (ref 0.0–0.1)
Basophils Relative: 1 %
Eosinophils Absolute: 0.1 10*3/uL (ref 0.0–0.5)
Eosinophils Relative: 2 %
HCT: 49.2 % (ref 39.0–52.0)
Hemoglobin: 14.3 g/dL (ref 13.0–17.0)
Immature Granulocytes: 1 %
Lymphocytes Relative: 19 %
Lymphs Abs: 0.9 10*3/uL (ref 0.7–4.0)
MCH: 28.1 pg (ref 26.0–34.0)
MCHC: 29.1 g/dL — ABNORMAL LOW (ref 30.0–36.0)
MCV: 96.9 fL (ref 80.0–100.0)
Monocytes Absolute: 0.5 10*3/uL (ref 0.1–1.0)
Monocytes Relative: 11 %
Neutro Abs: 3.2 10*3/uL (ref 1.7–7.7)
Neutrophils Relative %: 66 %
Platelets: 126 10*3/uL — ABNORMAL LOW (ref 150–400)
RBC: 5.08 MIL/uL (ref 4.22–5.81)
RDW: 15.1 % (ref 11.5–15.5)
WBC: 4.8 10*3/uL (ref 4.0–10.5)
nRBC: 0 % (ref 0.0–0.2)

## 2022-08-21 LAB — COMPREHENSIVE METABOLIC PANEL
ALT: 23 U/L (ref 0–44)
AST: 16 U/L (ref 15–41)
Albumin: 3.2 g/dL — ABNORMAL LOW (ref 3.5–5.0)
Alkaline Phosphatase: 79 U/L (ref 38–126)
Anion gap: 7 (ref 5–15)
BUN: 20 mg/dL (ref 8–23)
CO2: 35 mmol/L — ABNORMAL HIGH (ref 22–32)
Calcium: 8.8 mg/dL — ABNORMAL LOW (ref 8.9–10.3)
Chloride: 103 mmol/L (ref 98–111)
Creatinine, Ser: 1.32 mg/dL — ABNORMAL HIGH (ref 0.61–1.24)
GFR, Estimated: >60 mL/min (ref >60)
Glucose, Bld: 108 mg/dL — ABNORMAL HIGH (ref 70–99)
Potassium: 3.8 mmol/L (ref 3.5–5.1)
Sodium: 145 mmol/L (ref 135–145)
Total Bilirubin: 1 mg/dL (ref 0.3–1.2)
Total Protein: 6.1 g/dL — ABNORMAL LOW (ref 6.5–8.1)

## 2022-08-21 LAB — BLOOD GAS, VENOUS
Acid-Base Excess: 14.5 mmol/L — ABNORMAL HIGH (ref 0.0–2.0)
Acid-Base Excess: 13.6 mmol/L — ABNORMAL HIGH (ref 0.0–2.0)
Bicarbonate: 43.2 mmol/L — ABNORMAL HIGH (ref 20.0–28.0)
Bicarbonate: 41.5 mmol/L — ABNORMAL HIGH (ref 20.0–28.0)
Drawn by: 6807
O2 Saturation: 82.6 %
O2 Saturation: 89.6 %
Patient temperature: 38
Patient temperature: 36.8
pCO2, Ven: 76 mmHg (ref 44–60)
pCO2, Ven: 63 mmHg — ABNORMAL HIGH (ref 44–60)
pH, Ven: 7.37 (ref 7.25–7.43)
pH, Ven: 7.42 (ref 7.25–7.43)
pO2, Ven: 54 mmHg — ABNORMAL HIGH (ref 32–45)
pO2, Ven: 54 mmHg — ABNORMAL HIGH (ref 32–45)

## 2022-08-21 LAB — BRAIN NATRIURETIC PEPTIDE: B Natriuretic Peptide: 241.2 pg/mL — ABNORMAL HIGH (ref 0.0–100.0)

## 2022-08-21 LAB — RESP PANEL BY RT-PCR (RSV, FLU A&B, COVID)  RVPGX2
Influenza A by PCR: NEGATIVE
Influenza B by PCR: NEGATIVE
Resp Syncytial Virus by PCR: NEGATIVE
SARS Coronavirus 2 by RT PCR: NEGATIVE

## 2022-08-21 LAB — TROPONIN I (HIGH SENSITIVITY)
Troponin I (High Sensitivity): 59 ng/L — ABNORMAL HIGH (ref <18)
Troponin I (High Sensitivity): 44 ng/L — ABNORMAL HIGH (ref <18)

## 2022-08-21 LAB — GLUCOSE, CAPILLARY: Glucose-Capillary: 127 mg/dL — ABNORMAL HIGH (ref 70–99)

## 2022-08-21 MED ORDER — HYDRALAZINE HCL 20 MG/ML IJ SOLN
5.0000 mg | Freq: Once | INTRAMUSCULAR | Status: AC
  Administered 2022-08-21: 5 mg via INTRAVENOUS
  Filled 2022-08-21: qty 1

## 2022-08-21 MED ORDER — FUROSEMIDE 10 MG/ML IJ SOLN
40.0000 mg | Freq: Once | INTRAMUSCULAR | Status: AC
  Administered 2022-08-21: 40 mg via INTRAVENOUS
  Filled 2022-08-21: qty 4

## 2022-08-21 MED ORDER — INSULIN ASPART 100 UNIT/ML IJ SOLN: 0.0000 [IU] | Freq: Every day | INTRAMUSCULAR | Status: DC

## 2022-08-21 MED ORDER — NITROGLYCERIN 2 % TD OINT: 1.0000 [in_us] | TOPICAL_OINTMENT | Freq: Once | TRANSDERMAL | Status: DC

## 2022-08-21 MED ORDER — IOHEXOL 350 MG/ML SOLN
100.0000 mL | Freq: Once | INTRAVENOUS | Status: AC | PRN
  Administered 2022-08-21: 100 mL via INTRAVENOUS

## 2022-08-21 MED ORDER — FUROSEMIDE 10 MG/ML IJ SOLN
40.0000 mg | Freq: Two times a day (BID) | INTRAMUSCULAR | Status: DC
  Administered 2022-08-21 – 2022-08-26 (×10): 40 mg via INTRAVENOUS
  Filled 2022-08-21 (×10): qty 4

## 2022-08-21 MED ORDER — FUROSEMIDE 10 MG/ML IJ SOLN: 40.0000 mg | Freq: Once | INTRAMUSCULAR | Status: DC

## 2022-08-21 MED ORDER — NITROGLYCERIN 2 % TD OINT
1.0000 [in_us] | TOPICAL_OINTMENT | Freq: Once | TRANSDERMAL | Status: AC
  Administered 2022-08-21: 1 [in_us] via TOPICAL
  Filled 2022-08-21: qty 1

## 2022-08-21 MED ORDER — ENOXAPARIN SODIUM 60 MG/0.6ML IJ SOSY
60.0000 mg | PREFILLED_SYRINGE | INTRAMUSCULAR | Status: DC
  Administered 2022-08-21 – 2022-08-25 (×5): 60 mg via SUBCUTANEOUS
  Filled 2022-08-21 (×5): qty 0.6

## 2022-08-21 MED ORDER — INSULIN ASPART 100 UNIT/ML IJ SOLN
0.0000 [IU] | Freq: Three times a day (TID) | INTRAMUSCULAR | Status: DC
  Administered 2022-08-22: 3 [IU] via SUBCUTANEOUS
  Administered 2022-08-22: 2 [IU] via SUBCUTANEOUS
  Administered 2022-08-22: 3 [IU] via SUBCUTANEOUS
  Administered 2022-08-25 (×2): 2 [IU] via SUBCUTANEOUS

## 2022-08-21 MED ORDER — ACETAMINOPHEN 325 MG PO TABS: 650.0000 mg | ORAL_TABLET | Freq: Four times a day (QID) | ORAL | Status: DC | PRN

## 2022-08-21 MED ORDER — ACETAMINOPHEN 650 MG RE SUPP: 650.0000 mg | Freq: Four times a day (QID) | RECTAL | Status: DC | PRN

## 2022-08-21 NOTE — ED Triage Notes (Signed)
Pt BIB EMS from doctor's office c/o sob x 2-3 weeks.72% room air at doctor's office.

## 2022-08-21 NOTE — ED Provider Notes (Signed)
[facility]   CSN: [id number] Arrival date & time: 08/21/22  4098     History  Chief Complaint  Patient presents with  . Shortness of Breath    Mark [name] is a 61 y.o. male.  With PMH of HFpEF, CKD, aortic stenosis, mild pulmonary hypertension, OSA, morbid obesity BIB EMS from cardiologist office for shortness of breath found to be hypoxic 72% on RA and concern for volume overload.  Patient is a very poor historian.  However he is complaining of worsening shortness of breath mainly with activity.  He notes that has been getting worse over the past 1 to 2 weeks.  He denies orthopnea or PND however when trialing laying him flat in the bed he is unable to tolerate it.  He says he wears a nasal cannula at night but does not use any BiPAP.  He has had some increased swelling in his legs mainly in his left leg which he attributes to gout.  He has had no fevers or URI symptoms.  He denies any history of PE or DVT.  He is on torsemide daily and intermittently misses doses.  He is denying any active chest pain.  He is no longer smoking and quit a few months ago.   Shortness of Breath      Home Medications Prior to Admission medications   Medication Sig Start Date End Date Taking? Authorizing Provider  atorvastatin (LIPITOR) 20 MG tablet TAKE 1 TABLET BY MOUTH EVERY DAY 08/10/22   [name], FNP  carvedilol (COREG) 6.25 MG tablet TAKE 1 TABLET BY MOUTH TWICE A DAY WITH FOOD 08/10/22   [name], FNP  colchicine 0.6 MG tablet Take 1 tablet (0.6 mg total) by mouth daily. 08/05/22   Ellsworth Lennox, PA-C  gabapentin (NEURONTIN) 100 MG capsule TAKE 1 CAPSULE (100 MG TOTAL) BY MOUTH AT BEDTIME. 07/18/18   Vivi Barrack, DPM  ibuprofen (ADVIL) 800 MG tablet Take 1 tablet (800 mg total) by mouth 3 (three) times daily. 08/05/22   Ellsworth Lennox, PA-C  losartan (COZAAR) 25 MG tablet TAKE 1 TABLET (25 MG TOTAL) BY MOUTH DAILY. PATIENT NEEDS  APPT BEFORE 30 DAY SUPPLY ENDS. 07/07/22   [name], FNP  meloxicam (MOBIC) 7.5 MG tablet Take 7.5 mg by mouth daily. 01/07/20   [provider]  metFORMIN (GLUCOPHAGE) 1000 MG tablet TAKE 1 TABLET (1,000 MG TOTAL) BY MOUTH TWICE A DAY WITH FOOD 08/10/22   [name], FNP  metFORMIN (GLUCOPHAGE) 500 MG tablet TAKE 1 TABLET BY MOUTH TWICE A DAY WITH BREAKFAST AND DINNER 05/27/22   [name], FNP  predniSONE (DELTASONE) 10 MG tablet Take 1 tablet (10 mg total) by mouth in the morning, at noon, and at bedtime. 08/05/22   Ellsworth Lennox, PA-C  torsemide (DEMADEX) 20 MG tablet TAKE 2 TABLETS (40 MG TOTAL) BY MOUTH DAILY. 08/10/22   [name], FNP      Allergies    Patient has no known allergies.    Review of Systems   Review of Systems  Respiratory:  Positive for shortness of breath.     Physical Exam Updated Vital Signs BP (!) 148/99   Pulse (!) 55   Temp 97.8 F (36.6 C) (Oral)   Resp 18   Ht 5\' 11"  (1.803 m)   Wt 120 kg   SpO2 98%   BMI 36.90 kg/m  Physical Exam Constitutional: Alert and oriented.  Sitting up in bed no acute distress  Eyes: Conjunctivae are normal. ENT      Head: Normocephalic and atraumatic.      Nose: No congestion.      Mouth/Throat: Mucous membranes are moist.      Neck: No stridor. Cardiovascular: S1, S2, bradycardic, regular rhythm, warm and dry Respiratory: Normal respiratory effort.  Faint crackles at bases, O2 sat 82% on room air Gastrointestinal: Soft and nontender Musculoskeletal: Normal range of motion in all extremities. 3+ pitting edema of lower extremities extending to knee, left lower extremity slightly larger than right with mild erythema, no tenderness to palpation Neurologic: Normal speech and language. No gross focal neurologic deficits are appreciated. Skin: Skin is warm, dry and intact. No rash noted. Psychiatric: Mood and affect are normal. Speech and behavior are normal.  ED Results / Procedures / Treatments    Labs (all labs ordered are listed, but only abnormal results are displayed) Labs Reviewed  BLOOD GAS, VENOUS - Abnormal; Notable for the following components:      Result Value   pCO2, Ven 76 (*)    pO2, Ven 54 (*)    Bicarbonate 43.2 (*)    Acid-Base Excess 14.5 (*)    All other components within normal limits  COMPREHENSIVE METABOLIC PANEL - Abnormal; Notable for the following components:   CO2 35 (*)    Glucose, Bld 108 (*)    Creatinine, Ser 1.32 (*)    Calcium 8.8 (*)    Total Protein 6.1 (*)    Albumin 3.2 (*)    All other components within normal limits  CBC WITH DIFFERENTIAL/PLATELET - Abnormal; Notable for the following components:   MCHC 29.1 (*)    Platelets 126 (*)    All other components within normal limits  BRAIN NATRIURETIC PEPTIDE - Abnormal; Notable for the following components:   B Natriuretic Peptide 241.2 (*)    All other components within normal limits  TROPONIN I (HIGH SENSITIVITY) - Abnormal; Notable for the following components:   Troponin I (High Sensitivity) 59 (*)    All other components within normal limits  RESP PANEL BY RT-PCR (RSV, FLU A&B, COVID)  RVPGX2  BLOOD GAS, VENOUS  TROPONIN I (HIGH SENSITIVITY)    EKG EKG Interpretation  Date/Time:  Friday August 21 2022 09:43:19 EST Ventricular Rate:  70 PR Interval:  164 QRS Duration: 180 QT Interval:  450 QTC Calculation: 486 R Axis:   -2 Text Interpretation: Sinus rhythm Atrial premature complex Vent pre-excit'n(WPW), right access'y pathway LBBB known No significant change since last tracing Confirmed by Georgina Snell [phone]) on 08/21/2022 9:49:35 AM  Radiology DG Chest 2 View  Result Date: 08/21/2022 CLINICAL DATA:  shortness of breath, fluid overload EXAM: PORTABLE CHEST 1 VIEW COMPARISON:  08/17/2022 FINDINGS: Cardiac silhouette is prominent. There is pulmonary interstitial prominence with vascular congestion. No focal consolidation. No pneumothorax or pleural effusion  identified. Aorta is calcified. IMPRESSION: Findings suggest CHF. Electronically Signed   By: Sammie Bench M.D.   On: 08/21/2022 10:44    Procedures Procedures    Medications Ordered in ED Medications  furosemide (LASIX) injection 40 mg (40 mg Intravenous Given 08/21/22 1139)  nitroGLYCERIN (NITROGLYN) 2 % ointment 1 inch (1 inch Topical Given 08/21/22 1139)    ED Course/ Medical Decision Making/ A&P Clinical Course as of 08/21/22 1437  Fri Aug 21, 2022  1437 Spoke with on-call cardiology team who will likely see patient inpatient tomorrow and give formal recommendations but notes that Dr. Kathalene Frames initial  evaluation today will hold [VB]  Clinical Course User Index [VB] Elgie Congo, MD                           Medical Decision Making JACORRI CROMBIE is a 61 y.o. male.  With PMH of HFpEF, CKD, aortic stenosis, mild pulmonary hypertension, OSA, morbid obesity BIB EMS from cardiologist office for shortness of breath found to be hypoxic 72% on RA and concern for volume overload.   Based on patient's history and prior presentation and new hypoxia, suspect most likely hypoxia from fluid overload with underlying heart failure and pitting edema of lower extremities.  Chest x-ray obtained which I personally reviewed showing cardiomegaly with vascular congestion but no pneumothorax or pleural effusion.  No consolidation concerning for pneumonia.  Additionally patient with no symptoms suggestive of pneumonia and normal white blood cell count 4.8.  BNP slightly elevated to 241.  EKG with left bundle branch block which is similar to previous and no acute ST/T changes and no chest pain, unlikely ACS however troponin elevated 59 which I suspect is secondary to hypertension and CHF.    Patient was not in significant respiratory distress on arrival was placed on 3 L nasal cannula for hypoxia however VBG obtained with pH 7.37 and pCO2 76.  HCO3 43.  Did not appear to be confused on exam but  started on BiPAP for hypercarbia.  Patient also given IV Lasix 40 mg and nitroglycerin ointment due to concern for fluid overload and initial hypertension 178/122 with hypoxia 82%.  Patient did have CTA chest PE protocol due to concern for possible PE with left lower extremity slightly larger than right lower extremity and morbid obesity with no significant findings on chest x-ray, imaging showed no PE but findings concerning for pulmonary hypertension with dilated pulmonary trunk which is likely contributing to patient's hypoxia.  Spoke with Dr. Marylyn Ishihara hospitalist will admit patient for further management of hypoxia/hypercarbia in the setting of pulmonary hypertension and CHF.  Will consult cardiology for inpatient evaluation per hospitalist request.  Amount and/or Complexity of Data Reviewed Labs: ordered. Radiology: ordered.  Risk Prescription drug management. Decision regarding hospitalization.   Final Clinical Impression(s) / ED Diagnoses Final diagnoses:  Hypoxia  Hypercarbia    Rx / DC Orders ED Discharge Orders     None         Elgie Congo, MD 08/21/22 1419

## 2022-08-21 NOTE — Patient Instructions (Signed)
GO TO THE EMERGENCY DEPARTMENT VIA EMS    Medication Instructions:  The current medical regimen is effective;  continue present plan and medications.  *If you need a refill on your cardiac medications before your next appointment, please call your pharmacy*  Follow-Up: At [name], you and your health needs are our priority.  As part of our continuing mission to provide you with exceptional heart care, we have created designated Provider Care Teams.  These Care Teams include your primary Cardiologist (physician) and Advanced Practice Providers (APPs -  Physician Assistants and Nurse Practitioners) who all work together to provide you with the care you need, when you need it.  We recommend signing up for the patient portal called "MyChart".  Sign up information is provided on this After Visit Summary.  MyChart is used to connect with patients for Virtual Visits (Telemedicine).  Patients are able to view lab/test results, encounter notes, upcoming appointments, etc.  Non-urgent messages can be sent to your provider as well.   To learn more about what you can do with MyChart, go to [email or web address].    Your next appointment:   After hospital visit (we will contact you for a follow up visit)   The format for your next appointment:   In Person  Provider:   Lennie Odor, MD

## 2022-08-21 NOTE — H&P (Signed)
History and Physical    Patient: Mark [name]:[id number] DOB: [birth year] DOA: 08/21/2022 DOS: the patient was seen and examined on 08/21/2022 PCP: [name], FNP  Patient coming from: Home  Chief Complaint:  Chief Complaint  Patient presents with   Shortness of Breath   HPI: Mark [name] is a 61 y.o. male with medical history significant of DM2, HTN, HLD, HFpEF. Presenting with 2 weeks of DOE. He reports that he has been dyspneic with normal activities. He hasn't had any chest pain or palpitations. He reports some swelling in his legs, but he attributes that to gout. He has not had any fever or cough. He has not had any sick contacts. He saw his primary care team during this time and they recommended that he see a cardiologist. He went to the cardiology appointment this morning. During their evaluation, they became concerned for acute HF exacerbation. So they recommended he go to the ED for evaluation. He denies any other aggravating or alleviating factors.    Review of Systems: As mentioned in the history of present illness. All other systems reviewed and are negative. Past Medical History:  Diagnosis Date   Abnormal lung function test    a. Reported to possibly be COPD, but per pulm note: "Possible COPD - PFT was more suggestive of restrictive defect and diffusion defect likely from obesity and CHF"   Chronic diastolic heart failure (HCC) 05/07/2018   CKD (chronic kidney disease), stage III (HCC)    Diabetes mellitus type 2 in obese [facility])    Elevated troponin 05/07/2018   Hearing impaired    Hyperlipidemia    Hypertension    LBBB (left bundle branch block)    LVH (left ventricular hypertrophy)    Mild aortic stenosis    Mild pulmonary hypertension (HCC)    Morbid obesity (HCC)    Sleep apnea    Past Surgical History:  Procedure Laterality Date   BACK SURGERY     TRACHEOSTOMY     Social History:  reports that he quit smoking about 24 years ago. His smoking use  included cigarettes. He has never used smokeless tobacco. He reports that he does not drink alcohol and does not use drugs.  No Known Allergies  Family History  Problem Relation Age of Onset   Hypertension Mother    Hypertension Father     Prior to Admission medications   Medication Sig Start Date End Date Taking? Authorizing Provider  atorvastatin (LIPITOR) 20 MG tablet TAKE 1 TABLET BY MOUTH EVERY DAY 08/10/22   [name], FNP  carvedilol (COREG) 6.25 MG tablet TAKE 1 TABLET BY MOUTH TWICE A DAY WITH FOOD 08/10/22   [name], FNP  colchicine 0.6 MG tablet Take 1 tablet (0.6 mg total) by mouth daily. 08/05/22   Ellsworth Lennox, PA-C  gabapentin (NEURONTIN) 100 MG capsule TAKE 1 CAPSULE (100 MG TOTAL) BY MOUTH AT BEDTIME. 07/18/18   Vivi Barrack, DPM  ibuprofen (ADVIL) 800 MG tablet Take 1 tablet (800 mg total) by mouth 3 (three) times daily. 08/05/22   Ellsworth Lennox, PA-C  losartan (COZAAR) 25 MG tablet TAKE 1 TABLET (25 MG TOTAL) BY MOUTH DAILY. PATIENT NEEDS APPT BEFORE 30 DAY SUPPLY ENDS. 07/07/22   [name], FNP  meloxicam (MOBIC) 7.5 MG tablet Take 7.5 mg by mouth daily. 01/07/20   [provider]  metFORMIN (GLUCOPHAGE) 1000 MG tablet TAKE 1 TABLET (1,000 MG TOTAL) BY MOUTH TWICE A DAY WITH FOOD 08/10/22   Christell Constant,  Lolita Cram, FNP  metFORMIN (GLUCOPHAGE) 500 MG tablet TAKE 1 TABLET BY MOUTH TWICE A DAY WITH BREAKFAST AND DINNER 05/27/22   [name], FNP  predniSONE (DELTASONE) 10 MG tablet Take 1 tablet (10 mg total) by mouth in the morning, at noon, and at bedtime. 08/05/22   Ellsworth Lennox, PA-C  torsemide (DEMADEX) 20 MG tablet TAKE 2 TABLETS (40 MG TOTAL) BY MOUTH DAILY. 08/10/22   [name], FNP    Physical Exam: Vitals:   08/21/22 1230 08/21/22 1345 08/21/22 1400 08/21/22 1414  BP: (!) 157/109  (!) 165/99 (!) 165/99  Pulse:  70 60 67  Resp: (!) 0 (!) 0 12 16  Temp:   98.7 F (37.1 C)   TempSrc:   Oral   SpO2:  95%  98%  Weight:      Height:        General: 61 y.o. male resting in bed in NAD Eyes: PERRL, normal sclera ENMT: Nares patent w/o discharge, orophaynx clear, dentition normal, ears w/o discharge/lesions/ulcers Neck: Supple, trachea midline Cardiovascular: RRR, +S1, S2, no m/g/r, equal pulses throughout Respiratory: decreased at bases, +rhales, no w/r, slightly increased WOB on 2L GI: BS+, NDNT, no masses noted, no organomegaly noted MSK: No c/c, 2+ BLE edema Skin: No rashes, bruises, ulcerations noted Neuro: A&O x 3, other than HoH no focal deficits Psyc: Appropriate interaction and affect, calm/cooperative  Data Reviewed:  Results for orders placed or performed during the hospital encounter of 08/21/22 (from the past 24 hour(s))  Blood gas, venous     Status: Abnormal   Collection Time: 08/21/22 10:01 AM  Result Value Ref Range   pH, Ven 7.37 7.25 - 7.43   pCO2, Ven 76 (HH) 44 - 60 mmHg   pO2, Ven 54 (H) 32 - 45 mmHg   Bicarbonate 43.2 (H) 20.0 - 28.0 mmol/L   Acid-Base Excess 14.5 (H) 0.0 - 2.0 mmol/L   O2 Saturation 82.6 %   Patient temperature 38.0   Comprehensive metabolic panel     Status: Abnormal   Collection Time: 08/21/22 10:01 AM  Result Value Ref Range   Sodium 145 135 - 145 mmol/L   Potassium 3.8 3.5 - 5.1 mmol/L   Chloride 103 98 - 111 mmol/L   CO2 35 (H) 22 - 32 mmol/L   Glucose, Bld 108 (H) 70 - 99 mg/dL   BUN 20 8 - 23 mg/dL   Creatinine, Ser 4.19 (H) 0.61 - 1.24 mg/dL   Calcium 8.8 (L) 8.9 - 10.3 mg/dL   Total Protein 6.1 (L) 6.5 - 8.1 g/dL   Albumin 3.2 (L) 3.5 - 5.0 g/dL   AST 16 15 - 41 U/L   ALT 23 0 - 44 U/L   Alkaline Phosphatase 79 38 - 126 U/L   Total Bilirubin 1.0 0.3 - 1.2 mg/dL   GFR, Estimated >37 >90 mL/min   Anion gap 7 5 - 15  CBC with Differential     Status: Abnormal   Collection Time: 08/21/22 10:01 AM  Result Value Ref Range   WBC 4.8 4.0 - 10.5 K/uL   RBC 5.08 4.22 - 5.81 MIL/uL   Hemoglobin 14.3 13.0 - 17.0 g/dL   HCT 24.0 97.3 - 53.2 %   MCV 96.9 80.0 -  100.0 fL   MCH 28.1 26.0 - 34.0 pg   MCHC 29.1 (L) 30.0 - 36.0 g/dL   RDW 99.2 42.6 - 83.4 %   Platelets 126 (L) 150 - 400 K/uL   nRBC 0.0  0.0 - 0.2 %   Neutrophils Relative % 66 %   Neutro Abs 3.2 1.7 - 7.7 K/uL   Lymphocytes Relative 19 %   Lymphs Abs 0.9 0.7 - 4.0 K/uL   Monocytes Relative 11 %   Monocytes Absolute 0.5 0.1 - 1.0 K/uL   Eosinophils Relative 2 %   Eosinophils Absolute 0.1 0.0 - 0.5 K/uL   Basophils Relative 1 %   Basophils Absolute 0.0 0.0 - 0.1 K/uL   Immature Granulocytes 1 %   Abs Immature Granulocytes 0.03 0.00 - 0.07 K/uL  Brain natriuretic peptide     Status: Abnormal   Collection Time: 08/21/22 10:01 AM  Result Value Ref Range   B Natriuretic Peptide 241.2 (H) 0.0 - 100.0 pg/mL  Troponin I (High Sensitivity)     Status: Abnormal   Collection Time: 08/21/22 10:01 AM  Result Value Ref Range   Troponin I (High Sensitivity) 59 (H) <18 ng/L  Resp panel by RT-PCR (RSV, Flu A&B, Covid) Anterior Nasal Swab     Status: None   Collection Time: 08/21/22 10:33 AM   Specimen: Anterior Nasal Swab  Result Value Ref Range   SARS Coronavirus 2 by RT PCR NEGATIVE NEGATIVE   Influenza A by PCR NEGATIVE NEGATIVE   Influenza B by PCR NEGATIVE NEGATIVE   Resp Syncytial Virus by PCR NEGATIVE NEGATIVE   CXR: Findings suggest CHF.   CTA Chest PE: 1. No evidence of pulmonary embolism. 2. Main pulmonary trunk is dilated measuring up to 3.8 cm concerning for pulmonary arterial hypertension. 3. Lungs are clear without evidence of acute pulmonary process.  EKG: sinus, known LBBB  Assessment and Plan: Acute on chronic HFpEF     - admit to inpt, tele     - continue diuresis w/ lasix     - check echo     - trend troponin     - I&O, daily weight, fluid restrictions     - cardiology onboard, appreciate assistance  HTN     - continue home regimen when confirmed  HLD     - continue home regimen when confirmed  DM2     - A1c, SSI, DM diet, glucose checks  OSA     -  he doesn't use PAP at home     - given his comorbidities, he should have PAP at night; will order while he's inpt; will need formal studies for outpt  Advance Care Planning:   Code Status: FULL  Consults: Cardiology  Family Communication: w/ sister by phone  Severity of Illness: The appropriate patient status for this patient is INPATIENT. Inpatient status is judged to be reasonable and necessary in order to provide the required intensity of service to ensure the patient's safety. The patient's presenting symptoms, physical exam findings, and initial radiographic and laboratory data in the context of their chronic comorbidities is felt to place them at high risk for further clinical deterioration. Furthermore, it is not anticipated that the patient will be medically stable for discharge from the hospital within 2 midnights of admission.   * I certify that at the point of admission it is my clinical judgment that the patient will require inpatient hospital care spanning beyond 2 midnights from the point of admission due to high intensity of service, high risk for further deterioration and high frequency of surveillance required.*  Author: Teddy Spike, DO 08/21/2022 2:21 PM  For on call review [email or web address].

## 2022-08-21 NOTE — ED Notes (Signed)
ED TO INPATIENT HANDOFF REPORT  ED Nurse Name and Phone #: Dorene Sorrow Name/Age/Gender Mark [name] 61 y.o. male Room/Bed: WA15/WA15  Code Status   Code Status: Full Code  Home/SNF/Other Home Patient oriented to: self, place, time, and situation Is this baseline? Yes   Triage Complete: Triage complete  Chief Complaint Acute on chronic diastolic (congestive) heart failure (HCC) [I50.33]  Triage Note Pt BIB EMS from doctor's office c/o sob x 2-3 weeks.72% room air at doctor's office.   Allergies No Known Allergies  Level of Care/Admitting Diagnosis ED Disposition     ED Disposition  Admit   Condition  --   Comment  Hospital Area: [facility] [[id number]]  Level of Care: Telemetry [5]  Admit to tele based on following criteria: Acute CHF  May admit patient to [facility] or [facility] if equivalent level of care is available:: No  Covid Evaluation: Confirmed COVID Negative  Diagnosis: Acute on chronic diastolic (congestive) heart failure [facility]) [[id number]]  Admitting Physician: Teddy Spike [[id number]]  Attending Physician: Teddy Spike [[id number]]  Certification:: I certify this patient will need inpatient services for at least 2 midnights  Estimated Length of Stay: 2          B Medical/Surgery History Past Medical History:  Diagnosis Date   Abnormal lung function test    a. Reported to possibly be COPD, but per pulm note: "Possible COPD - PFT was more suggestive of restrictive defect and diffusion defect likely from obesity and CHF"   Chronic diastolic heart failure (HCC) 05/07/2018   CKD (chronic kidney disease), stage III (HCC)    Diabetes mellitus type 2 in obese [facility])    Elevated troponin 05/07/2018   Hearing impaired    Hyperlipidemia    Hypertension    LBBB (left bundle branch block)    LVH (left ventricular hypertrophy)    Mild aortic stenosis    Mild pulmonary hypertension (HCC)    Morbid obesity (HCC)    Sleep apnea    Past  Surgical History:  Procedure Laterality Date   BACK SURGERY     TRACHEOSTOMY       A IV Location/Drains/Wounds Patient Lines/Drains/Airways Status     Active Line/Drains/Airways     Name Placement date Placement time Site Days   Peripheral IV 05/13/18 Right Forearm 05/13/18  2332  Forearm  1561            Intake/Output Last 24 hours No intake or output data in the 24 hours ending 08/21/22 1622  Labs/Imaging Results for orders placed or performed during the hospital encounter of 08/21/22 (from the past 48 hour(s))  Blood gas, venous     Status: Abnormal   Collection Time: 08/21/22 10:01 AM  Result Value Ref Range   pH, Ven 7.37 7.25 - 7.43   pCO2, Ven 76 (HH) 44 - 60 mmHg    Comment: CRITICAL RESULT CALLED TO, READ BACK BY AND VERIFIED WITH: CAMERON RN AT 1016 ON 12.22.23 BY S.VANHOORNE    pO2, Ven 54 (H) 32 - 45 mmHg   Bicarbonate 43.2 (H) 20.0 - 28.0 mmol/L   Acid-Base Excess 14.5 (H) 0.0 - 2.0 mmol/L   O2 Saturation 82.6 %   Patient temperature 38.0     Comment: Performed at [facility], [address], [place], [place] [address]  Comprehensive metabolic panel     Status: Abnormal   Collection Time: 08/21/22 10:01 AM  Result Value Ref Range  Sodium 145 135 - 145 mmol/L   Potassium 3.8 3.5 - 5.1 mmol/L   Chloride 103 98 - 111 mmol/L   CO2 35 (H) 22 - 32 mmol/L   Glucose, Bld 108 (H) 70 - 99 mg/dL    Comment: Glucose reference range applies only to samples taken after fasting for at least 8 hours.   BUN 20 8 - 23 mg/dL   Creatinine, Ser 1.611.32 (H) 0.61 - 1.24 mg/dL   Calcium 8.8 (L) 8.9 - 10.3 mg/dL   Total Protein 6.1 (L) 6.5 - 8.1 g/dL   Albumin 3.2 (L) 3.5 - 5.0 g/dL   AST 16 15 - 41 U/L   ALT 23 0 - 44 U/L   Alkaline Phosphatase 79 38 - 126 U/L   Total Bilirubin 1.0 0.3 - 1.2 mg/dL   GFR, Estimated >09>60 >60>60 mL/min    Comment: (NOTE) Calculated using the CKD-EPI Creatinine Equation (2021)    Anion gap 7 5 - 15    Comment: Performed  at [facility], [address] [place], [place] [address]  CBC with Differential     Status: Abnormal   Collection Time: 08/21/22 10:01 AM  Result Value Ref Range   WBC 4.8 4.0 - 10.5 K/uL   RBC 5.08 4.22 - 5.81 MIL/uL   Hemoglobin 14.3 13.0 - 17.0 g/dL   HCT 81.149.2 91.439.0 - 78.252.0 %   MCV 96.9 80.0 - 100.0 fL   MCH 28.1 26.0 - 34.0 pg   MCHC 29.1 (L) 30.0 - 36.0 g/dL   RDW 95.615.1 21.311.5 - 08.615.5 %   Platelets 126 (L) 150 - 400 K/uL   nRBC 0.0 0.0 - 0.2 %   Neutrophils Relative % 66 %   Neutro Abs 3.2 1.7 - 7.7 K/uL   Lymphocytes Relative 19 %   Lymphs Abs 0.9 0.7 - 4.0 K/uL   Monocytes Relative 11 %   Monocytes Absolute 0.5 0.1 - 1.0 K/uL   Eosinophils Relative 2 %   Eosinophils Absolute 0.1 0.0 - 0.5 K/uL   Basophils Relative 1 %   Basophils Absolute 0.0 0.0 - 0.1 K/uL   Immature Granulocytes 1 %   Abs Immature Granulocytes 0.03 0.00 - 0.07 K/uL    Comment: Performed at [facility], [address] [place], [place] [address]  Brain natriuretic peptide     Status: Abnormal   Collection Time: 08/21/22 10:01 AM  Result Value Ref Range   B Natriuretic Peptide 241.2 (H) 0.0 - 100.0 pg/mL    Comment: Performed at [facility], [address] [place], [place] [address]  Troponin I (High Sensitivity)     Status: Abnormal   Collection Time: 08/21/22 10:01 AM  Result Value Ref Range   Troponin I (High Sensitivity) 59 (H) <18 ng/L    Comment: (NOTE) Elevated high sensitivity troponin I (hsTnI) values and significant  changes across serial measurements may suggest ACS but many other  chronic and acute conditions are known to elevate hsTnI results.  Refer to the "Links" section for chest pain algorithms and additional  guidance. Performed at [facility], [address] [place], [place] [address]   Resp panel by RT-PCR (RSV, Flu A&B, Covid) Anterior Nasal Swab     Status: None   Collection Time: 08/21/22 10:33 AM    Specimen: Anterior Nasal Swab  Result Value Ref Range   SARS Coronavirus 2 by RT PCR NEGATIVE NEGATIVE    Comment: (NOTE) SARS-CoV-2 target  nucleic acids are NOT DETECTED.  The SARS-CoV-2 RNA is generally detectable in upper respiratory specimens during the acute phase of infection. The lowest concentration of SARS-CoV-2 viral copies this assay can detect is 138 copies/mL. A negative result does not preclude SARS-Cov-2 infection and should not be used as the sole basis for treatment or other patient management decisions. A negative result may occur with  improper specimen collection/handling, submission of specimen other than nasopharyngeal swab, presence of viral mutation(s) within the areas targeted by this assay, and inadequate number of viral copies(<138 copies/mL). A negative result must be combined with clinical observations, patient history, and epidemiological information. The expected result is Negative.  Fact Sheet for Patients:  BloggerCourse.com  Fact Sheet for Healthcare Providers:  SeriousBroker.it  This test is no t yet approved or cleared by the [place] FDA and  has been authorized for detection and/or diagnosis of SARS-CoV-2 by FDA under an Emergency Use Authorization (EUA). This EUA will remain  in effect (meaning this test can be used) for the duration of the COVID-19 declaration under Section 564(b)(1) of the Act, 21 U.S.C.section 360bbb-3(b)(1), unless the authorization is terminated  or revoked sooner.       Influenza A by PCR NEGATIVE NEGATIVE   Influenza B by PCR NEGATIVE NEGATIVE    Comment: (NOTE) The Xpert Xpress SARS-CoV-2/FLU/RSV plus assay is intended as an aid in the diagnosis of influenza from Nasopharyngeal swab specimens and should not be used as a sole basis for treatment. Nasal washings and aspirates are unacceptable for Xpert Xpress SARS-CoV-2/FLU/RSV testing.  Fact Sheet for  Patients: BloggerCourse.com  Fact Sheet for Healthcare Providers: SeriousBroker.it  This test is not yet approved or cleared by the [place] FDA and has been authorized for detection and/or diagnosis of SARS-CoV-2 by FDA under an Emergency Use Authorization (EUA). This EUA will remain in effect (meaning this test can be used) for the duration of the COVID-19 declaration under Section 564(b)(1) of the Act, 21 U.S.C. section 360bbb-3(b)(1), unless the authorization is terminated or revoked.     Resp Syncytial Virus by PCR NEGATIVE NEGATIVE    Comment: (NOTE) Fact Sheet for Patients: BloggerCourse.com  Fact Sheet for Healthcare Providers: SeriousBroker.it  This test is not yet approved or cleared by the [place] FDA and has been authorized for detection and/or diagnosis of SARS-CoV-2 by FDA under an Emergency Use Authorization (EUA). This EUA will remain in effect (meaning this test can be used) for the duration of the COVID-19 declaration under Section 564(b)(1) of the Act, 21 U.S.C. section 360bbb-3(b)(1), unless the authorization is terminated or revoked.  Performed at [facility], [address] [place], [place] [address]    CT Angio Chest PE W and/or Wo Contrast  Result Date: 08/21/2022 CLINICAL DATA:  Left lower extremity swelling, hypoxia. EXAM: CT ANGIOGRAPHY CHEST WITH CONTRAST TECHNIQUE: Multidetector CT imaging of the chest was performed using the standard protocol during bolus administration of intravenous contrast. Multiplanar CT image reconstructions and MIPs were obtained to evaluate the vascular anatomy. RADIATION DOSE REDUCTION: This exam was performed according to the departmental dose-optimization program which includes automated exposure control, adjustment of the mA and/or kV according to patient size and/or use of iterative  reconstruction technique. CONTRAST:  OMNIPAQUE IOHEXOL 350 MG/ML SOLN COMPARISON:  None Available. FINDINGS: Cardiovascular: Satisfactory opacification of the pulmonary arteries to the segmental level. No evidence of pulmonary embolism. The heart is mildly enlarged. No pericardial effusion. Main pulmonary trunk is dilated measuring up  to 3.8 cm concerning for pulmonary arterial hypertension. Mediastinum/Nodes: No enlarged mediastinal, hilar, or axillary lymph nodes. Thyroid gland, trachea, and esophagus demonstrate no significant findings. Lungs/Pleura: Lungs are clear. No pleural effusion or pneumothorax. Bibasilar dependent atelectasis. Upper Abdomen: No acute abnormality. Musculoskeletal: Idiopathic skeletal hyperostosis. No acute osseous abnormality. Review of the MIP images confirms the above findings. IMPRESSION: 1. No evidence of pulmonary embolism. 2. Main pulmonary trunk is dilated measuring up to 3.8 cm concerning for pulmonary arterial hypertension. 3. Lungs are clear without evidence of acute pulmonary process. Electronically Signed   By: Larose Hires D.O.   On: 08/21/2022 13:55   DG Chest 2 View  Result Date: 08/21/2022 CLINICAL DATA:  shortness of breath, fluid overload EXAM: PORTABLE CHEST 1 VIEW COMPARISON:  08/17/2022 FINDINGS: Cardiac silhouette is prominent. There is pulmonary interstitial prominence with vascular congestion. No focal consolidation. No pneumothorax or pleural effusion identified. Aorta is calcified. IMPRESSION: Findings suggest CHF. Electronically Signed   By: Layla Maw M.D.   On: 08/21/2022 10:44    Pending Labs Unresulted Labs (From admission, onward)     Start     Ordered   08/21/22 1225  Blood gas, venous  Once,   R        08/21/22 1224   Signed and Held  Hemoglobin A1c  Once,   R       Comments: To assess prior glycemic control    Signed and Held   Signed and Held  HIV Antibody (routine testing w rflx)  (HIV Antibody (Routine testing w reflex)  panel)  Once,   R        Signed and Held   Signed and Held  Comprehensive metabolic panel  Tomorrow morning,   R        Signed and Held   Signed and Held  CBC  Tomorrow morning,   R        Signed and Held            Vitals/Pain Today's Vitals   08/21/22 1445 08/21/22 1500 08/21/22 1530 08/21/22 1600  BP:  (!) 169/107 (!) 170/114 (!) 151/90  Pulse: 64 62 65 73  Resp: (!) 23 (!) 22 18 10   Temp:    98.6 F (37 C)  TempSrc:      SpO2: 100% 99% 99% 94%  Weight:      Height:      PainSc:        Isolation Precautions No active isolations  Medications Medications  furosemide (LASIX) injection 40 mg (40 mg Intravenous Given 08/21/22 1139)  nitroGLYCERIN (NITROGLYN) 2 % ointment 1 inch (1 inch Topical Given 08/21/22 1139)  iohexol (OMNIPAQUE) 350 MG/ML injection 100 mL (100 mLs Intravenous Contrast Given 08/21/22 1322)    Mobility walks Low fall risk   Focused Assessments     R Recommendations: See Admitting Provider Note  Report given to:   Additional Notes:

## 2022-08-21 NOTE — ED Notes (Signed)
Pt placed on 2L [place]

## 2022-08-21 NOTE — Progress Notes (Signed)
   08/21/22 2133  Vitals  Temp 98.1 F (36.7 C)  Temp Source Oral  BP (!) 163/89  MAP (mmHg) 111  BP Location Right Arm  BP Method Automatic  Patient Position (if appropriate) Lying  Pulse Rate 69  Pulse Rate Source Monitor  Resp 16  MEWS COLOR  MEWS Score Color Green  Oxygen Therapy  SpO2 95 %  O2 Device CPAP  MEWS Score  MEWS Temp 0  MEWS Systolic 0  MEWS Pulse 0  MEWS RR 0  MEWS LOC 0  MEWS Score 0   the pt's BP is 163/89 (111), per pt stated, this is not his normal BP and he usually took blood pressure medication at home. I look at the pt's MAR and did not see any BP medication in order. NP notified. Per NP's order, hydralazine given.

## 2022-08-22 ENCOUNTER — Inpatient Hospital Stay (HOSPITAL_COMMUNITY): Payer: Medicare HMO

## 2022-08-22 DIAGNOSIS — I5033 Acute on chronic diastolic (congestive) heart failure: Secondary | ICD-10-CM | POA: Diagnosis not present

## 2022-08-22 DIAGNOSIS — I5030 Unspecified diastolic (congestive) heart failure: Secondary | ICD-10-CM | POA: Diagnosis not present

## 2022-08-22 LAB — COMPREHENSIVE METABOLIC PANEL
ALT: 25 U/L (ref 0–44)
AST: 18 U/L (ref 15–41)
Albumin: 3.4 g/dL — ABNORMAL LOW (ref 3.5–5.0)
Alkaline Phosphatase: 87 U/L (ref 38–126)
Anion gap: 7 (ref 5–15)
BUN: 19 mg/dL (ref 8–23)
CO2: 34 mmol/L — ABNORMAL HIGH (ref 22–32)
Calcium: 8.9 mg/dL (ref 8.9–10.3)
Chloride: 101 mmol/L (ref 98–111)
Creatinine, Ser: 1.33 mg/dL — ABNORMAL HIGH (ref 0.61–1.24)
GFR, Estimated: >60 mL/min (ref >60)
Glucose, Bld: 100 mg/dL — ABNORMAL HIGH (ref 70–99)
Potassium: 3.6 mmol/L (ref 3.5–5.1)
Sodium: 142 mmol/L (ref 135–145)
Total Bilirubin: 1.2 mg/dL (ref 0.3–1.2)
Total Protein: 6.7 g/dL (ref 6.5–8.1)

## 2022-08-22 LAB — ECHOCARDIOGRAM COMPLETE
AR max vel: 2.87 cm2
AV Area VTI: 3.06 cm2
AV Area mean vel: 2.57 cm2
AV Mean grad: 11 mmHg
AV Peak grad: 19 mmHg
Ao pk vel: 2.18 m/s
Area-P 1/2: 3.42 cm2
Calc EF: 49.9 %
Height: 71 in
MV M vel: 3.72 m/s
MV Peak grad: 55.4 mmHg
S' Lateral: 3.8 cm
Single Plane A2C EF: 50.3 %
Single Plane A4C EF: 50.2 %
Weight: 4208 oz

## 2022-08-22 LAB — CBC
HCT: 51.7 % (ref 39.0–52.0)
Hemoglobin: 15.1 g/dL (ref 13.0–17.0)
MCH: 28.6 pg (ref 26.0–34.0)
MCHC: 29.2 g/dL — ABNORMAL LOW (ref 30.0–36.0)
MCV: 97.9 fL (ref 80.0–100.0)
Platelets: 129 10*3/uL — ABNORMAL LOW (ref 150–400)
RBC: 5.28 MIL/uL (ref 4.22–5.81)
RDW: 15.2 % (ref 11.5–15.5)
WBC: 4.6 10*3/uL (ref 4.0–10.5)
nRBC: 0 % (ref 0.0–0.2)

## 2022-08-22 LAB — DIFFERENTIAL
Abs Immature Granulocytes: 0.02 10*3/uL (ref 0.00–0.07)
Basophils Absolute: 0 10*3/uL (ref 0.0–0.1)
Basophils Relative: 1 %
Eosinophils Absolute: 0.1 10*3/uL (ref 0.0–0.5)
Eosinophils Relative: 3 %
Immature Granulocytes: 1 %
Lymphocytes Relative: 27 %
Lymphs Abs: 1.2 10*3/uL (ref 0.7–4.0)
Monocytes Absolute: 0.4 10*3/uL (ref 0.1–1.0)
Monocytes Relative: 8 %
Neutro Abs: 2.7 10*3/uL (ref 1.7–7.7)
Neutrophils Relative %: 61 %

## 2022-08-22 LAB — TECHNOLOGIST SMEAR REVIEW: Plt Morphology: DECREASED

## 2022-08-22 LAB — GLUCOSE, CAPILLARY
Glucose-Capillary: 165 mg/dL — ABNORMAL HIGH (ref 70–99)
Glucose-Capillary: 141 mg/dL — ABNORMAL HIGH (ref 70–99)
Glucose-Capillary: 163 mg/dL — ABNORMAL HIGH (ref 70–99)
Glucose-Capillary: 155 mg/dL — ABNORMAL HIGH (ref 70–99)

## 2022-08-22 LAB — HEMOGLOBIN A1C
Hgb A1c MFr Bld: 7.1 % — ABNORMAL HIGH (ref 4.8–5.6)
Mean Plasma Glucose: 157 mg/dL

## 2022-08-22 LAB — HIV ANTIBODY (ROUTINE TESTING W REFLEX): HIV Screen 4th Generation wRfx: REACTIVE — AB

## 2022-08-22 MED ORDER — GABAPENTIN 100 MG PO CAPS
100.0000 mg | ORAL_CAPSULE | Freq: Every day | ORAL | Status: DC
  Administered 2022-08-22 – 2022-08-25 (×4): 100 mg via ORAL
  Filled 2022-08-22 (×4): qty 1

## 2022-08-22 MED ORDER — ATORVASTATIN CALCIUM 20 MG PO TABS
20.0000 mg | ORAL_TABLET | Freq: Every day | ORAL | Status: DC
  Administered 2022-08-22 – 2022-08-26 (×5): 20 mg via ORAL
  Filled 2022-08-22 (×5): qty 1

## 2022-08-22 MED ORDER — LOSARTAN POTASSIUM 25 MG PO TABS
25.0000 mg | ORAL_TABLET | Freq: Every day | ORAL | Status: DC
  Administered 2022-08-22: 25 mg via ORAL
  Filled 2022-08-22: qty 1

## 2022-08-22 MED ORDER — PNEUMOCOCCAL 20-VAL CONJ VACC 0.5 ML IM SUSY
0.5000 mL | PREFILLED_SYRINGE | INTRAMUSCULAR | Status: AC
  Administered 2022-08-24: 0.5 mL via INTRAMUSCULAR
  Filled 2022-08-22: qty 0.5

## 2022-08-22 MED ORDER — HYDRALAZINE HCL 20 MG/ML IJ SOLN
5.0000 mg | Freq: Once | INTRAMUSCULAR | Status: AC
  Administered 2022-08-22: 5 mg via INTRAVENOUS
  Filled 2022-08-22: qty 1

## 2022-08-22 MED ORDER — CARVEDILOL 6.25 MG PO TABS
6.2500 mg | ORAL_TABLET | Freq: Two times a day (BID) | ORAL | Status: DC
  Administered 2022-08-22 – 2022-08-26 (×8): 6.25 mg via ORAL
  Filled 2022-08-22 (×9): qty 1

## 2022-08-22 NOTE — Consult Note (Signed)
CARDIOLOGY CONSULT NOTE       Patient ID: Mark [name] MRN: [id number] DOB/AGE: 63-Jan-1962 63 y.o.  Admit date: 08/21/2022 Referring Physician: Marylyn Ishihara Primary Physician: [name], FNP Primary Cardiologist: Marisue Ivan Reason for Consultation: CHF  Principal Problem:   Acute on chronic diastolic (congestive) heart failure ([place]) Active Problems:   Essential hypertension   Hyperlipidemia   DM2 (diabetes mellitus, type 2) ([place])   OSA (obstructive sleep apnea)   HPI:  61 y.o. admitted to [facility] for congestive heart failure and volume overload. Seen by Dr Marisue Ivan yesterday.  History of obesity, OSA, DM, HTN and HLD Was tachypnic with sats 72% and told to go to ER. He has Chronic LBBB No chest pain CXR suggests CHF with cephalization CTA negative for PE. BNP only 241 Respiratory panel negative Troponin negative 59-> 44.  ABG suggests chronic respiratory failure With pH 7.42 CO2 63 and sat 89% oxygen 54 No documented CAD Myovue no ischemia 2019 Echo pending this am. TTE 05/29/19 EF 65-70% moderate LVH abnormal septal motion from LBBB normal RV mild MR  ROS All other systems reviewed and negative except as noted above  Past Medical History:  Diagnosis Date   Abnormal lung function test    a. Reported to possibly be COPD, but per pulm note: "Possible COPD - PFT was more suggestive of restrictive defect and diffusion defect likely from obesity and CHF"   Chronic diastolic heart failure ([place]) 05/07/2018   CKD (chronic kidney disease), stage III (HCC)    Diabetes mellitus type 2 in obese (HCC)    Elevated troponin 05/07/2018   Hearing impaired    Hyperlipidemia    Hypertension    LBBB (left bundle branch block)    LVH (left ventricular hypertrophy)    Mild aortic stenosis    Mild pulmonary hypertension (HCC)    Morbid obesity (HCC)    Sleep apnea     Family History  Problem Relation Age of Onset   Hypertension Mother    Hypertension Father     Social History   Socioeconomic History    Marital status: Married    Spouse name: Not on file   Number of children: Not on file   Years of education: Not on file   Highest education level: Not on file  Occupational History   Occupation: employed  Tobacco Use   Smoking status: Former    Years: 1.00    Types: Cigarettes    Quit date: 1999    Years since quitting: 24.9   Smokeless tobacco: Never   Tobacco comments:    1 pack would last one month--08/01/18  Vaping Use   Vaping Use: Never used  Substance and Sexual Activity   Alcohol use: No   Drug use: No   Sexual activity: Not Currently  Other Topics Concern   Not on file  Social History Narrative   Not on file   Social Determinants of Health   Financial Resource Strain: Low Risk  (04/08/2022)   Overall Financial Resource Strain (CARDIA)    Difficulty of Paying Living Expenses: Not hard at all  Food Insecurity: No Food Insecurity (08/21/2022)   Hunger Vital Sign    Worried About Running Out of Food in the Last Year: Never true    Ran Out of Food in the Last Year: Never true  Transportation Needs: No Transportation Needs (08/21/2022)   PRAPARE - Hydrologist (Medical): No    Lack of Transportation (Non-Medical): No  Physical Activity: Inactive (04/08/2022)   Exercise Vital Sign    Days of Exercise per Week: 0 days    Minutes of Exercise per Session: 0 min  Stress: No Stress Concern Present (04/08/2022)   [employer] of Occupational Health - Occupational Stress Questionnaire    Feeling of Stress : Not at all  Social Connections: Not on file  Intimate Partner Violence: Not At Risk (08/21/2022)   Humiliation, Afraid, Rape, and Kick questionnaire    Fear of Current or Ex-Partner: No    Emotionally Abused: No    Physically Abused: No    Sexually Abused: No    Past Surgical History:  Procedure Laterality Date   BACK SURGERY     TRACHEOSTOMY        Current Facility-Administered Medications:    acetaminophen (TYLENOL) tablet 650  mg, 650 mg, Oral, Q6H PRN **OR** acetaminophen (TYLENOL) suppository 650 mg, 650 mg, Rectal, Q6H PRN, Ronaldo Miyamoto, Tyrone A, DO   enoxaparin (LOVENOX) injection 60 mg, 60 mg, Subcutaneous, Q24H, Kyle, Tyrone A, DO, 60 mg at 08/21/22 2142   furosemide (LASIX) injection 40 mg, 40 mg, Intravenous, BID, Kyle, Tyrone A, DO, 40 mg at 08/22/22 0845   insulin aspart (novoLOG) injection 0-15 Units, 0-15 Units, Subcutaneous, TID WC, Kyle, Tyrone A, DO, 3 Units at 08/22/22 0842   insulin aspart (novoLOG) injection 0-5 Units, 0-5 Units, Subcutaneous, QHS, Kyle, Tyrone A, DO  enoxaparin (LOVENOX) injection  60 mg Subcutaneous Q24H   furosemide  40 mg Intravenous BID   insulin aspart  0-15 Units Subcutaneous TID WC   insulin aspart  0-5 Units Subcutaneous QHS     Physical Exam: Blood pressure (!) 167/96, pulse 70, temperature 99.5 F (37.5 C), temperature source Oral, resp. rate 17, height 5\' 11"  (1.803 m), weight 119.3 kg, SpO2 97 %.    Obese black male Sitting in chair JVP not visible Decreased BS bases Distant heart sounds  Plus 2 edema  Labs:   Lab Results  Component Value Date   WBC 4.6 08/22/2022   HGB 15.1 08/22/2022   HCT 51.7 08/22/2022   MCV 97.9 08/22/2022   PLT 129 (L) 08/22/2022    Recent Labs  Lab 08/22/22 0509  NA 142  K 3.6  CL 101  CO2 34*  BUN 19  CREATININE 1.33*  CALCIUM 8.9  PROT 6.7  BILITOT 1.2  ALKPHOS 87  ALT 25  AST 18  GLUCOSE 100*   Lab Results  Component Value Date   CKTOTAL 318 (H) 01/07/2008   CKMB 8.0 (H) 01/07/2008   TROPONINI 0.11 (HH) 05/08/2018    Lab Results  Component Value Date   CHOL 160 01/05/2022   CHOL 178 03/12/2021   CHOL 167 12/23/2020   Lab Results  Component Value Date   HDL 56 01/05/2022   HDL 51 03/12/2021   HDL 53 12/23/2020   Lab Results  Component Value Date   LDLCALC 86 01/05/2022   LDLCALC 105 (H) 03/12/2021   LDLCALC 98 12/23/2020   Lab Results  Component Value Date   TRIG 99 01/05/2022   TRIG 126  03/12/2021   TRIG 86 12/23/2020   Lab Results  Component Value Date   CHOLHDL 2.9 01/05/2022   CHOLHDL 3.5 03/12/2021   CHOLHDL 3.2 12/23/2020   No results found for: "LDLDIRECT"    Radiology: CT Angio Chest PE W and/or Wo Contrast  Result Date: 08/21/2022 CLINICAL DATA:  Left lower extremity swelling, hypoxia. EXAM: CT ANGIOGRAPHY CHEST WITH CONTRAST TECHNIQUE:  Multidetector CT imaging of the chest was performed using the standard protocol during bolus administration of intravenous contrast. Multiplanar CT image reconstructions and MIPs were obtained to evaluate the vascular anatomy. RADIATION DOSE REDUCTION: This exam was performed according to the departmental dose-optimization program which includes automated exposure control, adjustment of the mA and/or kV according to patient size and/or use of iterative reconstruction technique. CONTRAST:  15mL OMNIPAQUE IOHEXOL 350 MG/ML SOLN COMPARISON:  None Available. FINDINGS: Cardiovascular: Satisfactory opacification of the pulmonary arteries to the segmental level. No evidence of pulmonary embolism. The heart is mildly enlarged. No pericardial effusion. Main pulmonary trunk is dilated measuring up to 3.8 cm concerning for pulmonary arterial hypertension. Mediastinum/Nodes: No enlarged mediastinal, hilar, or axillary lymph nodes. Thyroid gland, trachea, and esophagus demonstrate no significant findings. Lungs/Pleura: Lungs are clear. No pleural effusion or pneumothorax. Bibasilar dependent atelectasis. Upper Abdomen: No acute abnormality. Musculoskeletal: Idiopathic skeletal hyperostosis. No acute osseous abnormality. Review of the MIP images confirms the above findings. IMPRESSION: 1. No evidence of pulmonary embolism. 2. Main pulmonary trunk is dilated measuring up to 3.8 cm concerning for pulmonary arterial hypertension. 3. Lungs are clear without evidence of acute pulmonary process. Electronically Signed   By: Keane Police D.O.   On: 08/21/2022  13:55   DG Chest 2 View  Result Date: 08/21/2022 CLINICAL DATA:  shortness of breath, fluid overload EXAM: PORTABLE CHEST 1 VIEW COMPARISON:  08/17/2022 FINDINGS: Cardiac silhouette is prominent. There is pulmonary interstitial prominence with vascular congestion. No focal consolidation. No pneumothorax or pleural effusion identified. Aorta is calcified. IMPRESSION: Findings suggest CHF. Electronically Signed   By: Sammie Bench M.D.   On: 08/21/2022 10:44    EKG: SR LBBB chronic rate 70   ASSESSMENT AND PLAN:   HFpEF:  TTE pending this am on lasix bid iv Avoid contraction alkalosis with CO2 retention. BNP minimally elevated Definitely component of chronic respiratory failure Bipap as needed will transition back to oral demadex in 48 hours Respiratory panel negative no infiltrated on CXR DM:  per primary service Poorly controlled A1c 7.1 HTN:  resume cozaar Cr 1.3 follow  Elevated troponin :  no chest pan LBBB chronic no evidence of acute coronary syndrome 59-> 44    Signed: Jenkins Rouge 08/22/2022, 8:53 AM

## 2022-08-22 NOTE — Progress Notes (Signed)
   08/22/22 0538  Vitals  Temp 99.5 F (37.5 C)  Temp Source Oral  BP (!) 167/96  MAP (mmHg) 115  BP Location Right Arm  BP Method Automatic  Patient Position (if appropriate) Sitting  Pulse Rate 70  Resp (!) 22  MEWS COLOR  MEWS Score Color Green  Oxygen Therapy  SpO2 97 %  O2 Device Nasal Cannula  O2 Flow Rate (L/min) 3 L/min  MEWS Score  MEWS Temp 0  MEWS Systolic 0  MEWS Pulse 0  MEWS RR 1  MEWS LOC 0  MEWS Score 1   RN notified NP; RN given 5 mg hydralazine per order.

## 2022-08-22 NOTE — Progress Notes (Signed)
Mobility Specialist - Progress Note  Pre-mobility: 96% SpO2 During mobility: 88% SpO2 Post-mobility: 94% SPO2   08/22/22 1411  Oxygen Therapy  O2 Device Nasal Cannula  O2 Flow Rate (L/min) 3 L/min  Patient Activity (if Appropriate) Ambulating  Mobility  Activity Ambulated independently in hallway  Level of Assistance Standby assist, set-up cues, supervision of patient - no hands on  Assistive Device None  Distance Ambulated (ft) 350 ft  Range of Motion/Exercises Active  Activity Response Tolerated well  Mobility Referral Yes  $Mobility charge 1 Mobility   Pt was found on recliner chair and agreeable to ambulate. During ambulation SPO2 dropped to 88% but he was able to increase it >90% after a brief standing rest break to practice pursed lip breathing. At EOS returned to recliner chair and after a couple seconds of resting was able to maintain SPO2 >93%. Was left with necessities in reach.  Billey Chang Mobility Specialist

## 2022-08-22 NOTE — Progress Notes (Signed)
  Echocardiogram 2D Echocardiogram has been performed.  Mark [name] 08/22/2022, 9:22 AM

## 2022-08-22 NOTE — Progress Notes (Signed)
PROGRESS NOTE    Mark [name]  SHU:[id number] DOB: [birth year] DOA: 08/21/2022 PCP: [name], FNP  Outpatient Specialists: cardiology    Brief Narrative:   From admission h and p  Mark [name] is a 61 y.o. male with medical history significant of DM2, HTN, HLD, HFpEF. Presenting with 2 weeks of DOE. He reports that he has been dyspneic with normal activities. He hasn't had any chest pain or palpitations. He reports some swelling in his legs, but he attributes that to gout. He has not had any fever or cough. He has not had any sick contacts. He saw his primary care team during this time and they recommended that he see a cardiologist. He went to the cardiology appointment this morning. During their evaluation, they became concerned for acute HF exacerbation. So they recommended he go to the ED for evaluation. He denies any other aggravating or alleviating factors.     Assessment & Plan:   Principal Problem:   Acute on chronic diastolic (congestive) heart failure (HCC) Active Problems:   Essential hypertension   Hyperlipidemia   Chronic diastolic heart failure (HCC)   DM2 (diabetes mellitus, type 2) (HCC)   Obesity (BMI 30-39.9)   PAD (peripheral artery disease) (HCC)   OSA (obstructive sleep apnea)  # HFrEF # Asymmetric left ventricular septal hypertrophy Few weeks worsening DOE. BNP elevated. O2 in 70s on arrival. CTA no signs PE. TTE with severe asymmetric levt ventricle septal hypertrophy and moderate LVH. Responding to IV lasix. Outs haven't been recorded. Cardiology following. No chest pain and only mild stable tropinemia, think likely demand - continue lasix IV, monitor I/O. Hold home torsemide - f/u cardiology recs regarding asymmetric hypertrophy  - resume  home coreg  # Acute on chronic hypoxic respiratory failure O2 70s on presentation. Is on intermittent O2 at home. Currently breathing comfortably on 3 L. CTA with relatively clear lungs, no PE. - [place] O2,  wean as able  # HTN Here mod bp elevation - cont home losartan, resume home coreg - diuresis as above  # T2DM Mild glucose elevation - hold home metformin - SSI  # Chronic pain - home gabapentin  # OSA Not on cpap but it's been ordered here  # Thrombocytopenia Mild, 120s - check hiv, hcv, smear   DVT prophylaxis: lovenox Code Status: full Family Communication: none @ bedside  Level of care: Telemetry Status is: Inpatient Remains inpatient appropriate because: need for IV diuresis Dispo: likely home. PT consult ordered    Consultants:  cardiology  Procedures: none  Antimicrobials:  none    Subjective: Breathing improved, no chest pain, no complaints  Objective: Vitals:   08/21/22 2133 08/22/22 0122 08/22/22 0538 08/22/22 0800  BP: (!) 163/89 (!) 150/88 (!) 167/96   Pulse: 69 86 70   Resp: 16 20 (!) 22 17  Temp: 98.1 F (36.7 C) 98 F (36.7 C) 99.5 F (37.5 C)   TempSrc: Oral Oral Oral   SpO2: 95% 91% 97%   Weight:      Height:        Intake/Output Summary (Last 24 hours) at 08/22/2022 1258 Last data filed at 08/22/2022 1000 Gross per 24 hour  Intake 360 ml  Output --  Net 360 ml   Filed Weights   08/21/22 0934 08/21/22 1737  Weight: 120 kg 119.3 kg    Examination:  General exam: Appears calm and comfortable  Respiratory system: Clear to auscultation. Respiratory effort normal. Cardiovascular system: S1 & S2  heard, RRR. No JVD, murmurs, rubs, gallops or clicks.   Gastrointestinal system: Abdomen is nondistended, soft and nontender. No organomegaly or masses felt. Normal bowel sounds heard. Central nervous system: Alert and oriented. No focal neurological deficits. Extremities: Symmetric 5 x 5 power. Mild/mod pitting edema to knees Skin: No rashes, lesions or ulcers Psychiatry: Judgement and insight appear normal. Mood & affect appropriate.     Data Reviewed: I have personally reviewed following labs and imaging  studies  CBC: Recent Labs  Lab 08/21/22 1001 08/22/22 0509  WBC 4.8 4.6  NEUTROABS 3.2  --   HGB 14.3 15.1  HCT 49.2 51.7  MCV 96.9 97.9  PLT 126* 129*   Basic Metabolic Panel: Recent Labs  Lab 08/21/22 1001 08/22/22 0509  NA 145 142  K 3.8 3.6  CL 103 101  CO2 35* 34*  GLUCOSE 108* 100*  BUN 20 19  CREATININE 1.32* 1.33*  CALCIUM 8.8* 8.9   GFR: Estimated Creatinine Clearance: 74.7 mL/min (A) (by C-G formula based on SCr of 1.33 mg/dL (H)). Liver Function Tests: Recent Labs  Lab 08/21/22 1001 08/22/22 0509  AST 16 18  ALT 23 25  ALKPHOS 79 87  BILITOT 1.0 1.2  PROT 6.1* 6.7  ALBUMIN 3.2* 3.4*   No results for input(s): "LIPASE", "AMYLASE" in the last 168 hours. No results for input(s): "AMMONIA" in the last 168 hours. Coagulation Profile: No results for input(s): "INR", "PROTIME" in the last 168 hours. Cardiac Enzymes: No results for input(s): "CKTOTAL", "CKMB", "CKMBINDEX", "TROPONINI" in the last 168 hours. BNP (last 3 results) No results for input(s): "PROBNP" in the last 8760 hours. HbA1C: Recent Labs    08/21/22 1755  HGBA1C 7.1*   CBG: Recent Labs  Lab 08/21/22 2129 08/22/22 0753 08/22/22 1237  GLUCAP 127* 165* 141*   Lipid Profile: No results for input(s): "CHOL", "HDL", "LDLCALC", "TRIG", "CHOLHDL", "LDLDIRECT" in the last 72 hours. Thyroid Function Tests: No results for input(s): "TSH", "T4TOTAL", "FREET4", "T3FREE", "THYROIDAB" in the last 72 hours. Anemia Panel: No results for input(s): "VITAMINB12", "FOLATE", "FERRITIN", "TIBC", "IRON", "RETICCTPCT" in the last 72 hours. Urine analysis:    Component Value Date/Time   COLORURINE YELLOW 01/07/2008 1805   APPEARANCEUR CLOUDY (A) 01/07/2008 1805   LABSPEC 1.019 01/07/2008 1805   PHURINE 5.0 01/07/2008 1805   GLUCOSEU NEGATIVE 01/07/2008 1805   HGBUR SMALL (A) 01/07/2008 1805   BILIRUBINUR negative 04/08/2022 1122   KETONESUR NEGATIVE 01/07/2008 1805   PROTEINUR Negative  04/08/2022 1122   PROTEINUR 30 (A) 01/07/2008 1805   UROBILINOGEN 0.2 04/08/2022 1122   UROBILINOGEN 0.2 01/07/2008 1805   NITRITE negative 04/08/2022 1122   NITRITE NEGATIVE 01/07/2008 1805   LEUKOCYTESUR Negative 04/08/2022 1122   Sepsis Labs: @LABRCNTIP (procalcitonin:4,lacticidven:4)  ) Recent Results (from the past 240 hour(s))  Resp panel by RT-PCR (RSV, Flu A&B, Covid) Anterior Nasal Swab     Status: None   Collection Time: 08/21/22 10:33 AM   Specimen: Anterior Nasal Swab  Result Value Ref Range Status   SARS Coronavirus 2 by RT PCR NEGATIVE NEGATIVE Final    Comment: (NOTE) SARS-CoV-2 target nucleic acids are NOT DETECTED.  The SARS-CoV-2 RNA is generally detectable in upper respiratory specimens during the acute phase of infection. The lowest concentration of SARS-CoV-2 viral copies this assay can detect is 138 copies/mL. A negative result does not preclude SARS-Cov-2 infection and should not be used as the sole basis for treatment or other patient management decisions. A negative result may occur with  improper specimen collection/handling, submission of specimen other than nasopharyngeal swab, presence of viral mutation(s) within the areas targeted by this assay, and inadequate number of viral copies(<138 copies/mL). A negative result must be combined with clinical observations, patient history, and epidemiological information. The expected result is Negative.  Fact Sheet for Patients:  BloggerCourse.com  Fact Sheet for Healthcare Providers:  SeriousBroker.it  This test is no t yet approved or cleared by the [place] FDA and  has been authorized for detection and/or diagnosis of SARS-CoV-2 by FDA under an Emergency Use Authorization (EUA). This EUA will remain  in effect (meaning this test can be used) for the duration of the COVID-19 declaration under Section 564(b)(1) of the Act, 21 U.S.C.section  360bbb-3(b)(1), unless the authorization is terminated  or revoked sooner.       Influenza A by PCR NEGATIVE NEGATIVE Final   Influenza B by PCR NEGATIVE NEGATIVE Final    Comment: (NOTE) The Xpert Xpress SARS-CoV-2/FLU/RSV plus assay is intended as an aid in the diagnosis of influenza from Nasopharyngeal swab specimens and should not be used as a sole basis for treatment. Nasal washings and aspirates are unacceptable for Xpert Xpress SARS-CoV-2/FLU/RSV testing.  Fact Sheet for Patients: BloggerCourse.com  Fact Sheet for Healthcare Providers: SeriousBroker.it  This test is not yet approved or cleared by the [place] FDA and has been authorized for detection and/or diagnosis of SARS-CoV-2 by FDA under an Emergency Use Authorization (EUA). This EUA will remain in effect (meaning this test can be used) for the duration of the COVID-19 declaration under Section 564(b)(1) of the Act, 21 U.S.C. section 360bbb-3(b)(1), unless the authorization is terminated or revoked.     Resp Syncytial Virus by PCR NEGATIVE NEGATIVE Final    Comment: (NOTE) Fact Sheet for Patients: BloggerCourse.com  Fact Sheet for Healthcare Providers: SeriousBroker.it  This test is not yet approved or cleared by the [place] FDA and has been authorized for detection and/or diagnosis of SARS-CoV-2 by FDA under an Emergency Use Authorization (EUA). This EUA will remain in effect (meaning this test can be used) for the duration of the COVID-19 declaration under Section 564(b)(1) of the Act, 21 U.S.C. section 360bbb-3(b)(1), unless the authorization is terminated or revoked.  Performed at [facility], [address], [place], [place] [address]          Radiology Studies: ECHOCARDIOGRAM COMPLETE  Result Date: 08/22/2022    ECHOCARDIOGRAM REPORT   Patient Name:   Mark [name]  [facility] Date of Exam: 08/22/2022 Medical Rec #:  [id number]         Height:       71.0 in Accession #:    [id number]        Weight:       263.0 lb Date of Birth:  06/01/1961          BSA:          2.369 m Patient Age:    61 years          BP:           167/96 mmHg Patient Gender: M                 HR:           78 bpm. Exam Location:  Inpatient Procedure: 2D Echo Indications:    CHF  History:        Patient has prior history of Echocardiogram examinations, most  recent 05/29/2019. Risk Factors:Diabetes, Hypertension and                 Dyslipidemia.  Sonographer:    Cathie Hoops Referring Phys: [id number] Delila Pereyra A KYLE IMPRESSIONS  1. Severe asymmetrical septal hypertrophy with otherwise moderate LVH of other wall segments. No dynamic LVOT gradient. . Left ventricular ejection fraction, by estimation, is 50 to 55%. The left ventricle has low normal function. The left ventricle has  no regional wall motion abnormalities. There is severe asymmetric left ventricular hypertrophy of the septal segment. Left ventricular diastolic parameters are consistent with Grade I diastolic dysfunction (impaired relaxation). Elevated left atrial pressure.  2. Right ventricular systolic function is normal. The right ventricular size is normal. Tricuspid regurgitation signal is inadequate for assessing PA pressure.  3. The mitral valve is abnormal. Mild mitral valve regurgitation. No evidence of mitral stenosis.  4. The aortic valve is tricuspid. There is mild calcification of the aortic valve. There is mild thickening of the aortic valve. Aortic valve regurgitation is not visualized. No aortic stenosis is present.  5. The inferior vena cava is normal in size with greater than 50% respiratory variability, suggesting right atrial pressure of 3 mmHg. FINDINGS  Left Ventricle: Severe asymmetrical septal hypertrophy with otherwise moderate LVH of other wall segments. No dynamic LVOT gradient. Left ventricular ejection fraction,  by estimation, is 50 to 55%. The left ventricle has low normal function. The left ventricle has no regional wall motion abnormalities. The left ventricular internal cavity size was normal in size. There is severe asymmetric left ventricular hypertrophy of the septal segment. Left ventricular diastolic parameters are consistent with Grade I diastolic dysfunction (impaired relaxation). Elevated left atrial pressure. Right Ventricle: The right ventricular size is normal. Right vetricular wall thickness was not well visualized. Right ventricular systolic function is normal. Tricuspid regurgitation signal is inadequate for assessing PA pressure. Left Atrium: Left atrial size was normal in size. Right Atrium: Right atrial size was normal in size. Pericardium: There is no evidence of pericardial effusion. Mitral Valve: The mitral valve is abnormal. There is mild thickening of the mitral valve leaflet(s). There is mild calcification of the mitral valve leaflet(s). Mild mitral annular calcification. Mild mitral valve regurgitation. No evidence of mitral valve stenosis. Tricuspid Valve: The tricuspid valve is normal in structure. Tricuspid valve regurgitation is trivial. No evidence of tricuspid stenosis. Aortic Valve: The aortic valve is tricuspid. There is mild calcification of the aortic valve. There is mild thickening of the aortic valve. There is mild aortic valve annular calcification. Aortic valve regurgitation is not visualized. No aortic stenosis  is present. Aortic valve mean gradient measures 11.0 mmHg. Aortic valve peak gradient measures 19.0 mmHg. Aortic valve area, by VTI measures 3.06 cm. Pulmonic Valve: The pulmonic valve was not well visualized. Pulmonic valve regurgitation is not visualized. No evidence of pulmonic stenosis. Aorta: The aortic root is normal in size and structure. Venous: The inferior vena cava is normal in size with greater than 50% respiratory variability, suggesting right atrial pressure  of 3 mmHg. IAS/Shunts: No atrial level shunt detected by color flow Doppler.  LEFT VENTRICLE PLAX 2D LVIDd:         5.10 cm      Diastology LVIDs:         3.80 cm      LV e' medial:    4.13 cm/s LV PW:         1.30 cm      LV E/e' medial:  17.8 LV IVS:        1.90 cm      LV e' lateral:   7.40 cm/s LVOT diam:     2.00 cm      LV E/e' lateral: 9.9 LV SV:         109 LV SV Index:   46 LVOT Area:     3.14 cm  LV Volumes (MOD) LV vol d, MOD A2C: 134.0 ml LV vol d, MOD A4C: 125.0 ml LV vol s, MOD A2C: 66.6 ml LV vol s, MOD A4C: 62.3 ml LV SV MOD A2C:     67.4 ml LV SV MOD A4C:     125.0 ml LV SV MOD BP:      65.6 ml RIGHT VENTRICLE RV Basal diam:  5.90 cm RV Mid diam:    4.45 cm RV S prime:     13.40 cm/s TAPSE (M-mode): 2.3 cm LEFT ATRIUM             Index        RIGHT ATRIUM           Index LA diam:        4.80 cm 2.03 cm/m   RA Area:     25.60 cm LA Vol (A2C):   54.0 ml 22.80 ml/m  RA Volume:   92.10 ml  38.88 ml/m LA Vol (A4C):   51.4 ml 21.70 ml/m LA Biplane Vol: 53.9 ml 22.75 ml/m  AORTIC VALVE                     PULMONIC VALVE AV Area (Vmax):    2.87 cm      PV Vmax:          1.89 m/s AV Area (Vmean):   2.57 cm      PV Peak grad:     14.3 mmHg AV Area (VTI):     3.06 cm      PR End Diast Vel: 13.54 msec AV Vmax:           218.00 cm/s AV Vmean:          159.000 cm/s AV VTI:            0.356 m AV Peak Grad:      19.0 mmHg AV Mean Grad:      11.0 mmHg LVOT Vmax:         199.00 cm/s LVOT Vmean:        130.000 cm/s LVOT VTI:          0.347 m LVOT/AV VTI ratio: 0.97  AORTA Ao Root diam: 3.30 cm Ao Asc diam:  3.30 cm MITRAL VALVE MV Area (PHT): 3.42 cm     SHUNTS MV Decel Time: 222 msec     Systemic VTI:  0.35 m MR Peak grad: 55.4 mmHg     Systemic Diam: 2.00 cm MR Vmax:      372.00 cm/s MV E velocity: 73.50 cm/s MV A velocity: 115.00 cm/s MV E/A ratio:  0.64 Dina Rich MD Electronically signed by Dina Rich MD Signature Date/Time: 08/22/2022/12:05:32 PM    Final    CT Angio Chest PE W and/or Wo  Contrast  Result Date: 08/21/2022 CLINICAL DATA:  Left lower extremity swelling, hypoxia. EXAM: CT ANGIOGRAPHY CHEST WITH CONTRAST TECHNIQUE: Multidetector CT imaging of the chest was performed using the standard protocol during bolus administration of intravenous contrast. Multiplanar CT image reconstructions and MIPs were obtained to evaluate the vascular anatomy.  RADIATION DOSE REDUCTION: This exam was performed according to the departmental dose-optimization program which includes automated exposure control, adjustment of the mA and/or kV according to patient size and/or use of iterative reconstruction technique. CONTRAST:  100mL OMNIPAQUE IOHEXOL 350 MG/ML SOLN COMPARISON:  None Available. FINDINGS: Cardiovascular: Satisfactory opacification of the pulmonary arteries to the segmental level. No evidence of pulmonary embolism. The heart is mildly enlarged. No pericardial effusion. Main pulmonary trunk is dilated measuring up to 3.8 cm concerning for pulmonary arterial hypertension. Mediastinum/Nodes: No enlarged mediastinal, hilar, or axillary lymph nodes. Thyroid gland, trachea, and esophagus demonstrate no significant findings. Lungs/Pleura: Lungs are clear. No pleural effusion or pneumothorax. Bibasilar dependent atelectasis. Upper Abdomen: No acute abnormality. Musculoskeletal: Idiopathic skeletal hyperostosis. No acute osseous abnormality. Review of the MIP images confirms the above findings. IMPRESSION: 1. No evidence of pulmonary embolism. 2. Main pulmonary trunk is dilated measuring up to 3.8 cm concerning for pulmonary arterial hypertension. 3. Lungs are clear without evidence of acute pulmonary process. Electronically Signed   By: Larose HiresImran  Ahmed D.O.   On: 08/21/2022 13:55   DG Chest 2 View  Result Date: 08/21/2022 CLINICAL DATA:  shortness of breath, fluid overload EXAM: PORTABLE CHEST 1 VIEW COMPARISON:  08/17/2022 FINDINGS: Cardiac silhouette is prominent. There is pulmonary interstitial  prominence with vascular congestion. No focal consolidation. No pneumothorax or pleural effusion identified. Aorta is calcified. IMPRESSION: Findings suggest CHF. Electronically Signed   By: Layla MawJoshua  Pleasure M.D.   On: 08/21/2022 10:44        Scheduled Meds:  enoxaparin (LOVENOX) injection  60 mg Subcutaneous Q24H   furosemide  40 mg Intravenous BID   insulin aspart  0-15 Units Subcutaneous TID WC   insulin aspart  0-5 Units Subcutaneous QHS   losartan  25 mg Oral Daily   Continuous Infusions:   LOS: 1 day     Silvano BilisNoah B Davinia Riccardi, MD [facility]   If 7PM-7AM, please contact night-coverage [email or web address] Password [email or web address] 08/22/2022, 12:58 PM

## 2022-08-23 ENCOUNTER — Inpatient Hospital Stay (HOSPITAL_COMMUNITY): Payer: Medicare HMO

## 2022-08-23 DIAGNOSIS — I447 Left bundle-branch block, unspecified: Secondary | ICD-10-CM

## 2022-08-23 DIAGNOSIS — I5033 Acute on chronic diastolic (congestive) heart failure: Secondary | ICD-10-CM | POA: Diagnosis not present

## 2022-08-23 DIAGNOSIS — R609 Edema, unspecified: Secondary | ICD-10-CM | POA: Diagnosis not present

## 2022-08-23 DIAGNOSIS — I5030 Unspecified diastolic (congestive) heart failure: Secondary | ICD-10-CM | POA: Diagnosis not present

## 2022-08-23 LAB — BASIC METABOLIC PANEL
Anion gap: 8 (ref 5–15)
BUN: 28 mg/dL — ABNORMAL HIGH (ref 8–23)
CO2: 34 mmol/L — ABNORMAL HIGH (ref 22–32)
Calcium: 9.3 mg/dL (ref 8.9–10.3)
Chloride: 102 mmol/L (ref 98–111)
Creatinine, Ser: 1.44 mg/dL — ABNORMAL HIGH (ref 0.61–1.24)
GFR, Estimated: 55 mL/min — ABNORMAL LOW (ref >60)
Glucose, Bld: 117 mg/dL — ABNORMAL HIGH (ref 70–99)
Potassium: 4 mmol/L (ref 3.5–5.1)
Sodium: 144 mmol/L (ref 135–145)

## 2022-08-23 LAB — GLUCOSE, CAPILLARY
Glucose-Capillary: 118 mg/dL — ABNORMAL HIGH (ref 70–99)
Glucose-Capillary: 80 mg/dL (ref 70–99)
Glucose-Capillary: 119 mg/dL — ABNORMAL HIGH (ref 70–99)
Glucose-Capillary: 139 mg/dL — ABNORMAL HIGH (ref 70–99)

## 2022-08-23 LAB — CBC
HCT: 51.7 % (ref 39.0–52.0)
Hemoglobin: 15.1 g/dL (ref 13.0–17.0)
MCH: 28.6 pg (ref 26.0–34.0)
MCHC: 29.2 g/dL — ABNORMAL LOW (ref 30.0–36.0)
MCV: 97.9 fL (ref 80.0–100.0)
Platelets: 130 10*3/uL — ABNORMAL LOW (ref 150–400)
RBC: 5.28 MIL/uL (ref 4.22–5.81)
RDW: 15.3 % (ref 11.5–15.5)
WBC: 4.1 10*3/uL (ref 4.0–10.5)
nRBC: 0 % (ref 0.0–0.2)

## 2022-08-23 LAB — HIV ANTIBODY (ROUTINE TESTING W REFLEX): HIV Screen 4th Generation wRfx: REACTIVE — AB

## 2022-08-23 MED ORDER — LOSARTAN POTASSIUM 50 MG PO TABS
100.0000 mg | ORAL_TABLET | Freq: Every day | ORAL | Status: DC
  Administered 2022-08-23 – 2022-08-26 (×4): 100 mg via ORAL
  Filled 2022-08-23 (×4): qty 2

## 2022-08-23 NOTE — Progress Notes (Signed)
Cardiologist:  Oneal  Subjective:  Breathing better   Objective:  Vitals:   08/22/22 2049 08/23/22 0355 08/23/22 0401 08/23/22 0800  BP: (!) 151/90 (!) 155/104    Pulse: 66 69    Resp: 20 20  (!) 22  Temp: 98.1 F (36.7 C) 97.8 F (36.6 C)    TempSrc: Oral Oral    SpO2: 90% 96%    Weight:   120.5 kg   Height:        Intake/Output from previous day:  Intake/Output Summary (Last 24 hours) at 08/23/2022 0810 Last data filed at 08/23/2022 0701 Gross per 24 hour  Intake 1080 ml  Output 900 ml  Net 180 ml    Physical Exam:  Obese black male Sitting in chair JVP not visible Decreased BS bases Distant heart sounds  Plus 2 edema  Lab Results: Basic Metabolic Panel: Recent Labs    08/22/22 0509 08/23/22 0417  NA 142 144  K 3.6 4.0  CL 101 102  CO2 34* 34*  GLUCOSE 100* 117*  BUN 19 28*  CREATININE 1.33* 1.44*  CALCIUM 8.9 9.3   Liver Function Tests: Recent Labs    08/21/22 1001 08/22/22 0509  AST 16 18  ALT 23 25  ALKPHOS 79 87  BILITOT 1.0 1.2  PROT 6.1* 6.7  ALBUMIN 3.2* 3.4*   No results for input(s): "LIPASE", "AMYLASE" in the last 72 hours. CBC: Recent Labs    08/21/22 1001 08/22/22 0509 08/23/22 0417  WBC 4.8 4.6 4.1  NEUTROABS 3.2 2.7  --   HGB 14.3 15.1 15.1  HCT 49.2 51.7 51.7  MCV 96.9 97.9 97.9  PLT 126* 129* 130*   Cardiac Enzymes: No results for input(s): "CKTOTAL", "CKMB", "CKMBINDEX", "TROPONINI" in the last 72 hours. BNP: Invalid input(s): "POCBNP" D-Dimer: No results for input(s): "DDIMER" in the last 72 hours. Hemoglobin A1C: Recent Labs    08/21/22 1755  HGBA1C 7.1*     Imaging: ECHOCARDIOGRAM COMPLETE  Result Date: 08/22/2022    ECHOCARDIOGRAM REPORT   [facility] Date of Exam: 08/22/2022 Medical Rec #:  [id number]         Height:       71.0 in Accession #:    [id number]        Weight:       263.0 lb Date of Birth:  05/13/1961          BSA:          2.369 m Patient Age:    61 years           BP:           167/96 mmHg Patient Gender: M                 HR:           78 bpm. Exam Location:  Inpatient Procedure: 2D Echo Indications:    CHF  History:        Patient has prior history of Echocardiogram examinations, most                 recent 05/29/2019. Risk Factors:Diabetes, Hypertension and                 Dyslipidemia.  Sonographer:    Cathie HoopsWendy Porter Referring Phys: [id number] Delila PereyraYRONE A KYLE IMPRESSIONS  1. Severe asymmetrical septal hypertrophy with otherwise moderate LVH of other wall segments. No dynamic LVOT gradient. . Left ventricular ejection fraction, by estimation, is  50 to 55%. The left ventricle has low normal function. The left ventricle has  no regional wall motion abnormalities. There is severe asymmetric left ventricular hypertrophy of the septal segment. Left ventricular diastolic parameters are consistent with Grade I diastolic dysfunction (impaired relaxation). Elevated left atrial pressure.  2. Right ventricular systolic function is normal. The right ventricular size is normal. Tricuspid regurgitation signal is inadequate for assessing PA pressure.  3. The mitral valve is abnormal. Mild mitral valve regurgitation. No evidence of mitral stenosis.  4. The aortic valve is tricuspid. There is mild calcification of the aortic valve. There is mild thickening of the aortic valve. Aortic valve regurgitation is not visualized. No aortic stenosis is present.  5. The inferior vena cava is normal in size with greater than 50% respiratory variability, suggesting right atrial pressure of 3 mmHg. FINDINGS  Left Ventricle: Severe asymmetrical septal hypertrophy with otherwise moderate LVH of other wall segments. No dynamic LVOT gradient. Left ventricular ejection fraction, by estimation, is 50 to 55%. The left ventricle has low normal function. The left ventricle has no regional wall motion abnormalities. The left ventricular internal cavity size was normal in size. There is severe asymmetric left  ventricular hypertrophy of the septal segment. Left ventricular diastolic parameters are consistent with Grade I diastolic dysfunction (impaired relaxation). Elevated left atrial pressure. Right Ventricle: The right ventricular size is normal. Right vetricular wall thickness was not well visualized. Right ventricular systolic function is normal. Tricuspid regurgitation signal is inadequate for assessing PA pressure. Left Atrium: Left atrial size was normal in size. Right Atrium: Right atrial size was normal in size. Pericardium: There is no evidence of pericardial effusion. Mitral Valve: The mitral valve is abnormal. There is mild thickening of the mitral valve leaflet(s). There is mild calcification of the mitral valve leaflet(s). Mild mitral annular calcification. Mild mitral valve regurgitation. No evidence of mitral valve stenosis. Tricuspid Valve: The tricuspid valve is normal in structure. Tricuspid valve regurgitation is trivial. No evidence of tricuspid stenosis. Aortic Valve: The aortic valve is tricuspid. There is mild calcification of the aortic valve. There is mild thickening of the aortic valve. There is mild aortic valve annular calcification. Aortic valve regurgitation is not visualized. No aortic stenosis  is present. Aortic valve mean gradient measures 11.0 mmHg. Aortic valve peak gradient measures 19.0 mmHg. Aortic valve area, by VTI measures 3.06 cm. Pulmonic Valve: The pulmonic valve was not well visualized. Pulmonic valve regurgitation is not visualized. No evidence of pulmonic stenosis. Aorta: The aortic root is normal in size and structure. Venous: The inferior vena cava is normal in size with greater than 50% respiratory variability, suggesting right atrial pressure of 3 mmHg. IAS/Shunts: No atrial level shunt detected by color flow Doppler.  LEFT VENTRICLE PLAX 2D LVIDd:         5.10 cm      Diastology LVIDs:         3.80 cm      LV e' medial:    4.13 cm/s LV PW:         1.30 cm      LV  E/e' medial:  17.8 LV IVS:        1.90 cm      LV e' lateral:   7.40 cm/s LVOT diam:     2.00 cm      LV E/e' lateral: 9.9 LV SV:         109 LV SV Index:   46 LVOT Area:  3.14 cm  LV Volumes (MOD) LV vol d, MOD A2C: 134.0 ml LV vol d, MOD A4C: 125.0 ml LV vol s, MOD A2C: 66.6 ml LV vol s, MOD A4C: 62.3 ml LV SV MOD A2C:     67.4 ml LV SV MOD A4C:     125.0 ml LV SV MOD BP:      65.6 ml RIGHT VENTRICLE RV Basal diam:  5.90 cm RV Mid diam:    4.45 cm RV S prime:     13.40 cm/s TAPSE (M-mode): 2.3 cm LEFT ATRIUM             Index        RIGHT ATRIUM           Index LA diam:        4.80 cm 2.03 cm/m   RA Area:     25.60 cm LA Vol (A2C):   54.0 ml 22.80 ml/m  RA Volume:   92.10 ml  38.88 ml/m LA Vol (A4C):   51.4 ml 21.70 ml/m LA Biplane Vol: 53.9 ml 22.75 ml/m  AORTIC VALVE                     PULMONIC VALVE AV Area (Vmax):    2.87 cm      PV Vmax:          1.89 m/s AV Area (Vmean):   2.57 cm      PV Peak grad:     14.3 mmHg AV Area (VTI):     3.06 cm      PR End Diast Vel: 13.54 msec AV Vmax:           218.00 cm/s AV Vmean:          159.000 cm/s AV VTI:            0.356 m AV Peak Grad:      19.0 mmHg AV Mean Grad:      11.0 mmHg LVOT Vmax:         199.00 cm/s LVOT Vmean:        130.000 cm/s LVOT VTI:          0.347 m LVOT/AV VTI ratio: 0.97  AORTA Ao Root diam: 3.30 cm Ao Asc diam:  3.30 cm MITRAL VALVE MV Area (PHT): 3.42 cm     SHUNTS MV Decel Time: 222 msec     Systemic VTI:  0.35 m MR Peak grad: 55.4 mmHg     Systemic Diam: 2.00 cm MR Vmax:      372.00 cm/s MV E velocity: 73.50 cm/s MV A velocity: 115.00 cm/s MV E/A ratio:  0.64 Dina Rich MD Electronically signed by Dina Rich MD Signature Date/Time: 08/22/2022/12:05:32 PM    Final    CT Angio Chest PE W and/or Wo Contrast  Result Date: 08/21/2022 CLINICAL DATA:  Left lower extremity swelling, hypoxia. EXAM: CT ANGIOGRAPHY CHEST WITH CONTRAST TECHNIQUE: Multidetector CT imaging of the chest was performed using the standard protocol  during bolus administration of intravenous contrast. Multiplanar CT image reconstructions and MIPs were obtained to evaluate the vascular anatomy. RADIATION DOSE REDUCTION: This exam was performed according to the departmental dose-optimization program which includes automated exposure control, adjustment of the mA and/or kV according to patient size and/or use of iterative reconstruction technique. CONTRAST:  OMNIPAQUE IOHEXOL 350 MG/ML SOLN COMPARISON:  None Available. FINDINGS: Cardiovascular: Satisfactory opacification of the pulmonary arteries to the segmental level. No evidence of pulmonary embolism. The  heart is mildly enlarged. No pericardial effusion. Main pulmonary trunk is dilated measuring up to 3.8 cm concerning for pulmonary arterial hypertension. Mediastinum/Nodes: No enlarged mediastinal, hilar, or axillary lymph nodes. Thyroid gland, trachea, and esophagus demonstrate no significant findings. Lungs/Pleura: Lungs are clear. No pleural effusion or pneumothorax. Bibasilar dependent atelectasis. Upper Abdomen: No acute abnormality. Musculoskeletal: Idiopathic skeletal hyperostosis. No acute osseous abnormality. Review of the MIP images confirms the above findings. IMPRESSION: 1. No evidence of pulmonary embolism. 2. Main pulmonary trunk is dilated measuring up to 3.8 cm concerning for pulmonary arterial hypertension. 3. Lungs are clear without evidence of acute pulmonary process. Electronically Signed   By: Larose Hires D.O.   On: 08/21/2022 13:55   DG Chest 2 View  Result Date: 08/21/2022 CLINICAL DATA:  shortness of breath, fluid overload EXAM: PORTABLE CHEST 1 VIEW COMPARISON:  08/17/2022 FINDINGS: Cardiac silhouette is prominent. There is pulmonary interstitial prominence with vascular congestion. No focal consolidation. No pneumothorax or pleural effusion identified. Aorta is calcified. IMPRESSION: Findings suggest CHF. Electronically Signed   By: Layla Maw M.D.   On: 08/21/2022  10:44    Cardiac Studies:  ECG: SR LBBB chronic    Telemetry:  NSR 08/23/2022   Echo: See above severe septal hypertrophy no SAM/LVOT gradient EF 50-55% grade one diastolic mild MR   Medications:    atorvastatin  20 mg Oral Daily   carvedilol  6.25 mg Oral BID WC   enoxaparin (LOVENOX) injection  60 mg Subcutaneous Q24H   furosemide  40 mg Intravenous BID   gabapentin  100 mg Oral QHS   insulin aspart  0-15 Units Subcutaneous TID WC   insulin aspart  0-5 Units Subcutaneous QHS   losartan  25 mg Oral Daily   pneumococcal 20-valent conjugate vaccine  0.5 mL Intramuscular Tomorrow-1000      Assessment/Plan:   61 y.o. admitted with volume overload in setting of poorly controlled HTN/DM, obesity and chronic respiratory failure with CO2 retention Chronic LBBB Has had severe LVH since TTE done 2019 and no documented CAD    HFpEF:  TTE with EF 50-55% grade one diastolic dysfunction on lasix bidi increase dose to 60 mg bid Avoid contraction alkalosis with CO2 retention. BNP minimally elevated Definitely component of chronic respiratory failure Bipap as needed   Respiratory panel negative no infiltrated on CXR Will need outpatient w/u for amyloid with severe LVH and likely cardiac MRI   DM:  per primary service Poorly controlled A1c 7.1 HTN:  increase Cozaar to 100 mg daily Cr 1.44 follow with ARB and diuresis   Elevated troponin :  no chest pan LBBB chronic no evidence of acute coronary syndrome 59-> 44     Charlton Haws 08/23/2022, 8:10 AM

## 2022-08-23 NOTE — TOC Progression Note (Signed)
Transition of Care [facility]) - Progression Note    Patient Details  Name: Mark [name] MRN: [id number] Date of Birth: Oct 26, 1960  [facility]) CM/SW Contact  Lennart Pall, LCSW Phone Number: 08/23/2022, 1:41 PM  Clinical Narrative:     Met with pt to review potential dc needs.  Pt confirms he has PCP and Cardiology MD coverage.  He does have O2 at home via [place], however, he only uses this when he is at home.  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 I do OK without it...".  He does not have any travel tanks because he does not want to use out of the home.  Explained that TOC will follow along and assist with any dc needs.     Barriers to Discharge: Continued Medical Work up  Expected Discharge Plan and Services                                               Social Determinants of Health (SDOH) Interventions SDOH Screenings   Food Insecurity: No Food Insecurity (08/21/2022)  Housing: Low Risk  (08/21/2022)  Transportation Needs: No Transportation Needs (08/21/2022)  Utilities: Not At Risk (08/21/2022)  Depression (PHQ2-9): Low Risk  (04/08/2022)  Financial Resource Strain: Low Risk  (04/08/2022)  Physical Activity: Inactive (04/08/2022)  Stress: No Stress Concern Present (04/08/2022)  Tobacco Use: Medium Risk (08/21/2022)    Readmission Risk Interventions    08/23/2022    1:41 PM  Readmission Risk Prevention Plan  Post Dischage Appt Complete  Medication Screening Complete  Transportation Screening Complete

## 2022-08-23 NOTE — Evaluation (Addendum)
Physical Therapy Evaluation-1x Patient Details Name: Mark [name] MRN: [id number] DOB: 02-24-1961 Today's Date: 08/23/2022  History of Present Illness  61 yo male admitted with CHF. Hx of CHF, DM, PAD, OSA, chronic pain, hearing loss, obesity, CKD  Clinical Impression  Pt agreeable to working with therapy. At rest, O2 95% on 3L. With ambulation, O2 90% on 3L, HR 85 bpm. Pt tolerated activity well. He denied any significant dyspnea during session. No acute PT needs. Will defer any further mobility needs to nursing and/or mobility team. 1x eval. Will sign off.        Recommendations for follow up therapy are one component of a multi-disciplinary discharge planning process, led by the attending physician.  Recommendations may be updated based on patient status, additional functional criteria and insurance authorization.  Follow Up Recommendations No PT follow up      Assistance Recommended at Discharge None  Patient can return home with the following       Equipment Recommendations None recommended by PT  Recommendations for Other Services       Functional Status Assessment Patient has had a recent decline in their functional status and demonstrates the ability to make significant improvements in function in a reasonable and predictable amount of time.     Precautions / Restrictions Precautions Precautions: None Precaution Comments: wears O2 PRN at baseline Restrictions Weight Bearing Restrictions: No      Mobility  Bed Mobility               General bed mobility comments: oob in recliner    Transfers Overall transfer level: Modified independent                      Ambulation/Gait Ambulation/Gait assistance: Modified independent (Device/Increase time) Gait Distance (Feet): 500 Feet Assistive device: None Gait Pattern/deviations: Step-through pattern       General Gait Details: Pt tolerated distance well. O2 90% on 3L, HR 85 bpm. Pt denied  dyspnea  Stairs            Wheelchair Mobility    Modified Rankin (Stroke Patients Only)       Balance Overall balance assessment: Modified Independent                                           Pertinent Vitals/Pain Pain Assessment Pain Assessment: No/denies pain    Home Living Family/patient expects to be discharged to:: Private residence Living Arrangements: Alone Available Help at Discharge: Family;Available PRN/intermittently Type of Home: Apartment Home Access: Level entry       Home Layout: One level Home Equipment: None      Prior Function Prior Level of Function : Independent/Modified Independent                     Hand Dominance        Extremity/Trunk Assessment   Upper Extremity Assessment Upper Extremity Assessment: Overall WFL for tasks assessed    Lower Extremity Assessment Lower Extremity Assessment: Overall WFL for tasks assessed    Cervical / Trunk Assessment Cervical / Trunk Assessment: Normal  Communication      Cognition Arousal/Alertness: Awake/alert Behavior During Therapy: WFL for tasks assessed/performed Overall Cognitive Status: Within Functional Limits for tasks assessed  General Comments      Exercises     Assessment/Plan    PT Assessment Patient does not need any further PT services  PT Problem List         PT Treatment Interventions      PT Goals (Current goals can be found in the Care Plan section)  Acute Rehab PT Goals Patient Stated Goal: home soon PT Goal Formulation: All assessment and education complete, DC therapy    Frequency       Co-evaluation               AM-PAC PT "6 Clicks" Mobility  Outcome Measure Help needed turning from your back to your side while in a flat bed without using bedrails?: None Help needed moving from lying on your back to sitting on the side of a flat bed without using  bedrails?: None Help needed moving to and from a bed to a chair (including a wheelchair)?: None Help needed standing up from a chair using your arms (e.g., wheelchair or bedside chair)?: None Help needed to walk in hospital room?: None Help needed climbing 3-5 steps with a railing? : None 6 Click Score: 24    End of Session Equipment Utilized During Treatment: Oxygen Activity Tolerance: Patient tolerated treatment well Patient left: in chair;with call bell/phone within reach        Time: 0927-0940 PT Time Calculation (min) (ACUTE ONLY): 13 min   Charges:   PT Evaluation $PT Eval Low Complexity: 1 Low            Faye Ramsay, PT Acute Rehabilitation  Office: [phone]

## 2022-08-23 NOTE — Progress Notes (Signed)
PROGRESS NOTE    RANSON HOLTHAUS  [id number] DOB: 1961/06/20 DOA: 08/21/2022 PCP: [name], FNP  Outpatient Specialists: cardiology    Brief Narrative:   From admission h and p  Mark [name] is a 61 y.o. male with medical history significant of DM2, HTN, HLD, HFpEF. Presenting with 2 weeks of DOE. He reports that he has been dyspneic with normal activities. He hasn't had any chest pain or palpitations. He reports some swelling in his legs, but he attributes that to gout. He has not had any fever or cough. He has not had any sick contacts. He saw his primary care team during this time and they recommended that he see a cardiologist. He went to the cardiology appointment this morning. During their evaluation, they became concerned for acute HF exacerbation. So they recommended he go to the ED for evaluation. He denies any other aggravating or alleviating factors.     Assessment & Plan:   Principal Problem:   Acute on chronic diastolic (congestive) heart failure (HCC) Active Problems:   Essential hypertension   Hyperlipidemia   Chronic diastolic heart failure (HCC)   DM2 (diabetes mellitus, type 2) (HCC)   Obesity (BMI 30-39.9)   PAD (peripheral artery disease) (HCC)   OSA (obstructive sleep apnea)  # HFrEF # Asymmetric left ventricular septal hypertrophy -Few weeks worsening DOE. BNP elevated. O2 in 70s on arrival. -CTA no signs PE. TTE with severe asymmetric levt ventricle septal hypertrophy and moderate LVH.  -No chest pain and only mild stable tropinemia, think likely demand -on  coreg/cozaar, IV lasix,  cards recommend  transition to torsemide on 12/25 -  cardiology recs  outpatient cardiac MRI regarding asymmetric hypertrophy  - cardiology to decide on SGLT2 inhibitor   Bilateral lower extremity pitting edema Right > left ( reports recent gout flare on right , currently no pain) Check venous doppler  # Acute on chronic hypoxic respiratory failure O2 70s on  presentation. Is on intermittent O2 at home. Currently breathing comfortably on 3 L. CTA with relatively clear lungs, no PE. - [place] O2, wean as able  CKDII Cr close to baseline   # HTN Bp stable on coreg/cozaar, IV lasix,   # T2DM, noninsulin dependent  Mild glucose elevation - hold home metformin, resume at discharge - SSI  # Chronic pain - home gabapentin  # OSA Reports has home o2 but does not have cpap, f/u with pcp regarding outpatient sleep study Not on cpap but it's been ordered here  # Thrombocytopenia Mild, 120s - check hiv, hcv, smear   DVT prophylaxis: lovenox Code Status: full Family Communication: sister over the phone by patient's request    Remains inpatient appropriate because: need for IV diuresis Dispo: likely home on 12/25 if cleared by cardiology     Consultants:  cardiology  Procedures: none  Antimicrobials:  none    Subjective: Breathing improved, no chest pain,  Legs are swollen   Objective: Vitals:   08/23/22 0355 08/23/22 0401 08/23/22 0800 08/23/22 1330  BP: (!) 155/104   (!) 148/91  Pulse: 69   70  Resp: 20  (!) 22 18  Temp: 97.8 F (36.6 C)   98.3 F (36.8 C)  TempSrc: Oral   Oral  SpO2: 96%   96%  Weight:  120.5 kg    Height:        Intake/Output Summary (Last 24 hours) at 08/23/2022 1451 Last data filed at 08/23/2022 1000 Gross per 24 hour  Intake  480 ml  Output 1450 ml  Net -970 ml   Filed Weights   08/21/22 0934 08/21/22 1737 08/23/22 0401  Weight: 120 kg 119.3 kg 120.5 kg    Examination:  General exam: Appears calm and comfortable , hard of hearing ,wearing hearing aids  Respiratory system: Clear to auscultation. Respiratory effort normal. Cardiovascular system: S1 & S2 heard, RRR. No JVD, murmurs, rubs, gallops or clicks.   Gastrointestinal system: Abdomen is nondistended, soft and nontender. No organomegaly or masses felt. Normal bowel sounds heard. Central nervous system: Alert and oriented. No focal  neurological deficits. Extremities: Symmetric 5 x 5 power. Bilateral lower extremity pitting  edema to knees, right > left  Skin: No rashes, lesions or ulcers Psychiatry: Judgement and insight appear normal. Mood & affect appropriate.     Data Reviewed: I have personally reviewed following labs and imaging studies  CBC: Recent Labs  Lab 08/21/22 1001 08/22/22 0509 08/23/22 0417  WBC 4.8 4.6 4.1  NEUTROABS 3.2 2.7  --   HGB 14.3 15.1 15.1  HCT 49.2 51.7 51.7  MCV 96.9 97.9 97.9  PLT 126* 129* [id number]*   Basic Metabolic Panel: Recent Labs  Lab 08/21/22 1001 08/22/22 0509 08/23/22 0417  NA 145 142 144  K 3.8 3.6 4.0  CL 103 101 102  CO2 35* 34* 34*  GLUCOSE 108* 100* 117*  BUN 20 19 28*  CREATININE 1.32* 1.33* 1.44*  CALCIUM 8.8* 8.9 9.3   GFR: Estimated Creatinine Clearance: 69.4 mL/min (A) (by C-G formula based on SCr of 1.44 mg/dL (H)). Liver Function Tests: Recent Labs  Lab 08/21/22 1001 08/22/22 0509  AST 16 18  ALT 23 25  ALKPHOS 79 87  BILITOT 1.0 1.2  PROT 6.1* 6.7  ALBUMIN 3.2* 3.4*   No results for input(s): "LIPASE", "AMYLASE" in the last 168 hours. No results for input(s): "AMMONIA" in the last 168 hours. Coagulation Profile: No results for input(s): "INR", "PROTIME" in the last 168 hours. Cardiac Enzymes: No results for input(s): "CKTOTAL", "CKMB", "CKMBINDEX", "TROPONINI" in the last 168 hours. BNP (last 3 results) No results for input(s): "PROBNP" in the last 8760 hours. HbA1C: Recent Labs    08/21/22 1755  HGBA1C 7.1*   CBG: Recent Labs  Lab 08/22/22 1237 08/22/22 1740 08/22/22 2052 08/23/22 0735 08/23/22 1225  GLUCAP 141* 163* 155* 118* 80   Lipid Profile: No results for input(s): "CHOL", "HDL", "LDLCALC", "TRIG", "CHOLHDL", "LDLDIRECT" in the last 72 hours. Thyroid Function Tests: No results for input(s): "TSH", "T4TOTAL", "FREET4", "T3FREE", "THYROIDAB" in the last 72 hours. Anemia Panel: No results for input(s):  "VITAMINB12", "FOLATE", "FERRITIN", "TIBC", "IRON", "RETICCTPCT" in the last 72 hours. Urine analysis:    Component Value Date/Time   COLORURINE YELLOW 01/07/2008 1805   APPEARANCEUR CLOUDY (A) 01/07/2008 1805   LABSPEC 1.019 01/07/2008 1805   PHURINE 5.0 01/07/2008 1805   GLUCOSEU NEGATIVE 01/07/2008 1805   HGBUR SMALL (A) 01/07/2008 1805   BILIRUBINUR negative 04/08/2022 1122   KETONESUR NEGATIVE 01/07/2008 1805   PROTEINUR Negative 04/08/2022 1122   PROTEINUR 30 (A) 01/07/2008 1805   UROBILINOGEN 0.2 04/08/2022 1122   UROBILINOGEN 0.2 01/07/2008 1805   NITRITE negative 04/08/2022 1122   NITRITE NEGATIVE 01/07/2008 1805   LEUKOCYTESUR Negative 04/08/2022 1122   Sepsis Labs: @LABRCNTIP (procalcitonin:4,lacticidven:4)  ) Recent Results (from the past 240 hour(s))  Resp panel by RT-PCR (RSV, Flu A&B, Covid) Anterior Nasal Swab     Status: None   Collection Time: 08/21/22 10:33 AM   Specimen:  Anterior Nasal Swab  Result Value Ref Range Status   SARS Coronavirus 2 by RT PCR NEGATIVE NEGATIVE Final    Comment: (NOTE) SARS-CoV-2 target nucleic acids are NOT DETECTED.  The SARS-CoV-2 RNA is generally detectable in upper respiratory specimens during the acute phase of infection. The lowest concentration of SARS-CoV-2 viral copies this assay can detect is 138 copies/mL. A negative result does not preclude SARS-Cov-2 infection and should not be used as the sole basis for treatment or other patient management decisions. A negative result may occur with  improper specimen collection/handling, submission of specimen other than nasopharyngeal swab, presence of viral mutation(s) within the areas targeted by this assay, and inadequate number of viral copies(<138 copies/mL). A negative result must be combined with clinical observations, patient history, and epidemiological information. The expected result is Negative.  Fact Sheet for Patients:   BloggerCourse.com  Fact Sheet for Healthcare Providers:  SeriousBroker.it  This test is no t yet approved or cleared by the [place] FDA and  has been authorized for detection and/or diagnosis of SARS-CoV-2 by FDA under an Emergency Use Authorization (EUA). This EUA will remain  in effect (meaning this test can be used) for the duration of the COVID-19 declaration under Section 564(b)(1) of the Act, 21 U.S.C.section 360bbb-3(b)(1), unless the authorization is terminated  or revoked sooner.       Influenza A by PCR NEGATIVE NEGATIVE Final   Influenza B by PCR NEGATIVE NEGATIVE Final    Comment: (NOTE) The Xpert Xpress SARS-CoV-2/FLU/RSV plus assay is intended as an aid in the diagnosis of influenza from Nasopharyngeal swab specimens and should not be used as a sole basis for treatment. Nasal washings and aspirates are unacceptable for Xpert Xpress SARS-CoV-2/FLU/RSV testing.  Fact Sheet for Patients: BloggerCourse.com  Fact Sheet for Healthcare Providers: SeriousBroker.it  This test is not yet approved or cleared by the [place] FDA and has been authorized for detection and/or diagnosis of SARS-CoV-2 by FDA under an Emergency Use Authorization (EUA). This EUA will remain in effect (meaning this test can be used) for the duration of the COVID-19 declaration under Section 564(b)(1) of the Act, 21 U.S.C. section 360bbb-3(b)(1), unless the authorization is terminated or revoked.     Resp Syncytial Virus by PCR NEGATIVE NEGATIVE Final    Comment: (NOTE) Fact Sheet for Patients: BloggerCourse.com  Fact Sheet for Healthcare Providers: SeriousBroker.it  This test is not yet approved or cleared by the [place] FDA and has been authorized for detection and/or diagnosis of SARS-CoV-2 by FDA under an Emergency Use  Authorization (EUA). This EUA will remain in effect (meaning this test can be used) for the duration of the COVID-19 declaration under Section 564(b)(1) of the Act, 21 U.S.C. section 360bbb-3(b)(1), unless the authorization is terminated or revoked.  Performed at [facility], [address], [place], [place] [address]          Radiology Studies: ECHOCARDIOGRAM COMPLETE  Result Date: 08/22/2022    ECHOCARDIOGRAM REPORT   Patient Name:   SONY HINCH [facility] Date of Exam: 08/22/2022 Medical Rec #:  [id number]         Height:       71.0 in Accession #:    [id number]        Weight:       263.0 lb Date of Birth:  12-03-1960          BSA:          2.369 m Patient Age:  61 years          BP:           167/96 mmHg Patient Gender: M                 HR:           78 bpm. Exam Location:  Inpatient Procedure: 2D Echo Indications:    CHF  History:        Patient has prior history of Echocardiogram examinations, most                 recent 05/29/2019. Risk Factors:Diabetes, Hypertension and                 Dyslipidemia.  Sonographer:    Harvie Junior Referring Phys: [id number] [place]  1. Severe asymmetrical septal hypertrophy with otherwise moderate LVH of other wall segments. No dynamic LVOT gradient. . Left ventricular ejection fraction, by estimation, is 50 to 55%. The left ventricle has low normal function. The left ventricle has  no regional wall motion abnormalities. There is severe asymmetric left ventricular hypertrophy of the septal segment. Left ventricular diastolic parameters are consistent with Grade I diastolic dysfunction (impaired relaxation). Elevated left atrial pressure.  2. Right ventricular systolic function is normal. The right ventricular size is normal. Tricuspid regurgitation signal is inadequate for assessing PA pressure.  3. The mitral valve is abnormal. Mild mitral valve regurgitation. No evidence of mitral stenosis.  4. The aortic valve is tricuspid.  There is mild calcification of the aortic valve. There is mild thickening of the aortic valve. Aortic valve regurgitation is not visualized. No aortic stenosis is present.  5. The inferior vena cava is normal in size with greater than 50% respiratory variability, suggesting right atrial pressure of 3 mmHg. FINDINGS  Left Ventricle: Severe asymmetrical septal hypertrophy with otherwise moderate LVH of other wall segments. No dynamic LVOT gradient. Left ventricular ejection fraction, by estimation, is 50 to 55%. The left ventricle has low normal function. The left ventricle has no regional wall motion abnormalities. The left ventricular internal cavity size was normal in size. There is severe asymmetric left ventricular hypertrophy of the septal segment. Left ventricular diastolic parameters are consistent with Grade I diastolic dysfunction (impaired relaxation). Elevated left atrial pressure. Right Ventricle: The right ventricular size is normal. Right vetricular wall thickness was not well visualized. Right ventricular systolic function is normal. Tricuspid regurgitation signal is inadequate for assessing PA pressure. Left Atrium: Left atrial size was normal in size. Right Atrium: Right atrial size was normal in size. Pericardium: There is no evidence of pericardial effusion. Mitral Valve: The mitral valve is abnormal. There is mild thickening of the mitral valve leaflet(s). There is mild calcification of the mitral valve leaflet(s). Mild mitral annular calcification. Mild mitral valve regurgitation. No evidence of mitral valve stenosis. Tricuspid Valve: The tricuspid valve is normal in structure. Tricuspid valve regurgitation is trivial. No evidence of tricuspid stenosis. Aortic Valve: The aortic valve is tricuspid. There is mild calcification of the aortic valve. There is mild thickening of the aortic valve. There is mild aortic valve annular calcification. Aortic valve regurgitation is not visualized. No aortic  stenosis  is present. Aortic valve mean gradient measures 11.0 mmHg. Aortic valve peak gradient measures 19.0 mmHg. Aortic valve area, by VTI measures 3.06 cm. Pulmonic Valve: The pulmonic valve was not well visualized. Pulmonic valve regurgitation is not visualized. No evidence of pulmonic stenosis. Aorta: The aortic root is normal in  size and structure. Venous: The inferior vena cava is normal in size with greater than 50% respiratory variability, suggesting right atrial pressure of 3 mmHg. IAS/Shunts: No atrial level shunt detected by color flow Doppler.  LEFT VENTRICLE PLAX 2D LVIDd:         5.10 cm      Diastology LVIDs:         3.80 cm      LV e' medial:    4.13 cm/s LV PW:         1.30 cm      LV E/e' medial:  17.8 LV IVS:        1.90 cm      LV e' lateral:   7.40 cm/s LVOT diam:     2.00 cm      LV E/e' lateral: 9.9 LV SV:         109 LV SV Index:   46 LVOT Area:     3.14 cm  LV Volumes (MOD) LV vol d, MOD A2C: 134.0 ml LV vol d, MOD A4C: 125.0 ml LV vol s, MOD A2C: 66.6 ml LV vol s, MOD A4C: 62.3 ml LV SV MOD A2C:     67.4 ml LV SV MOD A4C:     125.0 ml LV SV MOD BP:      65.6 ml RIGHT VENTRICLE RV Basal diam:  5.90 cm RV Mid diam:    4.45 cm RV S prime:     13.40 cm/s TAPSE (M-mode): 2.3 cm LEFT ATRIUM             Index        RIGHT ATRIUM           Index LA diam:        4.80 cm 2.03 cm/m   RA Area:     25.60 cm LA Vol (A2C):   54.0 ml 22.80 ml/m  RA Volume:   92.10 ml  38.88 ml/m LA Vol (A4C):   51.4 ml 21.70 ml/m LA Biplane Vol: 53.9 ml 22.75 ml/m  AORTIC VALVE                     PULMONIC VALVE AV Area (Vmax):    2.87 cm      PV Vmax:          1.89 m/s AV Area (Vmean):   2.57 cm      PV Peak grad:     14.3 mmHg AV Area (VTI):     3.06 cm      PR End Diast Vel: 13.54 msec AV Vmax:           218.00 cm/s AV Vmean:          159.000 cm/s AV VTI:            0.356 m AV Peak Grad:      19.0 mmHg AV Mean Grad:      11.0 mmHg LVOT Vmax:         199.00 cm/s LVOT Vmean:        130.000 cm/s LVOT VTI:           0.347 m LVOT/AV VTI ratio: 0.97  AORTA Ao Root diam: 3.30 cm Ao Asc diam:  3.30 cm MITRAL VALVE MV Area (PHT): 3.42 cm     SHUNTS MV Decel Time: 222 msec     Systemic VTI:  0.35 m MR Peak grad: 55.4 mmHg     Systemic Diam: 2.00 cm  MR Vmax:      372.00 cm/s MV E velocity: 73.50 cm/s MV A velocity: 115.00 cm/s MV E/A ratio:  0.64 Carlyle Dolly MD Electronically signed by Carlyle Dolly MD Signature Date/Time: 08/22/2022/12:05:32 PM    Final         Scheduled Meds:  atorvastatin  20 mg Oral Daily   carvedilol  6.25 mg Oral BID WC   enoxaparin (LOVENOX) injection  60 mg Subcutaneous Q24H   furosemide  40 mg Intravenous BID   gabapentin  100 mg Oral QHS   insulin aspart  0-15 Units Subcutaneous TID WC   insulin aspart  0-5 Units Subcutaneous QHS   losartan  100 mg Oral Daily   pneumococcal 20-valent conjugate vaccine  0.5 mL Intramuscular Tomorrow-1000   Continuous Infusions:   LOS: 2 days     Florencia Reasons, MD PhD FACP [facility]   If 7PM-7AM, please contact night-coverage [email or web address] Password [facility] 08/23/2022, 2:51 PM

## 2022-08-23 NOTE — Progress Notes (Signed)
Pt said he had his cpap on all day and would like to keep it off tonight. Advised to let the RN know if he changes his mind.

## 2022-08-23 NOTE — Progress Notes (Signed)
BLE venous duplex has been completed.   Results can be found under chart review under CV PROC. 08/23/2022 4:38 PM Annick Dimaio RVT, RDMS

## 2022-08-24 ENCOUNTER — Encounter (HOSPITAL_COMMUNITY): Payer: Self-pay | Admitting: Internal Medicine

## 2022-08-24 DIAGNOSIS — I5030 Unspecified diastolic (congestive) heart failure: Secondary | ICD-10-CM | POA: Diagnosis not present

## 2022-08-24 DIAGNOSIS — I447 Left bundle-branch block, unspecified: Secondary | ICD-10-CM | POA: Diagnosis not present

## 2022-08-24 DIAGNOSIS — I5033 Acute on chronic diastolic (congestive) heart failure: Secondary | ICD-10-CM | POA: Diagnosis not present

## 2022-08-24 LAB — CBC
HCT: 51.2 % (ref 39.0–52.0)
Hemoglobin: 14.7 g/dL (ref 13.0–17.0)
MCH: 28.1 pg (ref 26.0–34.0)
MCHC: 28.7 g/dL — ABNORMAL LOW (ref 30.0–36.0)
MCV: 97.7 fL (ref 80.0–100.0)
Platelets: 126 10*3/uL — ABNORMAL LOW (ref 150–400)
RBC: 5.24 MIL/uL (ref 4.22–5.81)
RDW: 15.2 % (ref 11.5–15.5)
WBC: 3.6 10*3/uL — ABNORMAL LOW (ref 4.0–10.5)
nRBC: 0 % (ref 0.0–0.2)

## 2022-08-24 LAB — BASIC METABOLIC PANEL
Anion gap: 7 (ref 5–15)
BUN: 29 mg/dL — ABNORMAL HIGH (ref 8–23)
CO2: 36 mmol/L — ABNORMAL HIGH (ref 22–32)
Calcium: 8.8 mg/dL — ABNORMAL LOW (ref 8.9–10.3)
Chloride: 100 mmol/L (ref 98–111)
Creatinine, Ser: 1.44 mg/dL — ABNORMAL HIGH (ref 0.61–1.24)
GFR, Estimated: 55 mL/min — ABNORMAL LOW (ref >60)
Glucose, Bld: 110 mg/dL — ABNORMAL HIGH (ref 70–99)
Potassium: 4 mmol/L (ref 3.5–5.1)
Sodium: 143 mmol/L (ref 135–145)

## 2022-08-24 LAB — GLUCOSE, CAPILLARY
Glucose-Capillary: 119 mg/dL — ABNORMAL HIGH (ref 70–99)
Glucose-Capillary: 93 mg/dL (ref 70–99)
Glucose-Capillary: 98 mg/dL (ref 70–99)
Glucose-Capillary: 135 mg/dL — ABNORMAL HIGH (ref 70–99)

## 2022-08-24 MED ORDER — EMPAGLIFLOZIN 10 MG PO TABS
10.0000 mg | ORAL_TABLET | Freq: Every day | ORAL | Status: DC
  Administered 2022-08-24 – 2022-08-26 (×3): 10 mg via ORAL
  Filled 2022-08-24 (×3): qty 1

## 2022-08-24 NOTE — Progress Notes (Signed)
Patient able to maintain 92% while ambulating on room air at 1830.  Saturation while at rest on room air: 98%.  Will continue to monitor.

## 2022-08-24 NOTE — Progress Notes (Signed)
Rounding Note    Patient Name: Mark [name] Date of Encounter: 08/24/2022  [facility] Cardiologist: Armanda Magic, MD    Subjective    60 yo admitted with volume overload, severe dyspnea  Hx of obesity, OSA, HTN, DM O2 sats were 72  Echo  08/22/22:   EF 50-55%.  Severe septal LVH.  No LVOT gradient .   Grade I DD  Mild MR , mild calcification of the AV.  Mean AV gradient of 11 mmHg .   Add Jardiance 10 mg a day  Feeling better today  Still on oxygen.  Will need to ambulate on room air to see if he can maintain his sats    Inpatient Medications    Scheduled Meds:  atorvastatin  20 mg Oral Daily   carvedilol  6.25 mg Oral BID WC   enoxaparin (LOVENOX) injection  60 mg Subcutaneous Q24H   furosemide  40 mg Intravenous BID   gabapentin  100 mg Oral QHS   insulin aspart  0-15 Units Subcutaneous TID WC   insulin aspart  0-5 Units Subcutaneous QHS   losartan  100 mg Oral Daily   Continuous Infusions:  PRN Meds: acetaminophen **OR** acetaminophen   Vital Signs    Vitals:   08/23/22 0800 08/23/22 1330 08/23/22 1945 08/24/22 0405  BP:  (!) 148/91 (!) 129/92 (!) 141/96  Pulse:  70 65 74  Resp: (!) 22 18 18 16   Temp:  98.3 F (36.8 C) 97.9 F (36.6 C) (!) 97.4 F (36.3 C)  TempSrc:  Oral Oral Oral  SpO2:  96% 93% 93%  Weight:      Height:        Intake/Output Summary (Last 24 hours) at 08/24/2022 0857 Last data filed at 08/24/2022 0844 Gross per 24 hour  Intake 1040 ml  Output 1630 ml  Net -590 ml      08/23/2022    4:01 AM 08/21/2022    5:37 PM 08/21/2022    9:34 AM  Last 3 Weights  Weight (lbs) 265 lb 8.7 oz 263 lb 264 lb 8.8 oz  Weight (kg) 120.45 kg 119.296 kg 120 kg      Telemetry    NSR  - Personally Reviewed  ECG     - Personally Reviewed  Physical Exam   GEN: middle age man, No acute distress.   Neck: No JVD Cardiac: RRR, 1-2/6 systolic murmur  Respiratory: Clear to auscultation bilaterally. GI: Soft, nontender,  non-distended  MS: trace edema  No deformity. Neuro:  Nonfocal  Psych: Normal affect   Labs    High Sensitivity Troponin:   Recent Labs  Lab 08/21/22 1001 08/21/22 1755  TROPONINIHS 59* 44*     Chemistry Recent Labs  Lab 08/21/22 1001 08/22/22 0509 08/23/22 0417 08/24/22 0435  NA 145 142 144 143  K 3.8 3.6 4.0 4.0  CL 103 101 102 100  CO2 35* 34* 34* 36*  GLUCOSE 108* 100* 117* 110*  BUN 20 19 28* 29*  CREATININE 1.32* 1.33* 1.44* 1.44*  CALCIUM 8.8* 8.9 9.3 8.8*  PROT 6.1* 6.7  --   --   ALBUMIN 3.2* 3.4*  --   --   AST 16 18  --   --   ALT 23 25  --   --   ALKPHOS 79 87  --   --   BILITOT 1.0 1.2  --   --   GFRNONAA >60 >60 55* 55*  ANIONGAP 7 7 8  7    Lipids No results for input(s): "CHOL", "TRIG", "HDL", "LABVLDL", "LDLCALC", "CHOLHDL" in the last 168 hours.  Hematology Recent Labs  Lab 08/22/22 0509 08/23/22 0417 08/24/22 0435  WBC 4.6 4.1 3.6*  RBC 5.28 5.28 5.24  HGB 15.1 15.1 14.7  HCT 51.7 51.7 51.2  MCV 97.9 97.9 97.7  MCH 28.6 28.6 28.1  MCHC 29.2* 29.2* 28.7*  RDW 15.2 15.3 15.2  PLT 129* 130* 126*   Thyroid No results for input(s): "TSH", "FREET4" in the last 168 hours.  BNP Recent Labs  Lab 08/21/22 1001  BNP 241.2*    DDimer No results for input(s): "DDIMER" in the last 168 hours.   Radiology    VAS US LOWER EXTREMITY VENOUS (DVT)  Result Date: 08/23/2022  Lower Venous DVT Study Patient Name:  Mark [name]  Date of Exam:   08/23/2022 Medical Rec #: [id number]          Accession #:    [id number] Date of Birth: 05/04/1961           Patient Gender: M Patient Age:   [age] years [facility] Procedure:      VAS US LOWER EXTREMITY VENOUS (DVT) Referring Phys: Mark PoissonFANG [name] --------------------------------------------------------------------------------  Indications: Edema.  Comparison Study: No previous exams Performing Technologist: Jody Hill RVT, RDMS  Examination Guidelines: A complete evaluation includes B-mode  imaging, spectral Doppler, color Doppler, and power Doppler as needed of all accessible portions of each vessel. Bilateral testing is considered an integral part of a complete examination. Limited examinations for reoccurring indications may be performed as noted. The reflux portion of the exam is performed with the patient in reverse Trendelenburg.  +---------+---------------+---------+-----------+----------+--------------+ RIGHT    CompressibilityPhasicitySpontaneityPropertiesThrombus Aging +---------+---------------+---------+-----------+----------+--------------+ CFV      Full           Yes      Yes                                 +---------+---------------+---------+-----------+----------+--------------+ SFJ      Full                                                        +---------+---------------+---------+-----------+----------+--------------+ FV Prox  Full           Yes      Yes                                 +---------+---------------+---------+-----------+----------+--------------+ FV Mid   Full           Yes      Yes                                 +---------+---------------+---------+-----------+----------+--------------+ FV DistalFull           Yes      Yes                                 +---------+---------------+---------+-----------+----------+--------------+ PFV      Full                                                        +---------+---------------+---------+-----------+----------+--------------+  POP      Full           Yes      Yes                                 +---------+---------------+---------+-----------+----------+--------------+ PTV      Full                                                        +---------+---------------+---------+-----------+----------+--------------+ PERO     Full                                                        +---------+---------------+---------+-----------+----------+--------------+    +---------+---------------+---------+-----------+----------+--------------+ LEFT     CompressibilityPhasicitySpontaneityPropertiesThrombus Aging +---------+---------------+---------+-----------+----------+--------------+ CFV      Full           Yes      Yes                                 +---------+---------------+---------+-----------+----------+--------------+ SFJ      Full                                                        +---------+---------------+---------+-----------+----------+--------------+ FV Prox  Full           Yes      Yes                                 +---------+---------------+---------+-----------+----------+--------------+ FV Mid   Full           Yes      Yes                                 +---------+---------------+---------+-----------+----------+--------------+ FV DistalFull           Yes      Yes                                 +---------+---------------+---------+-----------+----------+--------------+ PFV      Full                                                        +---------+---------------+---------+-----------+----------+--------------+ POP      Full           Yes      Yes                                 +---------+---------------+---------+-----------+----------+--------------+ PTV  Full                                                        +---------+---------------+---------+-----------+----------+--------------+ PERO     Full                                                        +---------+---------------+---------+-----------+----------+--------------+     Summary: BILATERAL: - No evidence of deep vein thrombosis seen in the lower extremities, bilaterally. -No evidence of popliteal cyst, bilaterally. -Subcutaneous edema seen in areas of calf and ankle, bilaterally.  *See table(s) above for measurements and observations. Electronically signed by Waverly Ferrari MD on 08/23/2022 at 6:21:40 PM.     Final    ECHOCARDIOGRAM COMPLETE  Result Date: 08/22/2022    ECHOCARDIOGRAM REPORT   Patient Name:   JEHU MCCAUSLIN [facility] Date of Exam: 08/22/2022 Medical Rec #:  [id number]         Height:       71.0 in Accession #:    [id number]        Weight:       263.0 lb Date of Birth:  1961/03/10          BSA:          2.369 m Patient Age:    61 years          BP:           167/96 mmHg Patient Gender: M                 HR:           78 bpm. Exam Location:  Inpatient Procedure: 2D Echo Indications:    CHF  History:        Patient has prior history of Echocardiogram examinations, most                 recent 05/29/2019. Risk Factors:Diabetes, Hypertension and                 Dyslipidemia.  Sonographer:    Cathie Hoops Referring Phys: [id number] Delila Pereyra A KYLE IMPRESSIONS  1. Severe asymmetrical septal hypertrophy with otherwise moderate LVH of other wall segments. No dynamic LVOT gradient. . Left ventricular ejection fraction, by estimation, is 50 to 55%. The left ventricle has low normal function. The left ventricle has  no regional wall motion abnormalities. There is severe asymmetric left ventricular hypertrophy of the septal segment. Left ventricular diastolic parameters are consistent with Grade I diastolic dysfunction (impaired relaxation). Elevated left atrial pressure.  2. Right ventricular systolic function is normal. The right ventricular size is normal. Tricuspid regurgitation signal is inadequate for assessing PA pressure.  3. The mitral valve is abnormal. Mild mitral valve regurgitation. No evidence of mitral stenosis.  4. The aortic valve is tricuspid. There is mild calcification of the aortic valve. There is mild thickening of the aortic valve. Aortic valve regurgitation is not visualized. No aortic stenosis is present.  5. The inferior vena cava is normal in size with greater than 50% respiratory variability, suggesting right atrial pressure of 3 mmHg. FINDINGS  Left Ventricle: Severe asymmetrical septal hypertrophy  with otherwise moderate  LVH of other wall segments. No dynamic LVOT gradient. Left ventricular ejection fraction, by estimation, is 50 to 55%. The left ventricle has low normal function. The left ventricle has no regional wall motion abnormalities. The left ventricular internal cavity size was normal in size. There is severe asymmetric left ventricular hypertrophy of the septal segment. Left ventricular diastolic parameters are consistent with Grade I diastolic dysfunction (impaired relaxation). Elevated left atrial pressure. Right Ventricle: The right ventricular size is normal. Right vetricular wall thickness was not well visualized. Right ventricular systolic function is normal. Tricuspid regurgitation signal is inadequate for assessing PA pressure. Left Atrium: Left atrial size was normal in size. Right Atrium: Right atrial size was normal in size. Pericardium: There is no evidence of pericardial effusion. Mitral Valve: The mitral valve is abnormal. There is mild thickening of the mitral valve leaflet(s). There is mild calcification of the mitral valve leaflet(s). Mild mitral annular calcification. Mild mitral valve regurgitation. No evidence of mitral valve stenosis. Tricuspid Valve: The tricuspid valve is normal in structure. Tricuspid valve regurgitation is trivial. No evidence of tricuspid stenosis. Aortic Valve: The aortic valve is tricuspid. There is mild calcification of the aortic valve. There is mild thickening of the aortic valve. There is mild aortic valve annular calcification. Aortic valve regurgitation is not visualized. No aortic stenosis  is present. Aortic valve mean gradient measures 11.0 mmHg. Aortic valve peak gradient measures 19.0 mmHg. Aortic valve area, by VTI measures 3.06 cm. Pulmonic Valve: The pulmonic valve was not well visualized. Pulmonic valve regurgitation is not visualized. No evidence of pulmonic stenosis. Aorta: The aortic root is normal in size and structure. Venous: The  inferior vena cava is normal in size with greater than 50% respiratory variability, suggesting right atrial pressure of 3 mmHg. IAS/Shunts: No atrial level shunt detected by color flow Doppler.  LEFT VENTRICLE PLAX 2D LVIDd:         5.10 cm      Diastology LVIDs:         3.80 cm      LV e' medial:    4.13 cm/s LV PW:         1.30 cm      LV E/e' medial:  17.8 LV IVS:        1.90 cm      LV e' lateral:   7.40 cm/s LVOT diam:     2.00 cm      LV E/e' lateral: 9.9 LV SV:         109 LV SV Index:   46 LVOT Area:     3.14 cm  LV Volumes (MOD) LV vol d, MOD A2C: 134.0 ml LV vol d, MOD A4C: 125.0 ml LV vol s, MOD A2C: 66.6 ml LV vol s, MOD A4C: 62.3 ml LV SV MOD A2C:     67.4 ml LV SV MOD A4C:     125.0 ml LV SV MOD BP:      65.6 ml RIGHT VENTRICLE RV Basal diam:  5.90 cm RV Mid diam:    4.45 cm RV S prime:     13.40 cm/s TAPSE (M-mode): 2.3 cm LEFT ATRIUM             Index        RIGHT ATRIUM           Index LA diam:        4.80 cm 2.03 cm/m   RA Area:     25.60 cm LA Vol (  A2C):   54.0 ml 22.80 ml/m  RA Volume:   92.10 ml  38.88 ml/m LA Vol (A4C):   51.4 ml 21.70 ml/m LA Biplane Vol: 53.9 ml 22.75 ml/m  AORTIC VALVE                     PULMONIC VALVE AV Area (Vmax):    2.87 cm      PV Vmax:          1.89 m/s AV Area (Vmean):   2.57 cm      PV Peak grad:     14.3 mmHg AV Area (VTI):     3.06 cm      PR End Diast Vel: 13.54 msec AV Vmax:           218.00 cm/s AV Vmean:          159.000 cm/s AV VTI:            0.356 m AV Peak Grad:      19.0 mmHg AV Mean Grad:      11.0 mmHg LVOT Vmax:         199.00 cm/s LVOT Vmean:        130.000 cm/s LVOT VTI:          0.347 m LVOT/AV VTI ratio: 0.97  AORTA Ao Root diam: 3.30 cm Ao Asc diam:  3.30 cm MITRAL VALVE MV Area (PHT): 3.42 cm     SHUNTS MV Decel Time: 222 msec     Systemic VTI:  0.35 m MR Peak grad: 55.4 mmHg     Systemic Diam: 2.00 cm MR Vmax:      372.00 cm/s MV E velocity: 73.50 cm/s MV A velocity: 115.00 cm/s MV E/A ratio:  0.64 Dina Rich MD Electronically  signed by Dina Rich MD Signature Date/Time: 08/22/2022/12:05:32 PM    Final     Cardiac Studies      Patient Profile     61 y.o. male    Assessment & Plan     1.  HFrEF :   improving  I/O are -290 for this admission  Cont IV lasix for now.  I anticipate that he could probably go home today or tomorrow  Jardiance 10 mg added   Check O2 sats on RA.   If he is back to baseline, could change the IV lasix to his home dose of Torsemide .  2. OSA :  does not have CPAP .   Follow up with his primary MD   3.  Morbid obesity :    4. DM:   Jardiance added for CHF as well as DM.   Further management per Dr. Roda Shutters.      For questions or updates, please contact [facility] Please consult [email or web address] for contact info under        Signed, Kristeen Miss, MD  08/24/2022, 8:57 AM

## 2022-08-24 NOTE — Progress Notes (Signed)
PROGRESS NOTE    Mark [name]  ZOX:[id number] DOB: [birth year] DOA: 08/21/2022 PCP: [name], FNP  Outpatient Specialists: cardiology    Brief Narrative:   From admission h and p  Mark [name] is a 61 y.o. male with medical history significant of DM2, HTN, HLD, HFpEF. Presenting with 2 weeks of DOE. He reports that he has been dyspneic with normal activities. He hasn't had any chest pain or palpitations. He reports some swelling in his legs, but he attributes that to gout. He has not had any fever or cough. He has not had any sick contacts. He saw his primary care team during this time and they recommended that he see a cardiologist. He went to the cardiology appointment this morning. During their evaluation, they became concerned for acute HF exacerbation. So they recommended he go to the ED for evaluation. He denies any other aggravating or alleviating factors.     Assessment & Plan:   Principal Problem:   Acute on chronic diastolic (congestive) heart failure (HCC) Active Problems:   Essential hypertension   Hyperlipidemia   Chronic diastolic heart failure (HCC)   DM2 (diabetes mellitus, type 2) (HCC)   Obesity (BMI 30-39.9)   PAD (peripheral artery disease) (HCC)   OSA (obstructive sleep apnea)  # HFrEF # Asymmetric left ventricular septal hypertrophy -Few weeks worsening DOE. BNP elevated. O2 in 70s on arrival. -CTA no signs PE. TTE with severe asymmetric levt ventricle septal hypertrophy and moderate LVH.  -No chest pain and only mild stable tropinemia, think likely demand -  cardiology recs  outpatient cardiac MRI regarding asymmetric hypertrophy  - cardiology started on  on SGLT2 inhibitor ,on  coreg/cozaar, IV lasix, -remain edematous, 02 dropped to 89% on room air while ambulating   Bilateral lower extremity pitting edema Right > left ( reports recent gout flare on right , currently no pain) venous doppler negative for DVT  # Acute on chronic hypoxic  respiratory failure O2 70s on presentation. Is on intermittent O2 at home. Currently breathing comfortably on 3 L. CTA with relatively clear lungs, no PE. - [place] O2, wean as able  CKDII Cr close to baseline   # HTN Bp stable on coreg/cozaar, IV lasix,   # T2DM, noninsulin dependent  Mild glucose elevation - hold home metformin, resume at discharge - SSI  # Chronic pain - home gabapentin  # OSA Reports has home o2 but does not have cpap, f/u with pcp regarding outpatient sleep study Not on cpap but it's been ordered here  # Thrombocytopenia Mild, 120s - check hiv, hcv, smear  + HIV screening test, need ID follow up   DVT prophylaxis: lovenox Code Status: full Family Communication: sister over the phone by patient's request on 12/24   Remains inpatient appropriate because: need for IV diuresis Dispo: likely home on 12/26 if cleared by cardiology     Consultants:  cardiology  Procedures: none  Antimicrobials:  none    Subjective: Breathing improved, no chest pain, Legs are swollen  O2 dropped to 89% when walking, continue on iv lasix, place ted hose, elevated legs  Objective: Vitals:   08/23/22 0800 08/23/22 1330 08/23/22 1945 08/24/22 0405  BP:  (!) 148/91 (!) 129/92 (!) 141/96  Pulse:  70 65 74  Resp: (!) Temp:  98.3 F (36.8 C) 97.9 F (36.6 C) (!) 97.4 F (36.3 C)  TempSrc:  Oral Oral Oral  SpO2:  96% 93% 93%  Weight:  Height:        Intake/Output Summary (Last 24 hours) at 08/24/2022 1221 Last data filed at 08/24/2022 0900 Gross per 24 hour  Intake 1040 ml  Output 1080 ml  Net -40 ml   Filed Weights   08/21/22 0934 08/21/22 1737 08/23/22 0401  Weight: 120 kg 119.3 kg 120.5 kg    Examination:  General exam: Appears calm and comfortable , hard of hearing ,wearing hearing aids  Respiratory system: Clear to auscultation. Respiratory effort normal. Cardiovascular system: S1 & S2 heard, RRR. No JVD, murmurs, rubs,  gallops or clicks.   Gastrointestinal system: Abdomen is nondistended, soft and nontender. No organomegaly or masses felt. Normal bowel sounds heard. Central nervous system: Alert and oriented. No focal neurological deficits. Extremities: Symmetric 5 x 5 power. Bilateral lower extremity pitting  edema to knees, right > left  Skin: No rashes, lesions or ulcers Psychiatry: Judgement and insight appear normal. Mood & affect appropriate.     Data Reviewed: I have personally reviewed following labs and imaging studies  CBC: Recent Labs  Lab 08/21/22 1001 08/22/22 0509 08/23/22 0417 08/24/22 0435  WBC 4.8 4.6 4.1 3.6*  NEUTROABS 3.2 2.7  --   --   HGB 14.3 15.1 15.1 14.7  HCT 49.2 51.7 51.7 51.2  MCV 96.9 97.9 97.9 97.7  PLT 126* 129* 130* 126*   Basic Metabolic Panel: Recent Labs  Lab 08/21/22 1001 08/22/22 0509 08/23/22 0417 08/24/22 0435  NA 145 142 144 143  K 3.8 3.6 4.0 4.0  CL 103 101 102 100  CO2 35* 34* 34* 36*  GLUCOSE 108* 100* 117* 110*  BUN 20 19 28* 29*  CREATININE 1.32* 1.33* 1.44* 1.44*  CALCIUM 8.8* 8.9 9.3 8.8*   GFR: Estimated Creatinine Clearance: 69.4 mL/min (A) (by C-G formula based on SCr of 1.44 mg/dL (H)). Liver Function Tests: Recent Labs  Lab 08/21/22 1001 08/22/22 0509  AST 16 18  ALT 23 25  ALKPHOS 79 87  BILITOT 1.0 1.2  PROT 6.1* 6.7  ALBUMIN 3.2* 3.4*   No results for input(s): "LIPASE", "AMYLASE" in the last 168 hours. No results for input(s): "AMMONIA" in the last 168 hours. Coagulation Profile: No results for input(s): "INR", "PROTIME" in the last 168 hours. Cardiac Enzymes: No results for input(s): "CKTOTAL", "CKMB", "CKMBINDEX", "TROPONINI" in the last 168 hours. BNP (last 3 results) No results for input(s): "PROBNP" in the last 8760 hours. HbA1C: Recent Labs    08/21/22 1755  HGBA1C 7.1*   CBG: Recent Labs  Lab 08/23/22 1225 08/23/22 1717 08/23/22 1948 08/24/22 0744 08/24/22 1139  GLUCAP 80 119* 139* 119* 93    Lipid Profile: No results for input(s): "CHOL", "HDL", "LDLCALC", "TRIG", "CHOLHDL", "LDLDIRECT" in the last 72 hours. Thyroid Function Tests: No results for input(s): "TSH", "T4TOTAL", "FREET4", "T3FREE", "THYROIDAB" in the last 72 hours. Anemia Panel: No results for input(s): "VITAMINB12", "FOLATE", "FERRITIN", "TIBC", "IRON", "RETICCTPCT" in the last 72 hours. Urine analysis:    Component Value Date/Time   COLORURINE YELLOW 01/07/2008 1805   APPEARANCEUR CLOUDY (A) 01/07/2008 1805   LABSPEC 1.019 01/07/2008 1805   PHURINE 5.0 01/07/2008 1805   GLUCOSEU NEGATIVE 01/07/2008 1805   HGBUR SMALL (A) 01/07/2008 1805   BILIRUBINUR negative 04/08/2022 1122   KETONESUR NEGATIVE 01/07/2008 1805   PROTEINUR Negative 04/08/2022 1122   PROTEINUR 30 (A) 01/07/2008 1805   UROBILINOGEN 0.2 04/08/2022 1122   UROBILINOGEN 0.2 01/07/2008 1805   NITRITE negative 04/08/2022 1122   NITRITE NEGATIVE 01/07/2008 1805  LEUKOCYTESUR Negative 04/08/2022 1122   Sepsis Labs: @LABRCNTIP (procalcitonin:4,lacticidven:4)  ) Recent Results (from the past 240 hour(s))  Resp panel by RT-PCR (RSV, Flu A&B, Covid) Anterior Nasal Swab     Status: None   Collection Time: 08/21/22 10:33 AM   Specimen: Anterior Nasal Swab  Result Value Ref Range Status   SARS Coronavirus 2 by RT PCR NEGATIVE NEGATIVE Final    Comment: (NOTE) SARS-CoV-2 target nucleic acids are NOT DETECTED.  The SARS-CoV-2 RNA is generally detectable in upper respiratory specimens during the acute phase of infection. The lowest concentration of SARS-CoV-2 viral copies this assay can detect is 138 copies/mL. A negative result does not preclude SARS-Cov-2 infection and should not be used as the sole basis for treatment or other patient management decisions. A negative result may occur with  improper specimen collection/handling, submission of specimen other than nasopharyngeal swab, presence of viral mutation(s) within the areas targeted by  this assay, and inadequate number of viral copies(<138 copies/mL). A negative result must be combined with clinical observations, patient history, and epidemiological information. The expected result is Negative.  Fact Sheet for Patients:  08/23/22  Fact Sheet for Healthcare Providers:  BloggerCourse.com  This test is no t yet approved or cleared by the [employer] FDA and  has been authorized for detection and/or diagnosis of SARS-CoV-2 by FDA under an Emergency Use Authorization (EUA). This EUA will remain  in effect (meaning this test can be used) for the duration of the COVID-19 declaration under Section 564(b)(1) of the Act, 21 U.S.C.section 360bbb-3(b)(1), unless the authorization is terminated  or revoked sooner.       Influenza A by PCR NEGATIVE NEGATIVE Final   Influenza B by PCR NEGATIVE NEGATIVE Final    Comment: (NOTE) The Xpert Xpress SARS-CoV-2/FLU/RSV plus assay is intended as an aid in the diagnosis of influenza from Nasopharyngeal swab specimens and should not be used as a sole basis for treatment. Nasal washings and aspirates are unacceptable for Xpert Xpress SARS-CoV-2/FLU/RSV testing.  Fact Sheet for Patients: [place]  Fact Sheet for Healthcare Providers: BloggerCourse.com  This test is not yet approved or cleared by the [employer] FDA and has been authorized for detection and/or diagnosis of SARS-CoV-2 by FDA under an Emergency Use Authorization (EUA). This EUA will remain in effect (meaning this test can be used) for the duration of the COVID-19 declaration under Section 564(b)(1) of the Act, 21 U.S.C. section 360bbb-3(b)(1), unless the authorization is terminated or revoked.     Resp Syncytial Virus by PCR NEGATIVE NEGATIVE Final    Comment: (NOTE) Fact Sheet for Patients: [place]  Fact Sheet  for Healthcare Providers: BloggerCourse.com  This test is not yet approved or cleared by the [employer] FDA and has been authorized for detection and/or diagnosis of SARS-CoV-2 by FDA under an Emergency Use Authorization (EUA). This EUA will remain in effect (meaning this test can be used) for the duration of the COVID-19 declaration under Section 564(b)(1) of the Act, 21 U.S.C. section 360bbb-3(b)(1), unless the authorization is terminated or revoked.  Performed at [facility], [address], [place], [place]          Radiology Studies: VAS [id number] LOWER EXTREMITY VENOUS (DVT)  Result Date: 08/23/2022  Lower Venous DVT Study Patient Name:  ROSHAUN POUND [facility]  Date of Exam:   08/23/2022 Medical Rec #: 08/25/2022          Accession #:    [id number] Date of Birth: 25-Dec-1960  Patient Gender: M Patient Age:   3 years Exam Location:  [facility] Procedure:      VAS [place] LOWER EXTREMITY VENOUS (DVT) Referring Phys: Parke Poisson Murrell Elizondo --------------------------------------------------------------------------------  Indications: Edema.  Comparison Study: No previous exams Performing Technologist: Jody Hill RVT, RDMS  Examination Guidelines: A complete evaluation includes B-mode imaging, spectral Doppler, color Doppler, and power Doppler as needed of all accessible portions of each vessel. Bilateral testing is considered an integral part of a complete examination. Limited examinations for reoccurring indications may be performed as noted. The reflux portion of the exam is performed with the patient in reverse Trendelenburg.  +---------+---------------+---------+-----------+----------+--------------+ RIGHT    CompressibilityPhasicitySpontaneityPropertiesThrombus Aging +---------+---------------+---------+-----------+----------+--------------+ CFV      Full           Yes      Yes                                  +---------+---------------+---------+-----------+----------+--------------+ SFJ      Full                                                        +---------+---------------+---------+-----------+----------+--------------+ FV Prox  Full           Yes      Yes                                 +---------+---------------+---------+-----------+----------+--------------+ FV Mid   Full           Yes      Yes                                 +---------+---------------+---------+-----------+----------+--------------+ FV DistalFull           Yes      Yes                                 +---------+---------------+---------+-----------+----------+--------------+ PFV      Full                                                        +---------+---------------+---------+-----------+----------+--------------+ POP      Full           Yes      Yes                                 +---------+---------------+---------+-----------+----------+--------------+ PTV      Full                                                        +---------+---------------+---------+-----------+----------+--------------+ PERO     Full                                                        +---------+---------------+---------+-----------+----------+--------------+   +---------+---------------+---------+-----------+----------+--------------+  LEFT     CompressibilityPhasicitySpontaneityPropertiesThrombus Aging +---------+---------------+---------+-----------+----------+--------------+ CFV      Full           Yes      Yes                                 +---------+---------------+---------+-----------+----------+--------------+ SFJ      Full                                                        +---------+---------------+---------+-----------+----------+--------------+ FV Prox  Full           Yes      Yes                                  +---------+---------------+---------+-----------+----------+--------------+ FV Mid   Full           Yes      Yes                                 +---------+---------------+---------+-----------+----------+--------------+ FV DistalFull           Yes      Yes                                 +---------+---------------+---------+-----------+----------+--------------+ PFV      Full                                                        +---------+---------------+---------+-----------+----------+--------------+ POP      Full           Yes      Yes                                 +---------+---------------+---------+-----------+----------+--------------+ PTV      Full                                                        +---------+---------------+---------+-----------+----------+--------------+ PERO     Full                                                        +---------+---------------+---------+-----------+----------+--------------+     Summary: BILATERAL: - No evidence of deep vein thrombosis seen in the lower extremities, bilaterally. -No evidence of popliteal cyst, bilaterally. -Subcutaneous edema seen in areas of calf and ankle, bilaterally.  *See table(s) above for measurements and observations. Electronically signed by Waverly Ferrari MD on 08/23/2022 at 6:21:40 PM.    Final  Scheduled Meds:  atorvastatin  20 mg Oral Daily   carvedilol  6.25 mg Oral BID WC   empagliflozin  10 mg Oral Daily   enoxaparin (LOVENOX) injection  60 mg Subcutaneous Q24H   furosemide  40 mg Intravenous BID   gabapentin  100 mg Oral QHS   insulin aspart  0-15 Units Subcutaneous TID WC   insulin aspart  0-5 Units Subcutaneous QHS   losartan  100 mg Oral Daily   Continuous Infusions:   LOS: 3 days     Albertine GratesFang Cierria Height, MD PhD FACP [facility]   If 7PM-7AM, please contact night-coverage [email or web address] Password [email or web address] 08/24/2022, 12:21 PM

## 2022-08-24 NOTE — Progress Notes (Signed)
SATURATION QUALIFICATIONS: (This note is used to comply with regulatory documentation for home oxygen)  Patient Saturations on Room Air at Rest = 95%  Patient Saturations on Room Air while Ambulating = 89%  Patient Saturations on 2 Liters of oxygen while Ambulating = 96%  Please briefly explain why patient needs home oxygen:  Pt oxygen level drops below 92% while ambulating.

## 2022-08-25 DIAGNOSIS — I5030 Unspecified diastolic (congestive) heart failure: Secondary | ICD-10-CM | POA: Diagnosis not present

## 2022-08-25 DIAGNOSIS — D696 Thrombocytopenia, unspecified: Secondary | ICD-10-CM | POA: Diagnosis not present

## 2022-08-25 DIAGNOSIS — I5033 Acute on chronic diastolic (congestive) heart failure: Secondary | ICD-10-CM | POA: Diagnosis not present

## 2022-08-25 DIAGNOSIS — I447 Left bundle-branch block, unspecified: Secondary | ICD-10-CM | POA: Diagnosis not present

## 2022-08-25 LAB — CBC
HCT: 47.7 % (ref 39.0–52.0)
Hemoglobin: 13.9 g/dL (ref 13.0–17.0)
MCH: 28.2 pg (ref 26.0–34.0)
MCHC: 29.1 g/dL — ABNORMAL LOW (ref 30.0–36.0)
MCV: 96.8 fL (ref 80.0–100.0)
Platelets: 125 10*3/uL — ABNORMAL LOW (ref 150–400)
RBC: 4.93 MIL/uL (ref 4.22–5.81)
RDW: 15 % (ref 11.5–15.5)
WBC: 4.5 10*3/uL (ref 4.0–10.5)
nRBC: 0 % (ref 0.0–0.2)

## 2022-08-25 LAB — BASIC METABOLIC PANEL
Anion gap: 8 (ref 5–15)
BUN: 30 mg/dL — ABNORMAL HIGH (ref 8–23)
CO2: 33 mmol/L — ABNORMAL HIGH (ref 22–32)
Calcium: 8.8 mg/dL — ABNORMAL LOW (ref 8.9–10.3)
Chloride: 101 mmol/L (ref 98–111)
Creatinine, Ser: 1.34 mg/dL — ABNORMAL HIGH (ref 0.61–1.24)
GFR, Estimated: 60 mL/min — ABNORMAL LOW (ref >60)
Glucose, Bld: 112 mg/dL — ABNORMAL HIGH (ref 70–99)
Potassium: 3.8 mmol/L (ref 3.5–5.1)
Sodium: 142 mmol/L (ref 135–145)

## 2022-08-25 LAB — HIV-1/2 AB - DIFFERENTIATION
HIV 1 Ab: REACTIVE
HIV 1 Ab: REACTIVE
HIV 2 Ab: NONREACTIVE
HIV 2 Ab: NONREACTIVE

## 2022-08-25 LAB — GLUCOSE, CAPILLARY
Glucose-Capillary: 123 mg/dL — ABNORMAL HIGH (ref 70–99)
Glucose-Capillary: 98 mg/dL (ref 70–99)
Glucose-Capillary: 131 mg/dL — ABNORMAL HIGH (ref 70–99)
Glucose-Capillary: 176 mg/dL — ABNORMAL HIGH (ref 70–99)

## 2022-08-25 LAB — HCV AB W REFLEX TO QUANT PCR: HCV Ab: NONREACTIVE

## 2022-08-25 LAB — HCV INTERPRETATION

## 2022-08-25 LAB — HEPATITIS B SURFACE ANTIGEN: Hepatitis B Surface Ag: NONREACTIVE

## 2022-08-25 NOTE — Progress Notes (Addendum)
PROGRESS NOTE    Mark [name]  WUJ:[id number] DOB: 08/01/1961 DOA: 08/21/2022 PCP: Arnette FeltsMoore, Janece, FNP  Outpatient Specialists: cardiology    Brief Narrative:   From admission h and p  Mark [name] is a 61 y.o. male with medical history significant of DM2, HTN, HLD, HFpEF. Presenting with 2 weeks of DOE. He reports that he has been dyspneic with normal activities. He hasn't had any chest pain or palpitations. He reports some swelling in his legs, but he attributes that to gout. He has not had any fever or cough. He has not had any sick contacts. He saw his primary care team during this time and they recommended that he see a cardiologist. He went to the cardiology appointment this morning. During their evaluation, they became concerned for acute HF exacerbation. So they recommended he go to the ED for evaluation. He denies any other aggravating or alleviating factors.     Assessment & Plan:   Principal Problem:   Acute on chronic diastolic (congestive) heart failure (HCC) Active Problems:   Essential hypertension   Hyperlipidemia   Chronic diastolic heart failure (HCC)   DM2 (diabetes mellitus, type 2) (HCC)   Obesity (BMI 30-39.9)   PAD (peripheral artery disease) (HCC)   OSA (obstructive sleep apnea)  # acute on chronic HFrEF/acute hypoxic respiratory failure  # Asymmetric left ventricular septal hypertrophy -Few weeks worsening DOE. BNP elevated. O2 in 70s on arrival. -CTA no signs PE.  Lvef 50-55%, TTE with severe asymmetric levt ventricle septal hypertrophy and moderate LVH.  -No chest pain and only mild stable tropinemia, think likely demand -  cardiology recs  outpatient cardiac MRI regarding asymmetric hypertrophy  - cardiology started on  on SGLT2 inhibitor ,on  coreg/cozaar, IV lasix, -remain edematous, os stas has improved, he requested to stay one more day in the hospital to receive iv lasix  12/26 Addendum: he walked with mobility specialist , per note "Had  x3 standing rest breaks due to SPO2 dropping to 82% to practice pursed lip breathing. After ~ 1 min he was able to recover his SPO2 91%. "   Bilateral lower extremity pitting edema Right > left ( reports recent gout flare on right , currently no pain) venous doppler negative for DVT Ted hose, elevated leg   CKDII Cr close to baseline   # HTN Bp stable on coreg/cozaar, IV lasix,   # T2DM, noninsulin dependent  Mild glucose elevation - hold home metformin, resume at discharge - SSI -SGLT2 inhibitor started this hospitalization  # Chronic pain - home gabapentin  # OSA Reports has home o2 but does not have cpap, f/u with pcp regarding outpatient sleep study Not on cpap but it's been ordered here  # Thrombocytopenia Mild, 120s -negative hepc - + HIV screening test, confirmatory test in process, need ID follow up, secure chat message sent to ID. Test result discussed with patient on 12/26, he understands to follow up with ID to discuss with test result.   DVT prophylaxis: lovenox Code Status: full Family Communication: sister over the phone by patient's request on 12/24   Remains inpatient appropriate because: need for IV diuresis Dispo: likely home on 12/27 if cleared by cardiology     Consultants:  cardiology  Procedures: none  Antimicrobials:  none    Subjective:  He is anxious, he does not want to go home today, he worries he could not keep his oxygen up when he gets home, he requested to stay one more  day, Breathing improved, no chest pain, Legs are swollen  continue on iv lasix, place ted hose, elevated legs  He is advised to avoid salt, fluids restriction  Addendum: he walked with mobility specialist , per note "Had x3 standing rest breaks due to SPO2 dropping to 82% to practice pursed lip breathing. After ~ 1 min he was able to recover his SPO2 91%. "  Objective: Vitals:   08/24/22 2117 08/24/22 2142 08/25/22 0500 08/25/22 0527  BP: (!) 149/90   (!)  139/90  Pulse: 61   (!) 56  Resp: 18 19  18   Temp: 98.1 F (36.7 C)   98.1 F (36.7 C)  TempSrc: Oral   Oral  SpO2: 98%   98%  Weight:   119.4 kg   Height:        Intake/Output Summary (Last 24 hours) at 08/25/2022 1143 Last data filed at 08/25/2022 0900 Gross per 24 hour  Intake 1258 ml  Output 1075 ml  Net 183 ml   Filed Weights   08/21/22 1737 08/23/22 0401 08/25/22 0500  Weight: 119.3 kg 120.5 kg 119.4 kg    Examination:  General exam: anxious, hard of hearing ,wearing hearing aids  Respiratory system: Clear to auscultation. Respiratory effort normal. Cardiovascular system: S1 & S2 heard, RRR. No JVD, murmurs, rubs, gallops or clicks.   Gastrointestinal system: Abdomen is nondistended, soft and nontender. No organomegaly or masses felt. Normal bowel sounds heard. Central nervous system: Alert and oriented. No focal neurological deficits. Extremities: Symmetric 5 x 5 power. Bilateral lower extremity pitting  edema to knees, right > left  Skin: No rashes, lesions or ulcers Psychiatry: anxious      Data Reviewed: I have personally reviewed following labs and imaging studies  CBC: Recent Labs  Lab 08/21/22 1001 08/22/22 0509 08/23/22 0417 08/24/22 0435 08/25/22 0341  WBC 4.8 4.6 4.1 3.6* 4.5  NEUTROABS 3.2 2.7  --   --   --   HGB 14.3 15.1 15.1 14.7 13.9  HCT 49.2 51.7 51.7 51.2 47.7  MCV 96.9 97.9 97.9 97.7 96.8  PLT 126* 129* 130* 126* 125*   Basic Metabolic Panel: Recent Labs  Lab 08/21/22 1001 08/22/22 0509 08/23/22 0417 08/24/22 0435 08/25/22 0341  NA 145 142 144 143 142  K 3.8 3.6 4.0 4.0 3.8  CL 103 101 102 100 101  CO2 35* 34* 34* 36* 33*  GLUCOSE 108* 100* 117* 110* 112*  BUN 20 19 28* 29* 30*  CREATININE 1.32* 1.33* 1.44* 1.44* 1.34*  CALCIUM 8.8* 8.9 9.3 8.8* 8.8*   GFR: Estimated Creatinine Clearance: 74.1 mL/min (A) (by C-G formula based on SCr of 1.34 mg/dL (H)). Liver Function Tests: Recent Labs  Lab 08/21/22 1001  08/22/22 0509  AST 16 18  ALT 23 25  ALKPHOS 79 87  BILITOT 1.0 1.2  PROT 6.1* 6.7  ALBUMIN 3.2* 3.4*   No results for input(s): "LIPASE", "AMYLASE" in the last 168 hours. No results for input(s): "AMMONIA" in the last 168 hours. Coagulation Profile: No results for input(s): "INR", "PROTIME" in the last 168 hours. Cardiac Enzymes: No results for input(s): "CKTOTAL", "CKMB", "CKMBINDEX", "TROPONINI" in the last 168 hours. BNP (last 3 results) No results for input(s): "PROBNP" in the last 8760 hours. HbA1C: No results for input(s): "HGBA1C" in the last 72 hours.  CBG: Recent Labs  Lab 08/24/22 0744 08/24/22 1139 08/24/22 1608 08/24/22 2116 08/25/22 0759  GLUCAP 119* 93 98 135* 123*   Lipid Profile: No results for  input(s): "CHOL", "HDL", "LDLCALC", "TRIG", "CHOLHDL", "LDLDIRECT" in the last 72 hours. Thyroid Function Tests: No results for input(s): "TSH", "T4TOTAL", "FREET4", "T3FREE", "THYROIDAB" in the last 72 hours. Anemia Panel: No results for input(s): "VITAMINB12", "FOLATE", "FERRITIN", "TIBC", "IRON", "RETICCTPCT" in the last 72 hours. Urine analysis:    Component Value Date/Time   COLORURINE YELLOW 01/07/2008 1805   APPEARANCEUR CLOUDY (A) 01/07/2008 1805   LABSPEC 1.019 01/07/2008 1805   PHURINE 5.0 01/07/2008 1805   GLUCOSEU NEGATIVE 01/07/2008 1805   HGBUR SMALL (A) 01/07/2008 1805   BILIRUBINUR negative 04/08/2022 1122   KETONESUR NEGATIVE 01/07/2008 1805   PROTEINUR Negative 04/08/2022 1122   PROTEINUR 30 (A) 01/07/2008 1805   UROBILINOGEN 0.2 04/08/2022 1122   UROBILINOGEN 0.2 01/07/2008 1805   NITRITE negative 04/08/2022 1122   NITRITE NEGATIVE 01/07/2008 1805   LEUKOCYTESUR Negative 04/08/2022 1122   Sepsis Labs: (procalcitonin:4,lacticidven:4)  ) Recent Results (from the past 240 hour(s))  Resp panel by RT-PCR (RSV, Flu A&B, Covid) Anterior Nasal Swab     Status: None   Collection Time: 08/21/22 10:33 AM   Specimen: Anterior Nasal  Swab  Result Value Ref Range Status   SARS Coronavirus 2 by RT PCR NEGATIVE NEGATIVE Final    Comment: (NOTE) SARS-CoV-2 target nucleic acids are NOT DETECTED.  The SARS-CoV-2 RNA is generally detectable in upper respiratory specimens during the acute phase of infection. The lowest concentration of SARS-CoV-2 viral copies this assay can detect is 138 copies/mL. A negative result does not preclude SARS-Cov-2 infection and should not be used as the sole basis for treatment or other patient management decisions. A negative result may occur with  improper specimen collection/handling, submission of specimen other than nasopharyngeal swab, presence of viral mutation(s) within the areas targeted by this assay, and inadequate number of viral copies(<138 copies/mL). A negative result must be combined with clinical observations, patient history, and epidemiological information. The expected result is Negative.  Fact Sheet for Patients:  BloggerCourse.com  Fact Sheet for Healthcare Providers:  SeriousBroker.it  This test is no t yet approved or cleared by the [place] FDA and  has been authorized for detection and/or diagnosis of SARS-CoV-2 by FDA under an Emergency Use Authorization (EUA). This EUA will remain  in effect (meaning this test can be used) for the duration of the COVID-19 declaration under Section 564(b)(1) of the Act, 21 U.S.C.section 360bbb-3(b)(1), unless the authorization is terminated  or revoked sooner.       Influenza A by PCR NEGATIVE NEGATIVE Final   Influenza B by PCR NEGATIVE NEGATIVE Final    Comment: (NOTE) The Xpert Xpress SARS-CoV-2/FLU/RSV plus assay is intended as an aid in the diagnosis of influenza from Nasopharyngeal swab specimens and should not be used as a sole basis for treatment. Nasal washings and aspirates are unacceptable for Xpert Xpress SARS-CoV-2/FLU/RSV testing.  Fact Sheet for  Patients: BloggerCourse.com  Fact Sheet for Healthcare Providers: SeriousBroker.it  This test is not yet approved or cleared by the [place] FDA and has been authorized for detection and/or diagnosis of SARS-CoV-2 by FDA under an Emergency Use Authorization (EUA). This EUA will remain in effect (meaning this test can be used) for the duration of the COVID-19 declaration under Section 564(b)(1) of the Act, 21 U.S.C. section 360bbb-3(b)(1), unless the authorization is terminated or revoked.     Resp Syncytial Virus by PCR NEGATIVE NEGATIVE Final    Comment: (NOTE) Fact Sheet for Patients: BloggerCourse.com  Fact Sheet for Healthcare Providers: SeriousBroker.it  This  test is not yet approved or cleared by the [place] and has been authorized for detection and/or diagnosis of SARS-CoV-2 by FDA under an Emergency Use Authorization (EUA). This EUA will remain in effect (meaning this test can be used) for the duration of the COVID-19 declaration under Section 564(b)(1) of the Act, 21 U.S.C. section 360bbb-3(b)(1), unless the authorization is terminated or revoked.  Performed at [facility], [address] [place], [place] [address]          Radiology Studies: VAS [place] LOWER EXTREMITY VENOUS (DVT)  Result Date: 08/23/2022  Lower Venous DVT Study Patient Name:  Mark [name] [facility]  Date of Exam:   08/23/2022 Medical Rec #: [id number]          Accession #:    [id number] Date of Birth: Jan 16, 1961           Patient Gender: M Patient Age:   88 years Exam Location:  [facility] Procedure:      VAS [place] LOWER EXTREMITY VENOUS (DVT) Referring Phys: Parke Poisson July Linam --------------------------------------------------------------------------------  Indications: Edema.  Comparison Study: No previous exams Performing Technologist: Jody Hill RVT, RDMS  Examination  Guidelines: A complete evaluation includes B-mode imaging, spectral Doppler, color Doppler, and power Doppler as needed of all accessible portions of each vessel. Bilateral testing is considered an integral part of a complete examination. Limited examinations for reoccurring indications may be performed as noted. The reflux portion of the exam is performed with the patient in reverse Trendelenburg.  +---------+---------------+---------+-----------+----------+--------------+ RIGHT    CompressibilityPhasicitySpontaneityPropertiesThrombus Aging +---------+---------------+---------+-----------+----------+--------------+ CFV      Full           Yes      Yes                                 +---------+---------------+---------+-----------+----------+--------------+ SFJ      Full                                                        +---------+---------------+---------+-----------+----------+--------------+ FV Prox  Full           Yes      Yes                                 +---------+---------------+---------+-----------+----------+--------------+ FV Mid   Full           Yes      Yes                                 +---------+---------------+---------+-----------+----------+--------------+ FV DistalFull           Yes      Yes                                 +---------+---------------+---------+-----------+----------+--------------+ PFV      Full                                                        +---------+---------------+---------+-----------+----------+--------------+  POP      Full           Yes      Yes                                 +---------+---------------+---------+-----------+----------+--------------+ PTV      Full                                                        +---------+---------------+---------+-----------+----------+--------------+ PERO     Full                                                         +---------+---------------+---------+-----------+----------+--------------+   +---------+---------------+---------+-----------+----------+--------------+ LEFT     CompressibilityPhasicitySpontaneityPropertiesThrombus Aging +---------+---------------+---------+-----------+----------+--------------+ CFV      Full           Yes      Yes                                 +---------+---------------+---------+-----------+----------+--------------+ SFJ      Full                                                        +---------+---------------+---------+-----------+----------+--------------+ FV Prox  Full           Yes      Yes                                 +---------+---------------+---------+-----------+----------+--------------+ FV Mid   Full           Yes      Yes                                 +---------+---------------+---------+-----------+----------+--------------+ FV DistalFull           Yes      Yes                                 +---------+---------------+---------+-----------+----------+--------------+ PFV      Full                                                        +---------+---------------+---------+-----------+----------+--------------+ POP      Full           Yes      Yes                                 +---------+---------------+---------+-----------+----------+--------------+ PTV  Full                                                        +---------+---------------+---------+-----------+----------+--------------+ PERO     Full                                                        +---------+---------------+---------+-----------+----------+--------------+     Summary: BILATERAL: - No evidence of deep vein thrombosis seen in the lower extremities, bilaterally. -No evidence of popliteal cyst, bilaterally. -Subcutaneous edema seen in areas of calf and ankle, bilaterally.  *See table(s) above for measurements and observations.  Electronically signed by Waverly Ferrari MD on 08/23/2022 at 6:21:40 PM.    Final         Scheduled Meds:  atorvastatin  20 mg Oral Daily   carvedilol  6.25 mg Oral BID WC   empagliflozin  10 mg Oral Daily   enoxaparin (LOVENOX) injection  60 mg Subcutaneous Q24H   furosemide  40 mg Intravenous BID   gabapentin  100 mg Oral QHS   insulin aspart  0-15 Units Subcutaneous TID WC   insulin aspart  0-5 Units Subcutaneous QHS   losartan  100 mg Oral Daily   Continuous Infusions:   LOS: 4 days     Albertine Grates, MD PhD FACP [facility]   If 7PM-7AM, please contact night-coverage [email or web address] Password [facility] 08/25/2022, 11:43 AM

## 2022-08-25 NOTE — Care Management Important Message (Signed)
Important Message  Patient Details IM Letter given. Name: BRENNDEN MASTEN MRN: [id number] Date of Birth: [birth year]   Medicare Important Message Given:  Yes     Caren Macadam 08/25/2022, 12:13 PM

## 2022-08-25 NOTE — Progress Notes (Signed)
Mobility Specialist - Progress Note  Pre-mobility: 94% SpO2 During mobility: 82% SpO2 Post-mobility: 95% SPO2   08/25/22 1429  Oxygen Therapy  O2 Device Room Air  Mobility  Activity Ambulated independently in hallway  Level of Assistance Modified independent, requires aide device or extra time  Assistive Device None  Distance Ambulated (ft) 350 ft  Range of Motion/Exercises Active  Activity Response Tolerated well  Mobility Referral Yes  $Mobility charge 1 Mobility   Pt was found on recliner chair and agreeable to ambulate. Had x3 standing rest breaks due to SPO2 dropping to 82% to practice pursed lip breathing. After ~ 1 min he was able to recover his SPO2 91%. At EOS he was left in room with all necessities in reach and NT in room. RN notified of session.  Billey Chang Mobility Specialist

## 2022-08-25 NOTE — Progress Notes (Signed)
Ok to add hepatitis A/B and CD4 per Dr Roda Shutters for newly dx HIV.  Ulyses Southward, PharmD, BCIDP, AAHIVP, CPP Infectious Disease Pharmacist 08/25/2022 4:25 PM

## 2022-08-25 NOTE — Progress Notes (Signed)
Rounding Note    Patient Name: Mark [name] Date of Encounter: 08/25/2022  [facility] Cardiologist: Armanda Magic, MD    Subjective    61 yo admitted with volume overload, severe dyspnea  Hx of obesity, OSA, HTN, DM O2 sats were 72  Echo  08/22/22:   EF 50-55%.  Severe septal LVH.  No LVOT gradient .   Grade I DD  Mild MR , mild calcification of the AV.  Mean AV gradient of 11 mmHg .   Add Jardiance 10 mg a day  Feeling better today  Still on oxygen.  Ambulated today on room air, sats stayed above 92% Still has some leg edema , he wants to stay 1 more day     Inpatient Medications    Scheduled Meds:  atorvastatin  20 mg Oral Daily   carvedilol  6.25 mg Oral BID WC   empagliflozin  10 mg Oral Daily   enoxaparin (LOVENOX) injection  60 mg Subcutaneous Q24H   furosemide  40 mg Intravenous BID   gabapentin  100 mg Oral QHS   insulin aspart  0-15 Units Subcutaneous TID WC   insulin aspart  0-5 Units Subcutaneous QHS   losartan  100 mg Oral Daily   Continuous Infusions:  PRN Meds: acetaminophen **OR** acetaminophen   Vital Signs    Vitals:   08/24/22 2117 08/24/22 2142 08/25/22 0500 08/25/22 0527  BP: (!) 149/90   (!) 139/90  Pulse: 61   (!) 56  Resp: 18 19  18   Temp: 98.1 F (36.7 C)   98.1 F (36.7 C)  TempSrc: Oral   Oral  SpO2: 98%   98%  Weight:   119.4 kg   Height:        Intake/Output Summary (Last 24 hours) at 08/25/2022 1136 Last data filed at 08/25/2022 0900 Gross per 24 hour  Intake 1258 ml  Output 1075 ml  Net 183 ml       08/25/2022    5:00 AM 08/23/2022    4:01 AM 08/21/2022    5:37 PM  Last 3 Weights  Weight (lbs) 263 lb 3.2 oz 265 lb 8.7 oz 263 lb  Weight (kg) 119.387 kg 120.45 kg 119.296 kg      Telemetry    NSR  - Personally Reviewed  ECG     - Personally Reviewed  Physical Exam   Physical Exam: Blood pressure (!) 139/90, pulse (!) 56, temperature 98.1 F (36.7 C), temperature source Oral, resp.  rate 18, height 5\' 11"  (1.803 m), weight 119.4 kg, SpO2 98 %.      GEN:  obese, middle age male  in no acute distress HEENT: Normal NECK: No JVD; No carotid bruits LYMPHATICS: No lymphadenopathy CARDIAC: RRR  , soft systolic murmur  RESPIRATORY:  Clear to auscultation without rales, wheezing or rhonchi  ABDOMEN: Soft, non-tender, non-distended MUSCULOSKELETAL:  trace - 1+ leg edema ,  No deformity  SKIN: Warm and dry NEUROLOGIC:  Alert and oriented x 3 t   Labs    High Sensitivity Troponin:   Recent Labs  Lab 08/21/22 1001 08/21/22 1755  TROPONINIHS 59* 44*      Chemistry Recent Labs  Lab 08/21/22 1001 08/22/22 0509 08/23/22 0417 08/24/22 0435 08/25/22 0341  NA 145 142 144 143 142  K 3.8 3.6 4.0 4.0 3.8  CL 103 101 102 100 101  CO2 35* 34* 34* 36* 33*  GLUCOSE 108* 100* 117* 110* 112*  BUN 20 19  28* 29* 30*  CREATININE 1.32* 1.33* 1.44* 1.44* 1.34*  CALCIUM 8.8* 8.9 9.3 8.8* 8.8*  PROT 6.1* 6.7  --   --   --   ALBUMIN 3.2* 3.4*  --   --   --   AST 16 18  --   --   --   ALT 23 25  --   --   --   ALKPHOS 79 87  --   --   --   BILITOT 1.0 1.2  --   --   --   GFRNONAA >60 >60 55* 55* 60*  ANIONGAP 7 7 8 7 8      Lipids No results for input(s): "CHOL", "TRIG", "HDL", "LABVLDL", "LDLCALC", "CHOLHDL" in the last 168 hours.  Hematology Recent Labs  Lab 08/23/22 0417 08/24/22 0435 08/25/22 0341  WBC 4.1 3.6* 4.5  RBC 5.28 5.24 4.93  HGB 15.1 14.7 13.9  HCT 51.7 51.2 47.7  MCV 97.9 97.7 96.8  MCH 28.6 28.1 28.2  MCHC 29.2* 28.7* 29.1*  RDW 15.3 15.2 15.0  PLT 130* 126* 125*    Thyroid No results for input(s): "TSH", "FREET4" in the last 168 hours.  BNP Recent Labs  Lab 08/21/22 1001  BNP 241.2*     DDimer No results for input(s): "DDIMER" in the last 168 hours.   Radiology    VAS 08/23/22 LOWER EXTREMITY VENOUS (DVT)  Result Date: 08/23/2022  Lower Venous DVT Study Patient Name:  MILBERN DOESCHER [facility]  Date of Exam:   08/23/2022 Medical Rec #:  08/25/2022          Accession #:    [id number] Date of Birth: 08/03/1961           Patient Gender: M Patient Age:   37 years Exam Location:  [facility] Procedure:      [facility] LOWER EXTREMITY VENOUS (DVT) Referring Phys: [place] XU --------------------------------------------------------------------------------  Indications: Edema.  Comparison Study: No previous exams Performing Technologist: Jody Hill RVT, RDMS  Examination Guidelines: A complete evaluation includes B-mode imaging, spectral Doppler, color Doppler, and power Doppler as needed of all accessible portions of each vessel. Bilateral testing is considered an integral part of a complete examination. Limited examinations for reoccurring indications may be performed as noted. The reflux portion of the exam is performed with the patient in reverse Trendelenburg.  +---------+---------------+---------+-----------+----------+--------------+ RIGHT    CompressibilityPhasicitySpontaneityPropertiesThrombus Aging +---------+---------------+---------+-----------+----------+--------------+ CFV      Full           Yes      Yes                                 +---------+---------------+---------+-----------+----------+--------------+ SFJ      Full                                                        +---------+---------------+---------+-----------+----------+--------------+ FV Prox  Full           Yes      Yes                                 +---------+---------------+---------+-----------+----------+--------------+ FV Mid   Full  Yes      Yes                                 +---------+---------------+---------+-----------+----------+--------------+ FV DistalFull           Yes      Yes                                 +---------+---------------+---------+-----------+----------+--------------+ PFV      Full                                                         +---------+---------------+---------+-----------+----------+--------------+ POP      Full           Yes      Yes                                 +---------+---------------+---------+-----------+----------+--------------+ PTV      Full                                                        +---------+---------------+---------+-----------+----------+--------------+ PERO     Full                                                        +---------+---------------+---------+-----------+----------+--------------+   +---------+---------------+---------+-----------+----------+--------------+ LEFT     CompressibilityPhasicitySpontaneityPropertiesThrombus Aging +---------+---------------+---------+-----------+----------+--------------+ CFV      Full           Yes      Yes                                 +---------+---------------+---------+-----------+----------+--------------+ SFJ      Full                                                        +---------+---------------+---------+-----------+----------+--------------+ FV Prox  Full           Yes      Yes                                 +---------+---------------+---------+-----------+----------+--------------+ FV Mid   Full           Yes      Yes                                 +---------+---------------+---------+-----------+----------+--------------+ FV DistalFull           Yes      Yes                                 +---------+---------------+---------+-----------+----------+--------------+  PFV      Full                                                        +---------+---------------+---------+-----------+----------+--------------+ POP      Full           Yes      Yes                                 +---------+---------------+---------+-----------+----------+--------------+ PTV      Full                                                         +---------+---------------+---------+-----------+----------+--------------+ PERO     Full                                                        +---------+---------------+---------+-----------+----------+--------------+     Summary: BILATERAL: - No evidence of deep vein thrombosis seen in the lower extremities, bilaterally. -No evidence of popliteal cyst, bilaterally. -Subcutaneous edema seen in areas of calf and ankle, bilaterally.  *See table(s) above for measurements and observations. Electronically signed by Waverly Ferrari MD on 08/23/2022 at 6:21:40 PM.    Final     Cardiac Studies      Patient Profile     60 y.o. male    Assessment & Plan     1.  HFrEF :   improving  I/O are -290 for this admission  Cont IV lasix for now.   Switch back to home dose of Torsemide upon DC   Jardiance added yesterday    2. OSA :  does not have CPAP .   Follow up with his primary MD   3.  Morbid obesity :    4. DM:   Jardiance added for CHF as well as DM.   Further management per Dr. Roda Shutters.    Follow up with Dr. Mayford Knife    For questions or updates, please contact [facility] Please consult [email or web address] for contact info under        Signed, Kristeen Miss, MD  08/25/2022, 11:36 AM

## 2022-08-26 ENCOUNTER — Telehealth: Payer: Self-pay | Admitting: Infectious Diseases

## 2022-08-26 DIAGNOSIS — E662 Morbid (severe) obesity with alveolar hypoventilation: Secondary | ICD-10-CM | POA: Diagnosis not present

## 2022-08-26 DIAGNOSIS — I5033 Acute on chronic diastolic (congestive) heart failure: Secondary | ICD-10-CM | POA: Diagnosis not present

## 2022-08-26 LAB — HEPATITIS B SURFACE ANTIBODY, QUANTITATIVE: Hep B S AB Quant (Post): <3.1 m[IU]/mL — ABNORMAL LOW (ref >9.9)

## 2022-08-26 LAB — BASIC METABOLIC PANEL
Anion gap: 7 (ref 5–15)
BUN: 30 mg/dL — ABNORMAL HIGH (ref 8–23)
CO2: 33 mmol/L — ABNORMAL HIGH (ref 22–32)
Calcium: 9.1 mg/dL (ref 8.9–10.3)
Chloride: 103 mmol/L (ref 98–111)
Creatinine, Ser: 1.38 mg/dL — ABNORMAL HIGH (ref 0.61–1.24)
GFR, Estimated: 57 mL/min — ABNORMAL LOW (ref >60)
Glucose, Bld: 98 mg/dL (ref 70–99)
Potassium: 4.7 mmol/L (ref 3.5–5.1)
Sodium: 143 mmol/L (ref 135–145)

## 2022-08-26 LAB — CBC
HCT: 51.1 % (ref 39.0–52.0)
Hemoglobin: 14.7 g/dL (ref 13.0–17.0)
MCH: 28.5 pg (ref 26.0–34.0)
MCHC: 28.8 g/dL — ABNORMAL LOW (ref 30.0–36.0)
MCV: 99 fL (ref 80.0–100.0)
Platelets: 132 10*3/uL — ABNORMAL LOW (ref 150–400)
RBC: 5.16 MIL/uL (ref 4.22–5.81)
RDW: 14.9 % (ref 11.5–15.5)
WBC: 4.2 10*3/uL (ref 4.0–10.5)
nRBC: 0 % (ref 0.0–0.2)

## 2022-08-26 LAB — HIV-1 RNA QUANT-NO REFLEX-BLD
HIV 1 RNA Quant: 15100 copies/mL
LOG10 HIV-1 RNA: 4.179 log10copy/mL

## 2022-08-26 LAB — GLUCOSE, CAPILLARY
Glucose-Capillary: 103 mg/dL — ABNORMAL HIGH (ref 70–99)
Glucose-Capillary: 85 mg/dL (ref 70–99)

## 2022-08-26 LAB — T-HELPER CELLS (CD4) COUNT (NOT AT ARMC)
CD4 % Helper T Cell: 50 % (ref 33–65)
CD4 T Cell Abs: 574 /uL (ref 400–1790)

## 2022-08-26 LAB — HEPATITIS A ANTIBODY, TOTAL: hep A Total Ab: REACTIVE — AB

## 2022-08-26 LAB — MAGNESIUM: Magnesium: 2.6 mg/dL — ABNORMAL HIGH (ref 1.7–2.4)

## 2022-08-26 LAB — PHOSPHORUS: Phosphorus: 4 mg/dL (ref 2.5–4.6)

## 2022-08-26 MED ORDER — EMPAGLIFLOZIN 10 MG PO TABS: 10.0000 mg | ORAL_TABLET | Freq: Every day | ORAL | 0 refills | Status: DC

## 2022-08-26 MED ORDER — LOSARTAN POTASSIUM 100 MG PO TABS: 100.0000 mg | ORAL_TABLET | Freq: Every day | ORAL | 1 refills | Status: DC

## 2022-08-26 NOTE — Progress Notes (Cosign Needed)
SATURATION QUALIFICATIONS: (This note is used to comply with regulatory documentation for home oxygen)  Patient Saturations on Room Air at Rest = 95%  Patient Saturations on Room Air while Ambulating = 89%  Patient Saturations on 2 Liters of oxygen while Ambulating = 96%  Please briefly explain why patient needs home oxygen:  Pt oxygen level drops below 92% while ambulating.   

## 2022-08-26 NOTE — Discharge Summary (Signed)
Physician Discharge Summary  Mark [name] IEP:[id number] DOB: [birth year] DOA: 08/21/2022  PCP: [name], FNP  Admit date: 08/21/2022 Discharge date: 08/26/2022  Time spent: 40 minutes  Recommendations for Outpatient Follow-up:  New medication Jardiance, increase in losartan Needs outpatient follow-up with ID for new diagnosis HIV Dry weight on discharge 118 kg  Discharge Diagnoses:  MAIN problem for hospitalization   Acute decompensated diastolic heart failure [not systolic] New diagnosis of HIV DM TY 2 OSA not on CPAP  Please see below for itemized issues addressed in [place]- refer to other progress notes for clarity if needed  Discharge Condition: Improved  Diet recommendation: Heart healthy diabetic  Filed Weights   08/23/22 0401 08/25/22 0500 08/26/22 0500  Weight: 120.5 kg 119.4 kg 118.3 kg    History of present illness:  61 year old black male known DM TY 2 HTN HLD HF diastolic heart failure baseline Presented with 2 weeks DOE L ET edema Went to office and was seen by Dr. [name] Has was quite short of breath with sats in the 72% range placed on cannula and patient was sent to the hospital for admission  Hospital Course:  HFpEF acute exacerbation of Seen by cardiology diuresed on IV Lasix, going home on home dose of his usual torsemide, Jardiance added, dry weight considered to be 118 pounds  Acute hypoxic respiratory failure OSA not on CPAP Desat screen yielded him dropping to the 86 percentile and it was felt that he would benefit from oxygen this was ordered for the patient Patient was able to mobilize about 350 feet but needed to rest frequently  Bilateral pitting edema Probable gout flare in addition Patient was given TED hose leg was elevated  OSA Needs outpatient sleep study  Thrombocytopenia HIV positive Counseling done by hospitalist Dr. [name] and myself-tried to explain to patient that HIV is now more of a chronic disease but he will need  expert care-discussed over secure chat with Dr. [name] will attempt to arrange close follow-up to have these discussions \-Patient seems immunocompetent CD4 cells 574 and percentage 50% Acute hepatitis series is still pending and HLA [id number] is also pending-outpatient follow-up Tells me his only had 4 lifetime partners never used IV drugs and never had a transfusion  Discharge Exam: Vitals:   08/25/22 2204 08/26/22 0545  BP: 135/83 (!) 149/98  Pulse: 62 72  Resp: 18 18  Temp: 98.6 F (37 C) 98.3 F (36.8 C)  SpO2: 95% 95%    Subj on day of d/c   Awake coherent no distress Seems concerned about his diagnosis No chest pain no fever No short of breath   General Exam on discharge  EOMI NCAT no focal deficit no icterus no pallor no rales no rhonchi Chest is clear no rales no rhonchi no wheeze Abdomen is soft no rebound no guarding ROM is intact Lower extremity edema noted-I did not examine his toes for gout as they were covered up with his socks  Discharge Instructions   Discharge Instructions     ([name]) Call MD:  Anytime you have any of the following symptoms: 1) 3 pound weight gain in 24 hours or 5 pounds in 1 week 2) shortness of breath, with or without a dry hacking cough 3) swelling in the hands, feet or stomach 4) if you have to sleep on extra pillows at night in order to breathe.   Complete by: As directed    Diet - low sodium heart healthy   Complete by:  As directed    Discharge instructions   Complete by: As directed    This hospitalization you were diagnosed with heart failure and you should change the way you take your Cozaar and increase the dose to 100 mg and continue your usual dose of Demadex You were also because of your heart failure placed on Jardiance and you should watch carefully if you have any burning in urine or any weird symptoms-I have discontinued a bunch of meds including ibuprofen colchicine and Mobic as this can cause  dehydration You were also diagnosed this admission with HIV-this seems to be a real result-we have set you up preemptively at the request of Dr. [name] with follow-up with her in the office to discuss implications of this-there is nothing else you need to do at this time-but this does need to be managed by a specialist-people with HIV as well as AIDS can have long and productive lives as I discussed with you in the room and he will have further specialty discussion in the outpatient setting in regards to this   Increase activity slowly   Complete by: As directed       Allergies as of 08/26/2022   No Known Allergies      Medication List     STOP taking these medications    colchicine 0.6 MG tablet   ibuprofen 800 MG tablet Commonly known as: ADVIL   meloxicam 7.5 MG tablet Commonly known as: MOBIC   predniSONE 10 MG tablet Commonly known as: DELTASONE       TAKE these medications    atorvastatin 20 MG tablet Commonly known as: LIPITOR TAKE 1 TABLET BY MOUTH EVERY DAY   carvedilol 6.25 MG tablet Commonly known as: COREG TAKE 1 TABLET BY MOUTH TWICE A DAY WITH FOOD   empagliflozin 10 MG Tabs tablet Commonly known as: JARDIANCE Take 1 tablet (10 mg total) by mouth daily. Start taking on: August 27, 2022   gabapentin 100 MG capsule Commonly known as: NEURONTIN TAKE 1 CAPSULE (100 MG TOTAL) BY MOUTH AT BEDTIME.   losartan 100 MG tablet Commonly known as: COZAAR Take 1 tablet (100 mg total) by mouth daily. Start taking on: August 27, 2022 What changed:  medication strength how much to take additional instructions   metFORMIN 500 MG tablet Commonly known as: GLUCOPHAGE TAKE 1 TABLET BY MOUTH TWICE A DAY WITH BREAKFAST AND DINNER What changed: See the new instructions.   metFORMIN 1000 MG tablet Commonly known as: GLUCOPHAGE TAKE 1 TABLET (1,000 MG TOTAL) BY MOUTH TWICE A DAY WITH FOOD What changed: See the new instructions.   torsemide 20 MG  tablet Commonly known as: DEMADEX TAKE 2 TABLETS (40 MG TOTAL) BY MOUTH DAILY.               Durable Medical Equipment  (From admission, onward)           Start     Ordered   08/26/22 0919  For home use only DME oxygen  Once       Question Answer Comment  Length of Need 6 Months   Mode or (Route) Nasal cannula   Liters per Minute 2   Frequency Continuous (stationary and portable oxygen unit needed)   Oxygen delivery system Gas      08/26/22 0918           No Known Allergies  Follow-up Information     [name], FNP Follow up on 09/03/2022.   Specialty: General Practice  Why: hospital discharge follow up, repeat basic lab works including cbc/bmp at follow up. pcp monitor your platelet count,  kidney function and blood glucose level.  pcp to referral you to outpateint sleep study Contact information: [address] [place] [address] [phone]         [facility]              Follow up.   Why: f/u with HIV result Contact information: [address] [place] [address]        [facility]. Go today.   Specialty: Cardiology Why: Hospital follow up 09/08/2022 @ 2 pm PLEASE bring a current medication list to appointment FREE valet parking, Entrance C, off [employer] information: [address] [id number] [place]        Deberah Pelton, NP Follow up on 09/16/2022.   Specialty: Cardiology Why: [facility] [place] location - 09/16/22 at 1005am for your cardiology appt Contact information: [address] [place] [address] [phone]                  The results of significant diagnostics from this hospitalization (including imaging, microbiology, ancillary and laboratory) are listed below for reference.    Significant Diagnostic  Studies: VAS [place] LOWER EXTREMITY VENOUS (DVT)  Result Date: 08/23/2022  Lower Venous DVT Study Patient Name:  DEANE WATTENBARGER [facility]  Date of Exam:   08/23/2022 Medical Rec #: [id number]          Accession #:    [id number] Date of Birth: [birth year]           Patient Gender: M Patient Age:   72 years Exam Location:  [facility] Procedure:      VAS [place] LOWER EXTREMITY VENOUS (DVT) Referring Phys: Annamaria Boots XU --------------------------------------------------------------------------------  Indications: Edema.  Comparison Study: No previous exams Performing Technologist: Jody Hill RVT, RDMS  Examination Guidelines: A complete evaluation includes B-mode imaging, spectral Doppler, color Doppler, and power Doppler as needed of all accessible portions of each vessel. Bilateral testing is considered an integral part of a complete examination. Limited examinations for reoccurring indications may be performed as noted. The reflux portion of the exam is performed with the patient in reverse Trendelenburg.  +---------+---------------+---------+-----------+----------+--------------+ RIGHT    CompressibilityPhasicitySpontaneityPropertiesThrombus Aging +---------+---------------+---------+-----------+----------+--------------+ CFV      Full           Yes      Yes                                 +---------+---------------+---------+-----------+----------+--------------+ SFJ      Full                                                        +---------+---------------+---------+-----------+----------+--------------+ FV Prox  Full           Yes      Yes                                 +---------+---------------+---------+-----------+----------+--------------+ FV Mid   Full  Yes      Yes                                 +---------+---------------+---------+-----------+----------+--------------+ FV DistalFull           Yes      Yes                                  +---------+---------------+---------+-----------+----------+--------------+ PFV      Full                                                        +---------+---------------+---------+-----------+----------+--------------+ POP      Full           Yes      Yes                                 +---------+---------------+---------+-----------+----------+--------------+ PTV      Full                                                        +---------+---------------+---------+-----------+----------+--------------+ PERO     Full                                                        +---------+---------------+---------+-----------+----------+--------------+   +---------+---------------+---------+-----------+----------+--------------+ LEFT     CompressibilityPhasicitySpontaneityPropertiesThrombus Aging +---------+---------------+---------+-----------+----------+--------------+ CFV      Full           Yes      Yes                                 +---------+---------------+---------+-----------+----------+--------------+ SFJ      Full                                                        +---------+---------------+---------+-----------+----------+--------------+ FV Prox  Full           Yes      Yes                                 +---------+---------------+---------+-----------+----------+--------------+ FV Mid   Full           Yes      Yes                                 +---------+---------------+---------+-----------+----------+--------------+ FV DistalFull           Yes      Yes                                 +---------+---------------+---------+-----------+----------+--------------+  PFV      Full                                                        +---------+---------------+---------+-----------+----------+--------------+ POP      Full           Yes      Yes                                  +---------+---------------+---------+-----------+----------+--------------+ PTV      Full                                                        +---------+---------------+---------+-----------+----------+--------------+ PERO     Full                                                        +---------+---------------+---------+-----------+----------+--------------+     Summary: BILATERAL: - No evidence of deep vein thrombosis seen in the lower extremities, bilaterally. -No evidence of popliteal cyst, bilaterally. -Subcutaneous edema seen in areas of calf and ankle, bilaterally.  *See table(s) above for measurements and observations. Electronically signed by Deitra Mayo MD on 08/23/2022 at 6:21:40 PM.    Final    ECHOCARDIOGRAM COMPLETE  Result Date: 08/22/2022    ECHOCARDIOGRAM REPORT   Patient Name:   KAYRON HICKLIN [facility] Date of Exam: 08/22/2022 Medical Rec #:  [id number]         Height:       71.0 in Accession #:    [id number]        Weight:       263.0 lb Date of Birth:  03/25/1961          BSA:          2.369 m Patient Age:    1 years          BP:           167/96 mmHg Patient Gender: M                 HR:           78 bpm. Exam Location:  Inpatient Procedure: 2D Echo Indications:    CHF  History:        Patient has prior history of Echocardiogram examinations, most                 recent 05/29/2019. Risk Factors:Diabetes, Hypertension and                 Dyslipidemia.  Sonographer:    Harvie Junior Referring Phys: [id number] [place]  1. Severe asymmetrical septal hypertrophy with otherwise moderate LVH of other wall segments. No dynamic LVOT gradient. . Left ventricular ejection fraction, by estimation, is 50 to 55%. The left ventricle has low normal function. The left ventricle has  no regional wall motion abnormalities. There is severe asymmetric left ventricular hypertrophy of the septal  segment. Left ventricular diastolic parameters are consistent with Grade I diastolic  dysfunction (impaired relaxation). Elevated left atrial pressure.  2. Right ventricular systolic function is normal. The right ventricular size is normal. Tricuspid regurgitation signal is inadequate for assessing PA pressure.  3. The mitral valve is abnormal. Mild mitral valve regurgitation. No evidence of mitral stenosis.  4. The aortic valve is tricuspid. There is mild calcification of the aortic valve. There is mild thickening of the aortic valve. Aortic valve regurgitation is not visualized. No aortic stenosis is present.  5. The inferior vena cava is normal in size with greater than 50% respiratory variability, suggesting right atrial pressure of 3 mmHg. FINDINGS  Left Ventricle: Severe asymmetrical septal hypertrophy with otherwise moderate LVH of other wall segments. No dynamic LVOT gradient. Left ventricular ejection fraction, by estimation, is 50 to 55%. The left ventricle has low normal function. The left ventricle has no regional wall motion abnormalities. The left ventricular internal cavity size was normal in size. There is severe asymmetric left ventricular hypertrophy of the septal segment. Left ventricular diastolic parameters are consistent with Grade I diastolic dysfunction (impaired relaxation). Elevated left atrial pressure. Right Ventricle: The right ventricular size is normal. Right vetricular wall thickness was not well visualized. Right ventricular systolic function is normal. Tricuspid regurgitation signal is inadequate for assessing PA pressure. Left Atrium: Left atrial size was normal in size. Right Atrium: Right atrial size was normal in size. Pericardium: There is no evidence of pericardial effusion. Mitral Valve: The mitral valve is abnormal. There is mild thickening of the mitral valve leaflet(s). There is mild calcification of the mitral valve leaflet(s). Mild mitral annular calcification. Mild mitral valve regurgitation. No evidence of mitral valve stenosis. Tricuspid Valve: The  tricuspid valve is normal in structure. Tricuspid valve regurgitation is trivial. No evidence of tricuspid stenosis. Aortic Valve: The aortic valve is tricuspid. There is mild calcification of the aortic valve. There is mild thickening of the aortic valve. There is mild aortic valve annular calcification. Aortic valve regurgitation is not visualized. No aortic stenosis  is present. Aortic valve mean gradient measures 11.0 mmHg. Aortic valve peak gradient measures 19.0 mmHg. Aortic valve area, by VTI measures 3.06 cm. Pulmonic Valve: The pulmonic valve was not well visualized. Pulmonic valve regurgitation is not visualized. No evidence of pulmonic stenosis. Aorta: The aortic root is normal in size and structure. Venous: The inferior vena cava is normal in size with greater than 50% respiratory variability, suggesting right atrial pressure of 3 mmHg. IAS/Shunts: No atrial level shunt detected by color flow Doppler.  LEFT VENTRICLE PLAX 2D LVIDd:         5.10 cm      Diastology LVIDs:         3.80 cm      LV e' medial:    4.13 cm/s LV PW:         1.30 cm      LV E/e' medial:  17.8 LV IVS:        1.90 cm      LV e' lateral:   7.40 cm/s LVOT diam:     2.00 cm      LV E/e' lateral: 9.9 LV SV:         109 LV SV Index:   46 LVOT Area:     3.14 cm  LV Volumes (MOD) LV vol d, MOD A2C: 134.0 ml LV vol d, MOD A4C: 125.0 ml LV vol s, MOD A2C: 66.6 ml LV  vol s, MOD A4C: 62.3 ml LV SV MOD A2C:     67.4 ml LV SV MOD A4C:     125.0 ml LV SV MOD BP:      65.6 ml RIGHT VENTRICLE RV Basal diam:  5.90 cm RV Mid diam:    4.45 cm RV S prime:     13.40 cm/s TAPSE (M-mode): 2.3 cm LEFT ATRIUM             Index        RIGHT ATRIUM           Index LA diam:        4.80 cm 2.03 cm/m   RA Area:     25.60 cm LA Vol (A2C):   54.0 ml 22.80 ml/m  RA Volume:   92.10 ml  38.88 ml/m LA Vol (A4C):   51.4 ml 21.70 ml/m LA Biplane Vol: 53.9 ml 22.75 ml/m  AORTIC VALVE                     PULMONIC VALVE AV Area (Vmax):    2.87 cm      PV Vmax:           1.89 m/s AV Area (Vmean):   2.57 cm      PV Peak grad:     14.3 mmHg AV Area (VTI):     3.06 cm      PR End Diast Vel: 13.54 msec AV Vmax:           218.00 cm/s AV Vmean:          159.000 cm/s AV VTI:            0.356 m AV Peak Grad:      19.0 mmHg AV Mean Grad:      11.0 mmHg LVOT Vmax:         199.00 cm/s LVOT Vmean:        130.000 cm/s LVOT VTI:          0.347 m LVOT/AV VTI ratio: 0.97  AORTA Ao Root diam: 3.30 cm Ao Asc diam:  3.30 cm MITRAL VALVE MV Area (PHT): 3.42 cm     SHUNTS MV Decel Time: 222 msec     Systemic VTI:  0.35 m MR Peak grad: 55.4 mmHg     Systemic Diam: 2.00 cm MR Vmax:      372.00 cm/s MV E velocity: 73.50 cm/s MV A velocity: 115.00 cm/s MV E/A ratio:  0.64 Carlyle Dolly MD Electronically signed by Carlyle Dolly MD Signature Date/Time: 08/22/2022/12:05:32 PM    Final    CT Angio Chest PE W and/or Wo Contrast  Result Date: 08/21/2022 CLINICAL DATA:  Left lower extremity swelling, hypoxia. EXAM: CT ANGIOGRAPHY CHEST WITH CONTRAST TECHNIQUE: Multidetector CT imaging of the chest was performed using the standard protocol during bolus administration of intravenous contrast. Multiplanar CT image reconstructions and MIPs were obtained to evaluate the vascular anatomy. RADIATION DOSE REDUCTION: This exam was performed according to the departmental dose-optimization program which includes automated exposure control, adjustment of the mA and/or kV according to patient size and/or use of iterative reconstruction technique. CONTRAST:  168m OMNIPAQUE IOHEXOL 350 MG/ML SOLN COMPARISON:  None Available. FINDINGS: Cardiovascular: Satisfactory opacification of the pulmonary arteries to the segmental level. No evidence of pulmonary embolism. The heart is mildly enlarged. No pericardial effusion. Main pulmonary trunk is dilated measuring up to 3.8 cm concerning for pulmonary arterial hypertension. Mediastinum/Nodes: No enlarged mediastinal, hilar, or  axillary lymph nodes. Thyroid gland,  trachea, and esophagus demonstrate no significant findings. Lungs/Pleura: Lungs are clear. No pleural effusion or pneumothorax. Bibasilar dependent atelectasis. Upper Abdomen: No acute abnormality. Musculoskeletal: Idiopathic skeletal hyperostosis. No acute osseous abnormality. Review of the MIP images confirms the above findings. IMPRESSION: 1. No evidence of pulmonary embolism. 2. Main pulmonary trunk is dilated measuring up to 3.8 cm concerning for pulmonary arterial hypertension. 3. Lungs are clear without evidence of acute pulmonary process. Electronically Signed   By: Keane Police D.O.   On: 08/21/2022 13:55   DG Chest 2 View  Result Date: 08/21/2022 CLINICAL DATA:  shortness of breath, fluid overload EXAM: PORTABLE CHEST 1 VIEW COMPARISON:  08/17/2022 FINDINGS: Cardiac silhouette is prominent. There is pulmonary interstitial prominence with vascular congestion. No focal consolidation. No pneumothorax or pleural effusion identified. Aorta is calcified. IMPRESSION: Findings suggest CHF. Electronically Signed   By: Sammie Bench M.D.   On: 08/21/2022 10:44    Microbiology: Recent Results (from the past 240 hour(s))  Resp panel by RT-PCR (RSV, Flu A&B, Covid) Anterior Nasal Swab     Status: None   Collection Time: 08/21/22 10:33 AM   Specimen: Anterior Nasal Swab  Result Value Ref Range Status   SARS Coronavirus 2 by RT PCR NEGATIVE NEGATIVE Final    Comment: (NOTE) SARS-CoV-2 target nucleic acids are NOT DETECTED.  The SARS-CoV-2 RNA is generally detectable in upper respiratory specimens during the acute phase of infection. The lowest concentration of SARS-CoV-2 viral copies this assay can detect is 138 copies/mL. A negative result does not preclude SARS-Cov-2 infection and should not be used as the sole basis for treatment or other patient management decisions. A negative result may occur with  improper specimen collection/handling, submission of specimen other than nasopharyngeal  swab, presence of viral mutation(s) within the areas targeted by this assay, and inadequate number of viral copies(<138 copies/mL). A negative result must be combined with clinical observations, patient history, and epidemiological information. The expected result is Negative.  Fact Sheet for Patients:  EntrepreneurPulse.com.au  Fact Sheet for Healthcare Providers:  IncredibleEmployment.be  This test is no t yet approved or cleared by the [place] FDA and  has been authorized for detection and/or diagnosis of SARS-CoV-2 by FDA under an Emergency Use Authorization (EUA). This EUA will remain  in effect (meaning this test can be used) for the duration of the COVID-19 declaration under Section 564(b)(1) of the Act, 21 U.S.C.section 360bbb-3(b)(1), unless the authorization is terminated  or revoked sooner.       Influenza A by PCR NEGATIVE NEGATIVE Final   Influenza B by PCR NEGATIVE NEGATIVE Final    Comment: (NOTE) The Xpert Xpress SARS-CoV-2/FLU/RSV plus assay is intended as an aid in the diagnosis of influenza from Nasopharyngeal swab specimens and should not be used as a sole basis for treatment. Nasal washings and aspirates are unacceptable for Xpert Xpress SARS-CoV-2/FLU/RSV testing.  Fact Sheet for Patients: EntrepreneurPulse.com.au  Fact Sheet for Healthcare Providers: IncredibleEmployment.be  This test is not yet approved or cleared by the [place] FDA and has been authorized for detection and/or diagnosis of SARS-CoV-2 by FDA under an Emergency Use Authorization (EUA). This EUA will remain in effect (meaning this test can be used) for the duration of the COVID-19 declaration under Section 564(b)(1) of the Act, 21 U.S.C. section 360bbb-3(b)(1), unless the authorization is terminated or revoked.     Resp Syncytial Virus by PCR NEGATIVE NEGATIVE Final    Comment: (NOTE) Fact Sheet  for  Patients: EntrepreneurPulse.com.au  Fact Sheet for Healthcare Providers: IncredibleEmployment.be  This test is not yet approved or cleared by the [place] FDA and has been authorized for detection and/or diagnosis of SARS-CoV-2 by FDA under an Emergency Use Authorization (EUA). This EUA will remain in effect (meaning this test can be used) for the duration of the COVID-19 declaration under Section 564(b)(1) of the Act, 21 U.S.C. section 360bbb-3(b)(1), unless the authorization is terminated or revoked.  Performed at [name], [place] [address], [place], [place] [address]      Labs: Basic Metabolic Panel: Recent Labs  Lab 08/22/22 0509 08/23/22 0417 08/24/22 0435 08/25/22 0341 08/26/22 0458  NA 142 144 143 142 143  K 3.6 4.0 4.0 3.8 4.7  CL 101 102 100 101 103  CO2 34* 34* 36* 33* 33*  GLUCOSE 100* 117* 110* 112* 98  BUN 19 28* 29* 30* 30*  CREATININE 1.33* 1.44* 1.44* 1.34* 1.38*  CALCIUM 8.9 9.3 8.8* 8.8* 9.1  MG  --   --   --   --  2.6*  PHOS  --   --   --   --  4.0   Liver Function Tests: Recent Labs  Lab 08/21/22 1001 08/22/22 0509  AST 16 18  ALT 23 25  ALKPHOS 79 87  BILITOT 1.0 1.2  PROT 6.1* 6.7  ALBUMIN 3.2* 3.4*   No results for input(s): "LIPASE", "AMYLASE" in the last 168 hours. No results for input(s): "AMMONIA" in the last 168 hours. CBC: Recent Labs  Lab 08/21/22 1001 08/22/22 0509 08/23/22 0417 08/24/22 0435 08/25/22 0341 08/26/22 0458  WBC 4.8 4.6 4.1 3.6* 4.5 4.2  NEUTROABS 3.2 2.7  --   --   --   --   HGB 14.3 15.1 15.1 14.7 13.9 14.7  HCT 49.2 51.7 51.7 51.2 47.7 51.1  MCV 96.9 97.9 97.9 97.7 96.8 99.0  PLT 126* 129* 130* 126* 125* 132*   Cardiac Enzymes: No results for input(s): "CKTOTAL", "CKMB", "CKMBINDEX", "TROPONINI" in the last 168 hours. BNP: BNP (last 3 results) Recent Labs    08/21/22 1001  BNP 241.2*    ProBNP (last 3 results) No results for  input(s): "PROBNP" in the last 8760 hours.  CBG: Recent Labs  Lab 08/25/22 1156 08/25/22 1644 08/25/22 2210 08/26/22 0737 08/26/22 1150  GLUCAP 98 131* 176* 103* 85       Signed:  Nita Sells MD   [facility] 08/26/2022, 12:50 PM

## 2022-08-26 NOTE — Progress Notes (Signed)
Patient discharged as ordered. D/C paperwork explained to pt prior to D/C. Pt aware of all follow-up appointments, including importance of ID appointment. Heart failure instructions also explained to pt, including diet. Pt assisted to friend's vehicle with assist from nursing staff. Home O2 sent with patient.

## 2022-08-26 NOTE — Progress Notes (Signed)
Mobility Specialist - Progress Note  Pre-mobility: 96% SpO2 During mobility: 89% SpO2 Post-mobility: 94% SPO2   08/26/22 0952  Oxygen Therapy  O2 Device Nasal Cannula  O2 Flow Rate (L/min) 2 L/min  Mobility  Activity Ambulated independently in hallway  Level of Assistance Modified independent, requires aide device or extra time  Assistive Device None  Distance Ambulated (ft) 350 ft  Range of Motion/Exercises Active  Activity Response Tolerated well  Mobility Referral Yes  $Mobility charge 1 Mobility   Pt was found on recliner chair and agreeable to ambulate. Had no complaints during session and at EOS returned to recliner chair. Was able to bring up SPO2 from 89% to >90% within seconds and was left with necessities in reach.  Billey Chang Mobility Specialist

## 2022-08-26 NOTE — Progress Notes (Signed)
Rounding Note    Patient Name: Mark [name] Date of Encounter: 08/26/2022  [facility] Cardiologist:  O'Neal   Subjective    61 yo admitted with volume overload, severe dyspnea  Hx of obesity, OSA, HTN, DM O2 sats were 72  Echo  08/22/22:   EF 50-55%.  Severe septal LVH.  No LVOT gradient .   Grade I DD  Mild MR , mild calcification of the AV.  Mean AV gradient of 11 mmHg .   Add Jardiance 10 mg a day  Feeling better today  Still on oxygen.  Diuresing well  I/O are -2.3 liters so far this admission  Still becomes hypoxic (82%)  with ambulation on room air  Borderline hypoxic ( 90%) on 2L O2 with ambulation   He has an O2 concentrator and a portable tank in the room with him.   He does not want to use the tank but I have encouraged him that he needs to use the home O2 continuously until his lungs improve    Inpatient Medications    Scheduled Meds:  atorvastatin  20 mg Oral Daily   carvedilol  6.25 mg Oral BID WC   empagliflozin  10 mg Oral Daily   enoxaparin (LOVENOX) injection  60 mg Subcutaneous Q24H   furosemide  40 mg Intravenous BID   gabapentin  100 mg Oral QHS   insulin aspart  0-15 Units Subcutaneous TID WC   insulin aspart  0-5 Units Subcutaneous QHS   losartan  100 mg Oral Daily   Continuous Infusions:  PRN Meds: acetaminophen **OR** acetaminophen   Vital Signs    Vitals:   08/25/22 1432 08/25/22 2204 08/26/22 0500 08/26/22 0545  BP: (!) 133/93 135/83  (!) 149/98  Pulse: 69 62  72  Resp: 20 18  18   Temp: 98.3 F (36.8 C) 98.6 F (37 C)  98.3 F (36.8 C)  TempSrc: Oral Oral  Oral  SpO2: 99% 95%  95%  Weight:   118.3 kg   Height:        Intake/Output Summary (Last 24 hours) at 08/26/2022 1129 Last data filed at 08/26/2022 08/28/2022 Gross per 24 hour  Intake 838 ml  Output 2700 ml  Net -1862 ml       08/26/2022    5:00 AM 08/25/2022    5:00 AM 08/23/2022    4:01 AM  Last 3 Weights  Weight (lbs) 260 lb 11.2 oz 263 lb  3.2 oz 265 lb 8.7 oz  Weight (kg) 118.253 kg 119.387 kg 120.45 kg      Telemetry    NSR  - Personally Reviewed  ECG     - Personally Reviewed  Physical Exam    Physical Exam: Blood pressure (!) 149/98, pulse 72, temperature 98.3 F (36.8 C), temperature source Oral, resp. rate 18, height 5\' 11"  (1.803 m), weight 118.3 kg, SpO2 95 %.      GEN:  moderately obese male,  in no acute distress HEENT: Normal NECK: No JVD; No carotid bruits LYMPHATICS: No lymphadenopathy CARDIAC: RRR  2/6 systolic murmur  RESPIRATORY:  Clear to auscultation without rales, wheezing or rhonchi  ABDOMEN: Soft, non-tender, non-distended MUSCULOSKELETAL:  No edema; No deformity  SKIN: Warm and dry NEUROLOGIC:  Alert and oriented x 3   Labs    High Sensitivity Troponin:   Recent Labs  Lab 08/21/22 1001 08/21/22 1755  TROPONINIHS 59* 44*      Chemistry Recent Labs  Lab 08/21/22 1001  08/22/22 0509 08/23/22 0417 08/24/22 0435 08/25/22 0341 08/26/22 0458  NA 145 142   < > 143 142 143  K 3.8 3.6   < > 4.0 3.8 4.7  CL 103 101   < > 100 101 103  CO2 35* 34*   < > 36* 33* 33*  GLUCOSE 108* 100*   < > 110* 112* 98  BUN 20 19   < > 29* 30* 30*  CREATININE 1.32* 1.33*   < > 1.44* 1.34* 1.38*  CALCIUM 8.8* 8.9   < > 8.8* 8.8* 9.1  MG  --   --   --   --   --  2.6*  PROT 6.1* 6.7  --   --   --   --   ALBUMIN 3.2* 3.4*  --   --   --   --   AST 16 18  --   --   --   --   ALT 23 25  --   --   --   --   ALKPHOS 79 87  --   --   --   --   BILITOT 1.0 1.2  --   --   --   --   GFRNONAA >60 >60   < > 55* 60* 57*  ANIONGAP 7 7   < > 7 8 7    < > = values in this interval not displayed.     Lipids No results for input(s): "CHOL", "TRIG", "HDL", "LABVLDL", "LDLCALC", "CHOLHDL" in the last 168 hours.  Hematology Recent Labs  Lab 08/24/22 0435 08/25/22 0341 08/26/22 0458  WBC 3.6* 4.5 4.2  RBC 5.24 4.93 5.16  HGB 14.7 13.9 14.7  HCT 51.2 47.7 51.1  MCV 97.7 96.8 99.0  MCH 28.1 28.2 28.5   MCHC 28.7* 29.1* 28.8*  RDW 15.2 15.0 14.9  PLT 126* 125* 132*    Thyroid No results for input(s): "TSH", "FREET4" in the last 168 hours.  BNP Recent Labs  Lab 08/21/22 1001  BNP 241.2*     DDimer No results for input(s): "DDIMER" in the last 168 hours.   Radiology    No results found.  Cardiac Studies      Patient Profile     61 y.o. male    Assessment & Plan     1.  HFrEF :   improving  I/O are -290 for this admission  Cont IV lasix for now.   Switch back to home dose of Torsemide upon DC   Jardiance added  Has diuresed well  He was eating lots of processed foods.     2. OSA :  does not have CPAP .   Follow up with his primary MD , Follow up with Dr. 64   3.  Morbid obesity :   likely has obesity hypoventilation    4. DM:   Jardiance added for CHF as well as DM.   Further management per Dr. Scharlene Gloss.       For questions or updates, please contact [facility] Please consult [email or web address] for contact info under        Signed, Roda Shutters, MD  08/26/2022, 11:29 AM

## 2022-08-26 NOTE — Telephone Encounter (Signed)
Rcvd message from Dr. Elinor Parkinson in regards of pt needing to be scheduled. Pt is currently admitted at [name] and unsure of exp d/c, but attempted to call pt. No answer and left a VM.   Pt is in need to be sched for a New B20 appt - per Dr. Elinor Parkinson

## 2022-08-26 NOTE — Progress Notes (Signed)
SATURATION QUALIFICATIONS: (This note is used to comply with regulatory documentation for home oxygen)  Patient Saturations on Room Air at Rest = 95%  Patient Saturations on Room Air while Ambulating = 82%  Patient Saturations on 2 Liters of oxygen while Ambulating = 90%  Please briefly explain why patient needs home oxygen: 

## 2022-08-26 NOTE — Progress Notes (Signed)
Mobility Specialist - Progress Note  Pre-mobility: 98% SpO2 During mobility: 90% SpO2 Post-mobility: 93% SPO2   08/26/22 1500  Oxygen Therapy  O2 Device Nasal Cannula  O2 Flow Rate (L/min) 2 L/min  Mobility  Activity Ambulated independently in hallway  Level of Assistance Modified independent, requires aide device or extra time  Assistive Device None  Distance Ambulated (ft) 350 ft  Range of Motion/Exercises Active  Activity Response Tolerated well  Mobility Referral Yes  $Mobility charge 1 Mobility   Pt was found on recliner chair and agreeable to ambulate. Had no complaints during session and at EOS returned to recliner chair with necessities in reach. RN notified of session.  Mark [name] Mobility Specialist

## 2022-08-26 NOTE — TOC Transition Note (Signed)
Transition of Care [facility]) - CM/SW Discharge Note   Patient Details  Name: Mark [name] MRN: [id number] Date of Birth: Feb 24, 1961  Transition of Care [facility]) CM/SW Contact:  Golda Acre, RN Phone Number: 08/26/2022, 1:29 PM   Clinical Narrative:    Patient dcd with o2 through [employer]   Final next level of care: Home w Home Health Services Barriers to Discharge: Barriers Resolved   Patient Goals and CMS Choice      Discharge Placement                         Discharge Plan and Services Additional resources added to the After Visit Summary for       Post Acute Care Choice: Durable Medical Equipment          DME Arranged: Oxygen DME Agency: [employer] Date DME Agency Contacted: 08/26/22 Time DME Agency Contacted: 1020 Representative spoke with at DME Agency: [email or web address]            Social Determinants of Health (SDOH) Interventions SDOH Screenings   Food Insecurity: No Food Insecurity (08/21/2022)  Housing: Low Risk  (08/21/2022)  Transportation Needs: No Transportation Needs (08/21/2022)  Utilities: Not At Risk (08/21/2022)  Depression (PHQ2-9): Low Risk  (04/08/2022)  Financial Resource Strain: Low Risk  (04/08/2022)  Physical Activity: Inactive (04/08/2022)  Stress: No Stress Concern Present (04/08/2022)  Tobacco Use: Medium Risk (08/24/2022)     Readmission Risk Interventions   Row Labels 08/23/2022    1:41 PM  Readmission Risk Prevention Plan   Section Header. No data exists in this row.   Post Dischage Appt   Complete  Medication Screening   Complete  Transportation Screening   Complete

## 2022-08-27 ENCOUNTER — Telehealth: Payer: Self-pay

## 2022-08-27 LAB — RPR: RPR Ser Ql: NONREACTIVE

## 2022-08-27 NOTE — Patient Outreach (Addendum)
  Care Coordination TOC Note Transition Care Management Follow-up Telephone Call Date of discharge and from where: 08/26/22-[facility] Dx: "hypoxia" How have you been since you were released from the hospital? Patient states he is doing okay. He admits that he is a little anxious and nervous about his mew medical diagnoses. He is planning to go grocery shopping today to change his diet and eating habits. He will get a scale while out as well and plan to start monitoring wgt. RN CM reviewed proper wgt techniques and s/s of abnormal wgt gain and when to alert MD. He voiced understanding. Patient agreeable that he could use some ongoing support an education to assist him with managing his medical issues and agreeable to [facility] RN CM being assigned.  Any questions or concerns? No  Items Reviewed: Did the pt receive and understand the discharge instructions provided? Yes  Medications obtained and verified? Yes  Other? Yes -HF mgmt Any new allergies since your discharge? No  Dietary orders reviewed? Yes Do you have support at home? Yes   Home Care and Equipment/Supplies: Were home health services ordered? not applicable If so, what is the name of the agency? N/A  Has the agency set up a time to come to the patient's home? not applicable Were any new equipment or medical supplies ordered?  Yes: oxygen What is the name of the medical supply agency? [employer] Were you able to get the supplies/equipment? Has concentrator-they will be bringing portable tanks tomorrow Do you have any questions related to the use of the equipment or supplies? No  Functional Questionnaire: (I = Independent and D = Dependent) ADLs: I  Bathing/Dressing- I  Meal Prep- I  Eating- I  Maintaining continence- I  Transferring/Ambulation- I  Managing Meds- I  Follow up appointments reviewed:  PCP Hospital f/u appt confirmed? Yes  Scheduled to see [name], FNP on 09/03/22 @ [facility]. Specialist Hospital f/u appt  confirmed? Yes  Scheduled to see Scheryl Marten 09/16/22-10:05am, [facility] 09/08/22-2pm, [facility]-New Pt/TOC Clinic-1/9/224-2pm. . Are transportation arrangements needed? No  If their condition worsens, is the pt aware to call PCP or go to the Emergency Dept.? Yes Was the patient provided with contact information for the PCP's office or ED? Yes Was to pt encouraged to call back with questions or concerns? Yes  SDOH assessments and interventions completed:   Yes SDOH Interventions Today    Flowsheet Row Most Recent Value  SDOH Interventions   Food Insecurity Interventions Intervention Not Indicated  Transportation Interventions Intervention Not Indicated       Care Coordination Interventions:  Referred for Care Coordination Services:  RN Care Coordinator Education provided    Encounter Outcome:  Pt. Visit Completed    Alessandra Grout [facility] Care Management Telephonic Care Management Coordinator Direct Phone: [phone] Toll Free: [phone] Fax: [phone]

## 2022-08-27 NOTE — Telephone Encounter (Signed)
Transition Care Management Follow-up Telephone Call Date of discharge and from where: 08/26/2022 How have you been since you were released from the hospital? Pt states he is doing better & breathing better.  Any questions or concerns? No  Items Reviewed: Did the pt receive and understand the discharge instructions provided? Yes  Medications obtained and verified? Yes  Other? Yes  Any new allergies since your discharge? No  Dietary orders reviewed? Yes Do you have support at home? Yes   Home Care and Equipment/Supplies: Were home health services ordered? no If so, what is the name of the agency? N/a  Has the agency set up a time to come to the patient's home? no Were any new equipment or medical supplies ordered?  No What is the name of the medical supply agency? N/a Were you able to get the supplies/equipment? no Do you have any questions related to the use of the equipment or supplies? No  Functional Questionnaire: (I = Independent and D = Dependent) ADLs: i  Bathing/Dressing- i  Meal Prep- i  Eating- i  Maintaining continence- i  Transferring/Ambulation- i  Managing Meds- i  Follow up appointments reviewed:  PCP Hospital f/u appt confirmed? Yes  Scheduled to see [name]  on 09/03/2021  @ [facility] . Specialist Hospital f/u appt confirmed? No  Scheduled to see n/a on n/a @ n/a. Are transportation arrangements needed? No  If their condition worsens, is the pt aware to call PCP or go to the Emergency Dept.? Yes Was the patient provided with contact information for the PCP's office or ED? Yes Was to pt encouraged to call back with questions or concerns? Yes

## 2022-08-27 NOTE — Telephone Encounter (Signed)
Transition Care Management Unsuccessful Follow-up Telephone Call  Date of discharge and from where:  08/26/2022 [place]   Attempts:  1st Attempt  Reason for unsuccessful TCM follow-up call:  Left voice message    

## 2022-09-02 LAB — HLA B*5701: HLA B 5701: NEGATIVE

## 2022-09-03 ENCOUNTER — Encounter: Payer: Self-pay | Admitting: Nurse Practitioner

## 2022-09-03 ENCOUNTER — Ambulatory Visit (INDEPENDENT_AMBULATORY_CARE_PROVIDER_SITE_OTHER): Payer: Medicare HMO | Admitting: Nurse Practitioner

## 2022-09-03 VITALS — BP 138/72 | HR 82 | Temp 99.2°F | Ht 71.0 in | Wt 253.0 lb

## 2022-09-03 DIAGNOSIS — Z21 Asymptomatic human immunodeficiency virus [HIV] infection status: Secondary | ICD-10-CM

## 2022-09-03 DIAGNOSIS — E782 Mixed hyperlipidemia: Secondary | ICD-10-CM

## 2022-09-03 DIAGNOSIS — I5033 Acute on chronic diastolic (congestive) heart failure: Secondary | ICD-10-CM | POA: Diagnosis not present

## 2022-09-03 DIAGNOSIS — Z9981 Dependence on supplemental oxygen: Secondary | ICD-10-CM

## 2022-09-03 DIAGNOSIS — E1165 Type 2 diabetes mellitus with hyperglycemia: Secondary | ICD-10-CM

## 2022-09-03 DIAGNOSIS — I110 Hypertensive heart disease with heart failure: Secondary | ICD-10-CM

## 2022-09-03 DIAGNOSIS — Z6835 Body mass index (BMI) 35.0-35.9, adult: Secondary | ICD-10-CM | POA: Diagnosis not present

## 2022-09-03 DIAGNOSIS — G4733 Obstructive sleep apnea (adult) (pediatric): Secondary | ICD-10-CM | POA: Diagnosis not present

## 2022-09-03 DIAGNOSIS — I5031 Acute diastolic (congestive) heart failure: Secondary | ICD-10-CM

## 2022-09-03 NOTE — Progress Notes (Addendum)
I,Mark [name],acting as a Education administrator for [employer], FNP.,have documented all relevant documentation on the behalf of Mark Brine, FNP,as directed by  Mark Brine, FNP while in the presence of Mark [name], [place].  Virtual Visit via Video Note  I connected with Mark [name] on 10/29/22 at  2:40 PM EST by a video enabled telemedicine application and verified that I am speaking with the correct person using two identifiers.  Location: Patient: spoke with Mark [name] Provider: location at office   I discussed the limitations of evaluation and management by telemedicine and the availability of in person appointments. The patient expressed understanding and agreed to proceed.   Subjective:     Patient ID: Mark [name] , male    DOB: 1961/03/14 , 62 y.o.   MRN: [id number]   Chief Complaint  Patient presents with   Hospitalization Follow-up    HPI  Virtual visit by telephone unable to see patient on screen with video today for hospital follow-up was here in office but did not stay due to not having his portable tank and felt like he was unable to breath.  Patient was unable to stay in the office.  He would like the portable oxygen tank. When admitted to the hospital he was having shortness of breath and leg swelling, he was at the Cardiologist and was transferred to ER via EMS. Oxygenation on room air was 74%. He was diagnosed with acute heart fairly, was diuresed with IV lasix. He was discharged home with torsemide and Jardiance. He was also diagnosed with acute hypoxic respiratory failure. Has a history of OSA and not on CPAP. During his hospitalization he was able to walk 350 feet before having to take a rest, felt he would benefit from oxygen at home. He had bilateral edema and was given TED hose and thought to have a gout component.   He had thrombocytopenia HIV positive. Does not understand how this happened. According the discharge note he seems to be immunocompetent with  CD4 cells 574 and percentage 50%. Acute hepatitis and HLA B5701 labs are normal.     He feels when smoking weed and someone had an open area on his lip when they were passing it around.   Diabetes He presents for his follow-up diabetic visit. He has type 2 diabetes mellitus. There are no hypoglycemic associated symptoms. Pertinent negatives for hypoglycemia include no dizziness or headaches. There are no diabetic associated symptoms. Pertinent negatives for diabetes include no chest pain, no fatigue, no polydipsia, no polyphagia and no polyuria. There are no hypoglycemic complications. There are no diabetic complications. Risk factors for coronary artery disease include diabetes mellitus, hypertension, sedentary lifestyle, obesity and male sex. He is compliant with treatment most of the time. His weight is stable. He is following a diabetic diet. He has not had a previous visit with a dietitian. He rarely participates in exercise. There is no change in his home blood glucose trend. (Checking blood sugar but does not tell me the reading) An ACE inhibitor/angiotensin II receptor blocker is being taken. He does not see a podiatrist.Eye exam is not current.  Hypertension This is a chronic problem. The current episode started more than 1 year ago. The problem is unchanged. The problem is controlled. Pertinent negatives include no anxiety, chest pain, headaches or palpitations. Past treatments include angiotensin blockers and lifestyle changes. There are no compliance problems.  There is no history of angina. There is no history of chronic renal disease.  Past Medical History:  Diagnosis Date   Abnormal lung function test    a. Reported to possibly be COPD, but per pulm note: "Possible COPD - PFT was more suggestive of restrictive defect and diffusion defect likely from obesity and CHF"   Chronic diastolic heart failure ([place]) 05/07/2018   CKD (chronic kidney disease), stage III (HCC)    Diabetes mellitus  type 2 in obese [name])    Elevated troponin 05/07/2018   Hearing impaired    Hyperlipidemia    Hypertension    LBBB (left bundle branch block)    LVH (left ventricular hypertrophy)    Mild aortic stenosis    Mild pulmonary hypertension (HCC)    Morbid obesity (HCC)    Sleep apnea      Family History  Problem Relation Age of Onset   Hypertension Mother    Hypertension Father      Current Outpatient Medications:    atorvastatin (LIPITOR) 20 MG tablet, TAKE 1 TABLET BY MOUTH EVERY DAY (Patient taking differently: Take 20 mg by mouth daily.), Disp: 90 tablet, Rfl: 0   carvedilol (COREG) 6.25 MG tablet, TAKE 1 TABLET BY MOUTH TWICE A DAY WITH FOOD (Patient taking differently: Take 6.25 mg by mouth 2 (two) times daily with a meal.), Disp: 180 tablet, Rfl: 1   empagliflozin (JARDIANCE) 10 MG TABS tablet, Take 1 tablet (10 mg total) by mouth daily., Disp: 30 tablet, Rfl: 0   losartan (COZAAR) 100 MG tablet, Take 1 tablet (100 mg total) by mouth daily., Disp: 30 tablet, Rfl: 1   metFORMIN (GLUCOPHAGE) 1000 MG tablet, TAKE 1 TABLET (1,000 MG TOTAL) BY MOUTH TWICE A DAY WITH FOOD (Patient taking differently: Take 1,000 mg by mouth 2 (two) times daily with a meal.), Disp: 180 tablet, Rfl: 1   torsemide (DEMADEX) 20 MG tablet, TAKE 2 TABLETS (40 MG TOTAL) BY MOUTH DAILY., Disp: 180 tablet, Rfl: 1   No Known Allergies   Review of Systems  Constitutional: Negative.  Negative for fatigue.  Respiratory: Negative.    Cardiovascular: Negative.  Negative for chest pain and palpitations.  Gastrointestinal: Negative.   Endocrine: Negative for polydipsia, polyphagia and polyuria.  Neurological: Negative.  Negative for dizziness and headaches.     Today's Vitals   09/03/22 1042  BP: 138/72  Pulse: 82  Temp: 99.2 F (37.3 C)  TempSrc: Oral  SpO2: 90%  Weight: 253 lb (114.8 kg)  Height: '5\' 11"'$  (1.803 m)   Body mass index is 35.29 kg/m.  Wt Readings from Last 3 Encounters:  09/03/22 253 lb  (114.8 kg)  08/26/22 260 lb 11.2 oz (118.3 kg)  08/21/22 266 lb 3.2 oz (120.7 kg)    Objective:  Physical Exam Vitals reviewed.  Constitutional:      General: He is not in acute distress.    Appearance: Normal appearance.  Pulmonary:     Effort: Pulmonary effort is normal. No respiratory distress.  Neurological:     General: No focal deficit present.     Mental Status: He is alert and oriented to person, place, and time.  Psychiatric:        Mood and Affect: Mood normal.        Behavior: Behavior normal.        Thought Content: Thought content normal.        Judgment: Judgment normal.         Assessment And Plan:     1. Acute on chronic diastolic (congestive) heart failure (HCC) Comments:  He was started on Jardiance and increased his losartan. Continue f/u with Cardiology. TCM Performed. A member of the clinical team spoke with the patient upon dischare. Discharge summary was reviewed in full detail during the visit. Meds reconciled and compared to discharge meds. Medication list is updated and reviewed with the patient.  Greater than 50% face to face time was spent in counseling an coordination of care.  All questions were answered to the satisfaction of the patient.   - Ambulatory referral to Home Health  2. Asymptomatic HIV infection, with no history of HIV-related illness ([place]) Comments: This is a new diagnosis.  Referral placed to infectious disease and advised to follow-up - Ambulatory referral to Infectious Disease - Ambulatory referral to [place]  3. Mixed hyperlipidemia Comments: Advised to take statin, tolerating well.  4. OSA (obstructive sleep apnea) Comments: Will refer for a sleep study as he is not on CPAP - Ambulatory referral to Sleep Studies  5. Type 2 diabetes mellitus with hyperglycemia, without long-term current use of insulin (HCC) Comments: HgbA1c is stable. Continue current medications and Jardiance may benefit as well  6. Class 2 severe  obesity due to excess calories with serious comorbidity and body mass index (BMI) of 35.0 to 35.9 in adult [name]) He is encouraged to initially strive for BMI less than 30 to decrease cardiac risk. He is advised to exercise no less than 150 minutes per week.   7. Supplemental oxygen dependent Comments: Has needed oxygen since hospitalization, oxygen was down to 74%, now using at home at 2 l/m would like portable oxygen tank for appts. Was ordered prior to D/C  8. Hypertensive heart disease with acute diastolic congestive heart failure (HCC)    Spent 25 minutes on phone for visit.   Patient was given opportunity to ask questions. Patient verbalized understanding of the plan and was able to repeat key elements of the plan. All questions were answered to their satisfaction.  Mark Brine, FNP   I, Mark Brine, FNP, have reviewed all documentation for this visit. The documentation on 09/08/22 for the exam, diagnosis, procedures, and orders are all accurate and complete.   IF YOU HAVE BEEN REFERRED TO A SPECIALIST, IT MAY TAKE 1-2 WEEKS TO SCHEDULE/PROCESS THE REFERRAL. IF YOU HAVE NOT HEARD FROM US/SPECIALIST IN TWO WEEKS, PLEASE GIVE [place] A CALL AT [phone] X [phone].   THE PATIENT IS ENCOURAGED TO PRACTICE SOCIAL DISTANCING DUE TO THE COVID-19 PANDEMIC.

## 2022-09-03 NOTE — Patient Instructions (Signed)

## 2022-09-07 ENCOUNTER — Ambulatory Visit: Payer: Self-pay

## 2022-09-07 NOTE — Patient Outreach (Signed)
  Care Coordination   Initial Visit Note   09/07/2022 Name: Mark [name] MRN: [id number] DOB: 1961-01-15  Mark [name] is a 62 y.o. year old male who sees [name], [place] for primary care. I spoke with  Mark [name] by phone today.  What matters to the patients health and wellness today?  Patient would like an order for a portable Oxygen tank. He would like to speak with a SW for counseling due to having stress and anxiety about declining health.      Goals Addressed               This Visit's Progress     Patient Stated     I need portable oxygen tanks (pt-stated)        Care Coordination Interventions: Evaluation of current treatment plan related to CHF and patient's adherence to plan as established by provider Determined patient is currently on continuous Oxygen Via Nasal Cannula at 4-5 liters/minute  Discussed the DME provider is [place], patient would like a portable Oxygen tank for traveling to MD appointments Reviewed with patient PCP referral for [facility], PT evaluation and Respiratory therapy for management of Oxygen needs  Determined patient did not receive a call from [place] for [place] outbound call to [place], determined the referral was not received for services Collaborated with PCP regarding the need for orders for the portable Oxygen sent to [place] via parachute to expedite, advised PCP the referral for other [facility] services is still pending Reply received from PCP advising she will collaborate with the referral coordinator to request the [facility] order be sent ASAP, she will send order for portable Oxygen Provided patient with verbal and written and educational materials to deep breathing exercises, "It's not just inspiration - careful breathing can help your heath"         To be less anxious about my health (pt-stated)        Care Coordination Interventions: Evaluation of current treatment plan related to anxiety and  patient's adherence to plan as established by provider Determined patient is having some anxiety related to having a decline in his health  Active listening / Reflection utilized  Emotional Support Provided Made referral to Christa See LCSW  via in basket message          SDOH assessments and interventions completed:  Yes  SDOH Interventions Today    Flowsheet Row Most Recent Value  SDOH Interventions   Transportation Interventions Intervention Not Indicated        Care Coordination Interventions:  Yes, provided   Follow up plan: Referral made to Christa See LCSW for counseling due to increase anxiety over declining health Follow up call scheduled for 09/18/22 @1 :00 PM    Encounter Outcome:  Pt. Visit Completed

## 2022-09-07 NOTE — Patient Instructions (Addendum)
Visit Information  Thank you for taking time to visit with me today. Please don't hesitate to contact me if I can be of assistance to you.   Following are the goals we discussed today:   Goals Addressed               This Visit's Progress     Patient Stated     I need portable oxygen tanks (pt-stated)        Care Coordination Interventions: Evaluation of current treatment plan related to CHF and patient's adherence to plan as established by provider Determined patient is currently on continuous Oxygen Via Nasal Cannula at 4-5 liters/minute  Discussed the DME provider is [place], patient would like a portable Oxygen tank for traveling to MD appointments Reviewed with patient PCP referral for [facility], PT evaluation and Respiratory therapy for management of Oxygen needs  Determined patient did not receive a call from [place] for [place] outbound call to [place], determined the referral was not received for services Collaborated with PCP regarding the need for orders for the portable Oxygen sent to [place] via parachute to expedite, advised PCP the referral for other [name] services is still pending Reply received from PCP advising she will collaborate with the referral coordinator to request the [facility] order be sent ASAP, she will send order for portable Oxygen Provided patient with verbal and written and educational materials to deep breathing exercises, "It's not just inspiration - careful breathing can help your heath"        To be less anxious about my health (pt-stated)        Care Coordination Interventions: Evaluation of current treatment plan related to anxiety and patient's adherence to plan as established by provider Determined patient is having some anxiety related to having a decline in his health  Active listening / Reflection utilized  Emotional Support Provided Made referral to Christa See LCSW via in basket message          Our next  appointment is by telephone on 09/18/22 at 1:00 PM  Please call the care guide team at [phone] if you need to cancel or reschedule your appointment.   If you are experiencing a Mental Health or [place] or need someone to talk to, please go to [facility] [place] [phone])  The patient verbalized understanding of instructions, educational materials, and care plan provided today and agreed to receive a mailed copy of patient instructions, educational materials, and care plan.   Barb Merino, RN, BSN, CCM Care Management Coordinator [name] Care Management Direct Phone: [phone]

## 2022-09-08 ENCOUNTER — Encounter (HOSPITAL_COMMUNITY): Payer: Medicare HMO

## 2022-09-08 ENCOUNTER — Telehealth (HOSPITAL_COMMUNITY): Payer: Self-pay | Admitting: *Deleted

## 2022-09-08 ENCOUNTER — Ambulatory Visit: Payer: Self-pay

## 2022-09-08 NOTE — Progress Notes (Incomplete)
HEART & VASCULAR TRANSITION OF CARE CONSULT NOTE     Referring Physician: Primary Care: Primary Cardiologist:  HPI: Referred to clinic by *** for heart failure consultation.   Cardiac Testing    Review of Systems: [y] = yes, [ ]  = no   General: Weight gain [ ] ; Weight loss [ ] ; Anorexia [ ] ; Fatigue [ ] ; Fever [ ] ; Chills [ ] ; Weakness [ ]   Cardiac: Chest pain/pressure [ ] ; Resting SOB [ ] ; Exertional SOB [ ] ; Orthopnea [ ] ; Pedal Edema [ ] ; Palpitations [ ] ; Syncope [ ] ; Presyncope [ ] ; Paroxysmal nocturnal dyspnea[ ]   Pulmonary: Cough [ ] ; Wheezing[ ] ; Hemoptysis[ ] ; Sputum [ ] ; Snoring [ ]   GI: Vomiting[ ] ; Dysphagia[ ] ; Melena[ ] ; Hematochezia [ ] ; Heartburn[ ] ; Abdominal pain [ ] ; Constipation [ ] ; Diarrhea [ ] ; BRBPR [ ]   GU: Hematuria[ ] ; Dysuria [ ] ; Nocturia[ ]   Vascular: Pain in legs with walking [ ] ; Pain in feet with lying flat [ ] ; Non-healing sores [ ] ; Stroke [ ] ; TIA [ ] ; Slurred speech [ ] ;  Neuro: Headaches[ ] ; Vertigo[ ] ; Seizures[ ] ; Paresthesias[ ] ;Blurred vision [ ] ; Diplopia [ ] ; Vision changes [ ]   Ortho/Skin: Arthritis [ ] ; Joint pain [ ] ; Muscle pain [ ] ; Joint swelling [ ] ; Back Pain [ ] ; Rash [ ]   Psych: Depression[ ] ; Anxiety[ ]   Heme: Bleeding problems [ ] ; Clotting disorders [ ] ; Anemia [ ]   Endocrine: Diabetes [ ] ; Thyroid dysfunction[ ]    Past Medical History:  Diagnosis Date   Abnormal lung function test    a. Reported to possibly be COPD, but per pulm note: "Possible COPD - PFT was more suggestive of restrictive defect and diffusion defect likely from obesity and CHF"   Chronic diastolic heart failure ([place]) 05/07/2018   CKD (chronic kidney disease), stage III ([place])    Diabetes mellitus type 2 in obese [facility])    Elevated troponin 05/07/2018   Hearing impaired    Hyperlipidemia    Hypertension    LBBB (left bundle branch block)    LVH (left ventricular hypertrophy)    Mild aortic stenosis    Mild pulmonary hypertension (HCC)    Morbid  obesity (HCC)    Sleep apnea     Current Outpatient Medications  Medication Sig Dispense Refill   atorvastatin (LIPITOR) 20 MG tablet TAKE 1 TABLET BY MOUTH EVERY DAY (Patient taking differently: Take 20 mg by mouth daily.) 90 tablet 0   carvedilol (COREG) 6.25 MG tablet TAKE 1 TABLET BY MOUTH TWICE A DAY WITH FOOD (Patient taking differently: Take 6.25 mg by mouth 2 (two) times daily with a meal.) 180 tablet 1   empagliflozin (JARDIANCE) 10 MG TABS tablet Take 1 tablet (10 mg total) by mouth daily. 30 tablet 0   losartan (COZAAR) 100 MG tablet Take 1 tablet (100 mg total) by mouth daily. 30 tablet 1   metFORMIN (GLUCOPHAGE) 1000 MG tablet TAKE 1 TABLET (1,000 MG TOTAL) BY MOUTH TWICE A DAY WITH FOOD (Patient taking differently: Take 1,000 mg by mouth 2 (two) times daily with a meal.) 180 tablet 1   torsemide (DEMADEX) 20 MG tablet TAKE 2 TABLETS (40 MG TOTAL) BY MOUTH DAILY. 180 tablet 1   No current facility-administered medications for this visit.    No Known Allergies    Social History   Socioeconomic History   Marital status: Divorced    Spouse name: Not on file   Number of  children: Not on file   Years of education: Not on file   Highest education level: Not on file  Occupational History   Occupation: employed  Tobacco Use   Smoking status: Former    Years: 1.00    Types: Cigarettes    Quit date: 1999    Years since quitting: 25.0   Smokeless tobacco: Never   Tobacco comments:    1 pack would last one month--08/01/18  Vaping Use   Vaping Use: Never used  Substance and Sexual Activity   Alcohol use: No   Drug use: No   Sexual activity: Not Currently  Other Topics Concern   Not on file  Social History Narrative   Not on file   Social Determinants of Health   Financial Resource Strain: Low Risk  (04/08/2022)   Overall Financial Resource Strain (CARDIA)    Difficulty of Paying Living Expenses: Not hard at all  Food Insecurity: No Food Insecurity (08/27/2022)    Hunger Vital Sign    Worried About Running Out of Food in the Last Year: Never true    Ran Out of Food in the Last Year: Never true  Transportation Needs: No Transportation Needs (09/07/2022)   PRAPARE - Administrator, Civil Service (Medical): No    Lack of Transportation (Non-Medical): No  Physical Activity: Inactive (04/08/2022)   Exercise Vital Sign    Days of Exercise per Week: 0 days    Minutes of Exercise per Session: 0 min  Stress: No Stress Concern Present (04/08/2022)   [employer] of Occupational Health - Occupational Stress Questionnaire    Feeling of Stress : Not at all  Social Connections: Not on file  Intimate Partner Violence: Not At Risk (08/21/2022)   Humiliation, Afraid, Rape, and Kick questionnaire    Fear of Current or Ex-Partner: No    Emotionally Abused: No    Physically Abused: No    Sexually Abused: No      Family History  Problem Relation Age of Onset   Hypertension Mother    Hypertension Father     There were no vitals filed for this visit.  PHYSICAL EXAM: General:  Well appearing. No respiratory difficulty HEENT: normal Neck: supple. no JVD. Carotids 2+ bilat; no bruits. No lymphadenopathy or thryomegaly appreciated. Cor: PMI nondisplaced. Regular rate & rhythm. No rubs, gallops or murmurs. Lungs: clear Abdomen: soft, nontender, nondistended. No hepatosplenomegaly. No bruits or masses. Good bowel sounds. Extremities: no cyanosis, clubbing, rash, edema Neuro: alert & oriented x 3, cranial nerves grossly intact. moves all 4 extremities w/o difficulty. Affect pleasant.  ECG:   ASSESSMENT & PLAN:  NYHA *** GDMT  Diuretic- BB- Ace/ARB/ARNI MRA SGLT2i    Referred to HFSW (PCP, Medications, Transportation, ETOH Abuse, Drug Abuse, Insurance, Financial ): Yes or No Refer to Pharmacy: Yes or No Refer to Home Health: Yes on No Refer to Advanced Heart Failure Clinic: Yes or no  Refer to General Cardiology: Yes or No  Follow up

## 2022-09-08 NOTE — Patient Instructions (Signed)
Visit Information  Thank you for taking time to visit with me today. Please don't hesitate to contact me if I can be of assistance to you.   Following are the goals we discussed today:   Goals Addressed               This Visit's Progress     Patient Stated     I need portable oxygen tanks (pt-stated)        Care Coordination Interventions: Received voice message from patient requesting a return call to advise that his portable oxygen will arrive in time for his scheduled appt this afternoon Collaborated with PCP regarding order status for portable tank, confirmed PCP sent order on 09/07/22 via [place] to [place] successful outbound call to patient Determined patient cancelled his Cardiology follow up scheduled for today at 2 pm due to not having his portable oxygen, patient is asking if okay to drive himself without oxygen Advised patient he should not drive himself to appointment without his oxygen, discussed patient rescheduled his Cardiology appt for this afternoon at 3 pm Placed successful outbound call to [place] regarding order for portable oxygen Determined patient has a compression oxygen concentrator and should be able to self refill his portable oxygen tank Determined patient was not instructed on how to refill his tank Spoke with Alice from [employer] who confirmed a representative will make a home visit to patient's home ASAP to instruct patient on how to refill his tank, patient will receive a call from rep to schedule a time, this will take place today in order to assist patient prior to scheduled appt, D[place] Clap will call this RN to advise once completed Informed patient of plan for rep from [place] to contact him by phone to schedule a home visit and advised this should take place in time for patient to have his refill in time to travel to his Cardiology appt scheduled for this afternoon, patient verbalizes understanding          Our next  appointment is by telephone on 09/18/22 at 1PM  Please call the care guide team at [phone] if you need to cancel or reschedule your appointment.   If you are experiencing a Mental Health or [place] or need someone to talk to, please go to [facility] [place] [phone])  The patient verbalized understanding of instructions, educational materials, and care plan provided today and agreed to receive a mailed copy of patient instructions, educational materials, and care plan.   Barb Merino, RN, BSN, CCM Care Management Coordinator [facility] Care Management Direct Phone: [phone]

## 2022-09-08 NOTE — Telephone Encounter (Signed)
Called patient to clarify why he cancelled appointment and then re-scheduled for later today. He says he spoke with a hospital nurse who told him to come and we can help him get portable O2 tank. Informed him we do not have supply of portable tanks to provide for him. He reports "no one has come to fill my tanks and I don't know who to call". Had patient check for contact # on his home O2 tank. He confirmed there is a phone # to call. He will call and speak with [place] about how to refill his portable O2 tank.  Pt reports he feels fine and denies any issues at this time. He will return to [facility] appointment on 09/21/22 at 11:00 am as re-scheduled this am per his request.

## 2022-09-08 NOTE — Patient Outreach (Signed)
  Care Coordination   Follow Up Visit Note   09/08/2022 Name: Mark [name] MRN: [id number] DOB: 12-May-1961  Mark [name] is a 62 y.o. year old male who sees [name], [place] for primary care. I spoke with  Mark [name] by phone today.  What matters to the patients health and wellness today?  Patient needs a portable oxygen tank to travel with to his MD Cardiology appointment scheduled for today.     Goals Addressed               This Visit's Progress     Patient Stated     I need portable oxygen tanks (pt-stated)        Care Coordination Interventions: Received voice message from patient requesting a return call to advise that his portable oxygen will arrive in time for his scheduled appt this afternoon Collaborated with PCP regarding order status for portable tank, confirmed PCP sent order on 09/07/22 via [place] to [place] successful outbound call to patient Determined patient cancelled his Cardiology follow up scheduled for today at 2 pm due to not having his portable oxygen, patient is asking if okay to drive himself without oxygen Advised patient he should not drive himself to appointment without his oxygen, discussed patient rescheduled his Cardiology appt for this afternoon at 3 pm Placed successful outbound call to [place] regarding order for portable oxygen Determined patient has a compression oxygen concentrator and should be able to self refill his portable oxygen tank Determined patient was not instructed on how to refill his tank Spoke with Alice from [employer] who confirmed a representative will make a home visit to patient's home ASAP to instruct patient on how to refill his tank, patient will receive a call from rep to schedule a time, this will take place today in order to assist patient prior to scheduled appt, Alice will call this RN to advise once completed Informed patient of plan for rep from [place] to contact him by phone  to schedule a home visit and advised this should take place in time for patient to have his refill in time to travel to his Cardiology appt scheduled for this afternoon, patient verbalizes understanding          SDOH assessments and interventions completed:  No     Care Coordination Interventions:  Yes, provided   Follow up plan: Follow up call scheduled for 09/18/22 @1PM     Encounter Outcome:  Pt. Visit Completed

## 2022-09-08 NOTE — Telephone Encounter (Signed)
Heart Failure Nurse Navigator Progress Note   Patient rescheduled his Hf TOC appointment till 09/21/22 due to he hasn't received his portable O2 tank yet, only has his home tank.   Earnestine Leys, BSN, Clinical cytogeneticist Only

## 2022-09-08 NOTE — Telephone Encounter (Signed)
Heart Failure Nurse Navigator Progress Note   Patient re scheduled his HF TOC appt for 09/21/22, due to he hasn't received his portable O2 tank yet and currently only has his home oxygen tank.   Earnestine Leys, BSN, Clinical cytogeneticist Only

## 2022-09-09 ENCOUNTER — Encounter: Payer: Self-pay | Admitting: Internal Medicine

## 2022-09-09 ENCOUNTER — Ambulatory Visit (INDEPENDENT_AMBULATORY_CARE_PROVIDER_SITE_OTHER): Payer: Medicare HMO | Admitting: Pharmacist

## 2022-09-09 ENCOUNTER — Ambulatory Visit (INDEPENDENT_AMBULATORY_CARE_PROVIDER_SITE_OTHER): Payer: Medicare HMO | Admitting: Internal Medicine

## 2022-09-09 ENCOUNTER — Other Ambulatory Visit (HOSPITAL_COMMUNITY)
Admission: RE | Admit: 2022-09-09 | Discharge: 2022-09-09 | Disposition: A | Discharge status: Home / Self Care | Payer: Medicare HMO | Source: Ambulatory Visit | Attending: Internal Medicine | Admitting: Internal Medicine

## 2022-09-09 ENCOUNTER — Other Ambulatory Visit: Payer: Self-pay

## 2022-09-09 VITALS — BP 136/82 | HR 76 | Temp 99.3°F | Wt 252.6 lb

## 2022-09-09 DIAGNOSIS — B20 Human immunodeficiency virus [HIV] disease: Secondary | ICD-10-CM | POA: Diagnosis not present

## 2022-09-09 DIAGNOSIS — Z79899 Other long term (current) drug therapy: Secondary | ICD-10-CM | POA: Diagnosis not present

## 2022-09-09 MED ORDER — BICTEGRAVIR-EMTRICITAB-TENOFOV 50-200-25 MG PO TABS: 1.0000 | ORAL_TABLET | Freq: Every day | ORAL | 2 refills | Status: DC

## 2022-09-09 NOTE — Progress Notes (Signed)
[facility] for Infectious Disease    HPI: Mark [name] is a 62 y.o. male recently Dx HIV. He was hospitalized in December for HF exacerbation found to e HIV+ 12/26 cd4 574, VL 15.1K. Pt reports he was sexually active about four years ago. With ex wife. Reports that wife was tested and states it was negative.  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  Date of diagnosis 12/23 ART exposure none Past OIs Risk factors: heterosexual Social: Occupation: Art gallery manager Housing: apt by himself Support: has family, none know about HIV diagnosis Understanding of HIV: poor Etoh/drug/tobacco use: no/no/no  Past Medical History:  Diagnosis Date   Abnormal lung function test    a. Reported to possibly be COPD, but per pulm note: "Possible COPD - PFT was more suggestive of restrictive defect and diffusion defect likely from obesity and CHF"   Chronic diastolic heart failure ([place]) 05/07/2018   CKD (chronic kidney disease), stage III (HCC)    Diabetes mellitus type 2 in obese [facility])    Elevated troponin 05/07/2018   Hearing impaired    Hyperlipidemia    Hypertension    LBBB (left bundle branch block)    LVH (left ventricular hypertrophy)    Mild aortic stenosis    Mild pulmonary hypertension (HCC)    Morbid obesity (HCC)    Sleep apnea     Past Surgical History:  Procedure Laterality Date   BACK SURGERY     TRACHEOSTOMY      Family History  Problem Relation Age of Onset   Hypertension Mother    Hypertension Father    Current Outpatient Medications on File Prior to Visit  Medication Sig Dispense Refill   atorvastatin (LIPITOR) 20 MG tablet TAKE 1 TABLET BY MOUTH EVERY DAY (Patient taking differently: Take 20 mg by mouth daily.) 90 tablet 0   carvedilol (COREG) 6.25 MG tablet TAKE 1 TABLET BY MOUTH TWICE A DAY WITH FOOD (Patient taking differently: Take 6.25 mg by mouth 2 (two) times daily with a meal.) 180 tablet 1   empagliflozin (JARDIANCE) 10 MG TABS tablet  Take 1 tablet (10 mg total) by mouth daily. 30 tablet 0   losartan (COZAAR) 100 MG tablet Take 1 tablet (100 mg total) by mouth daily. 30 tablet 1   metFORMIN (GLUCOPHAGE) 1000 MG tablet TAKE 1 TABLET (1,000 MG TOTAL) BY MOUTH TWICE A DAY WITH FOOD (Patient taking differently: Take 1,000 mg by mouth 2 (two) times daily with a meal.) 180 tablet 1   torsemide (DEMADEX) 20 MG tablet TAKE 2 TABLETS (40 MG TOTAL) BY MOUTH DAILY. 180 tablet 1   No current facility-administered medications on file prior to visit.    No Known Allergies  Lab Results HIV 1 RNA Quant (copies/mL)  Date Value  08/25/2022 15,100   CD4 T Cell Abs (/uL)  Date Value  08/25/2022 574   No results found for: "HIV1GENOSEQ" Lab Results  Component Value Date   WBC 4.2 08/26/2022   HGB 14.7 08/26/2022   HCT 51.1 08/26/2022   MCV 99.0 08/26/2022   PLT 132 (L) 08/26/2022    Lab Results  Component Value Date   CREATININE 1.38 (H) 08/26/2022   BUN 30 (H) 08/26/2022   NA 143 08/26/2022   K 4.7 08/26/2022   CL 103 08/26/2022   CO2 33 (H) 08/26/2022   Lab Results  Component Value Date   ALT 25 08/22/2022  AST 18 08/22/2022   ALKPHOS 87 08/22/2022   BILITOT 1.2 08/22/2022    Lab Results  Component Value Date   CHOL 160 01/05/2022   TRIG 99 01/05/2022   HDL 56 01/05/2022   LDLCALC 86 01/05/2022   Lab Results  Component Value Date   HAV Reactive (A) 08/25/2022   Lab Results  Component Value Date   HEPBSAG NON REACTIVE 08/25/2022   No results found for: "HCVAB" No results found for: "CHLAMYDIAWP", "N" No results found for: "GCPROBEAPT" No results found for: "QUANTGOLD"  Plan #HIV, newly Dx(12/23) not on ART -HIV NR on 05/07/2018, 12/24/2 HIV+ 15.1 K VL, cd4 574 -Reache out to research for possibly started injectables, pt does not qualify due to underlying cardiovascular issues.  -Given biktarvy samples, Rx biktarvy. Pt spoke with pharmacy -Dental paperwork today -HIV labs, F/U in 2 weeks  #Left  hand swelling -Referred to PCP, pt states he is having a gout episode  #Vaccination COVID bososter 04/13/22 Flu 04/13/22 Monkeypox PCV 20 04/13/22 Meningitis-next visit HepA-immune  HEpB(sab nr, sag nr) caborder today 09/09/21 Tdap unk Shingles needs  #Health maintenance -Quantiferon order today 09/09/21 -RPR NR 08/26/22 -HCV NR 08/23/22 -Colonoscopy-refer to GI for colnoscopy(referred in the past)   I have personally spent 83 minutes involved in face-to-face and non-face-to-face activities for this patient on the day of the visit. Professional time spent includes the following activities: Preparing to see the patient (review of tests), Obtaining and/or reviewing separately obtained history (admission/discharge record), Performing a medically appropriate examination and/or evaluation , Ordering medications/tests/procedures, referring and communicating with other health care professionals, Documenting clinical information in the EMR, Independently interpreting results (not separately reported), Communicating results to the patient/family/caregiver, Counseling and educating the patient/family/caregiver and Care coordination (not separately reported).   Laurice Record, [place] for Infectious Disease [name]

## 2022-09-09 NOTE — Addendum Note (Signed)
Addended by: Caffie Pinto on: 09/09/2022 11:54 AM   Modules accepted: Orders

## 2022-09-10 ENCOUNTER — Other Ambulatory Visit: Payer: Self-pay | Admitting: Pharmacist

## 2022-09-10 DIAGNOSIS — E1122 Type 2 diabetes mellitus with diabetic chronic kidney disease: Secondary | ICD-10-CM | POA: Diagnosis not present

## 2022-09-10 DIAGNOSIS — M1030 Gout due to renal impairment, unspecified site: Secondary | ICD-10-CM | POA: Diagnosis not present

## 2022-09-10 DIAGNOSIS — I350 Nonrheumatic aortic (valve) stenosis: Secondary | ICD-10-CM | POA: Diagnosis not present

## 2022-09-10 DIAGNOSIS — G4733 Obstructive sleep apnea (adult) (pediatric): Secondary | ICD-10-CM | POA: Diagnosis not present

## 2022-09-10 DIAGNOSIS — I272 Pulmonary hypertension, unspecified: Secondary | ICD-10-CM | POA: Diagnosis not present

## 2022-09-10 DIAGNOSIS — I447 Left bundle-branch block, unspecified: Secondary | ICD-10-CM | POA: Diagnosis not present

## 2022-09-10 DIAGNOSIS — I130 Hypertensive heart and chronic kidney disease with heart failure and stage 1 through stage 4 chronic kidney disease, or unspecified chronic kidney disease: Secondary | ICD-10-CM | POA: Diagnosis not present

## 2022-09-10 DIAGNOSIS — B20 Human immunodeficiency virus [HIV] disease: Secondary | ICD-10-CM

## 2022-09-10 DIAGNOSIS — N183 Chronic kidney disease, stage 3 unspecified: Secondary | ICD-10-CM | POA: Diagnosis not present

## 2022-09-10 DIAGNOSIS — I5033 Acute on chronic diastolic (congestive) heart failure: Secondary | ICD-10-CM | POA: Diagnosis not present

## 2022-09-10 LAB — T-HELPER CELL (CD4) - (RCID CLINIC ONLY)
CD4 % Helper T Cell: 36 % (ref 33–65)
CD4 T Cell Abs: 334 /uL — ABNORMAL LOW (ref 400–1790)

## 2022-09-10 LAB — CYTOLOGY, (ORAL, ANAL, URETHRAL) ANCILLARY ONLY
Chlamydia: NEGATIVE
Comment: NEGATIVE
Comment: NORMAL
Neisseria Gonorrhea: NEGATIVE

## 2022-09-10 MED ORDER — BICTEGRAVIR-EMTRICITAB-TENOFOV 50-200-25 MG PO TABS: 1.0000 | ORAL_TABLET | Freq: Every day | ORAL | 0 refills | Status: DC

## 2022-09-10 NOTE — Progress Notes (Signed)
09/10/2022  HPI: Mark [name] is a 62 y.o. male who presents to the [facility] clinic today to initiate care for a newly diagnosed HIV infection.  Patient Active Problem List   Diagnosis Date Noted   Acute on chronic diastolic (congestive) heart failure ([place]) 08/21/2022   OSA (obstructive sleep apnea) 08/21/2022   Thyroid nodule 01/15/2020   PAD (peripheral artery disease) ([place]) 05/28/2019   DM2 (diabetes mellitus, type 2) ([place]) 09/26/2018   Obesity (BMI 30-39.9) 09/26/2018   Chronic diastolic heart failure ([place]) 05/07/2018   CKD (chronic kidney disease), stage III ([place]) 05/07/2018   Cardiomegaly    Encntr for general adult medical exam w/o abnormal findings 04/25/2018   Hearing loss 04/25/2018   LBBB (left bundle branch block) 04/25/2018   Hyperlipidemia 04/25/2018   Other long term (current) drug therapy 04/25/2018   Other specified disorders of pigmentation 04/25/2018   Personal history of noncompliance with medical treatment, presenting hazards to health 04/25/2018   Sensorineural hearing loss (SNHL) of left ear 06/14/2017   Essential hypertension 06/05/2013   Pure hypercholesterolemia 06/05/2013   Vitamin D deficiency 06/05/2013   Corns and callosity 11/03/2010    Patient's Medications  New Prescriptions   No medications on file  Previous Medications   ATORVASTATIN (LIPITOR) 20 MG TABLET    TAKE 1 TABLET BY MOUTH EVERY DAY   BICTEGRAVIR-EMTRICITABINE-TENOFOVIR AF (BIKTARVY) 50-200-25 MG TABS TABLET    Take 1 tablet by mouth daily. Try to take at the same time each day with or without food.   CARVEDILOL (COREG) 6.25 MG TABLET    TAKE 1 TABLET BY MOUTH TWICE A DAY WITH FOOD   EMPAGLIFLOZIN (JARDIANCE) 10 MG TABS TABLET    Take 1 tablet (10 mg total) by mouth daily.   LOSARTAN (COZAAR) 100 MG TABLET    Take 1 tablet (100 mg total) by mouth daily.   METFORMIN (GLUCOPHAGE) 1000 MG TABLET    TAKE 1 TABLET (1,000 MG TOTAL) BY MOUTH TWICE A DAY WITH FOOD   TORSEMIDE (DEMADEX) 20  MG TABLET    TAKE 2 TABLETS (40 MG TOTAL) BY MOUTH DAILY.  Modified Medications   No medications on file  Discontinued Medications   No medications on file    Allergies: No Known Allergies  Past Medical History: Past Medical History:  Diagnosis Date   Abnormal lung function test    a. Reported to possibly be COPD, but per pulm note: "Possible COPD - PFT was more suggestive of restrictive defect and diffusion defect likely from obesity and CHF"   Chronic diastolic heart failure ([place]) 05/07/2018   CKD (chronic kidney disease), stage III (H[facility])    Diabetes mellitus type 2 in obese (H[facility])    Elevated troponin 05/07/2018   Hearing impaired    Hyperlipidemia    Hypertension    LBBB (left bundle branch block)    LVH (left ventricular hypertrophy)    Mild aortic stenosis    Mild pulmonary hypertension (HCC)    Morbid obesity (HCC)    Sleep apnea     Social History: Social History   Socioeconomic History   Marital status: Divorced    Spouse name: Not on file   Number of children: Not on file   Years of education: Not on file   Highest education level: Not on file  Occupational History   Occupation: employed  Tobacco Use   Smoking status: Former    Years: 1.00    Types: Cigarettes    Quit date:  1999    Years since quitting: 25.0   Smokeless tobacco: Never   Tobacco comments:    1 pack would last one month--08/01/18  Vaping Use   Vaping Use: Never used  Substance and Sexual Activity   Alcohol use: No   Drug use: No   Sexual activity: Not Currently  Other Topics Concern   Not on file  Social History Narrative   Not on file   Social Determinants of Health   Financial Resource Strain: Low Risk  (04/08/2022)   Overall Financial Resource Strain (CARDIA)    Difficulty of Paying Living Expenses: Not hard at all  Food Insecurity: No Food Insecurity (08/27/2022)   Hunger Vital Sign    Worried About Running Out of Food in the Last Year: Never true    Ran Out of Food in the Last  Year: Never true  Transportation Needs: No Transportation Needs (09/07/2022)   PRAPARE - Administrator, Civil Service (Medical): No    Lack of Transportation (Non-Medical): No  Physical Activity: Inactive (04/08/2022)   Exercise Vital Sign    Days of Exercise per Week: 0 days    Minutes of Exercise per Session: 0 min  Stress: No Stress Concern Present (04/08/2022)   [employer] of Occupational Health - Occupational Stress Questionnaire    Feeling of Stress : Not at all  Social Connections: Not on file    Labs: Lab Results  Component Value Date   HIV1RNAQUANT 15,100 08/25/2022   CD4TABS 334 (L) 09/09/2022   CD4TABS 574 08/25/2022    RPR and STI Lab Results  Component Value Date   LABRPR NON REACTIVE 08/26/2022        No data to display          Hepatitis B Lab Results  Component Value Date   HEPBSAG NON REACTIVE 08/25/2022   HEPBCAB NON-REACTIVE 09/09/2022   Hepatitis C No results found for: "HEPCAB", "HCVRNAPCRQN" Hepatitis A Lab Results  Component Value Date   HAV Reactive (A) 08/25/2022   Lipids: Lab Results  Component Value Date   CHOL 184 09/09/2022   TRIG 162 (H) 09/09/2022   HDL 46 09/09/2022   CHOLHDL 4.0 09/09/2022   VLDL 8 05/08/2018   LDLCALC 110 (H) 09/09/2022    Current HIV Regimen: Treatment naive  Assessment: Kolbe is here today to initiate care with Dr. Thedore Mins for his newly diagnosed HIV infection.  He is treatment naive with an initial HIV viral load of 15,100 and a CD4 count of 334. Genotype pending. Will start patient on Biktarvy.  Explained that Susanne Borders is a one pill once daily medication with or without food and the importance of not missing any doses. Explained resistance and how it develops and why it is so important to take Biktarvy daily and not skip days or doses. Counseled to take it around the same time each day. Counseled on what to do if dose is missed, if closer to missed dose take immediately, if closer to  next dose then skip and resume normal schedule.   Cautioned on possible side effects the first week or so including nausea, diarrhea, dizziness, and headaches but that they should resolve after the first couple of weeks. I reviewed medications and found no drug interactions. Counseled to separate Biktarvy from divalent cations including multivitamins. Discussed to call clinic or send a MyChart message if he starts a new medication or herbal supplement. Answered all questions.   Plan: - Start Biktarvy  Cierra Rothgeb L.  Cutter Passey, PharmD, BCIDP, AAHIVP, CPP Clinical Pharmacist Practitioner Infectious Diseases [facility] 09/10/2022, 11:56 AM

## 2022-09-10 NOTE — Progress Notes (Signed)
Medication Samples have been provided to the patient.  Drug name: Biktarvy        Strength: 50/200/25 mg       Qty: 14 tablets (2 bottles) LOT: CPBDCA   Exp.Date: 3/26  Dosing instructions: Take one tablet by mouth once daily  The patient has been instructed regarding the correct time, dose, and frequency of taking this medication, including desired effects and most common side effects.   Bela Bonaparte, PharmD, CPP, BCIDP, AAHIVP Clinical Pharmacist Practitioner Infectious Diseases Clinical Pharmacist [facility]  

## 2022-09-11 ENCOUNTER — Other Ambulatory Visit (HOSPITAL_COMMUNITY): Payer: Self-pay

## 2022-09-11 ENCOUNTER — Telehealth: Payer: Self-pay

## 2022-09-11 NOTE — Telephone Encounter (Signed)
[facility] Patient Advocate Encounter   I was successful in securing patient a $ 10,000.00 grant from [employer] to provide copayment coverage for Biktarvy.  The patient's out of pocket cost will be $0.00 monthly.     I have spoken with the patient.          Dates of Eligibility: 09/11/22 through 08/31/23  Patient knows to call the office with questions or concerns.  Ileene Patrick, [facility] Patient [facility] Phone: [phone] Fax:  [phone]

## 2022-09-11 NOTE — Telephone Encounter (Signed)
Patient called, [employer] copay is too expensive. He's wondering if he can get copay assistance.   Beryle Flock, RN

## 2022-09-12 ENCOUNTER — Emergency Department (HOSPITAL_COMMUNITY): Payer: Medicare HMO

## 2022-09-12 ENCOUNTER — Emergency Department (HOSPITAL_COMMUNITY)
Admission: EM | Admit: 2022-09-12 | Discharge: 2022-09-12 | Disposition: A | Discharge status: Home / Self Care | Payer: Medicare HMO | Attending: Student | Admitting: Student

## 2022-09-12 ENCOUNTER — Encounter (HOSPITAL_COMMUNITY): Payer: Self-pay | Admitting: *Deleted

## 2022-09-12 ENCOUNTER — Other Ambulatory Visit: Payer: Self-pay

## 2022-09-12 DIAGNOSIS — I5032 Chronic diastolic (congestive) heart failure: Secondary | ICD-10-CM | POA: Diagnosis not present

## 2022-09-12 DIAGNOSIS — M109 Gout, unspecified: Secondary | ICD-10-CM | POA: Insufficient documentation

## 2022-09-12 DIAGNOSIS — I130 Hypertensive heart and chronic kidney disease with heart failure and stage 1 through stage 4 chronic kidney disease, or unspecified chronic kidney disease: Secondary | ICD-10-CM | POA: Diagnosis not present

## 2022-09-12 DIAGNOSIS — M25532 Pain in left wrist: Secondary | ICD-10-CM | POA: Diagnosis not present

## 2022-09-12 DIAGNOSIS — G4733 Obstructive sleep apnea (adult) (pediatric): Secondary | ICD-10-CM | POA: Diagnosis not present

## 2022-09-12 DIAGNOSIS — N183 Chronic kidney disease, stage 3 unspecified: Secondary | ICD-10-CM | POA: Diagnosis not present

## 2022-09-12 DIAGNOSIS — E1122 Type 2 diabetes mellitus with diabetic chronic kidney disease: Secondary | ICD-10-CM | POA: Insufficient documentation

## 2022-09-12 DIAGNOSIS — M7989 Other specified soft tissue disorders: Secondary | ICD-10-CM | POA: Diagnosis not present

## 2022-09-12 DIAGNOSIS — M10042 Idiopathic gout, left hand: Secondary | ICD-10-CM | POA: Diagnosis not present

## 2022-09-12 DIAGNOSIS — M79642 Pain in left hand: Secondary | ICD-10-CM | POA: Diagnosis present

## 2022-09-12 LAB — CBC WITH DIFFERENTIAL/PLATELET
Abs Immature Granulocytes: 0.02 10*3/uL (ref 0.00–0.07)
Basophils Absolute: 0 10*3/uL (ref 0.0–0.1)
Basophils Relative: 1 %
Eosinophils Absolute: 0.1 10*3/uL (ref 0.0–0.5)
Eosinophils Relative: 2 %
HCT: 46.3 % (ref 39.0–52.0)
Hemoglobin: 14.2 g/dL (ref 13.0–17.0)
Immature Granulocytes: 0 %
Lymphocytes Relative: 25 %
Lymphs Abs: 1.2 10*3/uL (ref 0.7–4.0)
MCH: 28.9 pg (ref 26.0–34.0)
MCHC: 30.7 g/dL (ref 30.0–36.0)
MCV: 94.1 fL (ref 80.0–100.0)
Monocytes Absolute: 0.6 10*3/uL (ref 0.1–1.0)
Monocytes Relative: 14 %
Neutro Abs: 2.8 10*3/uL (ref 1.7–7.7)
Neutrophils Relative %: 58 %
Platelets: 208 10*3/uL (ref 150–400)
RBC: 4.92 MIL/uL (ref 4.22–5.81)
RDW: 14.1 % (ref 11.5–15.5)
WBC: 4.8 10*3/uL (ref 4.0–10.5)
nRBC: 0 % (ref 0.0–0.2)

## 2022-09-12 LAB — BASIC METABOLIC PANEL
Anion gap: 13 (ref 5–15)
BUN: 39 mg/dL — ABNORMAL HIGH (ref 8–23)
CO2: 29 mmol/L (ref 22–32)
Calcium: 9.5 mg/dL (ref 8.9–10.3)
Chloride: 99 mmol/L (ref 98–111)
Creatinine, Ser: 1.95 mg/dL — ABNORMAL HIGH (ref 0.61–1.24)
GFR, Estimated: 38 mL/min — ABNORMAL LOW (ref >60)
Glucose, Bld: 115 mg/dL — ABNORMAL HIGH (ref 70–99)
Potassium: 4.1 mmol/L (ref 3.5–5.1)
Sodium: 141 mmol/L (ref 135–145)

## 2022-09-12 MED ORDER — METHYLPREDNISOLONE 4 MG PO TBPK: ORAL_TABLET | ORAL | 0 refills | Status: DC

## 2022-09-12 MED ORDER — COLCHICINE 0.6 MG PO TABS: 0.6000 mg | ORAL_TABLET | Freq: Every day | ORAL | 0 refills | Status: DC

## 2022-09-12 MED ORDER — COLCHICINE 0.6 MG PO TABS
1.2000 mg | ORAL_TABLET | Freq: Once | ORAL | Status: AC
  Administered 2022-09-12: 1.2 mg via ORAL
  Filled 2022-09-12: qty 2

## 2022-09-12 MED ORDER — DEXAMETHASONE SODIUM PHOSPHATE 10 MG/ML IJ SOLN
8.0000 mg | Freq: Once | INTRAMUSCULAR | Status: AC
  Administered 2022-09-12: 8 mg via INTRAMUSCULAR
  Filled 2022-09-12: qty 1

## 2022-09-12 MED ORDER — HYDROCODONE-ACETAMINOPHEN 5-325 MG PO TABS: 2.0000 | ORAL_TABLET | Freq: Four times a day (QID) | ORAL | 0 refills | Status: DC | PRN

## 2022-09-12 NOTE — ED Provider Triage Note (Signed)
  Emergency Medicine Provider Triage Evaluation Note  MRN:  [id number]  Arrival date & time: 09/12/22    Medically screening exam initiated at 2:53 AM.   CC:   Hand Problem   HPI:  Mark [name] is a 62 y.o. year-old male presents to the ED with chief complaint of left hand pain and swelling for the past couple of weeks. Denies injury.  Denies hx of gout.  History provided by patient. ROS:  -As included in HPI PE:   Vitals:   09/12/22 0252  BP: (!) 125/90  Pulse: 73  Resp: 17  Temp: 98.7 F (37.1 C)  SpO2: 95%    Non-toxic appearing No respiratory distress Swelling to left hand and fingers MDM:  Based on signs and symptoms, gout is highest on my differential, followed by infection. I've ordered labs and imaging in triage to expedite lab/diagnostic workup.  Patient was informed that the remainder of the evaluation will be completed by another provider, this initial triage assessment does not replace that evaluation, and the importance of remaining in the ED until their evaluation is complete.    Montine Circle, PA-C 09/12/22 [phone]

## 2022-09-12 NOTE — ED Triage Notes (Signed)
The pt  has had a swollen lt hand for approx 2 weeeks it is getting no better

## 2022-09-12 NOTE — ED Provider Notes (Signed)
[facility] EMERGENCY DEPARTMENT Provider Note  CSN: [id number] Arrival date & time: 09/12/22 0222  Chief Complaint(s) Hand Problem  HPI DESIREE FLEMING is a 62 y.o. male with PMH T2DM, HTN, HLD, heart failure on 2 L home oxygen, gout who presents emergency department for evaluation of left hand swelling.  Patient states that his left wrist has been swollen for the last 2 weeks and the pain is progressively worsened.  He states he has had gout in this hand before and this feels similar.  He does not endorse a change in his dietary habits with excessive meat or alcohol consumption recently.  He is on torsemide daily and is not on allopurinol preventive therapy.  Denies fever, chills, chest pain, shortness of breath or other systemic symptoms.  Past Medical History Past Medical History:  Diagnosis Date   Abnormal lung function test    a. Reported to possibly be COPD, but per pulm note: "Possible COPD - PFT was more suggestive of restrictive defect and diffusion defect likely from obesity and CHF"   Chronic diastolic heart failure ([place]) 05/07/2018   CKD (chronic kidney disease), stage III ([place])    Diabetes mellitus type 2 in obese ([facility])    Elevated troponin 05/07/2018   Hearing impaired    Hyperlipidemia    Hypertension    LBBB (left bundle branch block)    LVH (left ventricular hypertrophy)    Mild aortic stenosis    Mild pulmonary hypertension ([place])    Morbid obesity ([place])    Sleep apnea    Patient Active Problem List   Diagnosis Date Noted   Acute on chronic diastolic (congestive) heart failure ([place]) 08/21/2022   OSA (obstructive sleep apnea) 08/21/2022   Thyroid nodule 01/15/2020   PAD (peripheral artery disease) ([place]) 05/28/2019   DM2 (diabetes mellitus, type 2) ([place]) 09/26/2018   Obesity (BMI 30-39.9) 09/26/2018   Chronic diastolic heart failure ([place]) 05/07/2018   CKD (chronic kidney disease), stage III ([place]) 05/07/2018   Cardiomegaly    Encntr for general  adult medical exam w/o abnormal findings 04/25/2018   Hearing loss 04/25/2018   LBBB (left bundle branch block) 04/25/2018   Hyperlipidemia 04/25/2018   Other long term (current) drug therapy 04/25/2018   Other specified disorders of pigmentation 04/25/2018   Personal history of noncompliance with medical treatment, presenting hazards to health 04/25/2018   Sensorineural hearing loss (SNHL) of left ear 06/14/2017   Essential hypertension 06/05/2013   Pure hypercholesterolemia 06/05/2013   Vitamin D deficiency 06/05/2013   Corns and callosity 11/03/2010   Home Medication(s) Prior to Admission medications   Medication Sig Start Date End Date Taking? Authorizing Provider  HYDROcodone-acetaminophen (NORCO/VICODIN) 5-325 MG tablet Take 2 tablets by mouth every 6 (six) hours as needed for severe pain. 09/12/22  Yes Apryl Brymer, MD  atorvastatin (LIPITOR) 20 MG tablet TAKE 1 TABLET BY MOUTH EVERY DAY Patient taking differently: Take 20 mg by mouth daily. 08/10/22   [name], FNP  bictegravir-emtricitabine-tenofovir AF (BIKTARVY) 50-200-25 MG TABS tablet Take 1 tablet by mouth daily. Try to take at the same time each day with or without food. 09/09/22   Laurice Record, MD  bictegravir-emtricitabine-tenofovir AF (BIKTARVY) 50-200-25 MG TABS tablet Take 1 tablet by mouth daily for 14 days. 09/09/22 09/23/22  Esmond Plants, RPH-CPP  carvedilol (COREG) 6.25 MG tablet TAKE 1 TABLET BY MOUTH TWICE A DAY WITH FOOD Patient taking differently: Take 6.25 mg by mouth 2 (two) times daily with a meal.  08/10/22   [name], FNP  colchicine 0.6 MG tablet Take 1 tablet (0.6 mg total) by mouth daily for 1 dose. 09/12/22 09/13/22  Nayali Talerico, [place], MD  empagliflozin (JARDIANCE) 10 MG TABS tablet Take 1 tablet (10 mg total) by mouth daily. 08/27/22   Nita Sells, MD  losartan (COZAAR) 100 MG tablet Take 1 tablet (100 mg total) by mouth daily. 08/27/22   Nita Sells, MD  metFORMIN  (GLUCOPHAGE) 1000 MG tablet TAKE 1 TABLET (1,000 MG TOTAL) BY MOUTH TWICE A DAY WITH FOOD Patient taking differently: Take 1,000 mg by mouth 2 (two) times daily with a meal. 08/10/22   [name], FNP  methylPREDNISolone (MEDROL DOSEPAK) 4 MG TBPK tablet Take as prescribed 09/12/22   Liston Thum, MD  torsemide (DEMADEX) 20 MG tablet TAKE 2 TABLETS (40 MG TOTAL) BY MOUTH DAILY. 08/10/22   [name], FNP                                                                                                                                    Past Surgical History Past Surgical History:  Procedure Laterality Date   BACK SURGERY     TRACHEOSTOMY     Family History Family History  Problem Relation Age of Onset   Hypertension Mother    Hypertension Father     Social History Social History   Tobacco Use   Smoking status: Former    Years: 1.00    Types: Cigarettes    Quit date: 1999    Years since quitting: 25.0   Smokeless tobacco: Never   Tobacco comments:    1 pack would last one month--08/01/18  Vaping Use   Vaping Use: Never used  Substance Use Topics   Alcohol use: No   Drug use: No   Allergies Patient has no known allergies.  Review of Systems Review of Systems  Musculoskeletal:  Positive for arthralgias and joint swelling.    Physical Exam Vital Signs  I have reviewed the triage vital signs BP (!) 134/93   Pulse 71   Temp 98.1 F (36.7 C)   Resp 18   Ht 5\' 11"  (1.803 m)   Wt 114.6 kg   SpO2 100%   BMI 35.24 kg/m   Physical Exam Constitutional:      General: He is not in acute distress.    Appearance: Normal appearance.  HENT:     Head: Normocephalic and atraumatic.     Nose: No congestion or rhinorrhea.  Eyes:     General:        Right eye: No discharge.        Left eye: No discharge.     Extraocular Movements: Extraocular movements intact.     Pupils: Pupils are equal, round, and reactive to light.  Cardiovascular:     Rate and Rhythm: Normal  rate and regular rhythm.     Heart sounds: No murmur heard.  Pulmonary:     Effort: No respiratory distress.     Breath sounds: No wheezing or rales.  Abdominal:     General: There is no distension.     Tenderness: There is no abdominal tenderness.  Musculoskeletal:        General: Swelling and tenderness present. Normal range of motion.     Cervical back: Normal range of motion.  Skin:    General: Skin is warm and dry.  Neurological:     General: No focal deficit present.     Mental Status: He is alert.     ED Results and Treatments Labs (all labs ordered are listed, but only abnormal results are displayed) Labs Reviewed  BASIC METABOLIC PANEL - Abnormal; Notable for the following components:      Result Value   Glucose, Bld 115 (*)    BUN 39 (*)    Creatinine, Ser 1.95 (*)    GFR, Estimated 38 (*)    All other components within normal limits  CBC WITH DIFFERENTIAL/PLATELET                                                                                                                          Radiology DG Wrist Complete Left  Result Date: 09/12/2022 CLINICAL DATA:  Initial evaluation for acute pain for several weeks. No injury. EXAM: LEFT WRIST - COMPLETE 3+ VIEW COMPARISON:  None Available. FINDINGS: No acute fracture dislocation. No visible soft tissue abnormality about the wrist. Mild osteopenia. No focal osseous lesions. Scattered osteoarthritic changes noted about the partially visualized hand, better evaluated on concomitant hand radiograph. IMPRESSION: No acute osseous abnormality about the left wrist. Electronically Signed   By: Jeannine Boga M.D.   On: 09/12/2022 03:14   DG Hand Complete Left  Result Date: 09/12/2022 CLINICAL DATA:  Initial evaluation for acute pain and swelling for several weeks. No trauma. EXAM: LEFT HAND - COMPLETE 3+ VIEW COMPARISON:  None Available. FINDINGS: No acute fracture dislocation. Moderate osteoarthritic changes seen throughout  the hand, most pronounced at the first through third D IP and PIP articulations. Mild osteopenia. Mild diffuse soft tissue swelling about the hand. No radiopaque foreign body or abnormal soft tissue emphysema. IMPRESSION: 1. No acute osseous abnormality. 2. Moderate osteoarthritic changes about the hand, most pronounced at the first through third D IP and PIP articulations. 3. Mild diffuse soft tissue swelling about the hand. Electronically Signed   By: Jeannine Boga M.D.   On: 09/12/2022 03:12    Pertinent labs & imaging results that were available during my care of the patient were reviewed by me and considered in my medical decision making (see MDM for details).  Medications Ordered in ED Medications  colchicine tablet 1.2 mg (1.2 mg Oral Given 09/12/22 0940)  dexamethasone (DECADRON) injection 8 mg (8 mg Intramuscular Given 09/12/22 0941)  Procedures Procedures  (including critical care time)  Medical Decision Making / ED Course   This patient presents to the ED for concern of wrist pain and swelling, this involves an extensive number of treatment options, and is a complaint that carries with it a high risk of complications and morbidity.  The differential diagnosis includes gout, pseudogout, septic joint, fracture, arthritis  MDM: Patient seen emerged part for evaluation of wrist pain and swelling.  Physical exam with swelling and tenderness to the left wrist but no surrounding erythema, open wounds or purulent drainage seen.  Range of motion limited by pain but neurologic exam is unremarkable.  Pulses intact.  X-ray with no fracture.  Laboratory evaluation with a BUN of 39, creatinine 1.95 which is baseline for this patient.  There is no leukocytosis and I overall have lower suspicion for septic joint.  Given patient's previous history of gout in a similar  place, no associated fevers and no white count, patient presentation likely consistent with gout.  NSAID therapy is contraindicated in this patient and thus he was given colchicine and steroids.  He was discharged with an additional single dose colchicine, Medrol Dosepak and opioids for breakthrough pain.  Referral sent to hand clinic.  Patient then discharged with outpatient follow-up and return precautions which he voiced understanding.   Additional history obtained:  -External records from outside source obtained and reviewed including: Chart review including previous notes, labs, imaging, consultation notes   Lab Tests: -I ordered, reviewed, and interpreted labs.   The pertinent results include:   Labs Reviewed  BASIC METABOLIC PANEL - Abnormal; Notable for the following components:      Result Value   Glucose, Bld 115 (*)    BUN 39 (*)    Creatinine, Ser 1.95 (*)    GFR, Estimated 38 (*)    All other components within normal limits  CBC WITH DIFFERENTIAL/PLATELET       Imaging Studies ordered: I ordered imaging studies including XR wrist, XR hand I independently visualized and interpreted imaging. I agree with the radiologist interpretation   Medicines ordered and prescription drug management: Meds ordered this encounter  Medications   colchicine tablet 1.2 mg   dexamethasone (DECADRON) injection 8 mg   DISCONTD: colchicine 0.6 MG tablet    Sig: Take 1 tablet (0.6 mg total) by mouth daily for 1 dose.    Dispense:  1 tablet    Refill:  0   DISCONTD: methylPREDNISolone (MEDROL DOSEPAK) 4 MG TBPK tablet    Sig: Take as prescribed    Dispense:  1 each    Refill:  0   colchicine 0.6 MG tablet    Sig: Take 1 tablet (0.6 mg total) by mouth daily for 1 dose.    Dispense:  1 tablet    Refill:  0   methylPREDNISolone (MEDROL DOSEPAK) 4 MG TBPK tablet    Sig: Take as prescribed    Dispense:  1 each    Refill:  0   HYDROcodone-acetaminophen (NORCO/VICODIN) 5-325 MG tablet     Sig: Take 2 tablets by mouth every 6 (six) hours as needed for severe pain.    Dispense:  10 tablet    Refill:  0    -I have reviewed the patients home medicines and have made adjustments as needed  Critical interventions none   Cardiac Monitoring: The patient was maintained on a cardiac monitor.  I personally viewed and interpreted the cardiac monitored which showed an underlying rhythm of: NSR  Social Determinants of Health:  Factors impacting patients care include: none   Reevaluation: After the interventions noted above, I reevaluated the patient and found that they have :improved  Co morbidities that complicate the patient evaluation  Past Medical History:  Diagnosis Date   Abnormal lung function test    a. Reported to possibly be COPD, but per pulm note: "Possible COPD - PFT was more suggestive of restrictive defect and diffusion defect likely from obesity and CHF"   Chronic diastolic heart failure ([place]) 05/07/2018   CKD (chronic kidney disease), stage III ([place])    Diabetes mellitus type 2 in obese [facility])    Elevated troponin 05/07/2018   Hearing impaired    Hyperlipidemia    Hypertension    LBBB (left bundle branch block)    LVH (left ventricular hypertrophy)    Mild aortic stenosis    Mild pulmonary hypertension ([place])    Morbid obesity ([place])    Sleep apnea       Dispostion: I considered admission for this patient, but he currently does not meet inpatient criteria and he is safe for discharge with outpatient hand follow-up     Final Clinical Impression(s) / ED Diagnoses Final diagnoses:  Acute gout of left hand, unspecified cause     @PCDICTATION @    Lenny Bouchillon, Debe Coder, MD 09/12/22 1753

## 2022-09-14 ENCOUNTER — Telehealth: Payer: Self-pay

## 2022-09-14 NOTE — Telephone Encounter (Signed)
Transition Care Management Follow-up Telephone Call Date of discharge and from where: 09/12/2022 [facility]  How have you been since you were released from the hospital? Pt states he is doing okay. He has an apt scheduled with Dr Coletta Memos on Wednesday.  Any questions or concerns? No  Items Reviewed: Did the pt receive and understand the discharge instructions provided? Yes  Medications obtained and verified? Yes  Other? Yes  Any new allergies since your discharge? No  Dietary orders reviewed? Yes Do you have support at home? Yes   Home Care and Equipment/Supplies: Were home health services ordered? no If so, what is the name of the agency? N/a  Has the agency set up a time to come to the patient's home? no Were any new equipment or medical supplies ordered?  No What is the name of the medical supply agency? N/a Were you able to get the supplies/equipment? no Do you have any questions related to the use of the equipment or supplies? No  Functional Questionnaire: (I = Independent and D = Dependent) ADLs: i  Bathing/Dressing- i  Meal Prep- i  Eating- i  Maintaining continence- i  Transferring/Ambulation- i  Managing Meds- i  Follow up appointments reviewed:  PCP Hospital f/u appt confirmed? No  Scheduled to see n/a on n/a @ n/a. [facility] f/u appt confirmed? Yes  Scheduled to see Coletta Memos  on 09/16/2022 @ [facility] at [address]. Are transportation arrangements needed? No  If their condition worsens, is the pt aware to call PCP or go to the Emergency Dept.? Yes Was the patient provided with contact information for the PCP's office or ED? Yes Was to pt encouraged to call back with questions or concerns? Yes

## 2022-09-14 NOTE — Progress Notes (Signed)
Cardiology Clinic Note   Patient Name: Mark [name] Date of Encounter: 09/16/2022  Primary Care Provider:  [name], FNP Primary Cardiologist:  Fransico Him, MD  Patient Profile    Mark [name] 62 year old male presents to the clinic today for his essential hypertension and chronic diastolic CHF.  Past Medical History    Past Medical History:  Diagnosis Date   Abnormal lung function test    a. Reported to possibly be COPD, but per pulm note: "Possible COPD - PFT was more suggestive of restrictive defect and diffusion defect likely from obesity and CHF"   Chronic diastolic heart failure ([place]) 05/07/2018   CKD (chronic kidney disease), stage III ([place])    Diabetes mellitus type 2 in obese [facility])    Elevated troponin 05/07/2018   Hearing impaired    Hyperlipidemia    Hypertension    LBBB (left bundle branch block)    LVH (left ventricular hypertrophy)    Mild aortic stenosis    Mild pulmonary hypertension ([place])    Morbid obesity ([place])    Sleep apnea    Past Surgical History:  Procedure Laterality Date   BACK SURGERY     TRACHEOSTOMY      Allergies  No Known Allergies  History of Present Illness    Mark [name] has a PMH of essential hypertension LBBB, chronic diastolic CHF, PAD, OSA, type 2 diabetes, thyroid nodule, sensorineural hearing loss (left ear), CKD stage III, hyperlipidemia, obesity, and vitamin D deficiency.  He was seen by Dr. Audie Box 08/21/2022 as a referral from his PCP for evaluation of HFpEF.  He reported that over the previous 1-2 weeks he become more short of breath with activity.  His oxygen saturation was noted to be 72% on no oxygen.  He appears to be fluid volume overloaded.  His blood pressure was elevated.  He reported no fever and chills.  He did report a history of diastolic heart failure.  His EKG showed sinus rhythm with left bundle branch block.  He was noted to have crackles on evaluation as well as lower extremity edema.  He  was placed on 2 L nasal cannula and his oxygen saturation improved to the low 90s.  It was discussed that because his oxygen saturation and respiratory failure were  He would need to be evaluated in the emergency room.  He was transferred to the hospital via EMS.  Chest x-ray, CMP, CBC, BMP and echocardiogram were requested.  He received IV diuresis and was discharged in stable condition.  Jardiance was also added to his medication regimen.    Today he continues to lose weight.  His weight today is 246 pounds down from 253 pounds.  He is back to being somewhat physically active at home walking around his house and doing all of his activities of daily living.  I instructed him that he may decrease his oxygen from 2 L to 1 L for the next few days to see how he feels.  If he continues to do well with 1 L he may titrate to oxygen 1 L as needed.  He expressed understanding.  We reviewed his recent hospitalization and his echocardiogram.  I will give him a salty 6 diet sheet, have him continue to increase his physical activity as tolerated, begin to wean his oxygen and plan follow-up in 4 months.   Today he denies chest pain, shortness of breath, lower extremity edema, fatigue, palpitations, melena, hematuria, hemoptysis, diaphoresis, weakness, presyncope, syncope,  orthopnea, and PND.      Home Medications    Prior to Admission medications   Medication Sig Start Date End Date Taking? Authorizing Provider  atorvastatin (LIPITOR) 20 MG tablet TAKE 1 TABLET BY MOUTH EVERY DAY Patient taking differently: Take 20 mg by mouth daily. 08/10/22   [name], FNP  bictegravir-emtricitabine-tenofovir AF (BIKTARVY) 50-200-25 MG TABS tablet Take 1 tablet by mouth daily. Try to take at the same time each day with or without food. 09/09/22   Danelle Earthly, MD  bictegravir-emtricitabine-tenofovir AF (BIKTARVY) 50-200-25 MG TABS tablet Take 1 tablet by mouth daily for 14 days. 09/09/22 09/23/22  Jennette Kettle,  RPH-CPP  carvedilol (COREG) 6.25 MG tablet TAKE 1 TABLET BY MOUTH TWICE A DAY WITH FOOD Patient taking differently: Take 6.25 mg by mouth 2 (two) times daily with a meal. 08/10/22   [name], FNP  colchicine 0.6 MG tablet Take 1 tablet (0.6 mg total) by mouth daily for 1 dose. 09/12/22 09/13/22  Kommor, Madison, MD  empagliflozin (JARDIANCE) 10 MG TABS tablet Take 1 tablet (10 mg total) by mouth daily. 08/27/22   Rhetta Mura, MD  HYDROcodone-acetaminophen (NORCO/VICODIN) 5-325 MG tablet Take 2 tablets by mouth every 6 (six) hours as needed for severe pain. 09/12/22   Kommor, Madison, MD  losartan (COZAAR) 100 MG tablet Take 1 tablet (100 mg total) by mouth daily. 08/27/22   Rhetta Mura, MD  metFORMIN (GLUCOPHAGE) 1000 MG tablet TAKE 1 TABLET (1,000 MG TOTAL) BY MOUTH TWICE A DAY WITH FOOD Patient taking differently: Take 1,000 mg by mouth 2 (two) times daily with a meal. 08/10/22   [name], FNP  methylPREDNISolone (MEDROL DOSEPAK) 4 MG TBPK tablet Take as prescribed 09/12/22   Kommor, Madison, MD  torsemide (DEMADEX) 20 MG tablet TAKE 2 TABLETS (40 MG TOTAL) BY MOUTH DAILY. 08/10/22   [name], FNP    Family History    Family History  Problem Relation Age of Onset   Hypertension Mother    Hypertension Father    He indicated that his mother is deceased. He indicated that his father is deceased. He indicated that his maternal grandmother is deceased. He indicated that his maternal grandfather is deceased. He indicated that his paternal grandmother is deceased. He indicated that his paternal grandfather is deceased.  Social History    Social History   Socioeconomic History   Marital status: Divorced    Spouse name: Not on file   Number of children: Not on file   Years of education: Not on file   Highest education level: Not on file  Occupational History   Occupation: employed  Tobacco Use   Smoking status: Former    Years: 1.00    Types: Cigarettes     Quit date: 1999    Years since quitting: 25.0   Smokeless tobacco: Never   Tobacco comments:    1 pack would last one month--08/01/18  Vaping Use   Vaping Use: Never used  Substance and Sexual Activity   Alcohol use: No   Drug use: No   Sexual activity: Not Currently  Other Topics Concern   Not on file  Social History Narrative   Not on file   Social Determinants of Health   Financial Resource Strain: Low Risk  (04/08/2022)   Overall Financial Resource Strain (CARDIA)    Difficulty of Paying Living Expenses: Not hard at all  Food Insecurity: No Food Insecurity (08/27/2022)   Hunger Vital Sign    Worried About  Running Out of Food in the Last Year: Never true    [place] in the Last Year: Never true  Transportation Needs: No Transportation Needs (09/07/2022)   PRAPARE - Hydrologist (Medical): No    Lack of Transportation (Non-Medical): No  Physical Activity: Inactive (04/08/2022)   Exercise Vital Sign    Days of Exercise per Week: 0 days    Minutes of Exercise per Session: 0 min  Stress: No Stress Concern Present (04/08/2022)   [place]    Feeling of Stress : Not at all  Social Connections: Not on file  Intimate Partner Violence: Not At Risk (08/21/2022)   Humiliation, Afraid, Rape, and Kick questionnaire    Fear of Current or Ex-Partner: No    Emotionally Abused: No    Physically Abused: No    Sexually Abused: No     Review of Systems    General:  No chills, fever, night sweats or weight changes.  Cardiovascular:  No chest pain, dyspnea on exertion, edema, orthopnea, palpitations, paroxysmal nocturnal dyspnea. Dermatological: No rash, lesions/masses Respiratory: No cough, dyspnea Urologic: No hematuria, dysuria Abdominal:   No nausea, vomiting, diarrhea, bright red blood per rectum, melena, or hematemesis Neurologic:  No visual changes, wkns, changes in mental  status. All other systems reviewed and are otherwise negative except as noted above.  Physical Exam    VS:  BP 130/82   Pulse 82   Ht 5\' 10"  (1.778 m)   Wt 246 lb (111.6 kg)   SpO2 95%   BMI 35.30 kg/m  , BMI Body mass index is 35.3 kg/m. GEN: Well nourished, well developed, in no acute distress. HEENT: normal. Neck: Supple, no JVD, carotid bruits, or masses. Cardiac: RRR, no murmurs, rubs, or gallops. No clubbing, cyanosis, edema.  Radials/DP/PT 2+ and equal bilaterally.  Respiratory:  Respirations regular and unlabored, clear to auscultation bilaterally. GI: Soft, nontender, nondistended, BS + x 4. MS: no deformity or atrophy. Skin: warm and dry, no rash. Neuro:  Strength and sensation are intact. Psych: Normal affect.  Accessory Clinical Findings    Recent Labs: 08/21/2022: B Natriuretic Peptide 241.2 08/26/2022: Magnesium 2.6 09/09/2022: ALT 14 09/12/2022: BUN 39; Creatinine, Ser 1.95; Hemoglobin 14.2; Platelets 208; Potassium 4.1; Sodium 141   Recent Lipid Panel    Component Value Date/Time   CHOL 184 09/09/2022 1110   CHOL 160 01/05/2022 1548   TRIG 162 (H) 09/09/2022 1110   HDL 46 09/09/2022 1110   HDL 56 01/05/2022 1548   CHOLHDL 4.0 09/09/2022 1110   VLDL 8 05/08/2018 0520   LDLCALC 110 (H) 09/09/2022 1110         ECG personally reviewed by me today- none today.  Echocardiogram 08/22/2022  IMPRESSIONS     1. Severe asymmetrical septal hypertrophy with otherwise moderate LVH of  other wall segments. No dynamic LVOT gradient. . Left ventricular ejection  fraction, by estimation, is 50 to 55%. The left ventricle has low normal  function. The left ventricle has   no regional wall motion abnormalities. There is severe asymmetric left  ventricular hypertrophy of the septal segment. Left ventricular diastolic  parameters are consistent with Grade I diastolic dysfunction (impaired  relaxation). Elevated left atrial  pressure.   2. Right ventricular  systolic function is normal. The right ventricular  size is normal. Tricuspid regurgitation signal is inadequate for assessing  PA pressure.   3. The mitral valve is  abnormal. Mild mitral valve regurgitation. No  evidence of mitral stenosis.   4. The aortic valve is tricuspid. There is mild calcification of the  aortic valve. There is mild thickening of the aortic valve. Aortic valve  regurgitation is not visualized. No aortic stenosis is present.   5. The inferior vena cava is normal in size with greater than 50%  respiratory variability, suggesting right atrial pressure of 3 mmHg.   FINDINGS   Left Ventricle: Severe asymmetrical septal hypertrophy with otherwise  moderate LVH of other wall segments. No dynamic LVOT gradient. Left  ventricular ejection fraction, by estimation, is 50 to 55%. The left  ventricle has low normal function. The left  ventricle has no regional wall motion abnormalities. The left ventricular  internal cavity size was normal in size. There is severe asymmetric left  ventricular hypertrophy of the septal segment. Left ventricular diastolic  parameters are consistent with  Grade I diastolic dysfunction (impaired relaxation). Elevated left atrial  pressure.   Right Ventricle: The right ventricular size is normal. Right vetricular  wall thickness was not well visualized. Right ventricular systolic  function is normal. Tricuspid regurgitation signal is inadequate for  assessing PA pressure.   Left Atrium: Left atrial size was normal in size.   Right Atrium: Right atrial size was normal in size.   Pericardium: There is no evidence of pericardial effusion.   Mitral Valve: The mitral valve is abnormal. There is mild thickening of  the mitral valve leaflet(s). There is mild calcification of the mitral  valve leaflet(s). Mild mitral annular calcification. Mild mitral valve  regurgitation. No evidence of mitral  valve stenosis.   Tricuspid Valve: The tricuspid  valve is normal in structure. Tricuspid  valve regurgitation is trivial. No evidence of tricuspid stenosis.   Aortic Valve: The aortic valve is tricuspid. There is mild calcification  of the aortic valve. There is mild thickening of the aortic valve. There  is mild aortic valve annular calcification. Aortic valve regurgitation is  not visualized. No aortic stenosis   is present. Aortic valve mean gradient measures 11.0 mmHg. Aortic valve  peak gradient measures 19.0 mmHg. Aortic valve area, by VTI measures 3.06  cm.   Pulmonic Valve: The pulmonic valve was not well visualized. Pulmonic valve  regurgitation is not visualized. No evidence of pulmonic stenosis.   Aorta: The aortic root is normal in size and structure.   Venous: The inferior vena cava is normal in size with greater than 50%  respiratory variability, suggesting right atrial pressure of 3 mmHg.   IAS/Shunts: No atrial level shunt detected by color flow Doppler.    Assessment & Plan   1.  Chronic diastolic CHF-euvolemic.  Weight stable.  NYHA class II.  He was admitted to the hospital on 08/21/2022 and discharged on 08/26/2022.  Echocardiogram 08/22/2022 showed an EF of 50-55%, severe septal LVH, G1 DD, mild MR, mild calcification of AV.  He diuresed well, Jardiance was added to his medication regimen.  His dry weight was felt to be 118 kg. Continue torsemide, Jardiance, carvedilol, losartan Heart healthy low-sodium diet-salty 6 given Increase physical activity as tolerated  Essential hypertension-BP today 130/82 Continue carvedilol, losartan Heart healthy low-sodium diet-salty 6 given Increase physical activity as tolerated   Hyperlipidemia-LDL 110 on 09/09/22 Continue atorvastatin Heart healthy low-sodium high-fiber diet Increase physical activity as tolerated Follows with PCP  Left bundle branch block-heart rate today 82 bpm.  Denies lightheadedness, presyncope or syncope. Continue to monitor  Disposition:  Follow-up  with Dr. Flora Lipps or me in 4 months.    Thomasene Ripple. Vaiden Adames NP-C     09/16/2022, 9:56 AM [facility] [address] Office [phone] Fax [phone]    I spent 14 minutes examining this patient, reviewing medications, and using patient centered shared decision making involving her cardiac care.  Prior to her visit I spent greater than 20 minutes reviewing her past medical history,  medications, and prior cardiac tests.

## 2022-09-14 NOTE — Telephone Encounter (Signed)
Patient called office today stating he tried to get his [place] filled at [facility] and was told he had to pay high copay.  Per chart copay card was provided to him on 1/12. Patient states he did not have copay card info.   Called [facility] and provided them with this information. Should be able to have prescription filled later today with zero dollar copay. Leatrice Jewels, RMA

## 2022-09-15 ENCOUNTER — Telehealth: Payer: Self-pay

## 2022-09-15 DIAGNOSIS — M1030 Gout due to renal impairment, unspecified site: Secondary | ICD-10-CM | POA: Diagnosis not present

## 2022-09-15 DIAGNOSIS — I130 Hypertensive heart and chronic kidney disease with heart failure and stage 1 through stage 4 chronic kidney disease, or unspecified chronic kidney disease: Secondary | ICD-10-CM | POA: Diagnosis not present

## 2022-09-15 DIAGNOSIS — I272 Pulmonary hypertension, unspecified: Secondary | ICD-10-CM | POA: Diagnosis not present

## 2022-09-15 DIAGNOSIS — N183 Chronic kidney disease, stage 3 unspecified: Secondary | ICD-10-CM | POA: Diagnosis not present

## 2022-09-15 DIAGNOSIS — I350 Nonrheumatic aortic (valve) stenosis: Secondary | ICD-10-CM | POA: Diagnosis not present

## 2022-09-15 DIAGNOSIS — I5033 Acute on chronic diastolic (congestive) heart failure: Secondary | ICD-10-CM | POA: Diagnosis not present

## 2022-09-15 DIAGNOSIS — E1122 Type 2 diabetes mellitus with diabetic chronic kidney disease: Secondary | ICD-10-CM | POA: Diagnosis not present

## 2022-09-15 DIAGNOSIS — G4733 Obstructive sleep apnea (adult) (pediatric): Secondary | ICD-10-CM | POA: Diagnosis not present

## 2022-09-15 DIAGNOSIS — I447 Left bundle-branch block, unspecified: Secondary | ICD-10-CM | POA: Diagnosis not present

## 2022-09-15 NOTE — Telephone Encounter (Signed)
Called patient to complete new HIV intake. He seems confused about which office Dr. Candiss Norse is from.   We discussed his new HIV diagnosis and he states that he was shocked by this. He does not feel comfortable discussing his new diagnosis with any friends or family.   He lives alone and is not currently working. Says he likes to clean the house and do puzzles in his spare time.   He does not seem to have a good understanding about HIV and I feel he would benefit from in-person intake versus phone call.   Beryle Flock, RN

## 2022-09-16 ENCOUNTER — Ambulatory Visit: Payer: Medicare HMO | Attending: General Practice | Admitting: General Practice

## 2022-09-16 ENCOUNTER — Encounter: Payer: Self-pay | Admitting: General Practice

## 2022-09-16 VITALS — BP 130/82 | HR 82 | Ht 70.0 in | Wt 246.0 lb

## 2022-09-16 DIAGNOSIS — I10 Essential (primary) hypertension: Secondary | ICD-10-CM

## 2022-09-16 DIAGNOSIS — I447 Left bundle-branch block, unspecified: Secondary | ICD-10-CM

## 2022-09-16 DIAGNOSIS — I5031 Acute diastolic (congestive) heart failure: Secondary | ICD-10-CM | POA: Diagnosis not present

## 2022-09-16 DIAGNOSIS — E782 Mixed hyperlipidemia: Secondary | ICD-10-CM

## 2022-09-16 NOTE — Patient Instructions (Addendum)
Medication Instructions:  The current medical regimen is effective;  continue present plan and medications as directed. Please refer to the Current Medication list given to you today.  *If you need a refill on your cardiac medications before your next appointment, please call your pharmacy*  Lab Work: NONE If you have labs (blood work) drawn today and your tests are completely normal, you will receive your results only by: [place] (if you have MyChart) OR A paper copy in the mail If you have any lab test that is abnormal or we need to change your treatment, we will call you to review the results.  Testing/Procedures: NONE  Follow-Up: At [facility], you and your health needs are our priority.  As part of our continuing mission to provide you with exceptional heart care, we have created designated Provider Care Teams.  These Care Teams include your primary Cardiologist (physician) and Advanced Practice Providers (APPs -  Physician Assistants and Nurse Practitioners) who all work together to provide you with the care you need, when you need it.  We recommend signing up for the patient portal called "MyChart".  Sign up information is provided on this After Visit Summary.  MyChart is used to connect with patients for Virtual Visits (Telemedicine).  Patients are able to view lab/test results, encounter notes, upcoming appointments, etc.  Non-urgent messages can be sent to your provider as well.   To learn more about what you can do with MyChart, go to [email or web address].    Your next appointment:   4 month(s)  Provider:   Evalina Field, MD     Other Instructions PLEASE READ AND FOLLOW ATTACHED  SALTY 6  INCREASE PHYSICAL ACTIVITY-AS TOLERATED  WEAN YOURSELF OFF THE OXYGEN 1 LITER FOR THE NEXT COUPLE DAYS AND IF YOUR FEEL GOOD THEN OFF  [address], [place],  [address]

## 2022-09-18 ENCOUNTER — Ambulatory Visit: Payer: Self-pay

## 2022-09-18 ENCOUNTER — Telehealth (HOSPITAL_COMMUNITY): Payer: Self-pay

## 2022-09-18 NOTE — Patient Outreach (Signed)
  Care Coordination   Follow Up Visit Note   09/18/2022 Name: Mark [name] MRN: [id number] DOB: [birth year]  Mark [name] is a 62 y.o. year old male who sees [name], [place] for primary care. I spoke with  Mark [name] by phone today.  What matters to the patients health and wellness today?  Patient would like to keep his heart failure under good control.     Goals Addressed               This Visit's Progress     Patient Stated     COMPLETED: I need portable oxygen tanks (pt-stated)        Care Coordination Interventions: Determined patient has an Oxygen concentrator with a compressor that allows him to self fill his portable O2 tank Determined patient was instructed by the respiratory therapist with [place] on how to use his equipment Patient verbalizes adherence to wearing his continuous Oxygen at 2 lts per nasal cannula Reviewed with patient Cardiologist recommendations to decrease Oxygen to 1 lt if tolerated, patient will keep at 2 lts at this time due to having lower Oxygen sats with decreased Oxygen  Instructed patient to call his doctor with new or worsening symptoms       To manage heart failure (pt-stated)        Care Coordination Interventions: Basic overview and discussion of pathophysiology of Heart Failure reviewed Reviewed Heart Failure Action Plan in depth and provided written copy Assessed need for readable accurate scales in home Provided education about placing scale on hard, flat surface Advised patient to weigh each morning after emptying bladder Discussed importance of daily weight and advised patient to weigh and record daily Discussed the importance of keeping all appointments with provider           SDOH assessments and interventions completed:  No     Care Coordination Interventions:  Yes, provided   Follow up plan: Follow up call scheduled for 10/16/22 @0930  AM    Encounter Outcome:  Pt. Visit Completed

## 2022-09-18 NOTE — Telephone Encounter (Signed)
Called to confirm Heart & Vascular Transitions of Care appointment at 09/21/22.   Left message to confirm appointment. 

## 2022-09-18 NOTE — Patient Instructions (Signed)
Visit Information  Thank you for taking time to visit with me today. Please don't hesitate to contact me if I can be of assistance to you.   Following are the goals we discussed today:   Goals Addressed               This Visit's Progress     Patient Stated     COMPLETED: I need portable oxygen tanks (pt-stated)        Care Coordination Interventions: Determined patient has an Oxygen concentrator with a compressor that allows him to self fill his portable O2 tank Determined patient was instructed by the respiratory therapist with [place] on how to use his equipment Patient verbalizes adherence to wearing his continuous Oxygen at 2 lts per nasal cannula Reviewed with patient Cardiologist recommendations to decrease Oxygen to 1 lt if tolerated, patient will keep at 2 lts at this time due to having lower Oxygen sats with decreased Oxygen  Instructed patient to call his doctor with new or worsening symptoms       To manage heart failure (pt-stated)        Care Coordination Interventions: Basic overview and discussion of pathophysiology of Heart Failure reviewed Reviewed Heart Failure Action Plan in depth and provided written copy Assessed need for readable accurate scales in home Provided education about placing scale on hard, flat surface Advised patient to weigh each morning after emptying bladder Discussed importance of daily weight and advised patient to weigh and record daily Discussed the importance of keeping all appointments with provider           Our next appointment is by telephone on 10/16/22 at 0930 AM  Please call the care guide team at [phone] if you need to cancel or reschedule your appointment.   If you are experiencing a Mental Health or [place] or need someone to talk to, please go to [facility] [phone])  The patient verbalized understanding of instructions,  educational materials, and care plan provided today and agreed to receive a mailed copy of patient instructions, educational materials, and care plan.   Barb Merino, RN, BSN, CCM Care Management Coordinator [facility] Care Management Direct Phone: [phone]

## 2022-09-20 LAB — QUANTIFERON-TB GOLD PLUS
Mitogen-NIL: 6.67 IU/mL
NIL: 0.03 IU/mL
QuantiFERON-TB Gold Plus: NEGATIVE
TB1-NIL: 0 IU/mL
TB2-NIL: 0.01 IU/mL

## 2022-09-20 LAB — CBC WITH DIFFERENTIAL/PLATELET
Absolute Monocytes: 574 cells/uL (ref 200–950)
Basophils Absolute: 17 cells/uL (ref 0–200)
Basophils Relative: 0.3 %
Eosinophils Absolute: 58 cells/uL (ref 15–500)
Eosinophils Relative: 1 %
HCT: 47.3 % (ref 38.5–50.0)
Hemoglobin: 15.6 g/dL (ref 13.2–17.1)
Lymphs Abs: 1119 cells/uL (ref 850–3900)
MCH: 29.3 pg (ref 27.0–33.0)
MCHC: 33 g/dL (ref 32.0–36.0)
MCV: 88.9 fL (ref 80.0–100.0)
MPV: 11.9 fL (ref 7.5–12.5)
Monocytes Relative: 9.9 %
Neutro Abs: 4031 cells/uL (ref 1500–7800)
Neutrophils Relative %: 69.5 %
Platelets: 226 10*3/uL (ref 140–400)
RBC: 5.32 10*6/uL (ref 4.20–5.80)
RDW: 14.1 % (ref 11.0–15.0)
Total Lymphocyte: 19.3 %
WBC: 5.8 10*3/uL (ref 3.8–10.8)

## 2022-09-20 LAB — COMPLETE METABOLIC PANEL WITH GFR
AG Ratio: 1.3 (calc) (ref 1.0–2.5)
ALT: 14 U/L (ref 9–46)
AST: 13 U/L (ref 10–35)
Albumin: 4.2 g/dL (ref 3.6–5.1)
Alkaline phosphatase (APISO): 100 U/L (ref 35–144)
BUN/Creatinine Ratio: 22 (calc) (ref 6–22)
BUN: 35 mg/dL — ABNORMAL HIGH (ref 7–25)
CO2: 33 mmol/L — ABNORMAL HIGH (ref 20–32)
Calcium: 10.1 mg/dL (ref 8.6–10.3)
Chloride: 96 mmol/L — ABNORMAL LOW (ref 98–110)
Creat: 1.59 mg/dL — ABNORMAL HIGH (ref 0.70–1.35)
Globulin: 3.2 g/dL (calc) (ref 1.9–3.7)
Glucose, Bld: 91 mg/dL (ref 65–99)
Potassium: 4.6 mmol/L (ref 3.5–5.3)
Sodium: 141 mmol/L (ref 135–146)
Total Bilirubin: 0.9 mg/dL (ref 0.2–1.2)
Total Protein: 7.4 g/dL (ref 6.1–8.1)
eGFR: 48 mL/min/{1.73_m2} — ABNORMAL LOW (ref >=60)

## 2022-09-20 LAB — GLUCOSE 6 PHOSPHATE DEHYDROGENASE: G-6PDH: 20.1 U/g Hgb (ref 7.0–20.5)

## 2022-09-20 LAB — RPR: RPR Ser Ql: NONREACTIVE

## 2022-09-20 LAB — LIPID PANEL
Cholesterol: 184 mg/dL (ref <200)
HDL: 46 mg/dL (ref >=40)
LDL Cholesterol (Calc): 110 mg/dL (calc) — ABNORMAL HIGH
Non-HDL Cholesterol (Calc): 138 mg/dL (calc) — ABNORMAL HIGH (ref <130)
Total CHOL/HDL Ratio: 4 (calc) (ref <5.0)
Triglycerides: 162 mg/dL — ABNORMAL HIGH (ref <150)

## 2022-09-20 LAB — HIV-1 GENOTYPE: HIV-1 Genotype: DETECTED — AB

## 2022-09-20 LAB — HIV-1 RNA ULTRAQUANT REFLEX TO GENTYP+
HIV 1 RNA Quant: 5490 copies/mL — ABNORMAL HIGH
HIV-1 RNA Quant, Log: 3.74 Log copies/mL — ABNORMAL HIGH

## 2022-09-20 LAB — HLA B*5701: HLA-B*5701 w/rflx HLA-B High: NEGATIVE

## 2022-09-20 LAB — HEPATITIS B CORE ANTIBODY, TOTAL: Hep B Core Total Ab: NONREACTIVE

## 2022-09-21 ENCOUNTER — Encounter (HOSPITAL_COMMUNITY): Payer: Medicare HMO

## 2022-09-21 ENCOUNTER — Other Ambulatory Visit (HOSPITAL_COMMUNITY): Payer: Self-pay

## 2022-09-21 ENCOUNTER — Encounter (HOSPITAL_COMMUNITY): Payer: Self-pay

## 2022-09-21 ENCOUNTER — Ambulatory Visit (HOSPITAL_COMMUNITY)
Admission: RE | Admit: 2022-09-21 | Discharge: 2022-09-21 | Disposition: A | Discharge status: Home / Self Care | Payer: Medicare HMO | Source: Ambulatory Visit | Attending: Cardiology | Admitting: Cardiology

## 2022-09-21 VITALS — BP 132/102 | HR 75 | Resp 99 | Wt 250.4 lb

## 2022-09-21 DIAGNOSIS — J9611 Chronic respiratory failure with hypoxia: Secondary | ICD-10-CM | POA: Diagnosis not present

## 2022-09-21 DIAGNOSIS — Z6835 Body mass index (BMI) 35.0-35.9, adult: Secondary | ICD-10-CM | POA: Diagnosis not present

## 2022-09-21 DIAGNOSIS — Z5986 Financial insecurity: Secondary | ICD-10-CM | POA: Diagnosis not present

## 2022-09-21 DIAGNOSIS — Z79624 Long term (current) use of inhibitors of nucleotide synthesis: Secondary | ICD-10-CM | POA: Diagnosis not present

## 2022-09-21 DIAGNOSIS — I447 Left bundle-branch block, unspecified: Secondary | ICD-10-CM | POA: Diagnosis not present

## 2022-09-21 DIAGNOSIS — H9042 Sensorineural hearing loss, unilateral, left ear, with unrestricted hearing on the contralateral side: Secondary | ICD-10-CM | POA: Diagnosis not present

## 2022-09-21 DIAGNOSIS — N179 Acute kidney failure, unspecified: Secondary | ICD-10-CM | POA: Diagnosis not present

## 2022-09-21 DIAGNOSIS — Z79899 Other long term (current) drug therapy: Secondary | ICD-10-CM | POA: Insufficient documentation

## 2022-09-21 DIAGNOSIS — I272 Pulmonary hypertension, unspecified: Secondary | ICD-10-CM | POA: Diagnosis not present

## 2022-09-21 DIAGNOSIS — I130 Hypertensive heart and chronic kidney disease with heart failure and stage 1 through stage 4 chronic kidney disease, or unspecified chronic kidney disease: Secondary | ICD-10-CM | POA: Insufficient documentation

## 2022-09-21 DIAGNOSIS — I5032 Chronic diastolic (congestive) heart failure: Secondary | ICD-10-CM

## 2022-09-21 DIAGNOSIS — R0683 Snoring: Secondary | ICD-10-CM

## 2022-09-21 DIAGNOSIS — G4733 Obstructive sleep apnea (adult) (pediatric): Secondary | ICD-10-CM

## 2022-09-21 DIAGNOSIS — N1832 Chronic kidney disease, stage 3b: Secondary | ICD-10-CM | POA: Diagnosis not present

## 2022-09-21 DIAGNOSIS — Z9981 Dependence on supplemental oxygen: Secondary | ICD-10-CM | POA: Diagnosis not present

## 2022-09-21 DIAGNOSIS — E1122 Type 2 diabetes mellitus with diabetic chronic kidney disease: Secondary | ICD-10-CM | POA: Diagnosis not present

## 2022-09-21 DIAGNOSIS — Z7984 Long term (current) use of oral hypoglycemic drugs: Secondary | ICD-10-CM | POA: Insufficient documentation

## 2022-09-21 DIAGNOSIS — F1010 Alcohol abuse, uncomplicated: Secondary | ICD-10-CM | POA: Diagnosis not present

## 2022-09-21 DIAGNOSIS — E785 Hyperlipidemia, unspecified: Secondary | ICD-10-CM | POA: Diagnosis not present

## 2022-09-21 DIAGNOSIS — Z87891 Personal history of nicotine dependence: Secondary | ICD-10-CM | POA: Diagnosis not present

## 2022-09-21 LAB — BASIC METABOLIC PANEL
Anion gap: 8 (ref 5–15)
BUN: 40 mg/dL — ABNORMAL HIGH (ref 8–23)
CO2: 30 mmol/L (ref 22–32)
Calcium: 9.5 mg/dL (ref 8.9–10.3)
Chloride: 102 mmol/L (ref 98–111)
Creatinine, Ser: 1.69 mg/dL — ABNORMAL HIGH (ref 0.61–1.24)
GFR, Estimated: 45 mL/min — ABNORMAL LOW (ref >60)
Glucose, Bld: 103 mg/dL — ABNORMAL HIGH (ref 70–99)
Potassium: 4.6 mmol/L (ref 3.5–5.1)
Sodium: 140 mmol/L (ref 135–145)

## 2022-09-21 LAB — BRAIN NATRIURETIC PEPTIDE: B Natriuretic Peptide: 44.3 pg/mL (ref 0.0–100.0)

## 2022-09-21 MED ORDER — EMPAGLIFLOZIN 10 MG PO TABS: 10.0000 mg | ORAL_TABLET | Freq: Every day | ORAL | 3 refills | Status: DC

## 2022-09-21 MED ORDER — CARVEDILOL 12.5 MG PO TABS: 12.5000 mg | ORAL_TABLET | Freq: Two times a day (BID) | ORAL | 3 refills | Status: DC

## 2022-09-21 NOTE — Progress Notes (Signed)
HEART & VASCULAR TRANSITION OF CARE CONSULT NOTE     Referring Physician: Dr. Verlon Au  Primary Care: [name], FNP Primary Cardiologist: Evalina Field, MD   HPI: Referred to clinic by Dr. Verlon Au for heart failure consultation.   62 y/o AAM w/ HFpEF, obesity, OSA, mild PH, mild AS, LBBB,  HLD and sensorineural hearing loss (left ear), wears hearing aid.  Also w/ newly diagnosed HIV, now on Biktarvy. Followed by ID  (Dr. Candiss Norse)  Sleep study 2015 + for severe OSA, AHI 41.5. Untreated, does not have CPAP at home.   Echo 2020 EF 65-70%, RV nl.   Recent admit 12/23 for acute hypoxic respiratory failure due to a/c HF. Echo 50-55% + severe asymmetrical septal hypertrophy with otherwise moderate LVH of other wall segments. No dynamic LVOT gradient. + calcification and thickening of the AoV. RV normal. Diuresed w/ IV Lasix. Transitioned to PO torsemide, 40 mg daily. Jardiance added.  Coreg and losartan continued. D/c'ed home on supp O2 (2L/min). Referred to [facility] clinic. D/c wt 260 lb.   He had his initial TOC clinic f/u scheduled for 1/9 but had to reschedule. Since then, he was seen in the ED on 1/13 for left wrist pain and swelling. X-ray with no fracture. Uric acid not checked but ED notes outline that he was treated for presumed gout and given Rx for steroids and colchicine. Labs in the ED showed AKI w/ bump in SCr from b/l  of 1.4>>1.95. K was ok at 4.1   He presents today for f/u. C/w on 2L [place]. O2 sats 97%. C/w NYHA Class II symptoms. Wt down 250 lb. LEE resolved. Denies CP. BP elevated, 132/102. Reports full med compliance. Wrist pain improved.   Unclear if anyone in his family had HF but states mother had ICD or PPM.   Overall health literacy felt to be poor.   Cardiac Testing   2D Echo 12/23  1. Severe asymmetrical septal hypertrophy with otherwise moderate LVH of  other wall segments. No dynamic LVOT gradient. . Left ventricular ejection  fraction, by estimation, is 50  to 55%. The left ventricle has low normal  function. The left ventricle has   no regional wall motion abnormalities. There is severe asymmetric left  ventricular hypertrophy of the septal segment. Left ventricular diastolic  parameters are consistent with Grade I diastolic dysfunction (impaired  relaxation). Elevated left atrial  pressure.   2. Right ventricular systolic function is normal. The right ventricular  size is normal. Tricuspid regurgitation signal is inadequate for assessing  PA pressure.   3. The mitral valve is abnormal. Mild mitral valve regurgitation. No  evidence of mitral stenosis.   4. The aortic valve is tricuspid. There is mild calcification of the  aortic valve. There is mild thickening of the aortic valve. Aortic valve  regurgitation is not visualized. No aortic stenosis is present.   5. The inferior vena cava is normal in size with greater than 50%  respiratory variability, suggesting right atrial pressure of 3 mmHg.    Review of Systems: [y] = yes, [ ]  = no   General: Weight gain [ ] ; Weight loss [ ] ; Anorexia [ ] ; Fatigue [ ] ; Fever [ ] ; Chills [ ] ; Weakness [ ]   Cardiac: Chest pain/pressure [ ] ; Resting SOB [ ] ; Exertional SOB [Y ]; Orthopnea [ ] ; Pedal Edema [ ] ; Palpitations [ ] ; Syncope [ ] ; Presyncope [ ] ; Paroxysmal nocturnal dyspnea[ ]   Pulmonary: Cough [ ] ; Wheezing[ ] ;  Hemoptysis[ ] ; Sputum [ ] ; Snoring [ ]   GI: Vomiting[ ] ; Dysphagia[ ] ; Melena[ ] ; Hematochezia [ ] ; Heartburn[ ] ; Abdominal pain [ ] ; Constipation [ ] ; Diarrhea [ ] ; BRBPR [ ]   GU: Hematuria[ ] ; Dysuria [ ] ; Nocturia[ ]   Vascular: Pain in legs with walking [ ] ; Pain in feet with lying flat [ ] ; Non-healing sores [ ] ; Stroke [ ] ; TIA [ ] ; Slurred speech [ ] ;  Neuro: Headaches[ ] ; Vertigo[ ] ; Seizures[ ] ; Paresthesias[ ] ;Blurred vision [ ] ; Diplopia [ ] ; Vision changes [ ]   Ortho/Skin: Arthritis [ ] ; Joint pain [Y ]; Muscle pain [ ] ; Joint swelling [ ] ; Back Pain [ ] ; Rash [ ]   Psych:  Depression[ ] ; Anxiety[ ]   Heme: Bleeding problems [ ] ; Clotting disorders [ ] ; Anemia [ ]   Endocrine: Diabetes [ ] ; Thyroid dysfunction[ ]    Past Medical History:  Diagnosis Date   Abnormal lung function test    a. Reported to possibly be COPD, but per pulm note: "Possible COPD - PFT was more suggestive of restrictive defect and diffusion defect likely from obesity and CHF"   Chronic diastolic heart failure ([place]) 05/07/2018   CKD (chronic kidney disease), stage III ([place])    Diabetes mellitus type 2 in obese [facility])    Elevated troponin 05/07/2018   Hearing impaired    Hyperlipidemia    Hypertension    LBBB (left bundle branch block)    LVH (left ventricular hypertrophy)    Mild aortic stenosis    Mild pulmonary hypertension (HCC)    Morbid obesity (HCC)    Sleep apnea     Current Outpatient Medications  Medication Sig Dispense Refill   atorvastatin (LIPITOR) 20 MG tablet TAKE 1 TABLET BY MOUTH EVERY DAY 90 tablet 0   bictegravir-emtricitabine-tenofovir AF (BIKTARVY) 50-200-25 MG TABS tablet Take 1 tablet by mouth daily. Try to take at the same time each day with or without food. 30 tablet 2   carvedilol (COREG) 12.5 MG tablet Take 1 tablet (12.5 mg total) by mouth 2 (two) times daily. 60 tablet 3   HYDROcodone-acetaminophen (NORCO/VICODIN) 5-325 MG tablet Take 2 tablets by mouth every 6 (six) hours as needed for severe pain. 10 tablet 0   losartan (COZAAR) 100 MG tablet Take 1 tablet (100 mg total) by mouth daily. 30 tablet 1   metFORMIN (GLUCOPHAGE) 1000 MG tablet TAKE 1 TABLET (1,000 MG TOTAL) BY MOUTH TWICE A DAY WITH FOOD 180 tablet 1   torsemide (DEMADEX) 20 MG tablet TAKE 2 TABLETS (40 MG TOTAL) BY MOUTH DAILY. 180 tablet 1   empagliflozin (JARDIANCE) 10 MG TABS tablet Take 1 tablet (10 mg total) by mouth daily. 30 tablet 3   No current facility-administered medications for this encounter.    No Known Allergies    Social History   Socioeconomic History   Marital status:  Divorced    Spouse name: Not on file   Number of children: 2   Years of education: Not on file   Highest education level: Not on file  Occupational History   Occupation: self-employed    Comment: recieves disability  Tobacco Use   Smoking status: Former    Years: 1.00    Types: Cigarettes    Quit date: 1999    Years since quitting: 25.0   Smokeless tobacco: Never   Tobacco comments:    1 pack would last one month--08/01/18  Vaping Use   Vaping Use: Never used  Substance and Sexual Activity  Alcohol use: No   Drug use: No   Sexual activity: Not on file  Other Topics Concern   Not on file  Social History Narrative   Not on file   Social Determinants of Health   Financial Resource Strain: Medium Risk (09/21/2022)   Overall Financial Resource Strain (CARDIA)    Difficulty of Paying Living Expenses: Somewhat hard  Food Insecurity: No Food Insecurity (09/21/2022)   Hunger Vital Sign    Worried About Running Out of Food in the Last Year: Never true    Ran Out of Food in the Last Year: Never true  Transportation Needs: No Transportation Needs (09/21/2022)   PRAPARE - Administrator, Civil Service (Medical): No    Lack of Transportation (Non-Medical): No  Physical Activity: Inactive (04/08/2022)   Exercise Vital Sign    Days of Exercise per Week: 0 days    Minutes of Exercise per Session: 0 min  Stress: No Stress Concern Present (04/08/2022)   [employer] of Occupational Health - Occupational Stress Questionnaire    Feeling of Stress : Not at all  Social Connections: Not on file  Intimate Partner Violence: Not At Risk (08/21/2022)   Humiliation, Afraid, Rape, and Kick questionnaire    Fear of Current or Ex-Partner: No    Emotionally Abused: No    Physically Abused: No    Sexually Abused: No      Family History  Problem Relation Age of Onset   Hypertension Mother    Hypertension Father     Vitals:   09/21/22 1109  BP: (!) 132/102  Pulse: 75  Resp:  (!) 99  Weight: 113.6 kg (250 lb 6.4 oz)    PHYSICAL EXAM: General:  Well appearing, moderately obese. No respiratory difficulty HEENT: normal Neck: supple. Thick neck JVD not well visualized. Carotids 2+ bilat; no bruits. No lymphadenopathy or thryomegaly appreciated. Cor: PMI nondisplaced. Regular rate & rhythm. No rubs, gallops or murmurs. Lungs: clear Abdomen: obese, soft, nontender, nondistended. No hepatosplenomegaly. No bruits or masses. Good bowel sounds. Extremities: no cyanosis, clubbing, rash, edema Neuro: alert & oriented x 3, cranial nerves grossly intact. moves all 4 extremities w/o difficulty. Affect pleasant.  ECG: Not performed    ASSESSMENT & PLAN:  1. Chronic Diastolic Heart Failure  - Echo 12/23 EF 50-55% + severe asymmetrical septal hypertrophy with otherwise moderate LVH of other wall segments. No dynamic LVOT gradient. + calcification and thickening of the AoV. RV normal.  - given cardiac anatomy seen in echo + concomitant AoV thinking/ mild stenosis and conduction abnormality w/ LBBB, + sensorineural hearing loss, ? Possible infiltrative CM such as TTR amyloid, though this may just be HTN CM  - Check SPEP, UPEP, Multiple Myeloma and PYP scan  - NYHA Class II, Appears euvolemic on exam. Continue Torsemide 40 mg daily. Check BMP and BNP today  - Continue Jardiance 10 mg daily  - Continue Losartan 100 mg daily  - Needs better BP control, increase Coreg to 12.5 mg bid  - w/ recent AKI and SCr of 1.95, will hold off on transition to [facility]/ addition of MRA for now  - If SCr improves, will consider next visit ARN/MRA at next visit  - refer to the [facility] for further w/u to exclude infiltrative CM. If the above w/u returns negative, can continue future f/u w/ gen cardiology   2. Hypertension  - diastolic BP elevated - increase Coreg to 12.5 mg bid  - continue Losartan 100 mg, Consider  future transition to [facility] pending renal fx - check BMP today   2. Chronic  Hypoxic Respiratory Failure - on home O2  - suspect continued OSA, needs repeat sleep study (see below)   3. OSA - untreated, sleep study in 2015 showed severe OSA w/ AHI 41.5  - discussed association between untreated OSA and CV disease/ HTN - he will need repeat sleep study. Will arrange   4. Obesity  - Body mass index is 35.93 kg/m. - wt loss advised   5. CKD IIIb w/ Recent AKI  - b/l SCr ~1.4 - noted bump in SCr to 1.95 in ED on 1/13 - check BMP today   6. HLD - LDL elevated, 110 mg/dL - on atorvastatin 20  - followed by cardiology   7. HIV - new diagnosis - now followed by ID, Dr. Thedore Mins  - now on Biktarvy    NYHA II GDMT  Diuretic- Torsemide 40 mg daily  BB- Coreg 12.5 mg bid  Ace/ARB/ARNI Losartan 100 mg daily (Consider Entresto next visit pending Renal Fx) MRA consider next visit pending renal fx  SGLT2i Jardiance 10 mg daily     Referred to HFSW (PCP, Medications, Transportation, ETOH Abuse, Drug Abuse, Insurance, Surveyor, quantity ): yes  Refer to Pharmacy:  No Refer to Home Health: No Refer to Advanced Heart Failure Clinic: Yes  Refer to General Cardiology: Yes (shared Care)  Follow up w/ Dr. Wandra Arthurs after PYP scan in 4 wks

## 2022-09-21 NOTE — Patient Instructions (Addendum)
Medication Changes:  CHANGE: coreg. Increase Coreg to 12.5mg , two times a day.   Lab Work:  Labs done today, we will contact you for abnormal readings.  Testing/Procedures:  Your provider has ordered a PYP scan to be completed. Non-Cardiac CT scanning, (CAT scanning), is a noninvasive, special x-ray that produces cross-sectional images of the body using x-rays and a computer. CT scans help physicians diagnose and treat medical conditions. For some CT exams, a contrast material is used to enhance visibility in the area of the body being studied. CT scans provide greater clarity and reveal more details than regular x-ray exams. The Radiology department will call you to schedule this appointment after it has been approved by your insurance company.   Your physician has recommended that you have a sleep study. This test records several body functions during sleep, including: brain activity, eye movement, oxygen and carbon dioxide blood levels, heart rate and rhythm, breathing rate and rhythm, the flow of air through your mouth and nose, snoring, body muscle movements, and chest and belly movement.The order has been placed, the sleep lab will call you to scheduled you appointment, once approved by insurance.     Referrals:  You have been referred to the [facility] and Dr. Daniel Nones. See below to view your upcoming appointment.   Special Instructions // Education:  Do the following things EVERYDAY: Weigh yourself in the morning before breakfast. Write it down and keep it in a log. Take your medicines as prescribed Eat low salt foods--Limit salt (sodium) to 2000 mg per day.  Stay as active as you can everyday Limit all fluids for the day to less than 2 liters  Follow-Up in: 4 weeks with Dr. Daniel Nones.      At the [facility], you and your health needs are our priority. We have a designated team specialized in the treatment of Heart Failure. This Care  Team includes your primary Heart Failure Specialized Cardiologist (physician), Advanced Practice Providers (APPs- Physician Assistants and Nurse Practitioners), and Pharmacist who all work together to provide you with the care you need, when you need it.   You may see any of the following providers on your designated Care Team at your next follow up:  Dr. Glori Bickers Dr. Loralie Champagne Dr. Roxana Hires, NP Lyda Jester, [place] [facility] [place], [place] Forestine Na, NP Audry Riles, PharmD   Please be sure to bring in all your medications bottles to every appointment.   Need to Contact us:  If you have any questions or concerns before your next appointment please send [place] a message through [place] or call our office at [phone].    TO LEAVE A MESSAGE FOR THE NURSE SELECT OPTION 2, PLEASE LEAVE A MESSAGE INCLUDING: YOUR NAME DATE OF BIRTH CALL BACK NUMBER REASON FOR CALL**this is important as we prioritize the call backs  YOU WILL RECEIVE A CALL BACK THE SAME DAY AS LONG AS YOU CALL BEFORE 4:00 PM

## 2022-09-22 ENCOUNTER — Other Ambulatory Visit: Payer: Self-pay | Admitting: Nurse Practitioner

## 2022-09-22 DIAGNOSIS — G4733 Obstructive sleep apnea (adult) (pediatric): Secondary | ICD-10-CM

## 2022-09-23 DIAGNOSIS — G4733 Obstructive sleep apnea (adult) (pediatric): Secondary | ICD-10-CM | POA: Diagnosis not present

## 2022-09-23 DIAGNOSIS — M1030 Gout due to renal impairment, unspecified site: Secondary | ICD-10-CM | POA: Diagnosis not present

## 2022-09-23 DIAGNOSIS — I5033 Acute on chronic diastolic (congestive) heart failure: Secondary | ICD-10-CM | POA: Diagnosis not present

## 2022-09-23 DIAGNOSIS — I447 Left bundle-branch block, unspecified: Secondary | ICD-10-CM | POA: Diagnosis not present

## 2022-09-23 DIAGNOSIS — I272 Pulmonary hypertension, unspecified: Secondary | ICD-10-CM | POA: Diagnosis not present

## 2022-09-23 DIAGNOSIS — N183 Chronic kidney disease, stage 3 unspecified: Secondary | ICD-10-CM | POA: Diagnosis not present

## 2022-09-23 DIAGNOSIS — E1122 Type 2 diabetes mellitus with diabetic chronic kidney disease: Secondary | ICD-10-CM | POA: Diagnosis not present

## 2022-09-23 DIAGNOSIS — I130 Hypertensive heart and chronic kidney disease with heart failure and stage 1 through stage 4 chronic kidney disease, or unspecified chronic kidney disease: Secondary | ICD-10-CM | POA: Diagnosis not present

## 2022-09-23 DIAGNOSIS — I350 Nonrheumatic aortic (valve) stenosis: Secondary | ICD-10-CM | POA: Diagnosis not present

## 2022-09-24 ENCOUNTER — Telehealth: Payer: Self-pay | Admitting: Licensed Clinical Social Worker

## 2022-09-24 LAB — MULTIPLE MYELOMA PANEL, SERUM
Albumin SerPl Elph-Mcnc: 3.6 g/dL (ref 2.9–4.4)
Albumin/Glob SerPl: 1.1 (ref 0.7–1.7)
Alpha 1: 0.2 g/dL (ref 0.0–0.4)
Alpha2 Glob SerPl Elph-Mcnc: 0.8 g/dL (ref 0.4–1.0)
B-Globulin SerPl Elph-Mcnc: 1 g/dL (ref 0.7–1.3)
Gamma Glob SerPl Elph-Mcnc: 1.4 g/dL (ref 0.4–1.8)
Globulin, Total: 3.4 g/dL (ref 2.2–3.9)
IgA: 252 mg/dL (ref 61–437)
IgG (Immunoglobin G), Serum: 1401 mg/dL (ref 603–1613)
IgM (Immunoglobulin M), Srm: 27 mg/dL (ref 20–172)
Total Protein ELP: 7 g/dL (ref 6.0–8.5)

## 2022-09-24 NOTE — Patient Outreach (Signed)
  Care Coordination   Initial Visit Note   09/24/2022 Name: RAYMEL CULL MRN: [id number] DOB: 1961/07/17  [name] is a 62 y.o. year old male who sees [name], [place] for primary care. I spoke with  [name] by phone today.  What matters to the patients health and wellness today?  Care Coordination    Goals Addressed             This Visit's Progress    Obtain Supportive Services/Management of MH Conditions   On track    Care Coordination Interventions: Solution-Focused Strategies employed:  Active listening / Reflection utilized  Emotional Support Provided Verbalization of feelings encouraged  LCSW informed patient of care coordination services. Pt is interested at this time and agreed to schedule initial appt with LCSW         SDOH assessments and interventions completed:  No     Care Coordination Interventions:  Yes, provided   Follow up plan: Follow up call scheduled for 1 week    Encounter Outcome:  Pt. Visit Completed   Christa See, MSW, [email or web address] Phone [phone] 5:55 PM

## 2022-09-24 NOTE — Patient Instructions (Signed)
Visit Information  Thank you for taking time to visit with me today. Please don't hesitate to contact me if I can be of assistance to you.   Following are the goals we discussed today:   Goals Addressed             This Visit's Progress    Obtain Supportive Services/Management of MH Conditions   On track    Care Coordination Interventions: Solution-Focused Strategies employed:  Active listening / Reflection utilized  Emotional Support Provided Verbalization of feelings encouraged  LCSW informed patient of care coordination services. Pt is interested at this time and agreed to schedule initial appt with LCSW         Our next appointment is by telephone on 09/30/22 at 11 AM  Please call the care guide team at [phone] if you need to cancel or reschedule your appointment.   If you are experiencing a Mental Health or [place] or need someone to talk to, please call the Suicide and Crisis Lifeline: 988 call 911   The patient verbalized understanding of instructions, educational materials, and care plan provided today and DECLINED offer to receive copy of patient instructions, educational materials, and care plan.   Christa See, MSW, [email or web address] Phone [phone] 5:55 PM

## 2022-09-28 ENCOUNTER — Encounter: Payer: Self-pay | Admitting: Internal Medicine

## 2022-09-28 ENCOUNTER — Other Ambulatory Visit: Payer: Self-pay

## 2022-09-28 ENCOUNTER — Ambulatory Visit: Payer: Medicare HMO | Admitting: Internal Medicine

## 2022-09-28 VITALS — BP 130/84 | HR 80 | Temp 98.3°F | Wt 248.0 lb

## 2022-09-28 DIAGNOSIS — B20 Human immunodeficiency virus [HIV] disease: Secondary | ICD-10-CM

## 2022-09-28 DIAGNOSIS — Z23 Encounter for immunization: Secondary | ICD-10-CM

## 2022-09-28 NOTE — Progress Notes (Signed)
[place] for Infectious Disease        HPI:  Mark [name] is a 62 y.o. male recently Dx HIV. He was hospitalized in December for HF exacerbation found to e HIV+ 12/26 cd4 574, VL 15.1K. Pt reports he was sexually active about four years ago. With ex wife. Reports that wife was tested and states it was negative.  Today:09/28/22, he is tolerating biktarvy, no missed doses.  ON [place]. 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  Date of diagnosis 12/23 ART exposure none Past OIs Risk factors: heterosexual Social: Occupation: Art gallery manager Housing: apt by himself Support: has family, none know about HIV diagnosis Understanding of HIV: poor Etoh/drug/tobacco use: no/no/no Past Medical History:  Diagnosis Date   Abnormal lung function test    a. Reported to possibly be COPD, but per pulm note: "Possible COPD - PFT was more suggestive of restrictive defect and diffusion defect likely from obesity and CHF"   Chronic diastolic heart failure ([place]) 05/07/2018   CKD (chronic kidney disease), stage III ([place])    Diabetes mellitus type 2 in obese [facility])    Elevated troponin 05/07/2018   Hearing impaired    Hyperlipidemia    Hypertension    LBBB (left bundle branch block)    LVH (left ventricular hypertrophy)    Mild aortic stenosis    Mild pulmonary hypertension (HCC)    Morbid obesity (HCC)    Sleep apnea     Past Surgical History:  Procedure Laterality Date   BACK SURGERY     TRACHEOSTOMY      Family History  Problem Relation Age of Onset   Hypertension Mother    Hypertension Father    Current Outpatient Medications on File Prior to Visit  Medication Sig Dispense Refill   atorvastatin (LIPITOR) 20 MG tablet TAKE 1 TABLET BY MOUTH EVERY DAY 90 tablet 0   bictegravir-emtricitabine-tenofovir AF (BIKTARVY) 50-200-25 MG TABS tablet Take 1 tablet by mouth daily. Try to take at the same time each day with or without food. 30 tablet 2   carvedilol (COREG) 12.5 MG  tablet Take 1 tablet (12.5 mg total) by mouth 2 (two) times daily. 60 tablet 3   empagliflozin (JARDIANCE) 10 MG TABS tablet Take 1 tablet (10 mg total) by mouth daily. 30 tablet 3   HYDROcodone-acetaminophen (NORCO/VICODIN) 5-325 MG tablet Take 2 tablets by mouth every 6 (six) hours as needed for severe pain. 10 tablet 0   losartan (COZAAR) 100 MG tablet Take 1 tablet (100 mg total) by mouth daily. 30 tablet 1   metFORMIN (GLUCOPHAGE) 1000 MG tablet TAKE 1 TABLET (1,000 MG TOTAL) BY MOUTH TWICE A DAY WITH FOOD 180 tablet 1   torsemide (DEMADEX) 20 MG tablet TAKE 2 TABLETS (40 MG TOTAL) BY MOUTH DAILY. 180 tablet 1   No current facility-administered medications on file prior to visit.    No Known Allergies   Lab Results HIV 1 RNA Quant (copies/mL)  Date Value  09/09/2022 5,490 (H)  08/25/2022 15,100   CD4 T Cell Abs (/uL)  Date Value  09/09/2022 334 (L)  08/25/2022 574   No results found for: "HIV1GENOSEQ" Lab Results  Component Value Date   WBC 4.8 09/12/2022   HGB 14.2 09/12/2022   HCT 46.3 09/12/2022   MCV 94.1 09/12/2022   PLT 208 09/12/2022    Lab Results  Component Value Date   CREATININE 1.69 (H) 09/21/2022  BUN 40 (H) 09/21/2022   NA 140 09/21/2022   K 4.6 09/21/2022   CL 102 09/21/2022   CO2 30 09/21/2022   Lab Results  Component Value Date   ALT 14 09/09/2022   AST 13 09/09/2022   ALKPHOS 87 08/22/2022   BILITOT 0.9 09/09/2022    Lab Results  Component Value Date   CHOL 184 09/09/2022   TRIG 162 (H) 09/09/2022   HDL 46 09/09/2022   LDLCALC 110 (H) 09/09/2022   Lab Results  Component Value Date   HAV Reactive (A) 08/25/2022   Lab Results  Component Value Date   HEPBSAG NON REACTIVE 08/25/2022   No results found for: "HCVAB" Lab Results  Component Value Date   CHLAMYDIAWP Negative 09/09/2022   N Negative 09/09/2022   No results found for: "GCPROBEAPT" No results found for: "QUANTGOLD"  Plan #HIV, newly Dx(12/23) not on ART - 12/24  HIV+ 15.1 K VL, cd4 574. 1/9 labs VL 5490, cd4 334. Seen in clinic on 1/10 and started on biktarvy,  -Continue biktarvy, pt reortss 100% adherence -HIV labs,  uirne gc an d UA, F/U in one month    #Vaccination COVID bososter 04/13/22 Flu 04/13/22 Monkeypox PCV 20 04/13/22 Meningitis-needs HepA-immune  HEpB(sab nr, sag nr, ab Nr), needs hbv vaccine-1st dose today 09/28/22 Tdap unk Shingles needs   #Health maintenance -Quantiferon order today 09/09/21 -RPR NR 08/26/22 -HCV NR 08/23/22 -Oral  GC NR on 1/10, o -Colonoscopy-refer to GI for colnoscopy(referred in the past)  Laurice Record, MD [place] for Infectious Disease [place]   I have personally spent 35 minutes involved in face-to-face and non-face-to-face activities for this patient on the day of the visit. Professional time spent includes the following activities: Preparing to see the patient (review of tests), Obtaining and/or reviewing separately obtained history (admission/discharge record), Performing a medically appropriate examination and/or evaluation , Ordering medications/tests/procedures, referring and communicating with other health care professionals, Documenting clinical information in the EMR, Independently interpreting results (not separately reported), Communicating results to the patient/family/caregiver, Counseling and educating the patient/family/caregiver and Care coordination (not separately reported).

## 2022-09-29 LAB — T-HELPER CELLS (CD4) COUNT (NOT AT ARMC)
CD4 % Helper T Cell: 55 % (ref 33–65)
CD4 T Cell Abs: 552 /uL (ref 400–1790)

## 2022-09-30 ENCOUNTER — Ambulatory Visit: Payer: Self-pay | Admitting: Licensed Clinical Social Worker

## 2022-09-30 LAB — COMPLETE METABOLIC PANEL WITH GFR
AG Ratio: 1.4 (calc) (ref 1.0–2.5)
ALT: 14 U/L (ref 9–46)
AST: 10 U/L (ref 10–35)
Albumin: 4.1 g/dL (ref 3.6–5.1)
Alkaline phosphatase (APISO): 99 U/L (ref 35–144)
BUN/Creatinine Ratio: 23 (calc) — ABNORMAL HIGH (ref 6–22)
BUN: 37 mg/dL — ABNORMAL HIGH (ref 7–25)
CO2: 33 mmol/L — ABNORMAL HIGH (ref 20–32)
Calcium: 9.9 mg/dL (ref 8.6–10.3)
Chloride: 100 mmol/L (ref 98–110)
Creat: 1.61 mg/dL — ABNORMAL HIGH (ref 0.70–1.35)
Globulin: 2.9 g/dL (calc) (ref 1.9–3.7)
Glucose, Bld: 97 mg/dL (ref 65–99)
Potassium: 4.4 mmol/L (ref 3.5–5.3)
Sodium: 142 mmol/L (ref 135–146)
Total Bilirubin: 0.5 mg/dL (ref 0.2–1.2)
Total Protein: 7 g/dL (ref 6.1–8.1)
eGFR: 48 mL/min/{1.73_m2} — ABNORMAL LOW (ref >=60)

## 2022-09-30 LAB — CBC WITH DIFFERENTIAL/PLATELET
Absolute Monocytes: 435 cells/uL (ref 200–950)
Basophils Absolute: 41 cells/uL (ref 0–200)
Basophils Relative: 0.7 %
Eosinophils Absolute: 99 cells/uL (ref 15–500)
Eosinophils Relative: 1.7 %
HCT: 45.6 % (ref 38.5–50.0)
Hemoglobin: 14.9 g/dL (ref 13.2–17.1)
Lymphs Abs: 1125 cells/uL (ref 850–3900)
MCH: 29.1 pg (ref 27.0–33.0)
MCHC: 32.7 g/dL (ref 32.0–36.0)
MCV: 89.1 fL (ref 80.0–100.0)
MPV: 11.8 fL (ref 7.5–12.5)
Monocytes Relative: 7.5 %
Neutro Abs: 4101 cells/uL (ref 1500–7800)
Neutrophils Relative %: 70.7 %
Platelets: 160 10*3/uL (ref 140–400)
RBC: 5.12 10*6/uL (ref 4.20–5.80)
RDW: 14.5 % (ref 11.0–15.0)
Total Lymphocyte: 19.4 %
WBC: 5.8 10*3/uL (ref 3.8–10.8)

## 2022-09-30 LAB — HIV-1 RNA QUANT-NO REFLEX-BLD
HIV 1 RNA Quant: NOT DETECTED Copies/mL
HIV-1 RNA Quant, Log: NOT DETECTED Log cps/mL

## 2022-10-02 NOTE — Patient Outreach (Signed)
  Care Coordination Late Entry  Initial Visit Note   Outreach completed 09/30/22 Name: Mark [name] MRN: [id number] DOB: 1960/11/20  Mark [name] is a 62 y.o. year old male who sees [name], [place] for primary care. I spoke with  Mark [name] by phone today.  What matters to the patients health and wellness today?  Care Coordination    Goals Addressed             This Visit's Progress    COMPLETED: Obtain Supportive Services/Management of MH Conditions   On track    Care Coordination Interventions: Solution-Focused Strategies employed:  Active listening / Reflection utilized  Emotional Support Provided Verbalization of feelings encouraged  Patient denies needing assistance managing symptoms of depression or anxiety  States he is managing chronic health conditions well Patient was assessed for SDOH stressors. He has stable housing and drives to appts Patient reports compliance with med management Patient will continue participating in services with RNCM, to assist with management of chronic health conditions LCSW encouraged patient to contact her, should any needs arise         SDOH assessments and interventions completed:  No     Care Coordination Interventions:  Yes, provided   Follow up plan: No further intervention required.   Encounter Outcome:  Pt. Visit Completed   Christa See, MSW, [email or web address] Phone [phone] 5:35 AM

## 2022-10-02 NOTE — Patient Instructions (Signed)
Visit Information  Thank you for taking time to visit with me today. Please don't hesitate to contact me if I can be of assistance to you.   Following are the goals we discussed today:   Goals Addressed             This Visit's Progress    COMPLETED: Obtain Supportive Services/Management of MH Conditions   On track    Care Coordination Interventions: Solution-Focused Strategies employed:  Active listening / Reflection utilized  Emotional Support Provided Verbalization of feelings encouraged  Patient denies needing assistance managing symptoms of depression or anxiety  States he is managing chronic health conditions well Patient was assessed for SDOH stressors. He has stable housing and drives to appts Patient reports compliance with med management Patient will continue participating in services with RNCM, to assist with management of chronic health conditions LCSW encouraged patient to contact her, should any needs arise         If you are experiencing a Mental Health or [place] or need someone to talk to, please call the Suicide and Crisis Lifeline: 988 call 911   The patient verbalized understanding of instructions, educational materials, and care plan provided today and DECLINED offer to receive copy of patient instructions, educational materials, and care plan.   No further follow up required:    Christa See, MSW, [email or web address] Phone [phone] 5:35 AM

## 2022-10-07 DIAGNOSIS — I130 Hypertensive heart and chronic kidney disease with heart failure and stage 1 through stage 4 chronic kidney disease, or unspecified chronic kidney disease: Secondary | ICD-10-CM | POA: Diagnosis not present

## 2022-10-07 DIAGNOSIS — I447 Left bundle-branch block, unspecified: Secondary | ICD-10-CM | POA: Diagnosis not present

## 2022-10-07 DIAGNOSIS — N183 Chronic kidney disease, stage 3 unspecified: Secondary | ICD-10-CM | POA: Diagnosis not present

## 2022-10-07 DIAGNOSIS — I5033 Acute on chronic diastolic (congestive) heart failure: Secondary | ICD-10-CM | POA: Diagnosis not present

## 2022-10-07 DIAGNOSIS — I272 Pulmonary hypertension, unspecified: Secondary | ICD-10-CM | POA: Diagnosis not present

## 2022-10-07 DIAGNOSIS — E1122 Type 2 diabetes mellitus with diabetic chronic kidney disease: Secondary | ICD-10-CM | POA: Diagnosis not present

## 2022-10-07 DIAGNOSIS — G4733 Obstructive sleep apnea (adult) (pediatric): Secondary | ICD-10-CM | POA: Diagnosis not present

## 2022-10-07 DIAGNOSIS — M1030 Gout due to renal impairment, unspecified site: Secondary | ICD-10-CM | POA: Diagnosis not present

## 2022-10-07 DIAGNOSIS — I350 Nonrheumatic aortic (valve) stenosis: Secondary | ICD-10-CM | POA: Diagnosis not present

## 2022-10-13 ENCOUNTER — Other Ambulatory Visit (HOSPITAL_COMMUNITY): Payer: Self-pay

## 2022-10-13 ENCOUNTER — Ambulatory Visit: Payer: Medicare HMO | Admitting: Internal Medicine

## 2022-10-13 ENCOUNTER — Telehealth: Payer: Self-pay

## 2022-10-13 ENCOUNTER — Other Ambulatory Visit: Payer: Self-pay

## 2022-10-13 NOTE — Telephone Encounter (Signed)
[facility] Patient Advocate Encounter   I was successful in securing patient a $5,000.00 grant from [employer] [place] (PAF) to provide copayment coverage for Biktarvy.  This will make the out of pocket cost $0.00.     I have spoken with the patient.    The billing information is as follows and has been shared with [facility].            Patient knows to call the office with questions or concerns.  Ileene Patrick, [facility] Patient [facility] Phone: [phone] Fax:  [phone]

## 2022-10-15 ENCOUNTER — Other Ambulatory Visit: Payer: Self-pay

## 2022-10-15 ENCOUNTER — Ambulatory Visit (INDEPENDENT_AMBULATORY_CARE_PROVIDER_SITE_OTHER): Payer: Medicare HMO | Admitting: Nurse Practitioner

## 2022-10-15 ENCOUNTER — Encounter: Payer: Self-pay | Admitting: Nurse Practitioner

## 2022-10-15 VITALS — BP 118/70 | HR 97 | Ht 70.0 in | Wt 244.5 lb

## 2022-10-15 DIAGNOSIS — I5020 Unspecified systolic (congestive) heart failure: Secondary | ICD-10-CM

## 2022-10-15 DIAGNOSIS — J9611 Chronic respiratory failure with hypoxia: Secondary | ICD-10-CM

## 2022-10-15 DIAGNOSIS — I5033 Acute on chronic diastolic (congestive) heart failure: Secondary | ICD-10-CM | POA: Diagnosis not present

## 2022-10-15 DIAGNOSIS — E669 Obesity, unspecified: Secondary | ICD-10-CM | POA: Diagnosis not present

## 2022-10-15 DIAGNOSIS — G4733 Obstructive sleep apnea (adult) (pediatric): Secondary | ICD-10-CM | POA: Diagnosis not present

## 2022-10-15 DIAGNOSIS — I350 Nonrheumatic aortic (valve) stenosis: Secondary | ICD-10-CM | POA: Diagnosis not present

## 2022-10-15 DIAGNOSIS — N183 Chronic kidney disease, stage 3 unspecified: Secondary | ICD-10-CM | POA: Diagnosis not present

## 2022-10-15 DIAGNOSIS — E1122 Type 2 diabetes mellitus with diabetic chronic kidney disease: Secondary | ICD-10-CM | POA: Diagnosis not present

## 2022-10-15 DIAGNOSIS — M1030 Gout due to renal impairment, unspecified site: Secondary | ICD-10-CM | POA: Diagnosis not present

## 2022-10-15 DIAGNOSIS — I272 Pulmonary hypertension, unspecified: Secondary | ICD-10-CM | POA: Diagnosis not present

## 2022-10-15 DIAGNOSIS — R0683 Snoring: Secondary | ICD-10-CM

## 2022-10-15 DIAGNOSIS — I130 Hypertensive heart and chronic kidney disease with heart failure and stage 1 through stage 4 chronic kidney disease, or unspecified chronic kidney disease: Secondary | ICD-10-CM | POA: Diagnosis not present

## 2022-10-15 DIAGNOSIS — I447 Left bundle-branch block, unspecified: Secondary | ICD-10-CM | POA: Diagnosis not present

## 2022-10-15 MED ORDER — CARVEDILOL 12.5 MG PO TABS: 12.5000 mg | ORAL_TABLET | Freq: Two times a day (BID) | ORAL | 0 refills | Status: DC

## 2022-10-15 NOTE — Progress Notes (Signed)
$@PatientR$  ID: Mark [name], male    DOB: [birth year], 62 y.o.   MRN: [id number]  Chief Complaint  Patient presents with   Consult    Pt sleep consult he states that he does snore, he states that he feels fine when he wakes up in the morning     Referring provider: [name], FNP  HPI: 62 year old male, former smoker referred for sleep consult.  Past medical history significant for CHF, hypertension, LBBB, cardiomegaly, PAD, chronic hypoxic respiratory failure, DM 2, CKD stage III, HLD, vitamin D deficiency, obesity, OSA, HIV.  TEST/EVENTS:  10/25/2013 NPSG: AHI 41.5/h 05/11/2018 PFT: FVC 58, FEV1 59, ratio 81, TLC 69, DLCOcor 58.  No BD 08/22/2022 echocardiogram: EF 50 to 55%.  G1 DD.  LVH.  RV size and function is normal.  Mild MR.  10/16/2022: Today-sleep consult Patient presents today for sleep consult referred by [name], NP.  He has had more problems with his heart recently and was hospitalized in December 2023 for acute hypoxemic respiratory failure secondary to HFrEF and pulmonary hypertension.  He was encouraged to seek evaluation due to his history of severe OSA and not currently being treated on CPAP therapy.  He is also continued to require oxygen therapy since discharge.  Currently using 2 L/min supplemental O2 with portable tanks.  Has an oxygen concentrator at home.  Has not noticed any low levels at home.  He has been following with cardiology for management of his heart failure symptoms.  Feels like he has improved since he was admitted.  Activity tolerance is better; still gets short winded with longer distances and especially climbing.  Wants to do what ever he needs to to get better.  He does not have any pulmonary history.  He was a former remote smoker.  He had previous pulmonary function testing in 2019 without evidence of obstruction.  He did have a restrictive defect which was felt to be related to his weight and CHF.  He has never used any inhalers in the  past. He tells me today that he has been told that he snores and has had witnessed apneas in the past.  He actually does not feel as tired as he did back in 2015 when he had his original sleep study.  During that time, he would fall asleep just talking to people.  Now he feels like he can go throughout the day without falling asleep but his stamina is still not the best.  He is always attributed this to his heart.  He denies any morning headaches, drowsy driving, sleep parasomnia/paralysis.  No symptoms of narcolepsy or cataplexy.  He would be willing to use PAP therapy if that was what was required to help get him better.  Fluid status today is overall stable. He goes to sleep around 9:30 PM to 10 PM.  Falls asleep within 20 minutes.  Wakes around 3 times a night.  Usually sleeps on his side.  Officially gets up about 7 AM.  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  Used to work as a Art gallery manager.  The only heavy machinery he operates is his car.  Weight has fluctuated over the last 2 years.  Last sleep study was in 2015 with severe sleep apnea.  Never started on CPAP therapy after this.  Does not take any sleep aids. He has had a history of high blood pressure, heart failure, diabetes.  No history of stroke or heart rhythm problems.  He  quit smoking in 2019.  A pack usually lasted him about a month.  He also quit drinking alcohol in 2019.  No excessive caffeine intake or illicit drug use.  Lives alone.  No significant family history.  Epworth 3   No Known Allergies  Immunization History  Administered Date(s) Administered   Hepb-cpg 09/28/2022   Influenza,inj,Quad PF,6+ Mos 06/18/2021, 04/13/2022   Influenza-Unspecified 04/13/2022   PFIZER(Purple Top)SARS-COV-2 Vaccination 02/01/2020, 02/22/2020, 08/20/2020   PNEUMOCOCCAL CONJUGATE-20 08/24/2022   Pfizer Covid-19 Vaccine Bivalent Booster 41yr & up 06/18/2021, 04/13/2022    Past Medical History:  Diagnosis Date   Abnormal lung function test     a. Reported to possibly be COPD, but per pulm note: "Possible COPD - PFT was more suggestive of restrictive defect and diffusion defect likely from obesity and CHF"   Chronic diastolic heart failure ([place] 05/07/2018   CKD (chronic kidney disease), stage III ([facility] [place]    Diabetes mellitus type 2 in obese ([facility]    Elevated troponin 05/07/2018   Hearing impaired    Hyperlipidemia    Hypertension    LBBB (left bundle branch block)    LVH (left ventricular hypertrophy)    Mild aortic stenosis    Mild pulmonary hypertension (HCC)    Morbid obesity (HCC)    Sleep apnea     Tobacco History: Social History   Tobacco Use  Smoking Status Former   Years: 1.00   Types: Cigarettes   Quit date: 1999   Years since quitting: 25.1  Smokeless Tobacco Never  Tobacco Comments   1 pack would last one month--08/01/18   Counseling given: Not Answered Tobacco comments: 1 pack would last one month--08/01/18   Outpatient Medications Prior to Visit  Medication Sig Dispense Refill   atorvastatin (LIPITOR) 20 MG tablet TAKE 1 TABLET BY MOUTH EVERY DAY 90 tablet 0   bictegravir-emtricitabine-tenofovir AF (BIKTARVY) 50-200-25 MG TABS tablet Take 1 tablet by mouth daily. Try to take at the same time each day with or without food. 30 tablet 2   empagliflozin (JARDIANCE) 10 MG TABS tablet Take 1 tablet (10 mg total) by mouth daily. 30 tablet 3   HYDROcodone-acetaminophen (NORCO/VICODIN) 5-325 MG tablet Take 2 tablets by mouth every 6 (six) hours as needed for severe pain. 10 tablet 0   levothyroxine (SYNTHROID) 25 MCG tablet Take 25 mcg by mouth daily before breakfast.     losartan (COZAAR) 100 MG tablet Take 1 tablet (100 mg total) by mouth daily. 30 tablet 1   metFORMIN (GLUCOPHAGE) 1000 MG tablet TAKE 1 TABLET (1,000 MG TOTAL) BY MOUTH TWICE A DAY WITH FOOD 180 tablet 1   torsemide (DEMADEX) 20 MG tablet TAKE 2 TABLETS (40 MG TOTAL) BY MOUTH DAILY. 180 tablet 1   carvedilol (COREG) 12.5 MG tablet Take 1 tablet  (12.5 mg total) by mouth 2 (two) times daily. 60 tablet 3   No facility-administered medications prior to visit.     Review of Systems:   Constitutional: No weight loss or gain, night sweats, fevers, chills, or lassitude. +occasional fatigue/decreased stamina HEENT: No headaches, difficulty swallowing, tooth/dental problems, or sore throat. No sneezing, itching, ear ache, nasal congestion, or post nasal drip CV:  +swelling in lower extremities (occasional; stable). No chest pain, orthopnea, PND, anasarca, dizziness, palpitations, syncope Resp: +snoring; shortness of breath with exertion. No excess mucus or change in color of mucus. No productive or non-productive. No hemoptysis. No wheezing.  No chest wall deformity GI:  No heartburn, indigestion, abdominal pain, nausea,  vomiting, diarrhea, change in bowel habits, loss of appetite, bloody stools.  GU: No dysuria, change in color of urine, urgency or frequency.  Skin: No rash, lesions, ulcerations MSK:  No joint pain or swelling.   No back pain. Neuro: No dizziness or lightheadedness.  Psych: No depression or anxiety. Mood stable.     Physical Exam:  BP 118/70   Pulse 97   Ht 5' 10"$  (1.778 m)   Wt 244 lb 8 oz (110.9 kg)   SpO2 96% Comment: 2L-3L  BMI 35.08 kg/m   GEN: Pleasant, interactive, chronically-ill appearing; obese; in no acute distress. HEENT:  Normocephalic and atraumatic. PERRLA. Sclera white. Nasal turbinates pink, moist and patent bilaterally. No rhinorrhea present. Oropharynx pink and moist, without exudate or edema. No lesions, ulcerations, or postnasal drip. Mallampati III NECK:  Supple w/ fair ROM. No JVD present. Normal carotid impulses w/o bruits. Thyroid symmetrical with no goiter or nodules palpated. No lymphadenopathy.   CV: RRR, no m/r/g, no peripheral edema. Pulses intact, +2 bilaterally. No cyanosis, pallor or clubbing. PULMONARY:  Unlabored, regular breathing. Clear bilaterally A&P w/o  wheezes/rales/rhonchi. No accessory muscle use.  GI: BS present and normoactive. Soft, non-tender to palpation. No organomegaly or masses detected. MSK: No erythema, warmth or tenderness. Cap refil <2 sec all extrem. No deformities or joint swelling noted.  Neuro: A/Ox3. No focal deficits noted.   Skin: Warm, no lesions or rashe Psych: Normal affect and behavior. Judgement and thought content appropriate.     Lab Results:  CBC    Component Value Date/Time   WBC 5.8 09/28/2022 0927   RBC 5.12 09/28/2022 0927   HGB 14.9 09/28/2022 0927   HGB 15.1 03/07/2020 1202   HCT 45.6 09/28/2022 0927   HCT 44.4 03/07/2020 1202   PLT 160 09/28/2022 0927   PLT 202 03/07/2020 1202   MCV 89.1 09/28/2022 0927   MCV 92 03/07/2020 1202   MCH 29.1 09/28/2022 0927   MCHC 32.7 09/28/2022 0927   RDW 14.5 09/28/2022 0927   RDW 13.7 03/07/2020 1202   LYMPHSABS 1,125 09/28/2022 0927   MONOABS 0.6 09/12/2022 0259   EOSABS 99 09/28/2022 0927   BASOSABS 41 09/28/2022 0927    BMET    Component Value Date/Time   NA 142 09/28/2022 0927   NA 145 (H) 01/05/2022 1546   K 4.4 09/28/2022 0927   CL 100 09/28/2022 0927   CO2 33 (H) 09/28/2022 0927   GLUCOSE 97 09/28/2022 0927   BUN 37 (H) 09/28/2022 0927   BUN 14 01/05/2022 1546   CREATININE 1.61 (H) 09/28/2022 0927   CALCIUM 9.9 09/28/2022 0927   GFRNONAA 45 (L) 09/21/2022 1157   GFRAA 60 09/23/2020 1037    BNP    Component Value Date/Time   BNP 44.3 09/21/2022 1205     Imaging:  No results found.       Latest Ref Rng & Units 05/11/2018   10:13 AM  PFT Results  FVC-Pre L 2.46   FVC-Predicted Pre % 58   FVC-Post L 2.25   FVC-Predicted Post % 53   Pre FEV1/FVC % % 79   Post FEV1/FCV % % 81   FEV1-Pre L 1.94   FEV1-Predicted Pre % 59   FEV1-Post L 1.84   DLCO uncorrected ml/min/mmHg 19.38   DLCO UNC% % 57   DLCO corrected ml/min/mmHg 19.67   DLCO COR %Predicted % 58   DLVA Predicted % 105   TLC L 5.03   TLC % Predicted %  69    RV % Predicted % 99     No results found for: "NITRICOXIDE"      Assessment & Plan:   OSA (obstructive sleep apnea) He has snoring, daytime fatigue, history of nocturnal apneic events. BMI 35. History of severe OSA, CHF. Given this,  I am concerned he still has sleep disordered breathing with obstructive sleep apnea. With his oxygen requirements and CHF, he will need an in lab sleep study for further evaluation. Split night ordered today with instructions to transition to CPAP or BiPAP.   - discussed how weight can impact sleep and risk for sleep disordered breathing - discussed options to assist with weight loss: combination of diet modification, cardiovascular and strength training exercises   - had an extensive discussion regarding the adverse health consequences related to untreated sleep disordered breathing - specifically discussed the risks for hypertension, coronary artery disease, cardiac dysrhythmias, cerebrovascular disease, and diabetes - lifestyle modification discussed   - discussed how sleep disruption can increase risk of accidents, particularly when driving - safe driving practices were discussed  Patient Instructions  Given your symptoms and history, I am concerned that you still have sleep apnea. You will need an in lab study for further evaluation. Someone will contact you to schedule this.  We discussed how untreated sleep apnea puts an individual at risk for cardiac arrhthymias, pulm HTN, DM, stroke and increases their risk for daytime accidents. We also briefly reviewed treatment options including weight loss, side sleeping position, oral appliance, CPAP therapy or referral to ENT for possible surgical options  Use caution when driving and pull over if you become sleepy   Start to use supplemental oxygen 2 lpm on POC with activity. Continue 2 lpm continuous at night with your home concentrator. Goal >88-90% Orders sent to your medical supply company   Follow  up in 4-6 weeks with Dr. Ander Slade (new pt 30 min slot). If symptoms worsen, please contact office for sooner follow up or seek emergency care.    HFrEF (heart failure with reduced ejection fraction) ([place]) Euvolemic on exam. Follow up with cardiology as scheduled  Chronic respiratory failure with hypoxia ([place]) Likely multifactorial related to HFrEF, untreated severe OSA, and potentially OHS. See above plan. He has not qualified for POC yet. He would like to see about getting something more lightweight/portable. He was walked today on POC and able to maintain saturations >88-90% on 2 lpm. We will place orders today. Advised him to monitor oxygen at home.   Obesity (BMI 30-39.9) BMI 35. Healthy weight loss measures encouraged. Reviewed correlation between OSA and obesity.   I spent 45 minutes of dedicated to the care of this patient on the date of this encounter to include pre-visit review of records, face-to-face time with the patient discussing conditions above, post visit ordering of testing, clinical documentation with the electronic health record, making appropriate referrals as documented, and communicating necessary findings to members of the patients care team.  Clayton Bibles, NP 10/16/2022  Pt aware and understands NP's role.

## 2022-10-15 NOTE — Patient Instructions (Addendum)
Given your symptoms and history, I am concerned that you still have sleep apnea. You will need an in lab study for further evaluation. Someone will contact you to schedule this.  We discussed how untreated sleep apnea puts an individual at risk for cardiac arrhthymias, pulm HTN, DM, stroke and increases their risk for daytime accidents. We also briefly reviewed treatment options including weight loss, side sleeping position, oral appliance, CPAP therapy or referral to ENT for possible surgical options  Use caution when driving and pull over if you become sleepy   Start to use supplemental oxygen 2 lpm on POC with activity. Continue 2 lpm continuous at night with your home concentrator. Goal >88-90% Orders sent to your medical supply company   Follow up in 4-6 weeks with Mark [name] (new pt 30 min slot). If symptoms worsen, please contact office for sooner follow up or seek emergency care.

## 2022-10-16 ENCOUNTER — Encounter: Payer: Self-pay | Admitting: Nurse Practitioner

## 2022-10-16 ENCOUNTER — Ambulatory Visit: Payer: Self-pay

## 2022-10-16 DIAGNOSIS — J9611 Chronic respiratory failure with hypoxia: Secondary | ICD-10-CM | POA: Insufficient documentation

## 2022-10-16 DIAGNOSIS — I5020 Unspecified systolic (congestive) heart failure: Secondary | ICD-10-CM | POA: Insufficient documentation

## 2022-10-16 NOTE — Assessment & Plan Note (Signed)
Likely multifactorial related to HFrEF, untreated severe OSA, and potentially OHS. See above plan. He has not qualified for POC yet. He would like to see about getting something more lightweight/portable. He was walked today on POC and able to maintain saturations >88-90% on 2 lpm. We will place orders today. Advised him to monitor oxygen at home.

## 2022-10-16 NOTE — Assessment & Plan Note (Signed)
BMI 35. Healthy weight loss measures encouraged. Reviewed correlation between OSA and obesity.

## 2022-10-16 NOTE — Assessment & Plan Note (Signed)
Euvolemic on exam. Follow up with cardiology as scheduled

## 2022-10-16 NOTE — Patient Outreach (Signed)
  Care Coordination   Follow Up Visit Note   10/16/2022 Name: Mark [name] MRN: [id number] DOB: 27-Feb-1961  Mark [name] is a 62 y.o. year old male who sees [name], [place] for primary care. I spoke with  Mark [name] by phone today.  What matters to the patients health and wellness today?  Patient will  undergo a sleep study for evaluation of OSA.     Goals Addressed             This Visit's Progress    To complete a sleep study to diagnose obstructive sleep apnea       Care Coordination Interventions: Evaluation of current treatment plan related to OSA and patient's adherence to plan as established by provider Determined patient completed a follow up with [facility] at [facility] Review of patient status, including review of consultant's reports, relevant laboratory and other test results Educated patient about OSA, including treatment and use of CPAP machine Educated patient about the referral process for the sleep study and this may take several weeks pending insurance authorization  Determined patient verbalizes understanding of his prescribed treatment plan and next steps for evaluation and treatment of OSA  Reviewed scheduled/upcoming provider appointments including: next Cardiology follow up appointment scheduled for 10/19/22 @9$ :40 PM; new patient appointment with [facility], Dr. Ander Slade @10$  AM            SDOH assessments and interventions completed:  No     Care Coordination Interventions:  Yes, provided   Follow up plan: Follow up call scheduled for 11/13/22 @09$ :30 AM    Encounter Outcome:  Pt. Visit Completed

## 2022-10-16 NOTE — Assessment & Plan Note (Signed)
He has snoring, daytime fatigue, history of nocturnal apneic events. BMI 35. History of severe OSA, CHF. Given this,  I am concerned he still has sleep disordered breathing with obstructive sleep apnea. With his oxygen requirements and CHF, he will need an in lab sleep study for further evaluation. Split night ordered today with instructions to transition to CPAP or BiPAP.   - discussed how weight can impact sleep and risk for sleep disordered breathing - discussed options to assist with weight loss: combination of diet modification, cardiovascular and strength training exercises   - had an extensive discussion regarding the adverse health consequences related to untreated sleep disordered breathing - specifically discussed the risks for hypertension, coronary artery disease, cardiac dysrhythmias, cerebrovascular disease, and diabetes - lifestyle modification discussed   - discussed how sleep disruption can increase risk of accidents, particularly when driving - safe driving practices were discussed  Patient Instructions  Given your symptoms and history, I am concerned that you still have sleep apnea. You will need an in lab study for further evaluation. Someone will contact you to schedule this.  We discussed how untreated sleep apnea puts an individual at risk for cardiac arrhthymias, pulm HTN, DM, stroke and increases their risk for daytime accidents. We also briefly reviewed treatment options including weight loss, side sleeping position, oral appliance, CPAP therapy or referral to ENT for possible surgical options  Use caution when driving and pull over if you become sleepy   Start to use supplemental oxygen 2 lpm on POC with activity. Continue 2 lpm continuous at night with your home concentrator. Goal >88-90% Orders sent to your medical supply company   Follow up in 4-6 weeks with Dr. Ander Slade (new pt 30 min slot). If symptoms worsen, please contact office for sooner follow up or seek  emergency care.

## 2022-10-16 NOTE — Patient Instructions (Signed)
Visit Information  Thank you for taking time to visit with me today. Please don't hesitate to contact me if I can be of assistance to you.   Following are the goals we discussed today:   Goals Addressed             This Visit's Progress    To complete a sleep study to diagnose obstructive sleep apnea       Care Coordination Interventions: Evaluation of current treatment plan related to OSA and patient's adherence to plan as established by provider Determined patient completed a follow up with [facility] at [facility] Review of patient status, including review of consultant's reports, relevant laboratory and other test results Educated patient about OSA, including treatment and use of CPAP machine Educated patient about the referral process for the sleep study and this may take several weeks pending insurance authorization  Determined patient verbalizes understanding of his prescribed treatment plan and next steps for evaluation and treatment of OSA  Reviewed scheduled/upcoming provider appointments including: next Cardiology follow up appointment scheduled for 10/19/22 @9$ :40 PM; new patient appointment with [facility], Dr. Ander Slade @10$  AM            Our next appointment is by telephone on 11/13/22 @09$ :30 AM   Please call the care guide team at [phone] if you need to cancel or reschedule your appointment.   If you are experiencing a Mental Health or [place] or need someone to talk to, please call 1-800-273-TALK (toll free, 24 hour hotline) go to [name] [address] [place] [phone])  Patient verbalizes understanding of instructions and care plan provided today and agrees to view in [place]. Active MyChart status and patient understanding of how to access instructions and care plan via MyChart confirmed with patient.     Barb Merino, RN, BSN, CCM Care Management  Coordinator [facility] Care Management  Direct Phone: [phone]

## 2022-10-19 ENCOUNTER — Encounter (HOSPITAL_COMMUNITY): Payer: Self-pay | Admitting: Cardiology

## 2022-10-19 ENCOUNTER — Ambulatory Visit (HOSPITAL_COMMUNITY)
Admission: RE | Admit: 2022-10-19 | Discharge: 2022-10-19 | Disposition: A | Discharge status: Home / Self Care | Payer: Medicare HMO | Source: Ambulatory Visit | Attending: Cardiology | Admitting: Cardiology

## 2022-10-19 ENCOUNTER — Other Ambulatory Visit (HOSPITAL_COMMUNITY): Payer: Self-pay

## 2022-10-19 VITALS — BP 118/78 | HR 66 | Wt 242.0 lb

## 2022-10-19 DIAGNOSIS — I130 Hypertensive heart and chronic kidney disease with heart failure and stage 1 through stage 4 chronic kidney disease, or unspecified chronic kidney disease: Secondary | ICD-10-CM | POA: Diagnosis not present

## 2022-10-19 DIAGNOSIS — Z7984 Long term (current) use of oral hypoglycemic drugs: Secondary | ICD-10-CM | POA: Diagnosis not present

## 2022-10-19 DIAGNOSIS — N1832 Chronic kidney disease, stage 3b: Secondary | ICD-10-CM | POA: Diagnosis not present

## 2022-10-19 DIAGNOSIS — Z79624 Long term (current) use of inhibitors of nucleotide synthesis: Secondary | ICD-10-CM | POA: Diagnosis not present

## 2022-10-19 DIAGNOSIS — I5032 Chronic diastolic (congestive) heart failure: Secondary | ICD-10-CM | POA: Insufficient documentation

## 2022-10-19 DIAGNOSIS — Z21 Asymptomatic human immunodeficiency virus [HIV] infection status: Secondary | ICD-10-CM | POA: Insufficient documentation

## 2022-10-19 DIAGNOSIS — I10 Essential (primary) hypertension: Secondary | ICD-10-CM

## 2022-10-19 DIAGNOSIS — Z6835 Body mass index (BMI) 35.0-35.9, adult: Secondary | ICD-10-CM | POA: Diagnosis not present

## 2022-10-19 DIAGNOSIS — E785 Hyperlipidemia, unspecified: Secondary | ICD-10-CM | POA: Insufficient documentation

## 2022-10-19 DIAGNOSIS — B20 Human immunodeficiency virus [HIV] disease: Secondary | ICD-10-CM | POA: Diagnosis not present

## 2022-10-19 DIAGNOSIS — E1122 Type 2 diabetes mellitus with diabetic chronic kidney disease: Secondary | ICD-10-CM | POA: Insufficient documentation

## 2022-10-19 DIAGNOSIS — Z87891 Personal history of nicotine dependence: Secondary | ICD-10-CM | POA: Diagnosis not present

## 2022-10-19 DIAGNOSIS — Z79899 Other long term (current) drug therapy: Secondary | ICD-10-CM | POA: Diagnosis not present

## 2022-10-19 DIAGNOSIS — Z9981 Dependence on supplemental oxygen: Secondary | ICD-10-CM | POA: Insufficient documentation

## 2022-10-19 DIAGNOSIS — G4733 Obstructive sleep apnea (adult) (pediatric): Secondary | ICD-10-CM | POA: Diagnosis not present

## 2022-10-19 LAB — COMPREHENSIVE METABOLIC PANEL
ALT: 14 U/L (ref 0–44)
AST: 15 U/L (ref 15–41)
Albumin: 3.5 g/dL (ref 3.5–5.0)
Alkaline Phosphatase: 91 U/L (ref 38–126)
Anion gap: 11 (ref 5–15)
BUN: 33 mg/dL — ABNORMAL HIGH (ref 8–23)
CO2: 30 mmol/L (ref 22–32)
Calcium: 9.6 mg/dL (ref 8.9–10.3)
Chloride: 102 mmol/L (ref 98–111)
Creatinine, Ser: 1.9 mg/dL — ABNORMAL HIGH (ref 0.61–1.24)
GFR, Estimated: 39 mL/min — ABNORMAL LOW (ref >60)
Glucose, Bld: 93 mg/dL (ref 70–99)
Potassium: 4.9 mmol/L (ref 3.5–5.1)
Sodium: 143 mmol/L (ref 135–145)
Total Bilirubin: 0.6 mg/dL (ref 0.3–1.2)
Total Protein: 7.3 g/dL (ref 6.5–8.1)

## 2022-10-19 LAB — TSH: TSH: 1.439 u[IU]/mL (ref 0.350–4.500)

## 2022-10-19 LAB — BRAIN NATRIURETIC PEPTIDE: B Natriuretic Peptide: 76.4 pg/mL (ref 0.0–100.0)

## 2022-10-19 NOTE — H&P (View-Only) (Signed)
 ADVANCED HEART FAILURE CLINIC NOTE  Referring Physician: Moore, Janece, FNP  Primary Care: Moore, Janece, FNP Primary Cardiologist: Traci Turner  HPI: Mark [name] is a 62 y.o. male w/ HFpEF, severe OSA, recently diagnosed HIV on Biktarvy (followed by Dr. Singh) admitted most recently in December 2023 for acute hypoxic respiratory failure believed to be secondary to acute on chronic HFpEF exacerbation. During that admission he had a TTE w/ LVEF 50-55% and severe asymmetric septal hypertrophy but no dynamic LVOT gradient. He was diuresed with IV lasix, started on GDMT and discharged home. Since that time he has been seen in TOC clinic at where jardiance/losartan were continued and coreg was increased to 12.5mg BID. He has also followed with pulmonology in the interim. Since discharge in 12/23 he has been on chronic 2L home O2 with some improvement in exercise capacity. He had PFTs in 2019 w/o any obstructive disease.   Interval hx:  Over the past several weeks he reports better control of volume status with PO diuretics; improvement in PND; however, continued shortness of breath with excessive physical activity. He believes a lot of his symptoms stem from severe sleep apnea. He reports snoring throughout the night and has been told that he has frequent episodes of apnea.   Activity level/exercise tolerance:  Difficult to assess, NYHA III Orthopnea:  Sleeps on 2-3 pillows Paroxysmal noctural dyspnea:  no Chest pain/pressure:  no Orthostatic lightheadedness:  no Palpitations:  no Lower extremity edema:  yes, improved.  Presyncope/syncope:  no Cough:  no  Past Medical History:  Diagnosis Date   Abnormal lung function test    a. Reported to possibly be COPD, but per pulm note: "Possible COPD - PFT was more suggestive of restrictive defect and diffusion defect likely from obesity and CHF"   Chronic diastolic heart failure (HCC) 05/07/2018   CKD (chronic kidney disease), stage III (HCC)     Diabetes mellitus type 2 in obese (HCC)    Elevated troponin 05/07/2018   Hearing impaired    Hyperlipidemia    Hypertension    LBBB (left bundle branch block)    LVH (left ventricular hypertrophy)    Mild aortic stenosis    Mild pulmonary hypertension (HCC)    Morbid obesity (HCC)    Sleep apnea     Current Outpatient Medications  Medication Sig Dispense Refill   atorvastatin (LIPITOR) 20 MG tablet TAKE 1 TABLET BY MOUTH EVERY DAY 90 tablet 0   bictegravir-emtricitabine-tenofovir AF (BIKTARVY) 50-200-25 MG TABS tablet Take 1 tablet by mouth daily. Try to take at the same time each day with or without food. 30 tablet 2   carvedilol (COREG) 12.5 MG tablet Take 1 tablet (12.5 mg total) by mouth 2 (two) times daily. 180 tablet 0   empagliflozin (JARDIANCE) 10 MG TABS tablet Take 1 tablet (10 mg total) by mouth daily. 30 tablet 3   levothyroxine (SYNTHROID) 25 MCG tablet Take 25 mcg by mouth daily before breakfast.     losartan (COZAAR) 100 MG tablet Take 1 tablet (100 mg total) by mouth daily. 30 tablet 1   metFORMIN (GLUCOPHAGE) 1000 MG tablet TAKE 1 TABLET (1,000 MG TOTAL) BY MOUTH TWICE A DAY WITH FOOD 180 tablet 1   torsemide (DEMADEX) 20 MG tablet TAKE 2 TABLETS (40 MG TOTAL) BY MOUTH DAILY. 180 tablet 1   No current facility-administered medications for this encounter.    No Known Allergies    Social History   Socioeconomic History   Marital status:   Divorced    Spouse name: Not on file   Number of children: 2   Years of education: Not on file   Highest education level: Not on file  Occupational History   Occupation: self-employed    Comment: recieves disability  Tobacco Use   Smoking status: Former    Years: 1.00    Types: Cigarettes    Quit date: 1999    Years since quitting: 25.1   Smokeless tobacco: Never   Tobacco comments:    1 pack would last one month--08/01/18  Vaping Use   Vaping Use: Never used  Substance and Sexual Activity   Alcohol use: No   Drug  use: No   Sexual activity: Not Currently    Partners: Female    Comment: declied condom  Other Topics Concern   Not on file  Social History Narrative   Not on file   Social Determinants of Health   Financial Resource Strain: Medium Risk (09/21/2022)   Overall Financial Resource Strain (CARDIA)    Difficulty of Paying Living Expenses: Somewhat hard  Food Insecurity: No Food Insecurity (09/21/2022)   Hunger Vital Sign    Worried About Running Out of Food in the Last Year: Never true    Ran Out of Food in the Last Year: Never true  Transportation Needs: No Transportation Needs (09/21/2022)   PRAPARE - Transportation    Lack of Transportation (Medical): No    Lack of Transportation (Non-Medical): No  Physical Activity: Inactive (04/08/2022)   Exercise Vital Sign    Days of Exercise per Week: 0 days    Minutes of Exercise per Session: 0 min  Stress: No Stress Concern Present (04/08/2022)   [employer] - Occupational Stress Questionnaire    Feeling of Stress : Not at all  Social Connections: Not on file  Intimate Partner Violence: Not At Risk (08/21/2022)   Humiliation, Afraid, Rape, and Kick questionnaire    Fear of Current or Ex-Partner: No    Emotionally Abused: No    Physically Abused: No    Sexually Abused: No      Family History  Problem Relation Age of Onset   Hypertension Mother    Hypertension Father     PHYSICAL EXAM: Vitals:   10/19/22 0926  BP: 118/78  Pulse: 66  SpO2: 98%   GENERAL: Well nourished, well developed, and in no apparent distress at rest.  HEENT: Negative for arcus senilis or xanthelasma. There is no scleral icterus.  The mucous membranes are pink and moist.   NECK: Supple, No masses. Normal carotid upstrokes without bruits. No masses or thyromegaly.    CHEST: There are no chest wall deformities. There is no chest wall tenderness. Respirations are unlabored.  Lungs- decreased at bases CARDIAC:  JVP: difficult to assess,  appears 7-8cm        Normal S1, S2  Normal rate with regular rhythm. No murmurs, rubs or gallops.  Pulses are 2+ and symmetrical in upper and lower extremities. No edema.  ABDOMEN: Soft, non-tender, non-distended. There are no masses or hepatomegaly. There are normal bowel sounds.  EXTREMITIES: Warm and well perfused with no cyanosis, clubbing.  LYMPHATIC: No axillary or supraclavicular lymphadenopathy.  NEUROLOGIC: Patient is oriented x3 with no focal or lateralizing neurologic deficits.  PSYCH: Patients affect is appropriate, there is no evidence of anxiety or depression.  SKIN: Warm and dry; no lesions or wounds.   DATA REVIEW  ECG: 08/25/22: NSR with wide LBBB, QRSd of   180ms  ECHO: LVEF 55%, severe asymmetric LVH.      Latest Ref Rng & Units 05/11/2018   10:13 AM  PFT Results  FVC-Pre L 2.46   FVC-Predicted Pre % 58   FVC-Post L 2.25   FVC-Predicted Post % 53   Pre FEV1/FVC % % 79   Post FEV1/FCV % % 81   FEV1-Pre L 1.94   FEV1-Predicted Pre % 59   FEV1-Post L 1.84   DLCO uncorrected ml/min/mmHg 19.38   DLCO UNC% % 57   DLCO corrected ml/min/mmHg 19.67   DLCO COR %Predicted % 58   DLVA Predicted % 105   TLC L 5.03   TLC % Predicted % 69   RV % Predicted % 99      ASSESSMENT & PLAN:  Heart failure with preserved EF Etiology of HF:Severe LVH on TTE; although, he has significant HTN, degree of LVH is concerning for infiltrative cardiomyopathy. Will obtain CMR today. PYP scan pending. Myeloma panel previously negative; however, no FLC assay. Will order today.  NYHA class / AHA Stage:IIB Volume status & Diuretics: Euvolemic to mildly hypervolemic Vasodilators:Losartan 100mg daily Beta-Blocker:coreg 12.5mg BID; plan to decrease as he has HFpEF MRA: pending lab work; if sCr is higher will hold off.  Cardiometabolic:Jardiance 10mg Devices therapies & Valvulopathies:CMR today to evaluate for infiltrative cardiomyopathies; if he has HCM will further risk stratify.  Advanced  therapies:Not indicated; poor insight.  He has severe OSA with a new O2 requirement. Plan for echo w/ bubble study to rule out shunting & RHC to evaluate for PH.   2. HTN - D/C losartan - start Entresto 97/103mg BID - Start spironolactone 12.5mg daily pending labs  3. OSA - untreated, sleep study in 2015 showed severe OSA w/ AHI 41.5  - discussed association between untreated OSA and CV disease/ HTN - he will need repeat sleep study. Will arrange  - RHC to evaluate for PH.    4. Obesity  - Body mass index is 35.93 kg/m. - wt loss advised    5. CKD IIIb  - b/l SCr ~1.4 - noted bump in SCr to 1.95 in ED on 1/13 - check BMP today    6. HLD - LDL elevated, 110 mg/dL - on atorvastatin 20  - followed by cardiology    7. HIV - new diagnosis - now followed by ID, Dr. Singh  - now on Biktarvy    Romuald Mccaslin Advanced Heart Failure Mechanical Circulatory Support 

## 2022-10-19 NOTE — Patient Instructions (Signed)
There has been no changes to your medications.  Labs done today, your results will be available in MyChart, we will contact you for abnormal readings.  Your physician has recommended that you have a pulmonary function test. Pulmonary Function Tests are a group of tests that measure how well air moves in and out of your lungs. YOU WILL BE CALLED TO HAVE THIS TEST ARRANGED.  You are scheduled for a Cardiac Catheterization on Friday, March 8 with Dr.  Daniel Nones .  1. Please arrive at the [facility] (Main Entrance A) at [facility]: [address] [place], [place] [address] at 7:00 AM (This time is two hours before your procedure to ensure your preparation). Free valet parking service is available.   Special note: Every effort is made to have your procedure done on time. Please understand that emergencies sometimes delay scheduled procedures.  2. Diet: Do not eat solid foods after midnight.  The patient may have clear liquids until 5am upon the day of the procedure.  3.. Medication instructions in preparation for your procedure:   Contrast Allergy: No  HOLD your Torsemide and Jardiance the Morning of your Procedure.  HOLD YOUR METFORMIN THE DAY OF PROCEDURE AND FOR 2 DAYS AFTER YOUR PROCEDURE.  On the morning of your procedure, take any morning medicines NOT listed above.  You may use sips of water.  5. Plan for one night stay--bring personal belongings. 6. Bring a current list of your medications and current insurance cards. 7. You MUST have a responsible person to drive you home. 8. Someone MUST be with you the first 24 hours after you arrive home or your discharge will be delayed. 9. Please wear clothes that are easy to get on and off and wear slip-on shoes.   Your physician recommends that you schedule a follow-up appointment in: 3 months (May ) ** please call the office in April to arrange your follow up appointment. **  If you have any questions or concerns before your  next appointment please send [place] a message through [place] or call our office at [phone].    TO LEAVE A MESSAGE FOR THE NURSE SELECT OPTION 2, PLEASE LEAVE A MESSAGE INCLUDING: YOUR NAME DATE OF BIRTH CALL BACK NUMBER REASON FOR CALL**this is important as we prioritize the call backs  YOU WILL RECEIVE A CALL BACK THE SAME DAY AS LONG AS YOU CALL BEFORE 4:00 PM  At the [facility], you and your health needs are our priority. As part of our continuing mission to provide you with exceptional heart care, we have created designated Provider Care Teams. These Care Teams include your primary Cardiologist (physician) and Advanced Practice Providers (APPs- Physician Assistants and Nurse Practitioners) who all work together to provide you with the care you need, when you need it.   You may see any of the following providers on your designated Care Team at your next follow up: Dr Glori Bickers Dr Loralie Champagne Dr. Roxana Hires, NP Lyda Jester, [place] [facility] [place], [place] Forestine Na, NP Audry Riles, PharmD   Please be sure to bring in all your medications bottles to every appointment.    Thank you for choosing [facility]

## 2022-10-19 NOTE — Progress Notes (Signed)
ADVANCED HEART FAILURE CLINIC NOTE  Referring Physician: [name], FNP  Primary Care: [name], FNP Primary Cardiologist: Fransico Him  HPI: Mark [name] is a 62 y.o. male w/ HFpEF, severe OSA, recently diagnosed HIV on Biktarvy (followed by Dr. Candiss Norse) admitted most recently in December 2023 for acute hypoxic respiratory failure believed to be secondary to acute on chronic HFpEF exacerbation. During that admission he had a TTE w/ LVEF 50-55% and severe asymmetric septal hypertrophy but no dynamic LVOT gradient. He was diuresed with IV lasix, started on GDMT and discharged home. Since that time he has been seen in [facility] clinic at where jardiance/losartan were continued and coreg was increased to 12.24m BID. He has also followed with pulmonology in the interim. Since discharge in 12/23 he has been on chronic 2L home O2 with some improvement in exercise capacity. He had PFTs in 2019 w/o any obstructive disease.   Interval hx:  Over the past several weeks he reports better control of volume status with PO diuretics; improvement in PND; however, continued shortness of breath with excessive physical activity. He believes a lot of his symptoms stem from severe sleep apnea. He reports snoring throughout the night and has been told that he has frequent episodes of apnea.   Activity level/exercise tolerance:  Difficult to assess, NYHA III Orthopnea:  Sleeps on 2-3 pillows Paroxysmal noctural dyspnea:  no Chest pain/pressure:  no Orthostatic lightheadedness:  no Palpitations:  no Lower extremity edema:  yes, improved.  Presyncope/syncope:  no Cough:  no  Past Medical History:  Diagnosis Date   Abnormal lung function test    a. Reported to possibly be COPD, but per pulm note: "Possible COPD - PFT was more suggestive of restrictive defect and diffusion defect likely from obesity and CHF"   [place] 05/07[place]   [place]     Diabetes mellitus type 2 in obese ([facility]    Elevated troponin 05/07/2018   Hearing impaired    Hyperlipidemia    Hypertension    LBBB (left bundle branch block)    LVH (left ventricular hypertrophy)    Mild aortic stenosis    Mild pulmonary hypertension (HCC)    Morbid obesity (HCC)    Sleep apnea     Current Outpatient Medications  Medication Sig Dispense Refill   atorvastatin (LIPITOR) 20 MG tablet TAKE 1 TABLET BY MOUTH EVERY DAY 90 tablet 0   bictegravir-emtricitabine-tenofovir AF (BIKTARVY) 50-200-25 MG TABS tablet Take 1 tablet by mouth daily. Try to take at the same time each day with or without food. 30 tablet 2   carvedilol (COREG) 12.5 MG tablet Take 1 tablet (12.5 mg total) by mouth 2 (two) times daily. 180 tablet 0   empagliflozin (JARDIANCE) 10 MG TABS tablet Take 1 tablet (10 mg total) by mouth daily. 30 tablet 3   levothyroxine (SYNTHROID) 25 MCG tablet Take 25 mcg by mouth daily before breakfast.     losartan (COZAAR) 100 MG tablet Take 1 tablet (100 mg total) by mouth daily. 30 tablet 1   metFORMIN (GLUCOPHAGE) 1000 MG tablet TAKE 1 TABLET (1,000 MG TOTAL) BY MOUTH TWICE A DAY WITH FOOD 180 tablet 1   torsemide (DEMADEX) 20 MG tablet TAKE 2 TABLETS (40 MG TOTAL) BY MOUTH DAILY. 180 tablet 1   No current facility-administered medications for this encounter.    No Known Allergies    Social History   Socioeconomic History   Marital status:  Divorced    Spouse name: Not on file   Number of children: 2   Years of education: Not on file   Highest education level: Not on file  Occupational History   Occupation: self-employed    Comment: recieves disability  Tobacco Use   Smoking status: Former    Years: 1.00    Types: Cigarettes    Quit date: 1999    Years since quitting: 25.1   Smokeless tobacco: Never   Tobacco comments:    1 pack would last one month--08/01/18  Vaping Use   Vaping Use: Never used  Substance and Sexual Activity   Alcohol use: No   Drug  use: No   Sexual activity: Not Currently    Partners: Female    Comment: declied condom  Other Topics Concern   Not on file  Social History Narrative   Not on file   Social Determinants of Health   Financial Resource Strain: Medium Risk (09/21/2022)   Overall Financial Resource Strain (CARDIA)    Difficulty of Paying Living Expenses: Somewhat hard  Food Insecurity: No Food Insecurity (09/21/2022)   Hunger Vital Sign    Worried About Running Out of Food in the Last Year: Never true    [place] in the Last Year: Never true  Transportation Needs: No Transportation Needs (09/21/2022)   PRAPARE - Hydrologist (Medical): No    Lack of Transportation (Non-Medical): No  Physical Activity: Inactive (04/08/2022)   Exercise Vital Sign    Days of Exercise per Week: 0 days    Minutes of Exercise per Session: 0 min  Stress: No Stress Concern Present (04/08/2022)   [place]    Feeling of Stress : Not at all  Social Connections: Not on file  Intimate Partner Violence: Not At Risk (08/21/2022)   Humiliation, Afraid, Rape, and Kick questionnaire    Fear of Current or Ex-Partner: No    Emotionally Abused: No    Physically Abused: No    Sexually Abused: No      Family History  Problem Relation Age of Onset   Hypertension Mother    Hypertension Father     PHYSICAL EXAM: Vitals:   10/19/22 0926  BP: 118/78  Pulse: 66  SpO2: 98%   GENERAL: Well nourished, well developed, and in no apparent distress at rest.  HEENT: Negative for arcus senilis or xanthelasma. There is no scleral icterus.  The mucous membranes are pink and moist.   NECK: Supple, No masses. Normal carotid upstrokes without bruits. No masses or thyromegaly.    CHEST: There are no chest wall deformities. There is no chest wall tenderness. Respirations are unlabored.  Lungs- decreased at bases CARDIAC:  JVP: difficult to assess,  appears 7-8cm        Normal S1, S2  Normal rate with regular rhythm. No murmurs, rubs or gallops.  Pulses are 2+ and symmetrical in upper and lower extremities. No edema.  ABDOMEN: Soft, non-tender, non-distended. There are no masses or hepatomegaly. There are normal bowel sounds.  EXTREMITIES: Warm and well perfused with no cyanosis, clubbing.  LYMPHATIC: No axillary or supraclavicular lymphadenopathy.  NEUROLOGIC: Patient is oriented x3 with no focal or lateralizing neurologic deficits.  PSYCH: Patients affect is appropriate, there is no evidence of anxiety or depression.  SKIN: Warm and dry; no lesions or wounds.   DATA REVIEW  ECG: 08/25/22: NSR with wide LBBB, QRSd of  112m  ECHO: LVEF 55%, severe asymmetric LVH.      Latest Ref Rng & Units 05/11/2018   10:13 AM  PFT Results  FVC-Pre L 2.46   FVC-Predicted Pre % 58   FVC-Post L 2.25   FVC-Predicted Post % 53   Pre FEV1/FVC % % 79   Post FEV1/FCV % % 81   FEV1-Pre L 1.94   FEV1-Predicted Pre % 59   FEV1-Post L 1.84   DLCO uncorrected ml/min/mmHg 19.38   DLCO UNC% % 57   DLCO corrected ml/min/mmHg 19.67   DLCO COR %Predicted % 58   DLVA Predicted % 105   TLC L 5.03   TLC % Predicted % 69   RV % Predicted % 99      ASSESSMENT & PLAN:  Heart failure with preserved EF Etiology of HYC:[id number] on TTE; although, he has significant HTN, degree of LVH is concerning for infiltrative cardiomyopathy. Will obtain CMR today. PYP scan pending. Myeloma panel previously negative; however, no FLC assay. Will order today.  NYHA class / AHA Stage:IIB Volume status & Diuretics: Euvolemic to mildly hypervolemic Vasodilators:Losartan 1073mdaily Beta-Blocker:coreg 12.57m857mID; plan to decrease as he has HFpEF MRA: pending lab work; if sCr is higher will hold off.  Cardiometabolic:Jardiance [id number] therapies & Valvulopathies:CMR today to evaluate for infiltrative cardiomyopathies; if he has HCM will further risk stratify.  Advanced  therapies:Not indicated; poor insight.  He has severe OSA with a new O2 requirement. Plan for echo w/ bubble study to rule out shunting & RHC to evaluate for PH.   2. HTN - D/C losartan - start Entresto 97/103m24mD - Start spironolactone 12.57mg 14mly pending labs  3. OSA - untreated, sleep study in 2015 showed severe OSA w/ AHI 41.5  - discussed association between untreated OSA and CV disease/ HTN - he will need repeat sleep study. Will arrange  - RHC to evaluate for PH.    4. Obesity  - Body mass index is 35.93 kg/m. - wt loss advised    5. CKD IIIb  - b/l SCr ~1.4 - noted bump in SCr to 1.95 in ED on 1/13 - check BMP today    6. HLD - LDL elevated, 110 mg/dL - on atorvastatin 20  - followed by cardiology    7. HIV - new diagnosis - now followed by ID, [place]. [place] Mechanical Circulatory [place]

## 2022-10-21 ENCOUNTER — Telehealth (HOSPITAL_COMMUNITY): Payer: Self-pay | Admitting: Cardiology

## 2022-10-21 ENCOUNTER — Telehealth (HOSPITAL_COMMUNITY): Payer: Self-pay | Admitting: *Deleted

## 2022-10-21 DIAGNOSIS — I447 Left bundle-branch block, unspecified: Secondary | ICD-10-CM | POA: Diagnosis not present

## 2022-10-21 DIAGNOSIS — N183 Chronic kidney disease, stage 3 unspecified: Secondary | ICD-10-CM | POA: Diagnosis not present

## 2022-10-21 DIAGNOSIS — G4733 Obstructive sleep apnea (adult) (pediatric): Secondary | ICD-10-CM | POA: Diagnosis not present

## 2022-10-21 DIAGNOSIS — I272 Pulmonary hypertension, unspecified: Secondary | ICD-10-CM | POA: Diagnosis not present

## 2022-10-21 DIAGNOSIS — I130 Hypertensive heart and chronic kidney disease with heart failure and stage 1 through stage 4 chronic kidney disease, or unspecified chronic kidney disease: Secondary | ICD-10-CM | POA: Diagnosis not present

## 2022-10-21 DIAGNOSIS — I350 Nonrheumatic aortic (valve) stenosis: Secondary | ICD-10-CM | POA: Diagnosis not present

## 2022-10-21 DIAGNOSIS — I5033 Acute on chronic diastolic (congestive) heart failure: Secondary | ICD-10-CM | POA: Diagnosis not present

## 2022-10-21 DIAGNOSIS — M1030 Gout due to renal impairment, unspecified site: Secondary | ICD-10-CM | POA: Diagnosis not present

## 2022-10-21 DIAGNOSIS — E1122 Type 2 diabetes mellitus with diabetic chronic kidney disease: Secondary | ICD-10-CM | POA: Diagnosis not present

## 2022-10-21 NOTE — Telephone Encounter (Signed)
Pt returned call to report he does not have a support person to pick him up or check in with him post procedure  -Cath Lab aware (Ci-Ci)   -pt aware

## 2022-10-21 NOTE — Telephone Encounter (Signed)
HHRN called to review cath instructions -advised transportation is needed post procedure and contact person (note: pt will be using a cab for arrival)  -if pt is unable to have a support person, he will need to contact office so alternate arrangements can be made  Pt and pts HHRN aware Reports he will look into this and return call if he cannot have family/friend pick him up and be responsible to check on him post procedure x 24 hrs

## 2022-10-22 ENCOUNTER — Other Ambulatory Visit: Payer: Self-pay | Admitting: Cardiology

## 2022-10-23 ENCOUNTER — Other Ambulatory Visit: Payer: Self-pay

## 2022-10-23 DIAGNOSIS — I5032 Chronic diastolic (congestive) heart failure: Secondary | ICD-10-CM

## 2022-10-23 DIAGNOSIS — E1159 Type 2 diabetes mellitus with other circulatory complications: Secondary | ICD-10-CM

## 2022-10-23 MED ORDER — LOSARTAN POTASSIUM 100 MG PO TABS: 100.0000 mg | ORAL_TABLET | Freq: Every day | ORAL | 1 refills | Status: DC

## 2022-10-23 MED ORDER — TORSEMIDE 20 MG PO TABS: 40.0000 mg | ORAL_TABLET | Freq: Every day | ORAL | 1 refills | Status: DC

## 2022-10-23 MED ORDER — METFORMIN HCL 1000 MG PO TABS: ORAL_TABLET | ORAL | 1 refills | Status: DC

## 2022-10-23 MED ORDER — ATORVASTATIN CALCIUM 20 MG PO TABS: 20.0000 mg | ORAL_TABLET | Freq: Every day | ORAL | 1 refills | Status: DC

## 2022-10-23 MED ORDER — CARVEDILOL 12.5 MG PO TABS: 12.5000 mg | ORAL_TABLET | Freq: Two times a day (BID) | ORAL | 0 refills | Status: DC

## 2022-10-28 ENCOUNTER — Other Ambulatory Visit: Payer: Self-pay | Admitting: Nurse Practitioner

## 2022-10-28 DIAGNOSIS — I5032 Chronic diastolic (congestive) heart failure: Secondary | ICD-10-CM

## 2022-11-06 ENCOUNTER — Ambulatory Visit (HOSPITAL_COMMUNITY)
Admission: RE | Admit: 2022-11-06 | Discharge: 2022-11-06 | Disposition: A | Discharge status: Home / Self Care | Payer: Medicare HMO | Attending: Cardiology | Admitting: Cardiology

## 2022-11-06 ENCOUNTER — Other Ambulatory Visit: Payer: Self-pay

## 2022-11-06 ENCOUNTER — Ambulatory Visit (HOSPITAL_BASED_OUTPATIENT_CLINIC_OR_DEPARTMENT_OTHER): Payer: Medicare HMO

## 2022-11-06 ENCOUNTER — Encounter (HOSPITAL_COMMUNITY)
Admission: RE | Disposition: A | Discharge status: Home / Self Care | Payer: Self-pay | Source: Home / Self Care | Attending: Cardiology

## 2022-11-06 DIAGNOSIS — I5032 Chronic diastolic (congestive) heart failure: Secondary | ICD-10-CM | POA: Diagnosis not present

## 2022-11-06 DIAGNOSIS — I447 Left bundle-branch block, unspecified: Secondary | ICD-10-CM | POA: Diagnosis not present

## 2022-11-06 DIAGNOSIS — E669 Obesity, unspecified: Secondary | ICD-10-CM | POA: Diagnosis not present

## 2022-11-06 DIAGNOSIS — I130 Hypertensive heart and chronic kidney disease with heart failure and stage 1 through stage 4 chronic kidney disease, or unspecified chronic kidney disease: Secondary | ICD-10-CM | POA: Insufficient documentation

## 2022-11-06 DIAGNOSIS — Z87891 Personal history of nicotine dependence: Secondary | ICD-10-CM | POA: Diagnosis not present

## 2022-11-06 DIAGNOSIS — I509 Heart failure, unspecified: Secondary | ICD-10-CM | POA: Diagnosis not present

## 2022-11-06 DIAGNOSIS — Z9981 Dependence on supplemental oxygen: Secondary | ICD-10-CM | POA: Insufficient documentation

## 2022-11-06 DIAGNOSIS — G4733 Obstructive sleep apnea (adult) (pediatric): Secondary | ICD-10-CM | POA: Diagnosis not present

## 2022-11-06 DIAGNOSIS — N1832 Chronic kidney disease, stage 3b: Secondary | ICD-10-CM | POA: Diagnosis not present

## 2022-11-06 DIAGNOSIS — E1122 Type 2 diabetes mellitus with diabetic chronic kidney disease: Secondary | ICD-10-CM | POA: Diagnosis not present

## 2022-11-06 DIAGNOSIS — Z21 Asymptomatic human immunodeficiency virus [HIV] infection status: Secondary | ICD-10-CM | POA: Diagnosis not present

## 2022-11-06 DIAGNOSIS — I272 Pulmonary hypertension, unspecified: Secondary | ICD-10-CM | POA: Diagnosis not present

## 2022-11-06 DIAGNOSIS — Z79899 Other long term (current) drug therapy: Secondary | ICD-10-CM | POA: Diagnosis not present

## 2022-11-06 DIAGNOSIS — Z7984 Long term (current) use of oral hypoglycemic drugs: Secondary | ICD-10-CM | POA: Insufficient documentation

## 2022-11-06 DIAGNOSIS — Z6835 Body mass index (BMI) 35.0-35.9, adult: Secondary | ICD-10-CM | POA: Diagnosis not present

## 2022-11-06 DIAGNOSIS — E785 Hyperlipidemia, unspecified: Secondary | ICD-10-CM | POA: Insufficient documentation

## 2022-11-06 DIAGNOSIS — I5033 Acute on chronic diastolic (congestive) heart failure: Secondary | ICD-10-CM | POA: Insufficient documentation

## 2022-11-06 HISTORY — PX: RIGHT HEART CATH: CATH118263

## 2022-11-06 LAB — POCT I-STAT EG7
Acid-Base Excess: 7 mmol/L — ABNORMAL HIGH (ref 0.0–2.0)
Acid-Base Excess: 7 mmol/L — ABNORMAL HIGH (ref 0.0–2.0)
Bicarbonate: 33.3 mmol/L — ABNORMAL HIGH (ref 20.0–28.0)
Bicarbonate: 33.7 mmol/L — ABNORMAL HIGH (ref 20.0–28.0)
Calcium, Ion: 1.25 mmol/L (ref 1.15–1.40)
Calcium, Ion: 1.25 mmol/L (ref 1.15–1.40)
HCT: 36 % — ABNORMAL LOW (ref 39.0–52.0)
HCT: 37 % — ABNORMAL LOW (ref 39.0–52.0)
Hemoglobin: 12.2 g/dL — ABNORMAL LOW (ref 13.0–17.0)
Hemoglobin: 12.6 g/dL — ABNORMAL LOW (ref 13.0–17.0)
O2 Saturation: 57 %
O2 Saturation: 59 %
Potassium: 4.5 mmol/L (ref 3.5–5.1)
Potassium: 4.5 mmol/L (ref 3.5–5.1)
Sodium: 142 mmol/L (ref 135–145)
Sodium: 142 mmol/L (ref 135–145)
TCO2: 35 mmol/L — ABNORMAL HIGH (ref 22–32)
TCO2: 35 mmol/L — ABNORMAL HIGH (ref 22–32)
pCO2, Ven: 54.6 mmHg (ref 44–60)
pCO2, Ven: 55.7 mmHg (ref 44–60)
pH, Ven: 7.39 (ref 7.25–7.43)
pH, Ven: 7.393 (ref 7.25–7.43)
pO2, Ven: 31 mmHg — CL (ref 32–45)
pO2, Ven: 32 mmHg (ref 32–45)

## 2022-11-06 LAB — CBC
HCT: 38.6 % — ABNORMAL LOW (ref 39.0–52.0)
Hemoglobin: 12.6 g/dL — ABNORMAL LOW (ref 13.0–17.0)
MCH: 30.7 pg (ref 26.0–34.0)
MCHC: 32.6 g/dL (ref 30.0–36.0)
MCV: 94.1 fL (ref 80.0–100.0)
Platelets: 161 10*3/uL (ref 150–400)
RBC: 4.1 MIL/uL — ABNORMAL LOW (ref 4.22–5.81)
RDW: 15.1 % (ref 11.5–15.5)
WBC: 5.3 10*3/uL (ref 4.0–10.5)
nRBC: 0 % (ref 0.0–0.2)

## 2022-11-06 LAB — GLUCOSE, CAPILLARY
Glucose-Capillary: 115 mg/dL — ABNORMAL HIGH (ref 70–99)
Glucose-Capillary: 105 mg/dL — ABNORMAL HIGH (ref 70–99)

## 2022-11-06 LAB — ECHOCARDIOGRAM COMPLETE BUBBLE STUDY
Area-P 1/2: 3.17 cm2
Calc EF: 31.2 %
S' Lateral: 4.4 cm
Single Plane A2C EF: 33.8 %
Single Plane A4C EF: 28.9 %

## 2022-11-06 SURGERY — RIGHT HEART CATH
Anesthesia: LOCAL

## 2022-11-06 MED ORDER — SODIUM CHLORIDE 0.9 % IV SOLN: 250.0000 mL | INTRAVENOUS | Status: DC | PRN

## 2022-11-06 MED ORDER — HEPARIN (PORCINE) IN NACL 1000-0.9 UT/500ML-% IV SOLN
INTRAVENOUS | Status: DC | PRN
  Administered 2022-11-06: 500 mL

## 2022-11-06 MED ORDER — ACETAMINOPHEN 325 MG PO TABS: 650.0000 mg | ORAL_TABLET | ORAL | Status: DC | PRN

## 2022-11-06 MED ORDER — LABETALOL HCL 5 MG/ML IV SOLN: 10.0000 mg | INTRAVENOUS | Status: DC | PRN

## 2022-11-06 MED ORDER — SODIUM CHLORIDE 0.9% FLUSH: 3.0000 mL | INTRAVENOUS | Status: DC | PRN

## 2022-11-06 MED ORDER — LIDOCAINE HCL (PF) 1 % IJ SOLN
INTRAMUSCULAR | Status: DC | PRN
  Administered 2022-11-06: 2 mL

## 2022-11-06 MED ORDER — ONDANSETRON HCL 4 MG/2ML IJ SOLN: 4.0000 mg | Freq: Four times a day (QID) | INTRAMUSCULAR | Status: DC | PRN

## 2022-11-06 MED ORDER — LIDOCAINE HCL (PF) 1 % IJ SOLN
INTRAMUSCULAR | Status: AC
  Filled 2022-11-06: qty 30

## 2022-11-06 MED ORDER — SODIUM CHLORIDE 0.9 % IV SOLN: INTRAVENOUS | Status: DC

## 2022-11-06 MED ORDER — HYDRALAZINE HCL 20 MG/ML IJ SOLN: 10.0000 mg | INTRAMUSCULAR | Status: DC | PRN

## 2022-11-06 MED ORDER — SODIUM CHLORIDE 0.9% FLUSH: 3.0000 mL | Freq: Two times a day (BID) | INTRAVENOUS | Status: DC

## 2022-11-06 SURGICAL SUPPLY — 7 items
CATH SWAN GANZ 7F STRAIGHT (CATHETERS) IMPLANT
GLIDESHEATH SLENDER 7FR .021G (SHEATH) IMPLANT
PACK CARDIAC CATHETERIZATION (CUSTOM PROCEDURE TRAY) ×2 IMPLANT
PROTECTION STATION PRESSURIZED (MISCELLANEOUS) ×1
STATION PROTECTION PRESSURIZED (MISCELLANEOUS) IMPLANT
TRANSDUCER W/STOPCOCK (MISCELLANEOUS) ×2 IMPLANT
WIRE EMERALD 3MM-J .025X260CM (WIRE) IMPLANT

## 2022-11-06 NOTE — Interval H&P Note (Signed)
History and Physical Interval Note:  11/06/2022 9:16 AM  Mark [name]  has presented today for surgery, with the diagnosis of CHF.  The various methods of treatment have been discussed with the patient and family. After consideration of risks, benefits and other options for treatment, the patient has consented to  Procedure(s): RIGHT HEART CATH (N/A) as a surgical intervention.  The patient's history has been reviewed, patient examined, no change in status, stable for surgery.  I have reviewed the patient's chart and labs.  Questions were answered to the patient's satisfaction.     [name]

## 2022-11-06 NOTE — Progress Notes (Signed)
  Echocardiogram 2D Echocardiogram has been performed.  Mark [name] 11/06/2022, 10:53 AM

## 2022-11-06 NOTE — Discharge Instructions (Signed)
Patient can remove dressing In 24 hours and then you can shower. After you get out of shower, you don't have to put anything over the site (Bandages, ointments or cream's). Watch for signs of infection(Fever,redness,drainage etc..) or bleeding. If so, please call your Doctor. Do not lift anything heavier than 10lbs or submerge site in water for one week.

## 2022-11-09 ENCOUNTER — Encounter (HOSPITAL_COMMUNITY): Payer: Self-pay | Admitting: Cardiology

## 2022-11-09 ENCOUNTER — Other Ambulatory Visit: Payer: Self-pay

## 2022-11-09 ENCOUNTER — Ambulatory Visit: Payer: Medicare HMO | Admitting: Internal Medicine

## 2022-11-09 VITALS — BP 145/89 | HR 62 | Temp 97.8°F | Resp 16 | Wt 257.0 lb

## 2022-11-09 DIAGNOSIS — B20 Human immunodeficiency virus [HIV] disease: Secondary | ICD-10-CM

## 2022-11-09 NOTE — Progress Notes (Signed)
[place] [facility]     HPI: Mark [name] is a 62 y.o. male male Dx HIV on 12/23.  Hx He was hospitalized in December for HF exacerbation found to e HIV+ 12/26 cd4 574, VL 15.1K. Pt reports he was sexually active about four years ago. With ex wife. Reports that wife was tested and states it was negative.  Today 11/09/22: No new complaints. Reports 100% adherence to Biktarvy.  Date of diagnosis 12/23 ART exposure none Past OIs Risk factors: heterosexual Social: Occupation: Art gallery manager Housing: apt by himself Support: has family, none know about HIV diagnosis Understanding of HIV: poor Etoh/drug/tobacco use: no/no/no Past Medical History:  Diagnosis Date   Abnormal lung function test    a. Reported to possibly be COPD, but per pulm note: "Possible COPD - PFT was more suggestive of restrictive defect and diffusion defect likely from obesity and CHF"   Chronic diastolic heart failure ([place]) 05/07/2018   CKD (chronic kidney disease), stage III (HCC)    Diabetes mellitus type 2 in obese [facility])    Elevated troponin 05/07/2018   Hearing impaired    Hyperlipidemia    Hypertension    LBBB (left bundle branch block)    LVH (left ventricular hypertrophy)    Mild aortic stenosis    Mild pulmonary hypertension ([place])    Morbid obesity ([place])    Sleep apnea     Past Surgical History:  Procedure Laterality Date   BACK SURGERY     RIGHT HEART CATH N/A 11/06/2022   Procedure: RIGHT HEART CATH;  Surgeon: Hebert Soho, DO;  Location: [facility];  Service: Cardiovascular;  Laterality: N/A;   TRACHEOSTOMY      Family History  Problem Relation Age of Onset   Hypertension Mother    Hypertension Father    Current Outpatient Medications on File Prior to Visit  Medication Sig Dispense Refill   atorvastatin (LIPITOR) 20 MG tablet Take 1 tablet (20 mg total) by mouth daily. 90 tablet 1   bictegravir-emtricitabine-tenofovir AF (BIKTARVY) 50-200-25 MG TABS tablet  Take 1 tablet by mouth daily. Try to take at the same time each day with or without food. 30 tablet 2   carvedilol (COREG) 12.5 MG tablet Take 1 tablet (12.5 mg total) by mouth 2 (two) times daily. 180 tablet 0   empagliflozin (JARDIANCE) 10 MG TABS tablet Take 1 tablet (10 mg total) by mouth daily. 30 tablet 3   levothyroxine (SYNTHROID) 25 MCG tablet Take 25 mcg by mouth daily before breakfast.     losartan (COZAAR) 100 MG tablet Take 1 tablet (100 mg total) by mouth daily. 90 tablet 1   metFORMIN (GLUCOPHAGE) 1000 MG tablet TAKE 1 TABLET (1,000 MG TOTAL) BY MOUTH TWICE A DAY WITH FOOD 180 tablet 1   torsemide (DEMADEX) 20 MG tablet Take 2 tablets (40 mg total) by mouth daily. 180 tablet 1   No current facility-administered medications on file prior to visit.    No Known Allergies    Lab Results HIV 1 RNA Quant  Date Value  09/28/2022 Not Detected Copies/mL  09/09/2022 5,490 copies/mL (H)  08/25/2022 15,100 copies/mL   CD4 T Cell Abs (/uL)  Date Value  09/28/2022 552  09/09/2022 334 (L)  08/25/2022 574   No results found for: "HIV1GENOSEQ" Lab Results  Component Value Date   WBC 5.3 11/06/2022   HGB 12.6 (L) 11/06/2022   HGB 12.2 (L) 11/06/2022   HCT 37.0 (L)  11/06/2022   HCT 36.0 (L) 11/06/2022   MCV 94.1 11/06/2022   PLT 161 11/06/2022    Lab Results  Component Value Date   CREATININE 1.90 (H) 10/19/2022   BUN 33 (H) 10/19/2022   NA 142 11/06/2022   NA 142 11/06/2022   K 4.5 11/06/2022   K 4.5 11/06/2022   CL 102 10/19/2022   CO2 30 10/19/2022   Lab Results  Component Value Date   ALT 14 10/19/2022   AST 15 10/19/2022   ALKPHOS 91 10/19/2022   BILITOT 0.6 10/19/2022    Lab Results  Component Value Date   CHOL 184 09/09/2022   TRIG 162 (H) 09/09/2022   HDL 46 09/09/2022   LDLCALC 110 (H) 09/09/2022   Lab Results  Component Value Date   HAV Reactive (A) 08/25/2022   Lab Results  Component Value Date   HEPBSAG NON REACTIVE 08/25/2022   No  results found for: "HCVAB" Lab Results  Component Value Date   CHLAMYDIAWP Negative 09/09/2022   N Negative 09/09/2022   No results found for: "GCPROBEAPT" No results found for: "QUANTGOLD"  Assessment/Plan #HIV -12/24 HIV+ 15.1 K VL, cd4 574. 1/9 labs VL 5490, cd4 334. Seen in clinic on 1/10 and started on biktarvy. CD4 552, VLND, on 09/28/22 Plan: -Continue biktarvy, pt reortss 100% adherence -HIV labs,  uirne gc and UA, F/U in 3 month    #HFpEF -[place]ht today, 253. Did n't take torsemide bc he came to appt , didn't want to have to go to appt.  -followed by Cards -[facility]   #Vaccination COVID bososter 04/13/22 Flu 04/13/22 Monkeypox PCV 20 04/13/22 Meningitis-needs HepA-immune  HEpB(sab nr, sag nr, ab Nr), needs hbv vaccine-1st dose today 09/28/22 Tdap unk Shingles needs   #Health maintenance -Quantiferon order today 09/09/21 -RPR NR 08/26/22 -HCV NR 08/23/22 -Oral  GC NR on 1/10,  -Colonoscopy-refer to GI for colnoscopy(referred in the past)   Laurice Record, MD [place] [facility] [place]   I have personally spent 32 minutes involved in face-to-face and non-face-to-face activities for this patient on the day of the visit. Professional time spent includes the following activities: Preparing to see the patient (review of tests), Obtaining and/or reviewing separately obtained history (admission/discharge record), Performing a medically appropriate examination and/or evaluation , Ordering medications/tests/procedures, referring and communicating with other health care professionals, Documenting clinical information in the EMR, Independently interpreting results (not separately reported), Communicating results to the patient/family/caregiver, Counseling and educating the patient/family/caregiver and Care coordination (not separately reported).

## 2022-11-10 LAB — T-HELPER CELLS (CD4) COUNT (NOT AT ARMC)
CD4 % Helper T Cell: 64 % (ref 33–65)
CD4 T Cell Abs: 807 /uL (ref 400–1790)

## 2022-11-12 ENCOUNTER — Ambulatory Visit: Payer: Medicare HMO | Admitting: Pulmonary Disease

## 2022-11-12 ENCOUNTER — Encounter: Payer: Self-pay | Admitting: Pulmonary Disease

## 2022-11-12 VITALS — BP 122/80 | HR 63 | Ht 69.0 in | Wt 255.2 lb

## 2022-11-12 DIAGNOSIS — G4733 Obstructive sleep apnea (adult) (pediatric): Secondary | ICD-10-CM

## 2022-11-12 LAB — COMPLETE METABOLIC PANEL WITH GFR
AG Ratio: 1.5 (calc) (ref 1.0–2.5)
ALT: 11 U/L (ref 9–46)
AST: 12 U/L (ref 10–35)
Albumin: 4.2 g/dL (ref 3.6–5.1)
Alkaline phosphatase (APISO): 86 U/L (ref 35–144)
BUN/Creatinine Ratio: 17 (calc) (ref 6–22)
BUN: 34 mg/dL — ABNORMAL HIGH (ref 7–25)
CO2: 31 mmol/L (ref 20–32)
Calcium: 9.6 mg/dL (ref 8.6–10.3)
Chloride: 104 mmol/L (ref 98–110)
Creat: 2 mg/dL — ABNORMAL HIGH (ref 0.70–1.35)
Globulin: 2.8 g/dL (calc) (ref 1.9–3.7)
Glucose, Bld: 99 mg/dL (ref 65–99)
Potassium: 4.3 mmol/L (ref 3.5–5.3)
Sodium: 143 mmol/L (ref 135–146)
Total Bilirubin: 0.6 mg/dL (ref 0.2–1.2)
Total Protein: 7 g/dL (ref 6.1–8.1)
eGFR: 37 mL/min/{1.73_m2} — ABNORMAL LOW (ref >=60)

## 2022-11-12 LAB — CBC WITH DIFFERENTIAL/PLATELET
Absolute Monocytes: 462 cells/uL (ref 200–950)
Basophils Absolute: 50 cells/uL (ref 0–200)
Basophils Relative: 0.9 %
Eosinophils Absolute: 143 cells/uL (ref 15–500)
Eosinophils Relative: 2.6 %
HCT: 38.6 % (ref 38.5–50.0)
Hemoglobin: 12.6 g/dL — ABNORMAL LOW (ref 13.2–17.1)
Lymphs Abs: 1375 cells/uL (ref 850–3900)
MCH: 29.5 pg (ref 27.0–33.0)
MCHC: 32.6 g/dL (ref 32.0–36.0)
MCV: 90.4 fL (ref 80.0–100.0)
MPV: 11.3 fL (ref 7.5–12.5)
Monocytes Relative: 8.4 %
Neutro Abs: 3471 cells/uL (ref 1500–7800)
Neutrophils Relative %: 63.1 %
Platelets: 183 10*3/uL (ref 140–400)
RBC: 4.27 10*6/uL (ref 4.20–5.80)
RDW: 16 % — ABNORMAL HIGH (ref 11.0–15.0)
Total Lymphocyte: 25 %
WBC: 5.5 10*3/uL (ref 3.8–10.8)

## 2022-11-12 LAB — HIV-1 RNA QUANT-NO REFLEX-BLD
HIV 1 RNA Quant: NOT DETECTED Copies/mL
HIV-1 RNA Quant, Log: NOT DETECTED Log cps/mL

## 2022-11-12 NOTE — Patient Instructions (Signed)
Schedule for split-night study  Follow-up after sleep study  Continue using oxygen around-the-clock  Continue weight loss efforts  Continue current medications  Call us with significant concerns  Living With Sleep Apnea Sleep apnea is a condition in which breathing pauses or becomes shallow during sleep. Sleep apnea is most commonly caused by a collapsed or blocked airway. People with sleep apnea usually snore loudly. They may have times when they gasp and stop breathing for 10 seconds or more during sleep. This may happen many times during the night. The breaks in breathing also interrupt the deep sleep that you need to feel rested. Even if you do not completely wake up from the gaps in breathing, your sleep may not be restful and you feel tired during the day. You may also have a headache in the morning and low energy during the day, and you may feel anxious or depressed. How can sleep apnea affect me? Sleep apnea increases your chances of extreme tiredness during the day (daytime fatigue). It can also increase your risk for health conditions, such as: Heart attack. Stroke. Obesity. Type 2 diabetes. Heart failure. Irregular heartbeat. High blood pressure. If you have daytime fatigue as a result of sleep apnea, you may be more likely to: Perform poorly at school or work. Fall asleep while driving. Have difficulty with attention. Develop depression or anxiety. Have sexual dysfunction. What actions can I take to manage sleep apnea? Sleep apnea treatment  If you were given a device to open your airway while you sleep, use it only as told by your health care provider. You may be given: An oral appliance. This is a custom-made mouthpiece that shifts your lower jaw forward. A continuous positive airway pressure (CPAP) device. This device blows air through a mask when you breathe out (exhale). A nasal expiratory positive airway pressure (EPAP) device. This device has valves that you put  into each nostril. A bi-level positive airway pressure (BIPAP) device. This device blows air through a mask when you breathe in (inhale) and breathe out (exhale). You may need surgery if other treatments do not work for you. Sleep habits Go to sleep and wake up at the same time every day. This helps set your internal clock (circadian rhythm) for sleeping. If you stay up later than usual, such as on weekends, try to get up in the morning within 2 hours of your normal wake time. Try to get at least 7-9 hours of sleep each night. Stop using a computer, tablet, and mobile phone a few hours before bedtime. Do not take long naps during the day. If you nap, limit it to 30 minutes. Have a relaxing bedtime routine. Reading or listening to music may relax you and help you sleep. Use your bedroom only for sleep. Keep your television and computer out of your bedroom. Keep your bedroom cool, dark, and quiet. Use a supportive mattress and pillows. Follow your health care provider's instructions for other changes to sleep habits. Nutrition Do not eat heavy meals in the evening. Do not have caffeine in the later part of the day. The effects of caffeine can last for more than 5 hours. Follow your health care provider's or dietitian's instructions for any diet changes. Lifestyle     Do not drink alcohol before bedtime. Alcohol can cause you to fall asleep at first, but then it can cause you to wake up in the middle of the night and have trouble getting back to sleep. Do not use any  products that contain nicotine or tobacco. These products include cigarettes, chewing tobacco, and vaping devices, such as e-cigarettes. If you need help quitting, ask your health care provider. Medicines Take over-the-counter and prescription medicines only as told by your health care provider. Do not use over-the-counter sleep medicine. You can become dependent on this medicine, and it can make sleep apnea worse. Do not use  medicines, such as sedatives and narcotics, unless told by your health care provider. Activity Exercise on most days, but avoid exercising in the evening. Exercising near bedtime can interfere with sleeping. If possible, spend time outside every day. Natural light helps regulate your circadian rhythm. General information Lose weight if you need to, and maintain a healthy weight. Keep all follow-up visits. This is important. If you are having surgery, make sure to tell your health care provider that you have sleep apnea. You may need to bring your device with you. Where to find more information Learn more about sleep apnea and daytime fatigue from: American Sleep Association: sleepassociation.[place]: sleepfoundation.org National Heart, Lung, and Blood Institute: https://www.hartman-hill.biz/ Summary Sleep apnea is a condition in which breathing pauses or becomes shallow during sleep. Sleep apnea can cause daytime fatigue and other serious health conditions. You may need to wear a device while sleeping to help keep your airway open. If you are having surgery, make sure to tell your health care provider that you have sleep apnea. You may need to bring your device with you. Making changes to sleep habits, diet, lifestyle, and activity can help you manage sleep apnea. This information is not intended to replace advice given to you by your health care provider. Make sure you discuss any questions you have with your health care provider. Document Revised: 03/26/2021 Document Reviewed: 07/26/2020 Elsevier Patient Education  [place].

## 2022-11-12 NOTE — Progress Notes (Signed)
Mark [name]    [id number]    1961-04-24  Primary Care Physician:Moore, Doreene Burke, [place]  Referring Physician: [name], [place],  [place] [address]  Chief complaint:   Patient with a history of severe obstructive sleep apnea, history of heart failure, chronic hypoxemic respiratory failure in for evaluation  HPI:  Was diagnosed with severe obstructive sleep apnea in 2015 Has not been on any therapy  History of heart failure Found to have chronic hypoxemic respiratory failure for which he is on oxygen supplementation Has diabetes, chronic kidney disease, hyperlipidemia, HIV since 2019 with good compliance and follow-up  Was recently hospitalized in December 2023 for hypoxemic respiratory failure, pulmonary hypertension He was discharged on oxygen at 2 L, uses a oxygen concentrator Has been trying to stay active  Reformed smoker  Wakes up tired in the morning but does not feel too sleepy during the day Denies any significant dryness of his mouth in the mornings, denies any headaches  Usually goes to bed between 930 and 10 PM Falls asleep quickly About 3 awakenings He is a side sleeper   Outpatient Encounter Medications as of 11/12/2022  Medication Sig   atorvastatin (LIPITOR) 20 MG tablet Take 1 tablet (20 mg total) by mouth daily.   bictegravir-emtricitabine-tenofovir AF (BIKTARVY) 50-200-25 MG TABS tablet Take 1 tablet by mouth daily. Try to take at the same time each day with or without food.   carvedilol (COREG) 12.5 MG tablet Take 1 tablet (12.5 mg total) by mouth 2 (two) times daily.   empagliflozin (JARDIANCE) 10 MG TABS tablet Take 1 tablet (10 mg total) by mouth daily.   levothyroxine (SYNTHROID) 25 MCG tablet Take 25 mcg by mouth daily before breakfast.   losartan (COZAAR) 100 MG tablet Take 1 tablet (100 mg total) by mouth daily.   metFORMIN (GLUCOPHAGE) 1000 MG tablet TAKE 1 TABLET (1,000 MG TOTAL) BY MOUTH TWICE A DAY  WITH FOOD   torsemide (DEMADEX) 20 MG tablet Take 2 tablets (40 mg total) by mouth daily.   No facility-administered encounter medications on file as of 11/12/2022.    Allergies as of 11/12/2022   (No Known Allergies)    Past Medical History:  Diagnosis Date   Abnormal lung function test    a. Reported to possibly be COPD, but per pulm note: "Possible COPD - PFT was more suggestive of restrictive defect and diffusion defect likely from obesity and CHF"   Chronic diastolic heart failure ([place]) 05/07/2018   CKD (chronic kidney disease), stage III (HCC)    Diabetes mellitus type 2 in obese [facility])    Elevated troponin 05/07/2018   Hearing impaired    Hyperlipidemia    Hypertension    LBBB (left bundle branch block)    LVH (left ventricular hypertrophy)    Mild aortic stenosis    Mild pulmonary hypertension ([place])    Morbid obesity ([place])    Sleep apnea     Past Surgical History:  Procedure Laterality Date   BACK SURGERY     RIGHT HEART CATH N/A 11/06/2022   Procedure: RIGHT HEART CATH;  Surgeon: Hebert Soho, DO;  Location: [facility];  Service: Cardiovascular;  Laterality: N/A;   TRACHEOSTOMY      Family History  Problem Relation Age of Onset   Hypertension Mother    Hypertension Father     Social History   Socioeconomic History   Marital status: Divorced  Spouse name: Not on file   Number of children: 2   Years of education: Not on file   Highest education level: Not on file  Occupational History   Occupation: self-employed    Comment: recieves disability  Tobacco Use   Smoking status: Former    Years: 1    Types: Cigarettes    Quit date: 1999    Years since quitting: 25.2   Smokeless tobacco: Never   Tobacco comments:    1 pack would last one month--08/01/18  Vaping Use   Vaping Use: Never used  Substance and Sexual Activity   Alcohol use: No   Drug use: No   Sexual activity: Not Currently    Partners: Female    Comment: declied condom  Other  Topics Concern   Not on file  Social History Narrative   Not on file   Social Determinants of Health   Financial Resource Strain: Medium Risk (09/21/2022)   Overall Financial Resource Strain (CARDIA)    Difficulty of Paying Living Expenses: Somewhat hard  Food Insecurity: No Food Insecurity (09/21/2022)   Hunger Vital Sign    Worried About Running Out of Food in the Last Year: Never true    Ran Out of Food in the Last Year: Never true  Transportation Needs: No Transportation Needs (09/21/2022)   PRAPARE - Hydrologist (Medical): No    Lack of Transportation (Non-Medical): No  Physical Activity: Inactive (04/08/2022)   Exercise Vital Sign    Days of Exercise per Week: 0 days    Minutes of Exercise per Session: 0 min  Stress: No Stress Concern Present (04/08/2022)   Experiment    Feeling of Stress : Not at all  Social Connections: Not on file  Intimate Partner Violence: Not At Risk (08/21/2022)   Humiliation, Afraid, Rape, and Kick questionnaire    Fear of Current or Ex-Partner: No    Emotionally Abused: No    Physically Abused: No    Sexually Abused: No    Review of Systems  Constitutional:  Negative for fatigue.  Respiratory:  Positive for apnea. Negative for shortness of breath.   Psychiatric/Behavioral:  Positive for sleep disturbance.     Vitals:   11/12/22 1000  BP: 122/80  Pulse: 63  SpO2: 99%     Physical Exam Constitutional:      Appearance: He is obese.  HENT:     Head: Normocephalic.     Mouth/Throat:     Mouth: Mucous membranes are moist.     Comments: Crowded oropharynx, Mallampati 3 Eyes:     Pupils: Pupils are equal, round, and reactive to light.  Cardiovascular:     Rate and Rhythm: Normal rate and regular rhythm.     Heart sounds: No murmur heard.    No friction rub.  Pulmonary:     Effort: No respiratory distress.     Breath sounds: No stridor. No  wheezing or rhonchi.  Musculoskeletal:     Cervical back: Normal range of motion. No rigidity or tenderness.  Neurological:     Mental Status: He is alert.  Psychiatric:        Mood and Affect: Mood normal.      10/15/2022    9:00 AM  Results of the Epworth flowsheet  Sitting and reading 0  Watching TV 1  Sitting, inactive in a public place (e.g. a theatre or a meeting) 0  As  a passenger in a car for an hour without a break 1  Lying down to rest in the afternoon when circumstances permit 1  Sitting and talking to someone 0  Sitting quietly after a lunch without alcohol 0  In a car, while stopped for a few minutes in traffic 0  Total score 3    Data Reviewed: Sleep study from 2015 does reveal severe obstructive sleep apnea Recent echocardiogram with borderline low ejection fraction, grade 1 diastolic dysfunction  Assessment:  History of obstructive sleep apnea Has not been on treatment  History of heart failure with reduced ejection fraction  Pulmonary hypertension  Chronic hypoxemic respiratory failure on oxygen supplementation    Plan/Recommendations: Continue oxygen supplementation  Schedule patient for split-night study  Graded exercise as tolerated to encourage weight loss efforts  Encouraged to give [place] a call with any significant concerns  Will see him back in about 3 months   Sherrilyn Rist MD [facility] 11/12/2022, 11:40 AM  CC: [name], FNP

## 2022-11-13 ENCOUNTER — Ambulatory Visit: Payer: Self-pay

## 2022-11-13 NOTE — Patient Instructions (Signed)
Visit Information  Thank you for taking time to visit with me today. Please don't hesitate to contact me if I can be of assistance to you.   Following are the goals we discussed today:   Goals Addressed             This Visit's Progress    To complete a sleep study to diagnose obstructive sleep apnea       Care Coordination Interventions: Evaluation of current treatment plan related to OSA and patient's adherence to plan as established by provider Determined patient completed a follow up with Dr. Ander Slade for evaluation of OSA Review of patient status, including review of consultant's reports, relevant laboratory and other test results Educated patient on the referral process and discussed next steps, patient verbalizes understanding            Our next appointment is by telephone on 11/27/22 at 1:30 PM  Please call the care guide team at [phone] if you need to cancel or reschedule your appointment.   If you are experiencing a Mental Health or [place] or need someone to talk to, please call 1-800-273-TALK (toll free, 24 hour hotline) go to [facility] [phone])  The patient verbalized understanding of instructions, educational materials, and care plan provided today and DECLINED offer to receive copy of patient instructions, educational materials, and care plan.   Barb Merino, RN, BSN, CCM Care Management Coordinator [facility] Care Management  Direct Phone: [phone]

## 2022-11-13 NOTE — Patient Outreach (Signed)
  Care Coordination   Follow Up Visit Note   11/13/2022 Name: NAOKI HUCKEBA MRN: [id number] DOB: [birth year]  [name] is a 62 y.o. year old male who sees [name], [place] for primary care. I spoke with  [name] by phone today.  What matters to the patients health and wellness today?  Patient will complete a sleep study as directed for evaluation of OSA.     Goals Addressed             This Visit's Progress    To complete a sleep study to diagnose obstructive sleep apnea       Care Coordination Interventions: Evaluation of current treatment plan related to OSA and patient's adherence to plan as established by provider Determined patient completed a follow up with Dr. Ander Slade for evaluation of OSA Review of patient status, including review of consultant's reports, relevant laboratory and other test results Educated patient on the referral process and discussed next steps, patient verbalizes understanding     Interventions Today    Flowsheet Row Most Recent Value  Chronic Disease   Chronic disease during today's visit Other  [OSA]  General Interventions   General Interventions Discussed/Reviewed General Interventions Discussed, General Interventions Reviewed, Doctor Visits  Doctor Visits Discussed/Reviewed Specialist, Doctor Visits Discussed, Doctor Visits Reviewed          SDOH assessments and interventions completed:  No     Care Coordination Interventions:  Yes, provided   Follow up plan: Follow up call scheduled for 11/27/22 @1 :30 PM    Encounter Outcome:  Pt. Visit Completed

## 2022-11-18 ENCOUNTER — Other Ambulatory Visit: Payer: Self-pay

## 2022-11-18 MED ORDER — CHLORHEXIDINE GLUCONATE 0.12 % MT SOLN
OROMUCOSAL | 6 refills | Status: DC
  Filled 2022-11-18: qty 473, 30d supply, fill #0

## 2022-11-18 MED ORDER — AMOXICILLIN 500 MG PO CAPS
500.0000 mg | ORAL_CAPSULE | Freq: Three times a day (TID) | ORAL | 0 refills | Status: AC
  Filled 2022-11-18: qty 21, 7d supply, fill #0

## 2022-12-04 ENCOUNTER — Other Ambulatory Visit: Payer: Self-pay | Admitting: Internal Medicine

## 2022-12-04 DIAGNOSIS — B20 Human immunodeficiency virus [HIV] disease: Secondary | ICD-10-CM

## 2022-12-09 ENCOUNTER — Ambulatory Visit (HOSPITAL_COMMUNITY)
Admission: RE | Admit: 2022-12-09 | Discharge: 2022-12-09 | Disposition: A | Discharge status: Home / Self Care | Payer: Medicare HMO | Source: Ambulatory Visit | Attending: Cardiology | Admitting: Cardiology

## 2022-12-09 DIAGNOSIS — I5032 Chronic diastolic (congestive) heart failure: Secondary | ICD-10-CM | POA: Diagnosis not present

## 2022-12-09 LAB — PULMONARY FUNCTION TEST
DL/VA % pred: 78 %
DL/VA: 3.27 ml/min/mmHg/L
DLCO unc % pred: 52 %
DLCO unc: 14.51 ml/min/mmHg
FEF 25-75 Post: 1.92 L/sec
FEF 25-75 Pre: 1.98 L/sec
FEF2575-%Change-Post: -3 %
FEF2575-%Pred-Post: 67 %
FEF2575-%Pred-Pre: 69 %
FEV1-%Change-Post: 0 %
FEV1-%Pred-Post: 60 %
FEV1-%Pred-Pre: 61 %
FEV1-Post: 2.19 L
FEV1-Pre: 2.2 L
FEV1FVC-%Change-Post: 3 %
FEV1FVC-%Pred-Pre: 103 %
FEV6-%Change-Post: -3 %
FEV6-%Pred-Post: 59 %
FEV6-%Pred-Pre: 61 %
FEV6-Post: 2.72 L
FEV6-Pre: 2.83 L
FEV6FVC-%Pred-Post: 105 %
FEV6FVC-%Pred-Pre: 105 %
FVC-%Change-Post: -3 %
FVC-%Pred-Post: 56 %
FVC-%Pred-Pre: 58 %
FVC-Post: 2.72 L
FVC-Pre: 2.83 L
Post FEV1/FVC ratio: 80 %
Post FEV6/FVC ratio: 100 %
Pre FEV1/FVC ratio: 78 %
Pre FEV6/FVC Ratio: 100 %
RV % pred: 107 %
RV: 2.55 L
TLC % pred: 76 %
TLC: 5.51 L

## 2022-12-09 MED ORDER — ALBUTEROL SULFATE (2.5 MG/3ML) 0.083% IN NEBU
2.5000 mg | INHALATION_SOLUTION | Freq: Once | RESPIRATORY_TRACT | Status: AC
  Administered 2022-12-09: 2.5 mg via RESPIRATORY_TRACT

## 2022-12-22 ENCOUNTER — Ambulatory Visit: Payer: Self-pay

## 2022-12-22 NOTE — Patient Outreach (Signed)
  Care Coordination   Follow Up Visit Note   12/22/2022 Name: Mark [name] MRN: [id number] DOB: Apr 12, 1961  Mark [name] is a 62 y.o. year old male who sees [name], FNP for primary care. I spoke with  Mark [name] by phone today.  What matters to the patients health and wellness today?  Patient would like to lose weight and establish a routine exercise regimen.     Goals Addressed               This Visit's Progress     Patient Stated     COMPLETED: To be less anxious about my health (pt-stated)        Care Coordination Interventions: Evaluation of current treatment plan related to anxiety and patient's adherence to plan as established by provider Determined patient feels his health has improved and he feels good about how things are going for him Discussed patient would like to work on losing weight and become more physically active (see new goal)      Other     I would like to lose weight and be more physically active        Care Coordination Interventions: Advised patient to discuss with primary care provider options regarding weight management Provided verbal and/or written education to patient re: provider recommended life style modifications  Educated patient about the PREP program, determined patient would like to proceed  Sent in basket message to PCP [name] FNP requesting PREP referral, mailed PREP brochure Positive reinforcement given to patient for making positive lifestyle changes to improve his overall health          To complete a sleep study to diagnose obstructive sleep apnea        Care Coordination Interventions: Evaluation of current treatment plan related to OSA and patient's adherence to plan as established by provider Reviewed and discussed with patient, his upcoming provider appointment for a split sleep study scheduled for 12/31/22 :00 PM Confirmed patient confirmed having transportation, he will drive himself, he denies  having questions at this time      Interventions Today    Flowsheet Row Most Recent Value  Chronic Disease   Chronic disease during today's visit Other  [OSA]  General Interventions   General Interventions Discussed/Reviewed General Interventions Discussed, General Interventions Reviewed, Doctor Visits, Durable Medical Equipment (DME)  Doctor Visits Discussed/Reviewed Doctor Visits Discussed, Doctor Visits Reviewed, PCP, Specialist  Durable Medical Equipment (DME) Oxygen  Exercise Interventions   Exercise Discussed/Reviewed Exercise Discussed, Exercise Reviewed, Physical Activity  Physical Activity Discussed/Reviewed Physical Activity Reviewed, Physical Activity Discussed, PREP, Types of exercise  Education Interventions   Education Provided Provided Education, Provided Printed Education         SDOH assessments and interventions completed:  No     Care Coordination Interventions:  Yes, provided   Follow up plan: Follow up call scheduled for 01/13/23 :30 PM    Encounter Outcome:  Pt. Visit Completed

## 2022-12-22 NOTE — Patient Instructions (Signed)
Visit Information  Thank you for taking time to visit with me today. Please don't hesitate to contact me if I can be of assistance to you.   Following are the goals we discussed today:   Goals Addressed               This Visit's Progress     Patient Stated     COMPLETED: To be less anxious about my health (pt-stated)        Care Coordination Interventions: Evaluation of current treatment plan related to anxiety and patient's adherence to plan as established by provider Determined patient feels his health has improved and he feels good about how things are going for him Discussed patient would like to work on losing weight and become more physically active (see new goal)      Other     I would like to lose weight and be more physically active        Care Coordination Interventions: Advised patient to discuss with primary care provider options regarding weight management Provided verbal and/or written education to patient re: provider recommended life style modifications  Educated patient about the PREP program, determined patient would like to proceed  Sent in basket message to PCP [name] FNP requesting PREP referral, mailed PREP brochure Positive reinforcement given to patient for making positive lifestyle changes to improve his overall health          To complete a sleep study to diagnose obstructive sleep apnea        Care Coordination Interventions: Evaluation of current treatment plan related to OSA and patient's adherence to plan as established by provider Reviewed and discussed with patient, his upcoming provider appointment for a split sleep study scheduled for 12/31/22 :00 PM Confirmed patient confirmed having transportation, he will drive himself, he denies having questions at this time         Our next appointment is by telephone on 01/13/23 at 1:30 PM  Please call the care guide team at [phone] if you need to cancel or reschedule your appointment.    If you are experiencing a Mental Health or Behavioral Health Crisis or need someone to talk to, please call 1-800-273-TALK (toll free, 24 hour hotline) go to [facility] [address] [place] [phone])  The patient verbalized understanding of instructions, educational materials, and care plan provided today and DECLINED offer to receive copy of patient instructions, educational materials, and care plan.   Delsa Sale, RN, BSN, CCM Care Management Coordinator [facility] Care Management  Direct Phone: [phone]

## 2022-12-24 ENCOUNTER — Other Ambulatory Visit: Payer: Self-pay | Admitting: Nurse Practitioner

## 2022-12-24 DIAGNOSIS — I5032 Chronic diastolic (congestive) heart failure: Secondary | ICD-10-CM

## 2022-12-28 ENCOUNTER — Encounter (HOSPITAL_COMMUNITY)
Admission: RE | Admit: 2022-12-28 | Discharge: 2022-12-28 | Disposition: A | Discharge status: Home / Self Care | Payer: Medicare HMO | Source: Ambulatory Visit | Attending: Cardiology | Admitting: Cardiology

## 2022-12-28 DIAGNOSIS — I5032 Chronic diastolic (congestive) heart failure: Secondary | ICD-10-CM

## 2022-12-28 MED ORDER — TECHNETIUM TC 99M PYROPHOSPHATE
22.3000 | Freq: Once | INTRAVENOUS | Status: AC | PRN
  Administered 2022-12-28: 22.3 via INTRAVENOUS
  Filled 2022-12-28: qty 23

## 2022-12-30 ENCOUNTER — Telehealth: Payer: Self-pay | Admitting: Cardiovascular Disease

## 2022-12-30 NOTE — Telephone Encounter (Signed)
Patient called to follow-up on test results. 

## 2022-12-30 NOTE — Telephone Encounter (Signed)
Called patient, advised that I did not see any results yet from the testing on 04/29- patient is concerned and wants someone to explain the results. Will route to office that ordered the testing.  Patient verbalized understanding.

## 2022-12-30 NOTE — Telephone Encounter (Signed)
I spoke to patient and informed him that we would call him when the results were in and the doctor reads them.

## 2022-12-31 ENCOUNTER — Ambulatory Visit (HOSPITAL_BASED_OUTPATIENT_CLINIC_OR_DEPARTMENT_OTHER): Payer: Medicare HMO | Attending: Pulmonary Disease | Admitting: Pulmonary Disease

## 2022-12-31 DIAGNOSIS — G4733 Obstructive sleep apnea (adult) (pediatric): Secondary | ICD-10-CM | POA: Diagnosis not present

## 2022-12-31 DIAGNOSIS — R0683 Snoring: Secondary | ICD-10-CM | POA: Insufficient documentation

## 2022-12-31 DIAGNOSIS — E669 Obesity, unspecified: Secondary | ICD-10-CM | POA: Insufficient documentation

## 2023-01-01 ENCOUNTER — Telehealth: Payer: Self-pay

## 2023-01-01 NOTE — Telephone Encounter (Signed)
Patient called office today requesting refill on Biktarvy. States that he was told by provider to call office when he is running low. Has not called pharmacy to see if refills were on file.  Called [facility] who will refill prescription today. Does not have copay. Relayed this to patient. Advised for future refills he will need to call pharmacy himself. Verbalized understanding. Juanita Laster, RMA

## 2023-01-04 ENCOUNTER — Ambulatory Visit: Payer: Medicare HMO | Admitting: Nurse Practitioner

## 2023-01-04 DIAGNOSIS — I509 Heart failure, unspecified: Secondary | ICD-10-CM | POA: Diagnosis not present

## 2023-01-06 ENCOUNTER — Ambulatory Visit: Payer: Medicare HMO

## 2023-01-06 NOTE — Patient Outreach (Signed)
  Care Coordination   Follow Up Visit Note   01/06/2023 Name: Mark [name] MRN: [id number] DOB: 12/05/1960  Mark [name] is a 62 y.o. year old male who sees [name], FNP for primary care. I spoke with  Mark [name] by phone today.  What matters to the patients health and wellness today?  Patient would like to participate in the PREP program. He would like to focus on losing weight.     Goals Addressed             This Visit's Progress    I would like to lose weight and be more physically active       Care Coordination Interventions: Received inbound call from patient stating he would like to participate in the PREP program Educated patient about next steps, sent message to PCP provider [name] FNP requesting PREP referral  Positive reinforcement given to patient for making lifestyle changes to help improve his overall health  Reviewed with patient upcoming scheduled appointments with PCP, [name] FNP scheduled for 01/12/23 and Cardiologist, Dr. Flora Lipps scheduled for 01/15/23    Interventions Today    Flowsheet Row Most Recent Value  General Interventions   General Interventions Discussed/Reviewed General Interventions Discussed, General Interventions Reviewed, Doctor Visits, Communication with  Doctor Visits Discussed/Reviewed Doctor Visits Reviewed, PCP  Communication with PCP/Specialists  [name] FNP]  Exercise Interventions   Exercise Discussed/Reviewed Exercise Discussed, Exercise Reviewed, Physical Activity  Physical Activity Discussed/Reviewed PREP, Physical Activity Reviewed  Education Interventions   Education Provided Provided Education  Provided Verbal Education On Exercise          SDOH assessments and interventions completed:  No     Care Coordination Interventions:  Yes, provided   Follow up plan: Follow up call scheduled for 01/18/23 @09 :30 AM    Encounter Outcome:  Pt. Visit Completed

## 2023-01-06 NOTE — Patient Instructions (Signed)
Visit Information  Thank you for taking time to visit with me today. Please don't hesitate to contact me if I can be of assistance to you.   Following are the goals we discussed today:   Goals Addressed             This Visit's Progress    I would like to lose weight and be more physically active       Care Coordination Interventions: Received inbound call from patient stating he would like to participate in the PREP program Educated patient about next steps, sent message to PCP provider [name] FNP requesting PREP referral  Positive reinforcement given to patient for making lifestyle changes to help improve his overall health  Reviewed with patient upcoming scheduled appointments with PCP, [name] FNP scheduled for 01/12/23 and Cardiologist, Dr. Flora Lipps scheduled for 01/15/23        Our next appointment is by telephone on 01/18/23 at 09:30 AM  Please call the care guide team at [phone] if you need to cancel or reschedule your appointment.   If you are experiencing a Mental Health or Behavioral Health Crisis or need someone to talk to, please call 1-800-273-TALK (toll free, 24 hour hotline) go to [facility] [address] [place] [phone])  The patient verbalized understanding of instructions, educational materials, and care plan provided today and DECLINED offer to receive copy of patient instructions, educational materials, and care plan.   Delsa Sale, RN, BSN, CCM Care Management Coordinator [name] Care Management Direct Phone: [phone]

## 2023-01-08 ENCOUNTER — Other Ambulatory Visit: Payer: Self-pay | Admitting: Cardiology

## 2023-01-11 ENCOUNTER — Encounter (HOSPITAL_COMMUNITY): Payer: Self-pay | Admitting: Cardiology

## 2023-01-11 ENCOUNTER — Ambulatory Visit (HOSPITAL_COMMUNITY)
Admission: RE | Admit: 2023-01-11 | Discharge: 2023-01-11 | Disposition: A | Discharge status: Home / Self Care | Payer: Medicare HMO | Source: Ambulatory Visit | Attending: Cardiology | Admitting: Cardiology

## 2023-01-11 VITALS — BP 108/70 | HR 63 | Wt 257.8 lb

## 2023-01-11 DIAGNOSIS — G4733 Obstructive sleep apnea (adult) (pediatric): Secondary | ICD-10-CM

## 2023-01-11 DIAGNOSIS — N1832 Chronic kidney disease, stage 3b: Secondary | ICD-10-CM | POA: Diagnosis not present

## 2023-01-11 DIAGNOSIS — Z87891 Personal history of nicotine dependence: Secondary | ICD-10-CM | POA: Insufficient documentation

## 2023-01-11 DIAGNOSIS — Z6835 Body mass index (BMI) 35.0-35.9, adult: Secondary | ICD-10-CM | POA: Diagnosis not present

## 2023-01-11 DIAGNOSIS — B20 Human immunodeficiency virus [HIV] disease: Secondary | ICD-10-CM

## 2023-01-11 DIAGNOSIS — E669 Obesity, unspecified: Secondary | ICD-10-CM | POA: Diagnosis not present

## 2023-01-11 DIAGNOSIS — Z8249 Family history of ischemic heart disease and other diseases of the circulatory system: Secondary | ICD-10-CM | POA: Insufficient documentation

## 2023-01-11 DIAGNOSIS — I130 Hypertensive heart and chronic kidney disease with heart failure and stage 1 through stage 4 chronic kidney disease, or unspecified chronic kidney disease: Secondary | ICD-10-CM | POA: Diagnosis not present

## 2023-01-11 DIAGNOSIS — I5032 Chronic diastolic (congestive) heart failure: Secondary | ICD-10-CM

## 2023-01-11 DIAGNOSIS — Z5986 Financial insecurity: Secondary | ICD-10-CM | POA: Diagnosis not present

## 2023-01-11 DIAGNOSIS — E785 Hyperlipidemia, unspecified: Secondary | ICD-10-CM | POA: Diagnosis not present

## 2023-01-11 DIAGNOSIS — Z79899 Other long term (current) drug therapy: Secondary | ICD-10-CM | POA: Insufficient documentation

## 2023-01-11 DIAGNOSIS — Z21 Asymptomatic human immunodeficiency virus [HIV] infection status: Secondary | ICD-10-CM | POA: Diagnosis not present

## 2023-01-11 DIAGNOSIS — I10 Essential (primary) hypertension: Secondary | ICD-10-CM | POA: Diagnosis not present

## 2023-01-11 LAB — BASIC METABOLIC PANEL
Anion gap: 9 (ref 5–15)
BUN: 28 mg/dL — ABNORMAL HIGH (ref 8–23)
CO2: 29 mmol/L (ref 22–32)
Calcium: 9.2 mg/dL (ref 8.9–10.3)
Chloride: 103 mmol/L (ref 98–111)
Creatinine, Ser: 1.98 mg/dL — ABNORMAL HIGH (ref 0.61–1.24)
GFR, Estimated: 37 mL/min — ABNORMAL LOW (ref >60)
Glucose, Bld: 85 mg/dL (ref 70–99)
Potassium: 4.3 mmol/L (ref 3.5–5.1)
Sodium: 141 mmol/L (ref 135–145)

## 2023-01-11 LAB — BRAIN NATRIURETIC PEPTIDE: B Natriuretic Peptide: 86.8 pg/mL (ref 0.0–100.0)

## 2023-01-11 NOTE — Progress Notes (Signed)
Mark [name],acting as a Neurosurgeon for [name], FNP.,have documented all relevant documentation on the behalf of [name], FNP,as directed by  [name], FNP while in the presence of [name], FNP.    Subjective:     Patient ID: Mark [name] , male    DOB: May 08, 1961 , 62 y.o.   MRN: [id number]   Chief Complaint  Patient presents with   Diabetes    HPI  Patient presents today for a DM check, patient states compliance with medications and has no other concerns today. He is not working at this time due to wearing when he is out for appts, stores and wears his concentrator when at home at 2 l/m.   He continues to be followed by Infectious Disease.   BP Readings from Last 3 Encounters: 01/12/23 : 120/80 01/11/23 : 108/70 11/12/22 : 122/80    Diabetes He presents for his follow-up diabetic visit. He has type 2 diabetes mellitus. There are no hypoglycemic associated symptoms. Pertinent negatives for hypoglycemia include no dizziness or headaches. There are no diabetic associated symptoms. Pertinent negatives for diabetes include no chest pain, no fatigue, no polydipsia, no polyphagia and no polyuria. There are no hypoglycemic complications. Diabetic complications include heart disease. Risk factors for coronary artery disease include diabetes mellitus, hypertension, sedentary lifestyle, obesity and male sex. He is compliant with treatment most of the time. His weight is stable. He is following a diabetic diet. When asked about meal planning, he reported none. He has not had a previous visit with a dietitian. He rarely participates in exercise. There is no change in his home blood glucose trend. (Checking blood sugar averages 100. ) An ACE inhibitor/angiotensin II receptor blocker is being taken. He does not see a podiatrist.Eye exam is not current.  Hypertension This is a chronic problem. The current episode started more than 1 year ago. The problem is unchanged. The  problem is controlled. Pertinent negatives include no anxiety, chest pain, headaches or palpitations. Past treatments include angiotensin blockers and lifestyle changes. There are no compliance problems.  There is no history of angina. There is no history of chronic renal disease.     Past Medical History:  Diagnosis Date   Abnormal lung function test    a. Reported to possibly be COPD, but per pulm note: "Possible COPD - PFT was more suggestive of restrictive defect and diffusion defect likely from obesity and CHF"   CHF (congestive heart failure) (HCC)    Chronic diastolic heart failure (HCC) 05/07/2018   CKD (chronic kidney disease), stage III (HCC)    Diabetes mellitus type 2 in obese    Elevated troponin 05/07/2018   Hearing impaired    Hyperlipidemia    Hypertension    LBBB (left bundle branch block)    LVH (left ventricular hypertrophy)    Mild aortic stenosis    Mild pulmonary hypertension (HCC)    Morbid obesity (HCC)    Pulmonary hypertension (HCC)    Sleep apnea      Family History  Problem Relation Age of Onset   Hypertension Mother    Hypertension Father      Current Outpatient Medications:    BIKTARVY 50-200-25 MG TABS tablet, TAKE 1 TABLET BY MOUTH DAILY. TRY TO TAKE AT THE SAME TIME EACH DAY WITH OR WITHOUT FOOD., Disp: 30 tablet, Rfl: 2   carvedilol (COREG) 12.5 MG tablet, Take 1 tablet (12.5 mg total) by mouth 2 (two) times daily., Disp: 180 tablet, Rfl:  0   JARDIANCE 10 MG TABS tablet, TAKE 1 TABLET BY MOUTH EVERY DAY, Disp: 30 tablet, Rfl: 3   losartan (COZAAR) 100 MG tablet, Take 1 tablet (100 mg total) by mouth daily., Disp: 90 tablet, Rfl: 1   metFORMIN (GLUCOPHAGE) 1000 MG tablet, TAKE 1 TABLET (1,000 MG TOTAL) BY MOUTH TWICE A DAY WITH FOOD, Disp: 180 tablet, Rfl: 1   torsemide (DEMADEX) 20 MG tablet, Take 2 tablets (40 mg total) by mouth daily., Disp: 180 tablet, Rfl: 1   Blood Pressure Monitoring (BLOOD PRESSURE CUFF) MISC, Use to check blood pressure  x1 daily, Disp: 1 each, Rfl: 0   No Known Allergies   Review of Systems  Constitutional: Negative.  Negative for fatigue.  HENT:  Positive for hearing loss (Wears hearing aids).   Eyes: Negative.   Respiratory: Negative.    Cardiovascular: Negative.  Negative for chest pain and palpitations.  Gastrointestinal: Negative.   Endocrine: Negative.  Negative for polydipsia, polyphagia and polyuria.  Genitourinary: Negative.   Musculoskeletal: Negative.   Skin: Negative.   Allergic/Immunologic: Negative.   Neurological: Negative.  Negative for dizziness and headaches.  Hematological: Negative.   Psychiatric/Behavioral: Negative.       Today's Vitals   01/12/23 0931  BP: 120/80  Pulse: 63  Temp: 98.4 F (36.9 C)  TempSrc: Oral  Weight: 253 lb 6.4 oz (114.9 kg)  Height: 5\' 11"  (1.803 m)  PainSc: 0-No pain   Body mass index is 35.34 kg/m.  Wt Readings from Last 3 Encounters:  01/18/23 256 lb 9.6 oz (116.4 kg)  01/15/23 253 lb (114.8 kg)  01/12/23 253 lb 6.4 oz (114.9 kg)    The 10-year ASCVD risk score (Arnett DK, et al., 2019) is: 27.6%   Values used to calculate the score:     Age: 73 years     Sex: Male     Is Non-Hispanic African American: Yes     Diabetic: Yes     Tobacco smoker: No     Systolic Blood Pressure: 130 mmHg     Is BP treated: Yes     HDL Cholesterol: 45 mg/dL     Total Cholesterol: 166 mg/dL ++ Objective:  Physical Exam Vitals reviewed.  Constitutional:      General: He is not in acute distress.    Appearance: Normal appearance. He is obese.  Cardiovascular:     Rate and Rhythm: Normal rate and regular rhythm.     Pulses: Normal pulses.     Heart sounds: Normal heart sounds. No murmur heard. Pulmonary:     Effort: Pulmonary effort is normal. No respiratory distress.     Breath sounds: Normal breath sounds. No wheezing.  Skin:    General: Skin is warm.     Capillary Refill: Capillary refill takes less than 2 seconds.  Neurological:      General: No focal deficit present.     Mental Status: He is alert and oriented to person, place, and time.     Cranial Nerves: No cranial nerve deficit.     Motor: No weakness.  Psychiatric:        Mood and Affect: Mood normal.        Behavior: Behavior normal.        Thought Content: Thought content normal.        Judgment: Judgment normal.         Assessment And Plan:     1. Type 2 diabetes mellitus with other circulatory complication, without  long-term current use of insulin (HCC) Comments: Hemoglobin A1c has been stable we will recheck today - Hemoglobin A1c - Amb Referral To Provider Referral Exercise Program (P.R.E.P)  2. Hypertensive heart disease with chronic diastolic congestive heart failure (HCC) Comments: Blood pressure is controlled.  Continue follow-up with cardiology.  Continue current medications - Amb Referral To Provider Referral Exercise Program (P.R.E.P)  3. OSA (obstructive sleep apnea) Comments: Continue wearing CPAP, feels like it is providing improved quality of life.  4. Mixed hyperlipidemia Comments: Cholesterol levels are stable.  No current medications.  Will check lipid panel. - Lipid panel - Amb Referral To Provider Referral Exercise Program (P.R.E.P)  5. Herpes zoster vaccination declined Declines shingrix, educated on disease process and is aware if he changes his mind to notify office  6. Tetanus, diphtheria, and acellular pertussis (Tdap) vaccination declined  7. Encounter for screening colonoscopy - Ambulatory referral to Gastroenterology  8. Asymptomatic HIV infection, with no history of HIV-related illness (HCC) Comments: Continue follow-up with them for the disease and continue current medications.    Return for controlled DM check 4 months; schedule eye exam June. .  Patient was given opportunity to ask questions. Patient verbalized understanding of the plan and was able to repeat key elements of the plan. All questions were answered  to their satisfaction.   [name], FNP   I, [name], FNP, have reviewed all documentation for this visit. The documentation on 01/12/23 for the exam, diagnosis, procedures, and orders are all accurate and complete.   IF YOU HAVE BEEN REFERRED TO A SPECIALIST, IT MAY TAKE 1-2 WEEKS TO SCHEDULE/PROCESS THE REFERRAL. IF YOU HAVE NOT HEARD FROM US/SPECIALIST IN TWO WEEKS, PLEASE GIVE [place] A CALL AT [phone].   THE PATIENT IS ENCOURAGED TO PRACTICE SOCIAL DISTANCING DUE TO THE COVID-19 PANDEMIC.

## 2023-01-11 NOTE — Patient Instructions (Addendum)
No changes to your medications   Labs done today, your results will be available in MyChart, we will contact you for abnormal readings.  Your physician recommends that you schedule a follow-up appointment in: 4 months  If you have any questions or concerns before your next appointment please send [place] a message through [place] or call our office at [phone].    TO LEAVE A MESSAGE FOR THE NURSE SELECT OPTION 2, PLEASE LEAVE A MESSAGE INCLUDING: YOUR NAME DATE OF BIRTH CALL BACK NUMBER REASON FOR CALL**this is important as we prioritize the call backs  YOU WILL RECEIVE A CALL BACK THE SAME DAY AS LONG AS YOU CALL BEFORE 4:00 PM  At the [facility], you and your health needs are our priority. As part of our continuing mission to provide you with exceptional heart care, we have created designated Provider Care Teams. These Care Teams include your primary Cardiologist (physician) and Advanced Practice Providers (APPs- Physician Assistants and Nurse Practitioners) who all work together to provide you with the care you need, when you need it.   You may see any of the following providers on your designated Care Team at your next follow up: Dr Arvilla Meres Dr Marca Ancona Dr. Marcos Eke, NP Robbie Lis, [facility], [place] Brynda Peon, NP Karle Plumber, PharmD   Please be sure to bring in all your medications bottles to every appointment.    Thank you for choosing [facility]

## 2023-01-11 NOTE — Progress Notes (Signed)
ADVANCED HEART FAILURE CLINIC NOTE  Referring Physician: [name], FNP  Primary Care: [name], FNP Primary Cardiologist: Armanda Magic  HPI: Mark [name] is a 62 y.o. male w/ HFpEF, severe OSA, recently diagnosed HIV on Biktarvy (followed by Dr. Thedore Mins) admitted most recently in December 2023 for acute hypoxic respiratory failure believed to be secondary to acute on chronic HFpEF exacerbation. During that admission he had a TTE w/ LVEF 50-55% and severe asymmetric septal hypertrophy but no dynamic LVOT gradient. He was diuresed with IV lasix, started on GDMT and discharged home. Since that time he has been seen in [facility] clinic at where jardiance/losartan were continued and coreg was increased to 12.5mg  BID. He has also followed with pulmonology in the interim. Since discharge in 12/23 he has been on chronic 2L home O2 with some improvement in exercise capacity. He had PFTs in 2019 w/o any obstructive disease.   Interval hx:  From a functional standpoint Mark [name] has been doing very well.  He reports that his dyspnea and lower extremity edema have improved significantly.  He underwent sleep study and is awaiting delivery of a CPAP.  He also had a PYP scan and presents today to discuss those results.  Activity level/exercise tolerance: Improved, NYHA IIb Orthopnea:  Sleeps on 2-3 pillows Paroxysmal noctural dyspnea:  no Chest pain/pressure:  no Orthostatic lightheadedness:  no Palpitations:  no Lower extremity edema: No edema today, resolved. Presyncope/syncope:  no Cough:  no  Past Medical History:  Diagnosis Date   Abnormal lung function test    a. Reported to possibly be COPD, but per pulm note: "Possible COPD - PFT was more suggestive of restrictive defect and diffusion defect likely from obesity and CHF"   Chronic diastolic heart failure (HCC) 05/07/2018   CKD (chronic kidney disease), stage III (HCC)    Diabetes mellitus type 2 in obese    Elevated troponin  05/07/2018   Hearing impaired    Hyperlipidemia    Hypertension    LBBB (left bundle branch block)    LVH (left ventricular hypertrophy)    Mild aortic stenosis    Mild pulmonary hypertension (HCC)    Morbid obesity (HCC)    Sleep apnea     Current Outpatient Medications  Medication Sig Dispense Refill   atorvastatin (LIPITOR) 20 MG tablet TAKE 1 TABLET BY MOUTH EVERY DAY 90 tablet 1   BIKTARVY 50-200-25 MG TABS tablet TAKE 1 TABLET BY MOUTH DAILY. TRY TO TAKE AT THE SAME TIME EACH DAY WITH OR WITHOUT FOOD. 30 tablet 2   carvedilol (COREG) 12.5 MG tablet Take 1 tablet (12.5 mg total) by mouth 2 (two) times daily. 180 tablet 0   chlorhexidine (PERIDEX) 0.12 % solution brush on 1 teaspoon ( ) of solution to teeth and gums with tooth brush after pm mouth care . Spit out excess and do not rinse. 473 mL 6   JARDIANCE 10 MG TABS tablet TAKE 1 TABLET BY MOUTH EVERY DAY 30 tablet 3   losartan (COZAAR) 100 MG tablet Take 1 tablet (100 mg total) by mouth daily. 90 tablet 1   metFORMIN (GLUCOPHAGE) 1000 MG tablet TAKE 1 TABLET (1,000 MG TOTAL) BY MOUTH TWICE A DAY WITH FOOD 180 tablet 1   torsemide (DEMADEX) 20 MG tablet Take 2 tablets (40 mg total) by mouth daily. 180 tablet 1   No current facility-administered medications for this encounter.    No Known Allergies    Social History   Socioeconomic History  Marital status: Divorced    Spouse name: Not on file   Number of children: 2   Years of education: Not on file   Highest education level: Not on file  Occupational History   Occupation: self-employed    Comment: recieves disability  Tobacco Use   Smoking status: Former    Years: 1    Types: Cigarettes    Quit date: 1999    Years since quitting: 25.3   Smokeless tobacco: Never   Tobacco comments:    1 pack would last one month--08/01/18  Vaping Use   Vaping Use: Never used  Substance and Sexual Activity   Alcohol use: No   Drug use: No   Sexual activity: Not Currently     Partners: Female    Comment: declied condom  Other Topics Concern   Not on file  Social History Narrative   Not on file   Social Determinants of Health   Financial Resource Strain: Medium Risk (09/21/2022)   Overall Financial Resource Strain (CARDIA)    Difficulty of Paying Living Expenses: Somewhat hard  Food Insecurity: No Food Insecurity (09/21/2022)   Hunger Vital Sign    Worried About Running Out of Food in the Last Year: Never true    Ran Out of Food in the Last Year: Never true  Transportation Needs: No Transportation Needs (09/21/2022)   PRAPARE - Administrator, Civil Service (Medical): No    Lack of Transportation (Non-Medical): No  Physical Activity: Inactive (04/08/2022)   Exercise Vital Sign    Days of Exercise per Week: 0 days    Minutes of Exercise per Session: 0 min  Stress: No Stress Concern Present (04/08/2022)   [employer] of Occupational Health - Occupational Stress Questionnaire    Feeling of Stress : Not at all  Social Connections: Not on file  Intimate Partner Violence: Not At Risk (08/21/2022)   Humiliation, Afraid, Rape, and Kick questionnaire    Fear of Current or Ex-Partner: No    Emotionally Abused: No    Physically Abused: No    Sexually Abused: No      Family History  Problem Relation Age of Onset   Hypertension Mother    Hypertension Father     PHYSICAL EXAM: Vitals:   01/11/23 0943  BP: 108/70  Pulse: 63  SpO2: 98%   GENERAL: Well nourished, well developed, and in no apparent distress at rest.  HEENT: Negative for arcus senilis or xanthelasma. There is no scleral icterus.  The mucous membranes are pink and moist.   NECK: Supple, No masses. Normal carotid upstrokes without bruits. No masses or thyromegaly.    CHEST: There are no chest wall deformities. There is no chest wall tenderness. Respirations are unlabored.  Lungs-CTA bilaterally CARDIAC:  JVP: 8 cm          Normal rate with regular rhythm. No murmurs, rubs or  gallops.  Pulses are 2+ and symmetrical in upper and lower extremities.  No edema.  ABDOMEN: Soft, non-tender, non-distended. There are no masses or hepatomegaly. There are normal bowel sounds.  EXTREMITIES: Warm and well perfused with no cyanosis, clubbing.  LYMPHATIC: No axillary or supraclavicular lymphadenopathy.  NEUROLOGIC: Patient is oriented x3 with no focal or lateralizing neurologic deficits.  PSYCH: Patients affect is appropriate, there is no evidence of anxiety or depression.  SKIN: Warm and dry; no lesions or wounds.    DATA REVIEW  ECG: 08/25/22: NSR with wide LBBB, QRSd of  ECHO:  LVEF 55%, severe asymmetric LVH.   PYP:  12/28/22: Grade II uptake     Latest Ref Rng & Units 05/11/2018   10:13 AM  PFT Results  FVC-Pre L 2.46   FVC-Predicted Pre % 58   FVC-Post L 2.25   FVC-Predicted Post % 53   Pre FEV1/FVC % % 79   Post FEV1/FCV % % 81   FEV1-Pre L 1.94   FEV1-Predicted Pre % 59   FEV1-Post L 1.84   DLCO uncorrected ml/min/mmHg 19.38   DLCO UNC% % 57   DLCO corrected ml/min/mmHg 19.67   DLCO COR %Predicted % 58   DLVA Predicted % 105   TLC L 5.03   TLC % Predicted % 69   RV % Predicted % 99      ASSESSMENT & PLAN:  Heart failure with preserved EF Etiology of ZO:XWRUEA LVH on TTE; although, he has significant HTN, degree of LVH is concerning for infiltrative cardiomyopathy. Will obtain CMR today. PYP scan pending. Myeloma panel previously negative; however, no FLC assay. Will order today.  NYHA class / AHA Stage:IIB Volume status & Diuretics: Euvolemic on exam today Vasodilators:Losartan 100mg  daily Beta-Blocker:coreg 12.5mg  BID; plan to decrease as he has HFpEF MRA: repeat lab work pending; will hold off for the time being.  Cardiometabolic:Jardiance 10mg  Devices therapies & Valvulopathies:CMR pending; if he has HCM will further risk stratify.  Advanced therapies:Not indicated; poor insight.  He has severe OSA with a new O2 requirement. Plan for  echo w/ bubble study to rule out shunting & RHC to evaluate for PH.   2. Evaluation for Cardiac amyloid - PYP scan is grade 1 - Negative myeloma panel (no M spike etc) -At this time I do not think his PYP scan is positive.  LVH is likely secondary to uncontrolled hypertension.  Will plan for cardiac MRI and repeat PYP in 6 months.  3. HTN - D/C losartan - losartan 100mg  daily  4. OSA - untreated, sleep study in 2015 showed severe OSA w/ AHI 41.5  - discussed association between untreated OSA and CV disease/ HTN - he will need repeat sleep study. Will arrange  - RHC to evaluate for PH.    5. Obesity  - Body mass index is 35.93 kg/m. - wt loss advised    6. CKD IIIb  - b/l SCr ~1.4 - noted bump in SCr to 1.95 in ED on 1/13 - repeat BMP today   6. HLD - LDL elevated, 110 mg/dL - on atorvastatin 20  - followed by cardiology    7. HIV - now followed by ID, Dr. Thedore Mins  - continue Biktarvy    Miriana Gaertner Advanced Heart Failure Mechanical Circulatory Support

## 2023-01-12 ENCOUNTER — Ambulatory Visit (INDEPENDENT_AMBULATORY_CARE_PROVIDER_SITE_OTHER): Payer: Medicare HMO | Admitting: Nurse Practitioner

## 2023-01-12 ENCOUNTER — Encounter: Payer: Self-pay | Admitting: Nurse Practitioner

## 2023-01-12 VITALS — BP 120/80 | HR 63 | Temp 98.4°F | Ht 71.0 in | Wt 253.4 lb

## 2023-01-12 DIAGNOSIS — Z1211 Encounter for screening for malignant neoplasm of colon: Secondary | ICD-10-CM

## 2023-01-12 DIAGNOSIS — G4733 Obstructive sleep apnea (adult) (pediatric): Secondary | ICD-10-CM

## 2023-01-12 DIAGNOSIS — I272 Pulmonary hypertension, unspecified: Secondary | ICD-10-CM

## 2023-01-12 DIAGNOSIS — Z2821 Immunization not carried out because of patient refusal: Secondary | ICD-10-CM

## 2023-01-12 DIAGNOSIS — I110 Hypertensive heart disease with heart failure: Secondary | ICD-10-CM | POA: Diagnosis not present

## 2023-01-12 DIAGNOSIS — Z21 Asymptomatic human immunodeficiency virus [HIV] infection status: Secondary | ICD-10-CM | POA: Diagnosis not present

## 2023-01-12 DIAGNOSIS — E1159 Type 2 diabetes mellitus with other circulatory complications: Secondary | ICD-10-CM | POA: Diagnosis not present

## 2023-01-12 DIAGNOSIS — I5032 Chronic diastolic (congestive) heart failure: Secondary | ICD-10-CM | POA: Diagnosis not present

## 2023-01-12 DIAGNOSIS — Z79899 Other long term (current) drug therapy: Secondary | ICD-10-CM

## 2023-01-12 DIAGNOSIS — E782 Mixed hyperlipidemia: Secondary | ICD-10-CM

## 2023-01-12 NOTE — Patient Instructions (Signed)

## 2023-01-13 LAB — LIPID PANEL
Chol/HDL Ratio: 3.7 ratio (ref 0.0–5.0)
Cholesterol, Total: 166 mg/dL (ref 100–199)
HDL: 45 mg/dL (ref >39)
LDL Chol Calc (NIH): 106 mg/dL — ABNORMAL HIGH (ref 0–99)
Triglycerides: 81 mg/dL (ref 0–149)
VLDL Cholesterol Cal: 15 mg/dL (ref 5–40)

## 2023-01-13 LAB — HEMOGLOBIN A1C
Est. average glucose Bld gHb Est-mCnc: 111 mg/dL
Hgb A1c MFr Bld: 5.5 % (ref 4.8–5.6)

## 2023-01-14 ENCOUNTER — Telehealth: Payer: Self-pay

## 2023-01-14 NOTE — Telephone Encounter (Signed)
Called to discuss PREP program, would like to attend but is closer to [place]; will ask Pam RN [facility] to contact him about next class available.

## 2023-01-14 NOTE — Progress Notes (Signed)
Cardiology Office Note:   Date:  01/15/2023  NAME:  Mark [name]    MRN: [id number] DOB:  25-Feb-1961   PCP:  [name], FNP  Cardiologist:  Reatha Harps, MD  Electrophysiologist:  None   Referring MD: [name], FNP   Chief Complaint  Patient presents with   Follow-up   History of Present Illness:   Mark [name] is a 62 y.o. male with a hx of CHF, pHTN, DM, tissue presents for follow-up.  Recently admitted to the hospital.  Has systolic dysfunction.  Has pulmonary hypertension which appears to be mixed.  Has not been reevaluated by pulmonary.  Euvolemic on examination.  He tells me his blood pressure has been normal at home.  BP 158/90.  I would like for him to start checking his blood pressure.  He is euvolemic.  On torsemide 40 mg daily.  He is on [place].  He reports he is feeling better.  Remains on oxygen.  Suspect he may need reevaluation by pulmonary.  I wonder if he has possible interstitial lung disease.  Denies any chest discomfort.  Overall doing much better.  Problem List HFpEF -EF 50-55% 07/2022 -EF 45-50% 10/2022 2. pHTN -mPAP 37 mmHG -TPG 19 mmHG, PVR 4.2 WU 3. HTN 4. DM -A1c 5.5 5. OSA/Restrictive Lung dz -severe OSA -moderate restriction  -severe diffusion defect  6. HIV 7. HLD -T chol 166, HDL 45, LDL 106, TG 81 8. LBBB 9. CKD IIIb  Past Medical History: Past Medical History:  Diagnosis Date   Abnormal lung function test    a. Reported to possibly be COPD, but per pulm note: "Possible COPD - PFT was more suggestive of restrictive defect and diffusion defect likely from obesity and CHF"   CHF (congestive heart failure) (HCC)    Chronic diastolic heart failure (HCC) 05/07/2018   CKD (chronic kidney disease), stage III (HCC)    Diabetes mellitus type 2 in obese    Elevated troponin 05/07/2018   Hearing impaired    Hyperlipidemia    Hypertension    LBBB (left bundle branch block)    LVH (left ventricular hypertrophy)    Mild  aortic stenosis    Mild pulmonary hypertension (HCC)    Morbid obesity (HCC)    Pulmonary hypertension (HCC)    Sleep apnea     Past Surgical History: Past Surgical History:  Procedure Laterality Date   BACK SURGERY     RIGHT HEART CATH N/A 11/06/2022   Procedure: RIGHT HEART CATH;  Surgeon: Dorthula Nettles, DO;  Location: [facility] INVASIVE CV LAB;  Service: Cardiovascular;  Laterality: N/A;   TRACHEOSTOMY      Current Medications: Current Meds  Medication Sig   BIKTARVY 50-200-25 MG TABS tablet TAKE 1 TABLET BY MOUTH DAILY. TRY TO TAKE AT THE SAME TIME EACH DAY WITH OR WITHOUT FOOD.   Blood Pressure Monitoring (BLOOD PRESSURE CUFF) MISC Use to check blood pressure x1 daily   carvedilol (COREG) 12.5 MG tablet Take 1 tablet (12.5 mg total) by mouth 2 (two) times daily.   JARDIANCE 10 MG TABS tablet TAKE 1 TABLET BY MOUTH EVERY DAY   losartan (COZAAR) 100 MG tablet Take 1 tablet (100 mg total) by mouth daily.   metFORMIN (GLUCOPHAGE) 1000 MG tablet TAKE 1 TABLET (1,000 MG TOTAL) BY MOUTH TWICE A DAY WITH FOOD   torsemide (DEMADEX) 20 MG tablet Take 2 tablets (40 mg total) by mouth daily.   [DISCONTINUED] atorvastatin (LIPITOR) 20 MG tablet TAKE  1 TABLET BY MOUTH EVERY DAY   [DISCONTINUED] chlorhexidine (PERIDEX) 0.12 % solution brush on 1 teaspoon ( ) of solution to teeth and gums with tooth brush after pm mouth care . Spit out excess and do not rinse.     Allergies:    Patient has no known allergies.   Social History: Social History   Socioeconomic History   Marital status: Divorced    Spouse name: Not on file   Number of children: 2   Years of education: Not on file   Highest education level: Not on file  Occupational History   Occupation: self-employed    Comment: recieves disability  Tobacco Use   Smoking status: Former    Years: 1    Types: Cigarettes    Quit date: 1999    Years since quitting: 25.3   Smokeless tobacco: Never   Tobacco comments:    1 pack would  last one month--08/01/18  Vaping Use   Vaping Use: Never used  Substance and Sexual Activity   Alcohol use: No   Drug use: No   Sexual activity: Not Currently    Partners: Female    Comment: declied condom  Other Topics Concern   Not on file  Social History Narrative   Not on file   Social Determinants of Health   Financial Resource Strain: Medium Risk (09/21/2022)   Overall Financial Resource Strain (CARDIA)    Difficulty of Paying Living Expenses: Somewhat hard  Food Insecurity: No Food Insecurity (09/21/2022)   Hunger Vital Sign    Worried About Running Out of Food in the Last Year: Never true    Ran Out of Food in the Last Year: Never true  Transportation Needs: No Transportation Needs (09/21/2022)   PRAPARE - Administrator, Civil Service (Medical): No    Lack of Transportation (Non-Medical): No  Physical Activity: Inactive (04/08/2022)   Exercise Vital Sign    Days of Exercise per Week: 0 days    Minutes of Exercise per Session: 0 min  Stress: No Stress Concern Present (04/08/2022)   [employer] of Occupational Health - Occupational Stress Questionnaire    Feeling of Stress : Not at all  Social Connections: Not on file     Family History: The patient's family history includes Hypertension in his father and mother.  ROS:   All other ROS reviewed and negative. Pertinent positives noted in the HPI.     EKGs/Labs/Other Studies Reviewed:   The following studies were personally reviewed by me today:  Recent Labs: 08/26/2022: Magnesium 2.6 10/19/2022: TSH 1.439 11/09/2022: ALT 11; Hemoglobin 12.6; Platelets 183 01/11/2023: B Natriuretic Peptide 86.8; BUN 28; Creatinine, Ser 1.98; Potassium 4.3; Sodium 141   Recent Lipid Panel    Component Value Date/Time   CHOL 166 01/12/2023 1015   TRIG 81 01/12/2023 1015   HDL 45 01/12/2023 1015   CHOLHDL 3.7 01/12/2023 1015   CHOLHDL 4.0 09/09/2022 1110   VLDL 8 05/08/2018 0520   LDLCALC 106 (H) 01/12/2023 1015    LDLCALC 110 (H) 09/09/2022 1110    Physical Exam:   VS:  BP (!) 158/90 (BP Location: Left Arm, Patient Position: Sitting, Cuff Size: Normal)   Pulse 83   Ht 5\' 11"  (1.803 m)   Wt 253 lb (114.8 kg)   SpO2 95%   BMI 35.29 kg/m    Wt Readings from Last 3 Encounters:  01/15/23 253 lb (114.8 kg)  01/12/23 253 lb 6.4 oz (114.9 kg)  01/11/23  257 lb 12.8 oz (116.9 kg)    General: Well nourished, well developed, in no acute distress Head: Atraumatic, normal size  Eyes: PEERLA, EOMI  Neck: Supple, no JVD Endocrine: No thryomegaly Cardiac: Normal S1, S2; RRR; no murmurs, rubs, or gallops Lungs: Clear to auscultation bilaterally, no wheezing, rhonchi or rales  Abd: Soft, nontender, no hepatomegaly  Ext: No edema, pulses 2+ Musculoskeletal: No deformities, BUE and BLE strength normal and equal Skin: Warm and dry, no rashes   Neuro: Alert and oriented to person, place, time, and situation, CNII-XII grossly intact, no focal deficits  Psych: Normal mood and affect   ASSESSMENT:   ZAKORY ZUCCONI is a 62 y.o. male who presents for the following: 1. Chronic diastolic heart failure (HCC)   2. Pulmonary hypertension, unspecified (HCC)   3. Primary hypertension   4. LBBB (left bundle branch block)   5. Mixed hyperlipidemia     PLAN:   1. Chronic diastolic heart failure (HCC) -EF mildly reduced.  Suspect this is hypertensive heart disease.  I agree PYP is likely negative.  Cardiac MRI is pending.  He does have a murmur on exam but suspect he will not have HCM.  I suspect this is a hypertensive heart disease.  Currently on losartan 100 mg daily as well as carvedilol 12.5 mg twice daily.  He will continue to check his blood pressure.  Euvolemic.  On torsemide 40 mg daily.  We will let him follow-up with heart failure clinic.  2. Pulmonary hypertension, unspecified (HCC) -Mixed venous and arterial pulmonary artery hypertension.  He does have a severely reduced DLCO.  He has restrictive pattern.   We will reach out to pulmonary to determine if anything else needs to be done.  He is euvolemic.  I believe his systolic dysfunction is not the issue here.  He may need further pulmonary testing.  3. Primary hypertension -Check blood pressure daily.  We will arrange for him to get a blood pressure cuff.  Continue carvedilol and losartan.  Further titration per heart failure clinic.  4. LBBB (left bundle branch block) -Stable.  5. Mixed hyperlipidemia -Continue Lipitor.  Diabetic.  LDL goal less than 70.      Disposition: Return if symptoms worsen or fail to improve.  Medication Adjustments/Labs and Tests Ordered: Current medicines are reviewed at length with the patient today.  Concerns regarding medicines are outlined above.  No orders of the defined types were placed in this encounter.  Meds ordered this encounter  Medications   Blood Pressure Monitoring (BLOOD PRESSURE CUFF) MISC    Sig: Use to check blood pressure x1 daily    Dispense:  1 each    Refill:  0    Patient Instructions  Medication Instructions:  The current medical regimen is effective;  continue present plan and medications.  *If you need a refill on your cardiac medications before your next appointment, please call your pharmacy*   Follow-Up: At [facility], you and your health needs are our priority.  As part of our continuing mission to provide you with exceptional heart care, we have created designated Provider Care Teams.  These Care Teams include your primary Cardiologist (physician) and Advanced Practice Providers (APPs -  Physician Assistants and Nurse Practitioners) who all work together to provide you with the care you need, when you need it.  We recommend signing up for the patient portal called "MyChart".  Sign up information is provided on this After Visit Summary.  MyChart  is used to connect with patients for Virtual Visits (Telemedicine).  Patients are able to view lab/test results,  encounter notes, upcoming appointments, etc.  Non-urgent messages can be sent to your provider as well.   To learn more about what you can do with MyChart, go to [email or web address].    Your next appointment:   As needed  Provider:   Reatha Harps, MD       Time Spent with Patient: I have spent a total of 25 minutes with patient reviewing hospital notes, telemetry, EKGs, labs and examining the patient as well as establishing an assessment and plan that was discussed with the patient.  > 50% of time was spent in direct patient care.  Signed, Lenna Gilford. Flora Lipps, MD, [facility]  [facility]  [address], [address] [place], [place] [address] [phone]  01/15/2023 9:31 AM

## 2023-01-14 NOTE — Telephone Encounter (Signed)
Called pt to offer next PREP class starting at [facility] on 01/18/23 MW 230p-345p.  Can do that schedule.  Will meet pt at front desk of [facility] and will do intake after class or before Wednesday.

## 2023-01-15 ENCOUNTER — Encounter: Payer: Self-pay | Admitting: Cardiovascular Disease

## 2023-01-15 ENCOUNTER — Ambulatory Visit: Payer: Medicare HMO | Attending: Cardiovascular Disease | Admitting: Cardiovascular Disease

## 2023-01-15 VITALS — BP 158/90 | HR 83 | Ht 71.0 in | Wt 253.0 lb

## 2023-01-15 DIAGNOSIS — I5032 Chronic diastolic (congestive) heart failure: Secondary | ICD-10-CM

## 2023-01-15 DIAGNOSIS — I10 Essential (primary) hypertension: Secondary | ICD-10-CM | POA: Diagnosis not present

## 2023-01-15 DIAGNOSIS — E782 Mixed hyperlipidemia: Secondary | ICD-10-CM | POA: Diagnosis not present

## 2023-01-15 DIAGNOSIS — I272 Pulmonary hypertension, unspecified: Secondary | ICD-10-CM

## 2023-01-15 DIAGNOSIS — I447 Left bundle-branch block, unspecified: Secondary | ICD-10-CM | POA: Diagnosis not present

## 2023-01-15 MED ORDER — BLOOD PRESSURE CUFF MISC: 0 refills | Status: DC

## 2023-01-15 NOTE — Patient Instructions (Signed)
Medication Instructions:  The current medical regimen is effective;  continue present plan and medications.  *If you need a refill on your cardiac medications before your next appointment, please call your pharmacy*   Follow-Up: At [facility], you and your health needs are our priority.  As part of our continuing mission to provide you with exceptional heart care, we have created designated Provider Care Teams.  These Care Teams include your primary Cardiologist (physician) and Advanced Practice Providers (APPs -  Physician Assistants and Nurse Practitioners) who all work together to provide you with the care you need, when you need it.  We recommend signing up for the patient portal called "MyChart".  Sign up information is provided on this After Visit Summary.  MyChart is used to connect with patients for Virtual Visits (Telemedicine).  Patients are able to view lab/test results, encounter notes, upcoming appointments, etc.  Non-urgent messages can be sent to your provider as well.   To learn more about what you can do with MyChart, go to [email or web address].    Your next appointment:   As needed  Provider:   Concord T O'Neal, MD      

## 2023-01-18 ENCOUNTER — Ambulatory Visit: Payer: Self-pay

## 2023-01-18 ENCOUNTER — Telehealth: Payer: Self-pay | Admitting: Pulmonary Disease

## 2023-01-18 DIAGNOSIS — G4733 Obstructive sleep apnea (adult) (pediatric): Secondary | ICD-10-CM

## 2023-01-18 NOTE — Telephone Encounter (Signed)
Call patient  Sleep study result  Date of study: 12/31/2022  Impression: Severe obstructive sleep apnea with severe oxygen desaturations  Recommendation: DME referral  Recommend CPAP therapy for severe obstructive sleep apnea  Trial of CPAP therapy on 16 cm H2O with a Large size [employer] Full Face Simplus mask and heated humidification. With 2 L of oxygen piped in to system.  Encourage weight loss measures  Follow-up in the office 4 to 6 weeks following initiation of treatment

## 2023-01-18 NOTE — Procedures (Signed)
POLYSOMNOGRAPHY  Last, First: Mark, [name] MRN: [id number] Gender: Male Age (years): 50 Weight (lbs): 250 DOB: 1961/08/20 BMI: 35 Primary Care: No PCP Epworth Score: 2 Referring: Tomma Lightning MD Technician: Armen Pickup Interpreting: Tomma Lightning MD Study Type: Split Night CPAP Ordered Study Type: Split Night CPAP Study date: 12/31/2022 Location: [place] CLINICAL INFORMATION Mark [name] is a 62 year old Male and was referred to the sleep center for evaluation of G47.30 Sleep Apnea, Unspecified (780.57). Indications include Obesity, OSA, Snoring.  MEDICATIONS Patient self administered medications include: COREG, METFORMIN. Medications administered during study include No sleep medicine administered.  SLEEP STUDY TECHNIQUE The patient underwent an attended overnight level one polysomnography titration to assess the effects of CPAP therapy. The following variables were monitored: EEG (C4-A1, C3-A2, O1-A2, O2-A1), EOG, submental and leg EMG, ECG, oxyhemoglobin saturation by pulse oximetry, thoracic and abdominal respiratory effort belts, nasal/oral airflow by pressure sensor, body position sensor and snoring sensor. CPAP pressure was titrated to eliminate apneas, hypopneas and oxygen desaturation. Hypopneas were scored per AASM definition IB (4% desaturation)  The NPSG portion of the study ended at 1:52:19 AM . The CPAP titration was initiated at 1:58:40 AM AM with the CPAP portion of the study ending at 4:34:01 AM.  TECHNICIAN COMMENTS Comments added by Technician: Pt went to restroom once. O2 initiated due to low sats. Comments added by Scorer: N/A SLEEP ARCHITECTURE The recording time for the entire night was 359.4 minutes. The diagnostic portion was initiated at 10:34:38 PM and terminated at 1:52:19 AM. The time in bed was 197.7 minutes. EEG confirmed total sleep time was 105.5 minutes yielding a sleep efficiency of 53.4%. Sleep onset after lights out was 64.2  minutes with a REM latency of 79.0 minutes. The patient spent 6.6% of the night in stage N1 sleep, 54.0% in stage N2 sleep, 0.0% in stage N3 and 39.3% in REM. The Arousal Index was 58.6/hour.  The titration portion was initiated at 1:58:40 AM and terminated at 4:34:01 AM. The time in bed was 155.3 minutes. EEG confirmed total sleep time was 135.0 minutes yielding a sleep efficiency of 86.9%. Sleep onset after CPAP initiation was 14.1 minutes with a REM latency of 50.0 minutes. The patient spent 2.6% of the night in stage N1 sleep, 55.9% in stage N2 sleep, 0.0% in stage N3 and 41.5% in REM. The Arousal Index was 34.7/hour. RESPIRATORY PARAMETERS During the diagnostic portion, there were a total of 161 respiratory disturbances recorded; 121 apneas ( 121 obstructive, 0 mixed, 0 central), 11 hypopneas and 29 RERAs. The apnea/hypopnea index 75.1 was events/hour and the RDI was 91.6 events/hour. The central sleep apnea index was 0.0 events/hour. The REM AHI was 63.6 /h and NREM AHI was 82.5/h. The REM RDI was 69.4 /h and NREM RDI was 105.9 /h. The supine AHI was N/A/h, and the non supine AHI was 75.1/h; supine during 0.0% of sleep. The supine RDI was 0.0/h, and the non supine RDI was 91.56/h. Respiratory disturbances were associated with oxygen desaturation down to a nadir of 78.0 % during sleep. The mean oxygen saturation during the study was 92.9%. The cumulative time under 88% oxygen saturation was 8.3 minutes.  During the titration portion, the apnea/hypopnea index (AHI) was 18.7 events/hour and the RDI was 29.3 events/hour. The central sleep apnea index was events/hour. The most appropriate setting of CPAP was IPAP/EPAP 16/16 cm H2O. At this setting, the sleep efficiency was 95% and the patient was supine for 97%. The AHI was 0 events  per hour(with 0 central events). Oxygen nadir was 89.0. LEG MOVEMENT DATA The periodic limb movement index was 0.0/hour with an associated arousal index of /hour. CARDIAC  DATA The underlying cardiac rhythm was most consistent with sinus rhythm. Mean heart rate was 71.1 during diagnostic portion and 63.3 during titration portion of study. Additional rhythm abnormalities include None.  IMPRESSIONS - Severe Obstructive Sleep apnea(OSA) Optimal pressure attained. - EKG showed no cardiac abnormalities. - Severe Oxygen Desaturation - No snoring was audible during this study. - EEG did not show alpha intrusion. - No significant periodic leg movements(PLMs) during sleep. However, no significant associated arousals. Loraine Leriche reduced sleep efficiency, long primary sleep latency, short REM sleep latency and no slow wave latency.  DIAGNOSIS - Obstructive Sleep Apnea (G47.33)  RECOMMENDATIONS - Trial of CPAP therapy on 16 cm H2O with a Large size Fisher&Paykel Full Face Simplus mask and heated humidification. With 2 L of oxygen piped in to system. - Avoid alcohol, sedatives and other CNS depressants that may worsen sleep apnea and disrupt normal sleep architecture. - Sleep hygiene should be reviewed to assess factors that may improve sleep quality. - Weight management and regular exercise should be initiated or continued. - Return to Sleep Center for re-evaluation after 4 weeks of therapy  [Electronically signed] 01/18/2023 06:03 AM  Virl Diamond MD NPI: [id number]

## 2023-01-18 NOTE — Patient Outreach (Signed)
  Care Coordination   Follow Up Visit Note   01/18/2023 Name: Mark [name] MRN: [id number] DOB: 1961-03-29  Mark [name] is a 62 y.o. year old male who sees [name], FNP for primary care. I spoke with  Mark [name] by phone today.  What matters to the patients health and wellness today?  Patient would like to receive his sleep study results. He will start the PREP program today.     Goals Addressed             This Visit's Progress    I would like to lose weight and be more physically active       Care Coordination Interventions: Received inbound call from patient stating he would like to participate in the PREP program Discussed and reviewed recent PCP referral was placed for PREP Determined patient will start this program today, 01/18/23 at [name] working with Viann Fish RN, he will attend on MW from 2:30-3:45 PM Positive reinforcement provided to patient for making efforts to improve his overall health      To complete a sleep study to diagnose obstructive sleep apnea       Care Coordination Interventions: Evaluation of current treatment plan related to OSA and patient's adherence to plan as established by provider Reviewed and discussed with patient, he completed a recent sleep study and is awaiting on his results Advised patient per note reviewed today, he should be receiving a call to discuss his results and MD recommendations and he verbalizes understanding    Interventions Today    Flowsheet Row Most Recent Value  Chronic Disease   Chronic disease during today's visit Other  [OSA]  General Interventions   General Interventions Discussed/Reviewed General Interventions Discussed, General Interventions Reviewed, Durable Medical Equipment (DME), Doctor Visits  Doctor Visits Discussed/Reviewed Doctor Visits Discussed, Doctor Visits Reviewed, Specialist, PCP  Durable Medical Equipment (DME) Other, Oxygen  [CPAP]  Exercise Interventions   Exercise  Discussed/Reviewed Exercise Discussed, Exercise Reviewed, Physical Activity  Physical Activity Discussed/Reviewed Physical Activity Reviewed, Physical Activity Discussed, PREP  Education Interventions   Education Provided Provided Education  Provided Verbal Education On Exercise          SDOH assessments and interventions completed:  No     Care Coordination Interventions:  Yes, provided   Follow up plan: Follow up call scheduled for 03/22/23 @10 :30 AM    Encounter Outcome:  Pt. Visit Completed

## 2023-01-18 NOTE — Patient Instructions (Signed)
Visit Information  Thank you for taking time to visit with me today. Please don't hesitate to contact me if I can be of assistance to you.   Following are the goals we discussed today:   Goals Addressed             This Visit's Progress    I would like to lose weight and be more physically active       Care Coordination Interventions: Received inbound call from patient stating he would like to participate in the PREP program Discussed and reviewed recent PCP referral was placed for PREP Determined patient will start this program today, 01/18/23 at [place] working with Viann Fish RN, he will attend on MW from 2:30-3:45 PM Positive reinforcement provided to patient for making efforts to improve his overall health      To complete a sleep study to diagnose obstructive sleep apnea       Care Coordination Interventions: Evaluation of current treatment plan related to OSA and patient's adherence to plan as established by provider Reviewed and discussed with patient, he completed a recent sleep study and is awaiting on his results Advised patient per note reviewed today, he should be receiving a call to discuss his results and MD recommendations and he verbalizes understanding        Our next appointment is by telephone on 03/22/23 at 10:30 AM  Please call the care guide team at [phone] if you need to cancel or reschedule your appointment.   If you are experiencing a Mental Health or Behavioral Health Crisis or need someone to talk to, please call 1-800-273-TALK (toll free, 24 hour hotline) go to [facility] [address], [place] [phone])  The patient verbalized understanding of instructions, educational materials, and care plan provided today and DECLINED offer to receive copy of patient instructions, educational materials, and care plan.   Delsa Sale, RN, BSN, CCM Care Management Coordinator [facility] Care Management Direct Phone:  [phone]

## 2023-01-18 NOTE — Progress Notes (Unsigned)
[facility] PREP Evaluation  Patient Details  Name: Mark [name] MRN: [id number] Date of Birth: 08-03-1961 Age: 62 y.o. PCP: Mark Felts, FNP  Vitals:   01/18/23 1606  BP: 130/82  Pulse: 69  SpO2: 97%  Weight: 256 lb 9.6 oz (116.4 kg)     [facility] Eval - 01/18/23 1600       [facility] "PREP" Location   [facility] "PREP" Location [facility]      Referral    Referring Provider Mark [name]    Reason for referral Inactivity;Obesitity/Overweight;Heart Failure;Diabetes;Hypertension    Program Start Date 01/18/23   MW 230p-345p x 12 wks     Measurement   Waist Circumference 53 inches    Hip Circumference 48 inches    Body fat 33.1 percent      Information for Trainer   Goals goal weight: 240, Improve lungs and heart    Current Exercise walks to mailbox every day, has stationary bike at home    Orthopedic Concerns None    Pertinent Medical History HTN, pulm HTN, CHF, DM2, CRF, O2 2l [place]    Current Barriers none    Restrictions/Precautions Diabetic snack before exercise    Medications that affect exercise Medication causing dizziness/drowsiness      Timed Up and Go (TUGS)   Timed Up and Go Low risk <9 seconds      Mobility and Daily Activities   I find it easy to walk up or down two or more flights of stairs. 2    I have no trouble taking out the trash. 4    I do housework such as vacuuming and dusting on my own without difficulty. 4    I can easily lift a gallon of milk (8lbs). 4    I can easily walk a mile. 1    I have no trouble reaching into high cupboards or reaching down to pick up something from the floor. 4    I do not have trouble doing out-door work such as Loss adjuster, chartered, raking leaves, or gardening. 1      Mobility and Daily Activities   I feel younger than my age. 2    I feel independent. 4    I feel energetic. 2    I live an active life.  2    I feel strong. 2    I feel healthy. 2    I feel active as other people my age. 4      How fit and strong are you.   Fit and  Strong Total Score 38            Past Medical History:  Diagnosis Date   Abnormal lung function test    a. Reported to possibly be COPD, but per pulm note: "Possible COPD - PFT was more suggestive of restrictive defect and diffusion defect likely from obesity and CHF"   CHF (congestive heart failure) (HCC)    Chronic diastolic heart failure (HCC) 05/07/2018   CKD (chronic kidney disease), stage III (HCC)    Diabetes mellitus type 2 in obese    Elevated troponin 05/07/2018   Hearing impaired    Hyperlipidemia    Hypertension    LBBB (left bundle branch block)    LVH (left ventricular hypertrophy)    Mild aortic stenosis    Mild pulmonary hypertension (HCC)    Morbid obesity (HCC)    Pulmonary hypertension (HCC)    Sleep apnea    Past Surgical History:  Procedure Laterality  Date   BACK SURGERY     RIGHT HEART CATH N/A 11/06/2022   Procedure: RIGHT HEART CATH;  Surgeon: Mark Nettles, DO;  Location: [facility];  Service: Cardiovascular;  Laterality: N/A;   TRACHEOSTOMY     Social History   Tobacco Use  Smoking Status Former   Years: 1   Types: Cigarettes   Quit date: 1999   Years since quitting: 25.4  Smokeless Tobacco Never  Tobacco Comments   1 pack would last one month--08/01/18    Mark [name] 01/18/2023, 4:11 PM

## 2023-01-20 MED ORDER — ATORVASTATIN CALCIUM 10 MG PO TABS
10.0000 mg | ORAL_TABLET | Freq: Every day | ORAL | 3 refills | Status: DC
Start: 2023-01-20 — End: 2023-07-15

## 2023-01-20 NOTE — Telephone Encounter (Signed)
Spoke with patient regarding sleep study  Call patient   Sleep study result   Date of study: 12/31/2022   Impression: Severe obstructive sleep apnea with severe oxygen desaturations   Recommendation: DME referral   Recommend CPAP therapy for severe obstructive sleep apnea   Trial of CPAP therapy on 16 cm H2O with a Large size Fisher&Paykel Full Face Simplus mask and heated humidification. With 2 L of oxygen piped in to system.   Encourage weight loss measures   Follow-up in the office 4 to 6 weeks following initiation of treatment Advised patient I will put a order in and to have patient contact our office once he get's his CPAP. Patient's voice was understanding.Noting else further needed.

## 2023-01-21 NOTE — Progress Notes (Unsigned)
[facility] PREP Weekly Session  Patient Details  Name: Mark [name] MRN: [id number] Date of Birth: [birth year] Age: 62 y.o. PCP: [name], FNP  There were no vitals filed for this visit.   [facility] Weekly seesion - 01/21/23 1200       [facility] "PREP" Location   [facility] "PREP" Location [facility]      Weekly Session   Topic Discussed Goal setting and welcome to the program   Fit testing   Classes attended to date 2             [name] 01/21/2023, 12:10 PM

## 2023-01-28 ENCOUNTER — Other Ambulatory Visit (INDEPENDENT_AMBULATORY_CARE_PROVIDER_SITE_OTHER): Payer: Medicare HMO | Admitting: Nurse Practitioner

## 2023-01-28 DIAGNOSIS — E1122 Type 2 diabetes mellitus with diabetic chronic kidney disease: Secondary | ICD-10-CM | POA: Diagnosis not present

## 2023-01-28 DIAGNOSIS — M1A40X0 Other secondary chronic gout, unspecified site, without tophus (tophi): Secondary | ICD-10-CM

## 2023-01-28 DIAGNOSIS — Z794 Long term (current) use of insulin: Secondary | ICD-10-CM | POA: Diagnosis not present

## 2023-01-28 DIAGNOSIS — I5032 Chronic diastolic (congestive) heart failure: Secondary | ICD-10-CM | POA: Diagnosis not present

## 2023-01-28 DIAGNOSIS — G4733 Obstructive sleep apnea (adult) (pediatric): Secondary | ICD-10-CM | POA: Diagnosis not present

## 2023-01-28 DIAGNOSIS — I350 Nonrheumatic aortic (valve) stenosis: Secondary | ICD-10-CM

## 2023-01-28 DIAGNOSIS — I272 Pulmonary hypertension, unspecified: Secondary | ICD-10-CM

## 2023-01-28 DIAGNOSIS — N1831 Chronic kidney disease, stage 3a: Secondary | ICD-10-CM | POA: Diagnosis not present

## 2023-01-28 DIAGNOSIS — I110 Hypertensive heart disease with heart failure: Secondary | ICD-10-CM

## 2023-01-28 DIAGNOSIS — M1A20X0 Drug-induced chronic gout, unspecified site, without tophus (tophi): Secondary | ICD-10-CM

## 2023-01-28 NOTE — Progress Notes (Signed)
Chief Complaint  Patient presents with   home health initial certification   Received home health orders orders from [facility]. Start of care 09/10/2022.   Certification and orders from 09/10/2022 through 11/08/2022 are reviewed, signed and faxed back to home health company.  Need of intermittent skilled services at home: SN, PT  The home health care plan has been established by me and will be reviewed and updated as needed to maximize patient recovery.  I certify that all home health services have been and will be furnished to the patient while under my care.  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

## 2023-02-01 ENCOUNTER — Encounter (HOSPITAL_BASED_OUTPATIENT_CLINIC_OR_DEPARTMENT_OTHER): Payer: Medicare HMO | Admitting: Pulmonary Disease

## 2023-02-02 NOTE — Progress Notes (Signed)
[facility] PREP Weekly Session  Patient Details  Name: Mark [name] MRN: [id number] Date of Birth: 02-08-1961 Age: 62 y.o. PCP: [name], FNP  Vitals:   02/01/23 1430  Weight: 252 lb (114.3 kg)     [facility] Weekly seesion - 02/02/23 1100       [facility] "PREP" Location   [facility] "PREP" Location [facility]      Weekly Session   Topic Discussed Importance of resistance training;Other ways to be active    Minutes exercised this week 65 minutes    Classes attended to date 4             Pam Jerral Bonito 02/02/2023, 11:41 AM

## 2023-02-08 NOTE — Progress Notes (Signed)
[facility] PREP Weekly Session  Patient Details  Name: Mark [name] MRN: [id number] Date of Birth: Jul 14, 1961 Age: 62 y.o. PCP: [name], FNP  Vitals:   02/08/23 1430  Weight: 250 lb (113.4 kg)     [facility] Weekly seesion - 02/08/23 1500       [facility] "PREP" Location   [facility] "PREP" Location [facility]      Weekly Session   Topic Discussed Healthy eating tips    Minutes exercised this week 90 minutes    Classes attended to date 6             [name] 02/08/2023, 3:56 PM

## 2023-02-09 ENCOUNTER — Encounter: Payer: Self-pay | Admitting: Internal Medicine

## 2023-02-09 ENCOUNTER — Other Ambulatory Visit: Payer: Self-pay

## 2023-02-09 ENCOUNTER — Ambulatory Visit (INDEPENDENT_AMBULATORY_CARE_PROVIDER_SITE_OTHER): Payer: Medicare HMO | Admitting: Internal Medicine

## 2023-02-09 VITALS — BP 141/90 | HR 65 | Temp 98.6°F | Resp 16 | Wt 256.0 lb

## 2023-02-09 DIAGNOSIS — Z23 Encounter for immunization: Secondary | ICD-10-CM | POA: Diagnosis not present

## 2023-02-09 DIAGNOSIS — B20 Human immunodeficiency virus [HIV] disease: Secondary | ICD-10-CM | POA: Diagnosis not present

## 2023-02-09 NOTE — Progress Notes (Signed)
[facility]     Mark [name] is a 62 y.o. male male Dx HIV on 12/23.  Hx He was hospitalized in December for HF exacerbation found to e HIV+ 12/26 cd4 574, VL 15.1K. Pt reports he was sexually active about four years ago. With ex wife. Reports that wife was tested and states it was negative.  Today 02/09/23: No missed doses of biktarvy. Partner broke up with her because pt has not been sexually active with her. He has not told her about the diagnosis.  ART exposure none Past OIs Risk factors: heterosexual Social: Occupation: Paediatric nurse Housing: apt by himself Support: has family, none know about HIV diagnosis Understanding of HIV: poor Etoh/drug/tobacco use: no/no/no  Past Medical History:  Diagnosis Date   Abnormal lung function test    a. Reported to possibly be COPD, but per pulm note: "Possible COPD - PFT was more suggestive of restrictive defect and diffusion defect likely from obesity and CHF"   CHF (congestive heart failure) (HCC)    Chronic diastolic heart failure (HCC) 05/07/2018   CKD (chronic kidney disease), stage III (HCC)    Diabetes mellitus type 2 in obese    Elevated troponin 05/07/2018   Hearing impaired    Hyperlipidemia    Hypertension    LBBB (left bundle branch block)    LVH (left ventricular hypertrophy)    Mild aortic stenosis    Mild pulmonary hypertension (HCC)    Morbid obesity (HCC)    Pulmonary hypertension (HCC)    Sleep apnea     Past Surgical History:  Procedure Laterality Date   BACK SURGERY     RIGHT HEART CATH N/A 11/06/2022   Procedure: RIGHT HEART CATH;  Surgeon: Dorthula Nettles, DO;  Location: [facility];  Service: Cardiovascular;  Laterality: N/A;   TRACHEOSTOMY      Family History  Problem Relation Age of Onset   Hypertension Mother    Hypertension Father    Current Outpatient Medications on File Prior to Visit  Medication Sig Dispense Refill   atorvastatin (LIPITOR) 10 MG  tablet Take 1 tablet (10 mg total) by mouth daily. 30 tablet 3   BIKTARVY 50-200-25 MG TABS tablet TAKE 1 TABLET BY MOUTH DAILY. TRY TO TAKE AT THE SAME TIME EACH DAY WITH OR WITHOUT FOOD. 30 tablet 2   Blood Pressure Monitoring (BLOOD PRESSURE CUFF) MISC Use to check blood pressure x1 daily 1 each 0   JARDIANCE 10 MG TABS tablet TAKE 1 TABLET BY MOUTH EVERY DAY 30 tablet 3   losartan (COZAAR) 100 MG tablet Take 1 tablet (100 mg total) by mouth daily. 90 tablet 1   metFORMIN (GLUCOPHAGE) 1000 MG tablet TAKE 1 TABLET (1,000 MG TOTAL) BY MOUTH TWICE A DAY WITH FOOD 180 tablet 1   torsemide (DEMADEX) 20 MG tablet Take 2 tablets (40 mg total) by mouth daily. 180 tablet 1   carvedilol (COREG) 12.5 MG tablet Take 1 tablet (12.5 mg total) by mouth 2 (two) times daily. 180 tablet 0   No current facility-administered medications on file prior to visit.    No Known Allergies    Lab Results HIV 1 RNA Quant  Date Value  11/09/2022 Not Detected Copies/mL  09/28/2022 Not Detected Copies/mL  09/09/2022 5,490 copies/mL (H)   CD4 T Cell Abs (/uL)  Date Value  11/09/2022 807  09/28/2022 552  09/09/2022 334 (L)   No results found for: "HIV1GENOSEQ" Lab Results  Component Value Date   WBC 5.5 11/09/2022   HGB 12.6 (L) 11/09/2022   HCT 38.6 11/09/2022   MCV 90.4 11/09/2022   PLT 183 11/09/2022    Lab Results  Component Value Date   CREATININE 1.98 (H) 01/11/2023   BUN 28 (H) 01/11/2023   NA 141 01/11/2023   K 4.3 01/11/2023   CL 103 01/11/2023   CO2 29 01/11/2023   Lab Results  Component Value Date   ALT 11 11/09/2022   AST 12 11/09/2022   ALKPHOS 91 10/19/2022   BILITOT 0.6 11/09/2022    Lab Results  Component Value Date   CHOL 166 01/12/2023   TRIG 81 01/12/2023   HDL 45 01/12/2023   LDLCALC 106 (H) 01/12/2023   Lab Results  Component Value Date   HAV Reactive (A) 08/25/2022   Lab Results  Component Value Date   HEPBSAG NON REACTIVE 08/25/2022   No results found for:  "HCVAB" Lab Results  Component Value Date   CHLAMYDIAWP Negative 09/09/2022   N Negative 09/09/2022   No results found for: "GCPROBEAPT" No results found for: "QUANTGOLD"  Assessment/Plan #HIV #HIV -12/24 HIV+ 15.1 K VL, cd4 574. 1/9 labs VL 5490, cd4 334. Seen in clinic on 1/10 and started on biktarvy.  -CD4 807,  VLND, on 11/09/22 Plan: -Continue biktarvy, pt reports 100% adherence -HIV labs, F/U in 6 month     #Cardiac amyloid #HFpEF -MRI in 6 months for cardiac amylid -On meds for HF -Seen by cards on 01/11/23  #Vaccination COVID bososter 04/13/22 Flu 04/13/22 Monkeypox PCV 20 04/13/22 Meningitis-needs HepA-immune  HEpB(sab nr, sag nr, ab Nr), needs hbv vaccine-1st dose 09/28/22, 2nd dose Tdap unk Shingles needs   #Health maintenance -Quantiferon order today 02/09/23 -RPR NR 08/26/22 -HCV NR 08/23/22 -Oral  GC NR on 09/09/22 -Colonoscopy-refer to GI for colnoscopy(referred in the past)    Danelle Earthly, MD [facility]  I have personally s35ent on the day of the visit. Professional time spent includes the following activities: Preparing to see the patient (review of tests), Obtaining and/or reviewing separately obtained history (admission/discharge record), Performing a medically appropriate examination and/or evaluation , Ordering medications/tests/procedures, referring and communicating with other health care professionals, Documenting clinical information in the EMR, Independently interpreting results (not separately reported), Communicating results to the patient/family/caregiver, Counseling and educating the patient/family/caregiver and Care coordination (not separately reported).

## 2023-02-15 NOTE — Progress Notes (Signed)
[facility] PREP Weekly Session  Patient Details  Name: Mark [name] MRN: [id number] Date of Birth: [birth year] Age: 62 y.o. PCP: [name], FNP  Vitals:   02/15/23 1517  Weight: 253 lb (114.8 kg)     [facility] Weekly seesion - 02/15/23 1500       [facility] "PREP" Location   [facility] "PREP" Location [facility]      Weekly Session   Topic Discussed Health habits    Minutes exercised this week 90 minutes    Classes attended to date 7             Pam Jerral Bonito 02/15/2023, 3:18 PM

## 2023-02-18 ENCOUNTER — Ambulatory Visit (HOSPITAL_COMMUNITY)
Admission: EM | Admit: 2023-02-18 | Discharge: 2023-02-18 | Disposition: A | Discharge status: Home / Self Care | Payer: Medicare HMO | Attending: Sports Medicine | Admitting: Sports Medicine

## 2023-02-18 ENCOUNTER — Other Ambulatory Visit: Payer: Self-pay

## 2023-02-18 ENCOUNTER — Encounter (HOSPITAL_COMMUNITY): Payer: Self-pay | Admitting: Emergency Medicine

## 2023-02-18 DIAGNOSIS — M7989 Other specified soft tissue disorders: Secondary | ICD-10-CM | POA: Diagnosis not present

## 2023-02-18 DIAGNOSIS — Z8739 Personal history of other diseases of the musculoskeletal system and connective tissue: Secondary | ICD-10-CM | POA: Diagnosis not present

## 2023-02-18 MED ORDER — METHYLPREDNISOLONE 4 MG PO TBPK: ORAL_TABLET | ORAL | 0 refills | Status: DC

## 2023-02-18 NOTE — ED Provider Notes (Signed)
[facility]    CSN: [id number] Arrival date & time: 02/18/23  4098      History   Chief Complaint Chief Complaint  Patient presents with   Hand Problem    Pt c/o left hand swollen and painful, endorsed Hx of Gout.    HPI Mark [name] is a 62 y.o. male.   He is here today with chief complaint of some swelling in his right hand greatest in the third finger, knuckle.  He reports he noticed the swelling yesterday and the tenderness.  He does have a history of gout and says it feels similar to when he had gout in his foot.  He is requesting medication at this time.  He does not recall which medication he took for his last gout flare but states he was given some medicine here at the urgent care.  He denies any fevers or chills or injury to his hand.  He has some decreased range of motion secondary to the pain and swelling.     Past Medical History:  Diagnosis Date   Abnormal lung function test    a. Reported to possibly be COPD, but per pulm note: "Possible COPD - PFT was more suggestive of restrictive defect and diffusion defect likely from obesity and CHF"   CHF (congestive heart failure) (HCC)    Chronic diastolic heart failure (HCC) 05/07/2018   CKD (chronic kidney disease), stage III (HCC)    Diabetes mellitus type 2 in obese    Elevated troponin 05/07/2018   Hearing impaired    Hyperlipidemia    Hypertension    LBBB (left bundle branch block)    LVH (left ventricular hypertrophy)    Mild aortic stenosis    Mild pulmonary hypertension (HCC)    Morbid obesity (HCC)    Pulmonary hypertension (HCC)    Sleep apnea     Patient Active Problem List   Diagnosis Date Noted   Hypertensive heart disease with chronic diastolic congestive heart failure (HCC) 01/12/2023   Pulmonary hypertension (HCC) 01/12/2023   Asymptomatic HIV infection, with no history of HIV-related illness (HCC) 01/12/2023   HFrEF (heart failure with reduced ejection fraction) (HCC)  10/16/2022   Chronic respiratory failure with hypoxia (HCC) 10/16/2022   Acute on chronic diastolic (congestive) heart failure (HCC) 08/21/2022   OSA (obstructive sleep apnea) 08/21/2022   Thyroid nodule 01/15/2020   PAD (peripheral artery disease) (HCC) 05/28/2019   DM2 (diabetes mellitus, type 2) (HCC) 09/26/2018   Obesity (BMI 30-39.9) 09/26/2018   Chronic diastolic heart failure (HCC) 05/07/2018   CKD (chronic kidney disease), stage III (HCC) 05/07/2018   Cardiomegaly    Encntr for general adult medical exam w/o abnormal findings 04/25/2018   Hearing loss 04/25/2018   LBBB (left bundle branch block) 04/25/2018   Hyperlipidemia 04/25/2018   Other long term (current) drug therapy 04/25/2018   Other specified disorders of pigmentation 04/25/2018   Personal history of noncompliance with medical treatment, presenting hazards to health 04/25/2018   Sensorineural hearing loss (SNHL) of left ear 06/14/2017   Essential hypertension 06/05/2013   Pure hypercholesterolemia 06/05/2013   Vitamin D deficiency 06/05/2013   Corns and callosity 11/03/2010    Past Surgical History:  Procedure Laterality Date   BACK SURGERY     RIGHT HEART CATH N/A 11/06/2022   Procedure: RIGHT HEART CATH;  Surgeon: Dorthula Nettles, DO;  [facility];  Service: Cardiovascular;  Laterality: N/A;   TRACHEOSTOMY  Home Medications    Prior to Admission medications   Medication Sig Start Date End Date Taking? Authorizing Provider  methylPREDNISolone (MEDROL DOSEPAK) 4 MG TBPK tablet Take as advised on packaging 02/18/23  Yes Gurnoor Ursua A, DO  atorvastatin (LIPITOR) 10 MG tablet Take 1 tablet (10 mg total) by mouth daily. 01/20/23 01/20/24  [name], FNP  BIKTARVY 50-200-25 MG TABS tablet TAKE 1 TABLET BY MOUTH DAILY. TRY TO TAKE AT THE SAME TIME EACH DAY WITH OR WITHOUT FOOD. 12/07/22   Danelle Earthly, MD  Blood Pressure Monitoring (BLOOD PRESSURE CUFF) MISC Use to check blood  pressure x1 daily 01/15/23   O'Neal, Ronnald Ramp, MD  carvedilol (COREG) 12.5 MG tablet Take 1 tablet (12.5 mg total) by mouth 2 (two) times daily. 10/23/22 01/21/23  [name], FNP  JARDIANCE 10 MG TABS tablet TAKE 1 TABLET BY MOUTH EVERY DAY 01/08/23   Ronney Asters, NP  losartan (COZAAR) 100 MG tablet Take 1 tablet (100 mg total) by mouth daily. 10/23/22   [name], FNP  metFORMIN (GLUCOPHAGE) 1000 MG tablet TAKE 1 TABLET (1,000 MG TOTAL) BY MOUTH TWICE A DAY WITH FOOD 10/23/22   [name], FNP  torsemide (DEMADEX) 20 MG tablet Take 2 tablets (40 mg total) by mouth daily. 10/23/22   [name], FNP    Family History Family History  Problem Relation Age of Onset   Hypertension Mother    Hypertension Father     Social History Social History   Tobacco Use   Smoking status: Former    Years: 1    Types: Cigarettes    Quit date: 1999    Years since quitting: 25.4   Smokeless tobacco: Never   Tobacco comments:    1 pack would last one month--08/01/18  Vaping Use   Vaping Use: Never used  Substance Use Topics   Alcohol use: No   Drug use: No     Allergies   Patient has no known allergies.   Review of Systems Review of Systems as listed above in HPI   Physical Exam Triage Vital Signs ED Triage Vitals  Enc Vitals Group     BP 02/18/23 0814 (!) 145/83     Pulse Rate 02/18/23 0814 72     Resp 02/18/23 0814 18     Temp 02/18/23 0814 98.3 F (36.8 C)     Temp Source 02/18/23 0814 Oral     SpO2 02/18/23 0814 94 %     Weight 02/18/23 0815 253 lb 8.5 oz (115 kg)     Height 02/18/23 0815 5\' 11"  (1.803 m)     Head Circumference --      Peak Flow --      Pain Score 02/18/23 0815 8     Pain Loc --      Pain Edu? --      Excl. in GC? --    No data found.  Updated Vital Signs BP (!) 145/83 (BP Location: Right Arm)   Pulse 72   Temp 98.3 F (36.8 C) (Oral)   Resp 18   Ht 5\' 11"  (1.803 m)   Wt 115 kg   SpO2 94%   BMI 35.36 kg/m   Physical  Exam Vitals reviewed.  Constitutional:      General: He is not in acute distress.    Appearance: Normal appearance. He is obese. He is not ill-appearing, toxic-appearing or diaphoretic.  Pulmonary:     Effort: No respiratory distress.  Comments: Supplemental oxygen, nasal cannula in place Skin:    General: Skin is warm.  Neurological:     Mental Status: He is alert.   Right hand: Some swelling, erythema and warmth over the dorsal aspect of the hand greatest swelling around the third PIP and MCP.  Decreased range of motion flexion of the third MCP secondary to pain.  Radial pulse 2+.  Tenderness to palpation on the dorsum of the hand.  No lesion or skin breakdown seen.   UC Treatments / Results  Labs (all labs ordered are listed, but only abnormal results are displayed) Labs Reviewed - No data to display  EKG   Radiology No results found.  Procedures Procedures (including critical care time)  Medications Ordered in UC Medications - No data to display  Initial Impression / Assessment and Plan / UC Course  I have reviewed the triage vital signs and the nursing notes.  Pertinent labs & imaging results that were available during my care of the patient were reviewed by me and considered in my medical decision making (see chart for details).     Suspected gout flare in the hand I sent to his pharmacy short course Medrol Dosepak.  Would avoid NSAIDs and a history of chronic kidney disease.  His fasting glucose this morning was 98 per his report.  Recommend he follow-up with his primary care provider if symptoms worsen or fail to improve.  He verbalized understanding. Final Clinical Impressions(s) / UC Diagnoses   Final diagnoses:  Swelling of digit of hand  History of gout     Discharge Instructions      The swelling in your hand and pain is likely secondary to a gout flare.  I sent to your pharmacy Medrol Dosepak to take as directed on the packaging for the next 6  days.  Follow-up with your primary care provider next week   ED Prescriptions     Medication Sig Dispense Auth. Provider   methylPREDNISolone (MEDROL DOSEPAK) 4 MG TBPK tablet Take as advised on packaging 1 each Gillermo Murdoch A, DO      PDMP not reviewed this encounter.   Claudie Leach, DO 02/18/23 [phone]

## 2023-02-18 NOTE — ED Triage Notes (Signed)
Pt c/o left hand swollen and painful, endorsed Hx of Gout.

## 2023-02-18 NOTE — Discharge Instructions (Addendum)
The swelling in your hand and pain is likely secondary to a gout flare.  I sent to your pharmacy Medrol Dosepak to take as directed on the packaging for the next 6 days.  Follow-up with your primary care provider next week

## 2023-02-21 ENCOUNTER — Other Ambulatory Visit: Payer: Self-pay | Admitting: Internal Medicine

## 2023-02-21 DIAGNOSIS — B20 Human immunodeficiency virus [HIV] disease: Secondary | ICD-10-CM

## 2023-02-23 NOTE — Progress Notes (Signed)
[facility] Weekly Session  Patient Details  Name: Mark [name] MRN: [id number] Date of Birth: 10/30/1960 Age: 62 y.o. PCP: [name], FNP  Vitals:   02/22/23 1430  Weight: 250 lb (113.4 kg)     [facility] Weekly seesion - 02/23/23 1100       [facility]" Location   [facility]      Weekly Session   Topic Discussed Restaurant Eating   Salt and sugar demos   Minutes exercised this week 150 minutes    Classes attended to date 10             [name] 02/23/2023, 11:39 AM

## 2023-03-01 NOTE — Progress Notes (Signed)
[facility] PRE[facility] Weekly Session  Patient Details  Name: Mark [name] MRN: [id number] Date of Birth: 1960/09/30 Age: 62 y.o. PCP: [name], FNP  Vitals:     [facility] Weekly seesion - 03/01/23 1500       [facility]" Location   [facility]      Weekly Session   Topic Discussed Stress management and problem solving   progressive relaxation meditation   Minutes exercised this week --   2 miles, 4 sets of weights   Classes attended to date 12             [name] 03/01/2023, 3:31 PM

## 2023-03-03 ENCOUNTER — Ambulatory Visit (HOSPITAL_COMMUNITY)
Admission: EM | Admit: 2023-03-03 | Discharge: 2023-03-03 | Disposition: A | Discharge status: Home / Self Care | Payer: Medicare HMO | Attending: Emergency Medicine | Admitting: Emergency Medicine

## 2023-03-03 ENCOUNTER — Encounter (HOSPITAL_COMMUNITY): Payer: Self-pay | Admitting: Emergency Medicine

## 2023-03-03 DIAGNOSIS — M109 Gout, unspecified: Secondary | ICD-10-CM | POA: Diagnosis not present

## 2023-03-03 LAB — URIC ACID: Uric Acid, Serum: 7.9 mg/dL (ref 3.7–8.6)

## 2023-03-03 MED ORDER — DEXAMETHASONE SODIUM PHOSPHATE 10 MG/ML IJ SOLN
INTRAMUSCULAR | Status: AC
  Filled 2023-03-03: qty 1

## 2023-03-03 MED ORDER — PREDNISONE 20 MG PO TABS: 40.0000 mg | ORAL_TABLET | Freq: Every day | ORAL | 0 refills | Status: AC

## 2023-03-03 MED ORDER — DEXAMETHASONE SODIUM PHOSPHATE 10 MG/ML IJ SOLN
10.0000 mg | Freq: Once | INTRAMUSCULAR | Status: AC
  Administered 2023-03-03: 10 mg via INTRAMUSCULAR

## 2023-03-03 NOTE — ED Triage Notes (Signed)
Pt c/o right foot pain and left middle finger pain since Monday. Reports feels like gout. Hasn't taken medications for pain

## 2023-03-03 NOTE — ED Provider Notes (Signed)
[facility]    CSN: [id number] Arrival date & time: 03/03/23  0804      History   Chief Complaint Chief Complaint  Patient presents with   Foot Pain    HPI Mark [name] is a 62 y.o. male.   Patient reports a gout flareup in his left middle finger and the ball of his right foot.  He was previously seen here about 2 weeks ago for a presumed gout flare in his right middle finger and given a Medrol dose pack, which he finished. .  The flareup was so bad that he was unable to sleep last night due to the pain.  He denies any recent injuries, falls or trauma.  Denies fevers.  He is a type II diabetic and his blood sugars normally run about 98, well-controlled.  Also has a history of chronic kidney disease, elevated troponin, heart failure and is oxygen dependent.      The history is provided by the patient and medical records.  Foot Pain    Past Medical History:  Diagnosis Date   Abnormal lung function test    a. Reported to possibly be COPD, but per pulm note: "Possible COPD - PFT was more suggestive of restrictive defect and diffusion defect likely from obesity and CHF"   CHF (congestive heart failure) (HCC)    Chronic diastolic heart failure (HCC) 05/07/2018   CKD (chronic kidney disease), stage III (HCC)    Diabetes mellitus type 2 in obese    Elevated troponin 05/07/2018   Hearing impaired    Hyperlipidemia    Hypertension    LBBB (left bundle branch block)    LVH (left ventricular hypertrophy)    Mild aortic stenosis    Mild pulmonary hypertension (HCC)    Morbid obesity (HCC)    Pulmonary hypertension (HCC)    Sleep apnea     Patient Active Problem List   Diagnosis Date Noted   Hypertensive heart disease with chronic diastolic congestive heart failure (HCC) 01/12/2023   Pulmonary hypertension (HCC) 01/12/2023   Asymptomatic HIV infection, with no history of HIV-related illness (HCC) 01/12/2023   HFrEF (heart failure with reduced ejection  fraction) (HCC) 10/16/2022   Chronic respiratory failure with hypoxia (HCC) 10/16/2022   Acute on chronic diastolic (congestive) heart failure (HCC) 08/21/2022   OSA (obstructive sleep apnea) 08/21/2022   Thyroid nodule 01/15/2020   PAD (peripheral artery disease) (HCC) 05/28/2019   DM2 (diabetes mellitus, type 2) (HCC) 09/26/2018   Obesity (BMI 30-39.9) 09/26/2018   Chronic diastolic heart failure (HCC) 05/07/2018   CKD (chronic kidney disease), stage III (HCC) 05/07/2018   Cardiomegaly    Encntr for general adult medical exam w/o abnormal findings 04/25/2018   Hearing loss 04/25/2018   LBBB (left bundle branch block) 04/25/2018   Hyperlipidemia 04/25/2018   Other long term (current) drug therapy 04/25/2018   Other specified disorders of pigmentation 04/25/2018   Personal history of noncompliance with medical treatment, presenting hazards to health 04/25/2018   Sensorineural hearing loss (SNHL) of left ear 06/14/2017   Essential hypertension 06/05/2013   Pure hypercholesterolemia 06/05/2013   Vitamin D deficiency 06/05/2013   Corns and callosity 11/03/2010    Past Surgical History:  Procedure Laterality Date   BACK SURGERY     RIGHT HEART CATH N/A 11/06/2022   Procedure: RIGHT HEART CATH;  Surgeon: Dorthula Nettles, DO;  [facility];  Service: Cardiovascular;  Laterality: N/A;   TRACHEOSTOMY  Home Medications    Prior to Admission medications   Medication Sig Start Date End Date Taking? Authorizing Provider  predniSONE (DELTASONE) 20 MG tablet Take 2 tablets (40 mg total) by mouth daily with breakfast for 5 days. 03/03/23 03/08/23 Yes Rinaldo Ratel, [place] N, FNP  atorvastatin (LIPITOR) 10 MG tablet Take 1 tablet (10 mg total) by mouth daily. 01/20/23 01/20/24  [name], FNP  bictegravir-emtricitabine-tenofovir AF (BIKTARVY) 50-200-25 MG TABS tablet TAKE 1 TABLET BY MOUTH DAILY. TRY TO TAKE AT THE SAME TIME EACH DAY WITH OR WITHOUT FOOD. 02/22/23   Danelle Earthly, MD  Blood Pressure Monitoring (BLOOD PRESSURE CUFF) MISC Use to check blood pressure x1 daily 01/15/23   O'Neal, Ronnald Ramp, MD  carvedilol (COREG) 12.5 MG tablet Take 1 tablet (12.5 mg total) by mouth 2 (two) times daily. 10/23/22 01/21/23  [name], FNP  JARDIANCE 10 MG TABS tablet TAKE 1 TABLET BY MOUTH EVERY DAY 01/08/23   Ronney Asters, NP  losartan (COZAAR) 100 MG tablet Take 1 tablet (100 mg total) by mouth daily. 10/23/22   [name], FNP  metFORMIN (GLUCOPHAGE) 1000 MG tablet TAKE 1 TABLET (1,000 MG TOTAL) BY MOUTH TWICE A DAY WITH FOOD 10/23/22   [name], FNP  torsemide (DEMADEX) 20 MG tablet Take 2 tablets (40 mg total) by mouth daily. 10/23/22   [name], FNP    Family History Family History  Problem Relation Age of Onset   Hypertension Mother    Hypertension Father     Social History Social History   Tobacco Use   Smoking status: Former    Years: 1    Types: Cigarettes    Quit date: 1999    Years since quitting: 25.5   Smokeless tobacco: Never   Tobacco comments:    1 pack would last one month--08/01/18  Vaping Use   Vaping Use: Never used  Substance Use Topics   Alcohol use: No   Drug use: No     Allergies   Patient has no known allergies.   Review of Systems Review of Systems  Constitutional:  Negative for fever.  Musculoskeletal:  Positive for joint swelling.     Physical Exam Triage Vital Signs ED Triage Vitals  Enc Vitals Group     BP 03/03/23 0817 128/82     Pulse Rate 03/03/23 0817 67     Resp 03/03/23 0817 18     Temp 03/03/23 0817 99.4 F (37.4 C)     Temp Source 03/03/23 0817 Oral     SpO2 03/03/23 0817 93 %     Weight --      Height --      Head Circumference --      Peak Flow --      Pain Score 03/03/23 0816 8     Pain Loc --      Pain Edu? --      Excl. in GC? --    No data found.  Updated Vital Signs BP 128/82 (BP Location: Left Arm)   Pulse 67   Temp 99.4 F (37.4 C) (Oral)   Resp 18    SpO2 93%   Visual Acuity Right Eye Distance:   Left Eye Distance:   Bilateral Distance:    Right Eye Near:   Left Eye Near:    Bilateral Near:     Physical Exam Vitals and nursing note reviewed.  Constitutional:      Appearance: Normal appearance.  HENT:  Head: Normocephalic and atraumatic.     Right Ear: External ear normal.     Left Ear: External ear normal.     Nose: Nose normal.     Mouth/Throat:     Mouth: Mucous membranes are moist.  Eyes:     Conjunctiva/sclera: Conjunctivae normal.  Cardiovascular:     Rate and Rhythm: Normal rate.     Pulses: Normal pulses.          Dorsalis pedis pulses are 2+ on the right side.       Posterior tibial pulses are 2+ on the right side.  Pulmonary:     Effort: Pulmonary effort is normal. No respiratory distress.  Musculoskeletal:        General: Swelling and tenderness present. No deformity or signs of injury.     Right hand: Swelling and tenderness present. No deformity or lacerations. Decreased range of motion. There is no disruption of two-point discrimination. Normal capillary refill. Normal pulse.       Feet:     Comments: Swelling, decreased range of motion and TTP of his left middle finger.  Feet:     Right foot:     Skin integrity: Erythema and warmth present.     Comments: Erythema, warmth and tenderness to the ball of his right foot.  No obvious skin breakdown or skin lesions. Skin:    General: Skin is warm and dry.     Capillary Refill: Capillary refill takes less than 2 seconds.     Findings: Erythema present.  Neurological:     General: No focal deficit present.     Mental Status: He is alert and oriented to person, place, and time.  Psychiatric:        Mood and Affect: Mood normal.        Behavior: Behavior is cooperative.      UC Treatments / Results  Labs (all labs ordered are listed, but only abnormal results are displayed) Labs Reviewed  URIC ACID    EKG   Radiology No results  found.  Procedures Procedures (including critical care time)  Medications Ordered in UC Medications  dexamethasone (DECADRON) injection 10 mg (has no administration in time range)    Initial Impression / Assessment and Plan / UC Course  I have reviewed the triage vital signs and the nursing notes.  Pertinent labs & imaging results that were available during my care of the patient were reviewed by me and considered in my medical decision making (see chart for details).  Vitals and triage reviewed, patient is hemodynamically stable.  Appearing to have a gout flare of his left middle finger, and right foot sole.  Will draw uric acid.  Recently treated with a Medrol Dosepak, do not think that this was strong enough.  Will do 40 mg of prednisone daily x 5 days as well as IM Decadron.  Educated on side effects of steroids.  Patient is unable to take NSAIDs due to elevated creatinine and kidney disease. POC, follow-up care and return precautions given, no questions at this time.     Final Clinical Impressions(s) / UC Diagnoses   Final diagnoses:  Acute gout of multiple sites, unspecified cause     Discharge Instructions      It appears you are having a gout flareup of multiple joints.  We have given you a steroid injection today in clinic, and you will take the prednisone tablets daily with breakfast over the next 5 days.  These will increase  your appetite and temporarily increase your blood sugar.  I have attached a low purine eating plan to help avoid triggering your gout in the future.  Please follow-up with your primary care provider regarding further management of your gout and to discuss preventatives.  Return to clinic for any new or concerning symptoms.      ED Prescriptions     Medication Sig Dispense Auth. Provider   predniSONE (DELTASONE) 20 MG tablet Take 2 tablets (40 mg total) by mouth daily with breakfast for 5 days. 10 tablet Nyrie Sigal, [place] N, FNP      PDMP not  reviewed this encounter.   Rinaldo Ratel [place] N, [place] 03/03/23 [phone]

## 2023-03-03 NOTE — Discharge Instructions (Signed)
It appears you are having a gout flareup of multiple joints.  We have given you a steroid injection today in clinic, and you will take the prednisone tablets daily with breakfast over the next 5 days.  These will increase your appetite and temporarily increase your blood sugar.  I have attached a low purine eating plan to help avoid triggering your gout in the future.  Please follow-up with your primary care provider regarding further management of your gout and to discuss preventatives.  Return to clinic for any new or concerning symptoms.

## 2023-03-09 NOTE — Progress Notes (Signed)
[facility] PRE[facility] Weekly Session  Patient Details  Name: Mark [name] MRN: [id number] Date of Birth: 01-19-1961 Age: 62 y.o. PCP: [name], FNP  Vitals:     [facility] Weekly seesion - 03/09/23 1000       [facility]PREP" Location   [facility] Location [facility]      Weekly Session   Topic Discussed Expectations and non-scale victories    Minutes exercised this week --   3 miles   Classes attended to date 13             [facility] 03/09/2023, 10:08 AM

## 2023-03-10 ENCOUNTER — Telehealth (HOSPITAL_COMMUNITY): Payer: Self-pay | Admitting: *Deleted

## 2023-03-16 NOTE — Progress Notes (Signed)
[facility] PRE[facility] Weekly Session  Patient Details  Name: Mark [name] MRN: [id number] Date of Birth: Dec 30, 1960 Age: 62 y.o. PCP: [name], FNP  Vitals:     [facility] Weekly seesion - 03/16/23 0900       [facility] Location   [facility]      Weekly Session   Topic Discussed Other   portions   Minutes exercised this week 180 minutes    Classes attended to date 15             Pam Jerral Bonito 03/16/2023, 9:53 AM

## 2023-03-22 ENCOUNTER — Ambulatory Visit: Payer: Self-pay

## 2023-03-22 NOTE — Patient Instructions (Signed)
Visit Information  Thank you for taking time to visit with me today. Please don't hesitate to contact me if I can be of assistance to you.   Following are the goals we discussed today:   Goals Addressed               This Visit's Progress     Patient Stated     To manage heart failure (pt-stated)        Care Coordination Interventions: Evaluation of current treatment plan related to heart failure  and patient's adherence to plan as established by provider Reviewed scheduled/upcoming provider appointment including: MR Card Morphology WO/W CM scheduled for 03/30/23 @3  PM      Other     I would like to lose weight and be more physically active        Care Coordination Interventions: Advised patient to discuss with primary care provider options regarding weight management Determined patient continues to participate in the PREP program with good results, he is losing weight and feeling stronger Determined patient is participating in a routine exercise regimen on his off days from PREP  Positive reinforcement given to patient for making efforts to improve his overall health       To better manage Gout        Care Coordination Interventions: Evaluation of current treatment plan related to gout and patient's adherence to plan as established by provider Educated patient regarding basic disease process related to gout Determined patient has completed 2 Urgent Care visits for treatment of gout Review of patient status, including review of consultant's reports, relevant laboratory and other test results, and medications completed Collaborated with PCP office to request assistance with scheduling patient for an in person visit for further evaluation/treatment of this condition, [place] CMA will contact patient for an appointment and patient is aware        Our next appointment is by telephone on 05/27/23 at 10:30 AM  Please call the care guide team at [phone] if you need to cancel  or reschedule your appointment.   If you are experiencing a Mental Health or Behavioral Health Crisis or need someone to talk to, please call 1-800-273-TALK (toll free, 24 hour hotline)  Patient verbalizes understanding of instructions and care plan provided today and agrees to view in MyChart. Active MyChart status and patient understanding of how to access instructions and care plan via MyChart confirmed with patient.     Delsa Sale, RN, BSN, CCM Care Management Coordinator [facility] Care Management  Direct Phone: [phone]

## 2023-03-22 NOTE — Patient Outreach (Signed)
  Care Coordination   Follow Up Visit Note   03/22/2023 Name: Mark [name] MRN: [id number] DOB: 08/21/1961  Mark [name] is a 62 y.o. year old male who sees [name], FNP for primary care. I spoke with  Mark [name] by phone today.  What matters to the patients health and wellness today?  Patient would like to have his toe rechecked for gout. [facility] and [facility].     Goals Addressed               This Visit's Progress     Patient Stated     To manage heart failure (pt-stated)        Care Coordination Interventions: Evaluation of current treatment plan related to heart failure  and patient's adherence to plan as established by provider Reviewed scheduled/upcoming provider appointment including: MR Card Morphology WO/W CM scheduled for 03/30/23 @3  PM      Other     I would like to lose weight and be more physically active        Care Coordination Interventions: Advised patient to discuss with primary care provider options regarding weight management Determined patient continues to participate in the PREP program with good results, he is losing weight and feeling stronger Determined patient is participating in a routine exercise regimen on his off days from [facility]  Positive reinforcement given to patient for making efforts to improve his overall health       To better manage Gout        Care Coordination Interventions: Evaluation of current treatment plan related to gout and patient's adherence to plan as established by provider Educated patient regarding basic disease process related to gout Determined patient has completed 2 Urgent Care visits for treatment of gout Review of patient status, including review of consultant's reports, relevant laboratory and other test results, and medications completed Collaborated with PCP office to request assistance with scheduling patient for an in person visit for further  evaluation/treatment of this condition, [place] CMA will contact patient for an appointment and patient is aware    Interventions Today    Flowsheet Row Most Recent Value  Chronic Disease   Chronic disease during today's visit Other  [Gout]  General Interventions   General Interventions Discussed/Reviewed General Interventions Discussed, General Interventions Reviewed, Doctor Visits, Labs, Communication with  Doctor Visits Discussed/Reviewed Doctor Visits Discussed, Doctor Visits Reviewed, Specialist  [Urgent Care]  Communication with RN  Benetta Spar CMA]  Exercise Interventions   Exercise Discussed/Reviewed Physical Activity, Exercise Reviewed, Exercise Discussed  Physical Activity Discussed/Reviewed Physical Activity Discussed, Physical Activity Reviewed, PREP  Education Interventions   Education Provided Provided Education  Provided Verbal Education On When to see the doctor, Medication, Nutrition, Exercise  Pharmacy Interventions   Pharmacy Dicussed/Reviewed Pharmacy Topics Discussed, Pharmacy Topics Reviewed, Medications and their functions          SDOH assessments and interventions completed:  No     Care Coordination Interventions:  Yes, provided   Follow up plan: Follow up call scheduled for 05/27/23 @10 :30 AM    Encounter Outcome:  Pt. Visit Completed

## 2023-03-24 ENCOUNTER — Encounter: Payer: Self-pay | Admitting: Family Medicine

## 2023-03-24 ENCOUNTER — Ambulatory Visit (INDEPENDENT_AMBULATORY_CARE_PROVIDER_SITE_OTHER): Payer: Medicare HMO | Admitting: Family Medicine

## 2023-03-24 VITALS — BP 124/82 | HR 70 | Temp 98.8°F | Ht 71.0 in | Wt 253.0 lb

## 2023-03-24 DIAGNOSIS — M1A40X0 Other secondary chronic gout, unspecified site, without tophus (tophi): Secondary | ICD-10-CM | POA: Diagnosis not present

## 2023-03-24 DIAGNOSIS — Z6835 Body mass index (BMI) 35.0-35.9, adult: Secondary | ICD-10-CM | POA: Diagnosis not present

## 2023-03-24 DIAGNOSIS — M1A20X0 Drug-induced chronic gout, unspecified site, without tophus (tophi): Secondary | ICD-10-CM

## 2023-03-24 MED ORDER — ALLOPURINOL 100 MG PO TABS
50.0000 mg | ORAL_TABLET | Freq: Every day | ORAL | 2 refills | Status: DC
Start: 2023-03-24 — End: 2023-08-12

## 2023-03-24 NOTE — Progress Notes (Signed)
I,Jameka J Llittleton, CMA,acting as a Neurosurgeon for [employer], NP.,have documented all relevant documentation on the behalf of Bradee Common, NP,as directed by  Dontrey Snellgrove Moshe Salisbury, NP while in the presence of Latrica Clowers, NP.  Subjective:  Patient ID: Mark [name] , male    DOB: [birth year] , 62 y.o.   MRN: [id number]  Chief Complaint  Patient presents with   Gout    HPI  Patient reports 3 weeks ago, he had pain under his right foot and the fingers on his right fingers were swollen. Patient states fingers and toes have been painful and swollen back to back and would like to get something to make it from happening often. Patient insisted that he feels that he has been having flare up of gout and he wants it to stop.     Past Medical History:  Diagnosis Date   Abnormal lung function test    a. Reported to possibly be COPD, but per pulm note: "Possible COPD - PFT was more suggestive of restrictive defect and diffusion defect likely from obesity and CHF"   CHF (congestive heart failure) (HCC)    Chronic diastolic heart failure (HCC) 05/07/2018   CKD (chronic kidney disease), stage III (HCC)    Diabetes mellitus type 2 in obese    Elevated troponin 05/07/2018   Hearing impaired    Hyperlipidemia    Hypertension    LBBB (left bundle branch block)    LVH (left ventricular hypertrophy)    Mild aortic stenosis    Mild pulmonary hypertension (HCC)    Morbid obesity (HCC)    Pulmonary hypertension (HCC)    Sleep apnea      Family History  Problem Relation Age of Onset   Hypertension Mother    Hypertension Father      Current Outpatient Medications:    allopurinol (ZYLOPRIM) 100 MG tablet, Take 0.5 tablets (50 mg total) by mouth daily. Take 50 mg by mouth every other day for gout prevention, Disp: 30 tablet, Rfl: 2   atorvastatin (LIPITOR) 10 MG tablet, Take 1 tablet (10 mg total) by mouth daily., Disp: 30 tablet, Rfl: 3   bictegravir-emtricitabine-tenofovir AF (BIKTARVY) 50-200-25 MG TABS  tablet, TAKE 1 TABLET BY MOUTH DAILY. TRY TO TAKE AT THE SAME TIME EACH DAY WITH OR WITHOUT FOOD., Disp: 30 tablet, Rfl: 5   Blood Pressure Monitoring (BLOOD PRESSURE CUFF) MISC, Use to check blood pressure x1 daily, Disp: 1 each, Rfl: 0   carvedilol (COREG) 12.5 MG tablet, Take 1 tablet (12.5 mg total) by mouth 2 (two) times daily., Disp: 180 tablet, Rfl: 0   JARDIANCE 10 MG TABS tablet, TAKE 1 TABLET BY MOUTH EVERY DAY, Disp: 30 tablet, Rfl: 3   losartan (COZAAR) 100 MG tablet, Take 1 tablet (100 mg total) by mouth daily., Disp: 90 tablet, Rfl: 1   metFORMIN (GLUCOPHAGE) 1000 MG tablet, TAKE 1 TABLET (1,000 MG TOTAL) BY MOUTH TWICE A DAY WITH FOOD, Disp: 180 tablet, Rfl: 1   torsemide (DEMADEX) 20 MG tablet, Take 2 tablets (40 mg total) by mouth daily., Disp: 180 tablet, Rfl: 1   No Known Allergies   Review of Systems  Constitutional: Negative.   Respiratory: Negative.    Genitourinary: Negative.   Musculoskeletal:  Positive for joint swelling and myalgias.  Psychiatric/Behavioral: Negative.       Today's Vitals   03/24/23 1036  BP: 124/82  Pulse: 70  Temp: 98.8 F (37.1 C)  SpO2: 91%  Weight: 253 lb (114.8 kg)  Height: 5\' 11"  (1.803 m)  PainSc: 5   PainLoc: Foot   Body mass index is 35.29 kg/m.  Wt Readings from Last 3 Encounters:  03/24/23 253 lb (114.8 kg)  02/22/23 250 lb (113.4 kg)  02/18/23 253 lb 8.5 oz (115 kg)     Objective:  Physical Exam Cardiovascular:     Rate and Rhythm: Normal rate.     Pulses: Normal pulses.  Skin:    General: Skin is warm and dry.  Neurological:     Mental Status: He is alert and oriented to person, place, and time.  Psychiatric:        Mood and Affect: Mood normal.         Assessment And Plan:  Chronic gout due to drug without tophus, unspecified site -     Allopurinol; Take 0.5 tablets (50 mg total) by mouth daily. Take 50 mg by mouth every other day for gout prevention  Dispense: 30 tablet; Refill: 2  Class 2 severe  obesity due to excess calories with serious comorbidity and body mass index (BMI) of 35.0 to 35.9 in adult [facility])     Return if symptoms worsen or fail to improve, for keep appt.  Patient was given opportunity to ask questions. Patient verbalized understanding of the plan and was able to repeat key elements of the plan. All questions were answered to their satisfaction.  Audi Conover Moshe Salisbury, NP  I, Amado Andal Moshe Salisbury, NP, have reviewed all documentation for this visit. The documentation on 03/31/23 for the exam, diagnosis, procedures, and orders are all accurate and complete.   IF YOU HAVE BEEN REFERRED TO A SPECIALIST, IT MAY TAKE 1-2 WEEKS TO SCHEDULE/PROCESS THE REFERRAL. IF YOU HAVE NOT HEARD FROM US/SPECIALIST IN TWO WEEKS, PLEASE GIVE [place] A CALL AT [phone] X [phone].   THE PATIENT IS ENCOURAGED TO PRACTICE SOCIAL DISTANCING DUE TO THE COVID-19 PANDEMIC.

## 2023-03-29 ENCOUNTER — Telehealth (HOSPITAL_COMMUNITY): Payer: Self-pay

## 2023-03-29 ENCOUNTER — Telehealth (HOSPITAL_COMMUNITY): Payer: Self-pay | Admitting: *Deleted

## 2023-03-29 NOTE — Telephone Encounter (Signed)
Patient called and left a message on triage vm, did not state what he wanted. Tried calling patient back. No answer, Left message to return call.

## 2023-03-29 NOTE — Telephone Encounter (Signed)
Patient called wanting you to know that he has to pay oop $300 for his MRI.

## 2023-03-29 NOTE — Telephone Encounter (Signed)
Reaching out to patient to offer assistance regarding upcoming cardiac imaging study; pt verbalizes understanding of appt date/time, parking situation and where to check in, and verified current allergies; name and call back number provided for further questions should they arise  Merle Prescott RN Navigator Cardiac Imaging [facility] [phone] office [phone] cell  Patient denies claustrophobia or metal. 

## 2023-03-30 ENCOUNTER — Other Ambulatory Visit (HOSPITAL_COMMUNITY): Payer: Self-pay | Admitting: Cardiology

## 2023-03-30 ENCOUNTER — Ambulatory Visit (HOSPITAL_COMMUNITY)
Admission: RE | Admit: 2023-03-30 | Discharge: 2023-03-30 | Disposition: A | Discharge status: Home / Self Care | Payer: Medicare HMO | Source: Ambulatory Visit | Attending: Cardiology | Admitting: Cardiology

## 2023-03-30 DIAGNOSIS — I5032 Chronic diastolic (congestive) heart failure: Secondary | ICD-10-CM

## 2023-03-30 MED ORDER — GADOBUTROL 1 MMOL/ML IV SOLN
10.0000 mL | Freq: Once | INTRAVENOUS | Status: AC | PRN
  Administered 2023-03-30: 10 mL via INTRAVENOUS

## 2023-03-31 DIAGNOSIS — M1A9XX0 Chronic gout, unspecified, without tophus (tophi): Secondary | ICD-10-CM | POA: Insufficient documentation

## 2023-03-31 NOTE — Assessment & Plan Note (Signed)
He is encouraged to strive for BMI less than 30 to decrease cardiac risk. Advised to aim for at least 150 minutes of exercise per week.  

## 2023-03-31 NOTE — Assessment & Plan Note (Signed)
Put on Allopurinol for prophylaxis

## 2023-03-31 NOTE — Telephone Encounter (Signed)
Patient had MRI done

## 2023-04-01 NOTE — Progress Notes (Signed)
[facility] PREP Weekly Session  Patient Details  Name: Mark [name] MRN: [id number] Date of Birth: 18-Nov-1960 Age: 62 y.o. PCP: [name], FNP  Vitals:     [facility] Weekly seesion - 04/01/23 1200       [facility] "PREP" Location   [facility] "PREP" Location [facility]      Weekly Session   Topic Discussed Calorie breakdown    Minutes exercised this week 180 minutes    Classes attended to date 50             Pam Jerral Bonito 04/01/2023, 12:10 PM

## 2023-04-03 ENCOUNTER — Other Ambulatory Visit: Payer: Self-pay | Admitting: Cardiology

## 2023-04-03 ENCOUNTER — Other Ambulatory Visit: Payer: Self-pay | Admitting: Nurse Practitioner

## 2023-04-03 DIAGNOSIS — I5032 Chronic diastolic (congestive) heart failure: Secondary | ICD-10-CM

## 2023-04-07 NOTE — Progress Notes (Signed)
[facility] PREP Weekly Session  Patient Details  Name: Mark [name] MRN: [id number] Date of Birth: February 27, 1961 Age: 62 y.o. PCP: [name], FNP  There were no vitals filed for this visit.   [facility] Weekly seesion - 04/07/23 0900       [facility] "PREP" Location   [facility]P" [facility]      Weekly Session   Topic Discussed Hitting roadblocks   goal setting   Minutes exercised this week 135 minutes    Classes attended to date 78             Pam Jerral Bonito 04/07/2023, 9:42 AM

## 2023-04-08 ENCOUNTER — Encounter (HOSPITAL_COMMUNITY): Payer: Self-pay

## 2023-04-08 ENCOUNTER — Ambulatory Visit (HOSPITAL_COMMUNITY)
Admission: EM | Admit: 2023-04-08 | Discharge: 2023-04-08 | Disposition: A | Discharge status: Home / Self Care | Payer: Medicare HMO | Attending: Family Medicine | Admitting: Family Medicine

## 2023-04-08 DIAGNOSIS — M10031 Idiopathic gout, right wrist: Secondary | ICD-10-CM

## 2023-04-08 MED ORDER — METHYLPREDNISOLONE ACETATE 80 MG/ML IJ SUSP
80.0000 mg | Freq: Once | INTRAMUSCULAR | Status: AC
  Administered 2023-04-08: 80 mg via INTRAMUSCULAR

## 2023-04-08 MED ORDER — PREDNISONE 20 MG PO TABS: 40.0000 mg | ORAL_TABLET | Freq: Every day | ORAL | 0 refills | Status: AC

## 2023-04-08 MED ORDER — METHYLPREDNISOLONE ACETATE 80 MG/ML IJ SUSP
INTRAMUSCULAR | Status: AC
  Filled 2023-04-08: qty 1

## 2023-04-08 NOTE — ED Triage Notes (Signed)
Patient having right wrist swelling. Was seen 2-3 weeks ago to be seen for his foot. Was given Allopurinol for gout. Thinks this is now aggravated in the wrist.  Onset of wrist swelling and pain a week in the right.

## 2023-04-08 NOTE — ED Provider Notes (Signed)
[facility]    CSN: [id number] Arrival date & time: 04/08/23  0805      History   Chief Complaint Chief Complaint  Patient presents with   Wrist Pain    HPI Mark [name] is a 62 y.o. male.    Wrist Pain  Here for right wrist pain, bothering him for about 1 week.  It has been swollen.  No trauma or fall.  He did have a gout attack in his foot about 2 or 3 weeks ago.  He has diabetes and his sugars have been good  He does have reduced kidney function with a creatinine of 1.98 at last blood draw  He is now taking allopurinol 100 mg  Past Medical History:  Diagnosis Date   Abnormal lung function test    a. Reported to possibly be COPD, but per pulm note: "Possible COPD - PFT was more suggestive of restrictive defect and diffusion defect likely from obesity and CHF"   CHF (congestive heart failure) (HCC)    Chronic diastolic heart failure (HCC) 05/07/2018   CKD (chronic kidney disease), stage III (HCC)    Diabetes mellitus type 2 in obese    Elevated troponin 05/07/2018   Hearing impaired    Hyperlipidemia    Hypertension    LBBB (left bundle branch block)    LVH (left ventricular hypertrophy)    Mild aortic stenosis    Mild pulmonary hypertension (HCC)    Morbid obesity (HCC)    Pulmonary hypertension (HCC)    Sleep apnea     Patient Active Problem List   Diagnosis Date Noted   Chronic gout without tophus 03/31/2023   Hypertensive heart disease with chronic diastolic congestive heart failure (HCC) 01/12/2023   Pulmonary hypertension (HCC) 01/12/2023   Asymptomatic HIV infection, with no history of HIV-related illness (HCC) 01/12/2023   HFrEF (heart failure with reduced ejection fraction) (HCC) 10/16/2022   Chronic respiratory failure with hypoxia (HCC) 10/16/2022   Acute on chronic diastolic (congestive) heart failure (HCC) 08/21/2022   OSA (obstructive sleep apnea) 08/21/2022   Thyroid nodule 01/15/2020   PAD (peripheral artery disease)  (HCC) 05/28/2019   DM2 (diabetes mellitus, type 2) (HCC) 09/26/2018   Obesity (BMI 30-39.9) 09/26/2018   Chronic diastolic heart failure (HCC) 05/07/2018   CKD (chronic kidney disease), stage III (HCC) 05/07/2018   Cardiomegaly    Encntr for general adult medical exam w/o abnormal findings 04/25/2018   Hearing loss 04/25/2018   LBBB (left bundle branch block) 04/25/2018   Hyperlipidemia 04/25/2018   Other long term (current) drug therapy 04/25/2018   Other specified disorders of pigmentation 04/25/2018   Personal history of noncompliance with medical treatment, presenting hazards to health 04/25/2018   Sensorineural hearing loss (SNHL) of left ear 06/14/2017   Essential hypertension 06/05/2013   Class 2 severe obesity due to excess calories with serious comorbidity and body mass index (BMI) of 35.0 to 35.9 in adult (HCC) 06/05/2013   Pure hypercholesterolemia 06/05/2013   Vitamin D deficiency 06/05/2013   Corns and callosity 11/03/2010    Past Surgical History:  Procedure Laterality Date   BACK SURGERY     RIGHT HEART CATH N/A 11/06/2022   Procedure: RIGHT HEART CATH;  Surgeon: Dorthula Nettles, DO;  [facility];  Service: Cardiovascular;  Laterality: N/A;   TRACHEOSTOMY         Home Medications    Prior to Admission medications   Medication Sig Start Date End Date Taking? Authorizing  Provider  allopurinol (ZYLOPRIM) 100 MG tablet Take 0.5 tablets (50 mg total) by mouth daily. Take 50 mg by mouth every other day for gout prevention 03/24/23 03/23/24 Yes Ellender Hose, NP  atorvastatin (LIPITOR) 10 MG tablet Take 1 tablet (10 mg total) by mouth daily. 01/20/23 01/20/24 Yes [name], FNP  bictegravir-emtricitabine-tenofovir AF (BIKTARVY) 50-200-25 MG TABS tablet TAKE 1 TABLET BY MOUTH DAILY. TRY TO TAKE AT THE SAME TIME EACH DAY WITH OR WITHOUT FOOD. 02/22/23  Yes Danelle Earthly, MD  carvedilol (COREG) 12.5 MG tablet TAKE 1 TABLET BY MOUTH 2 TIMES DAILY. 04/05/23   Yes [name], FNP  JARDIANCE 10 MG TABS tablet TAKE 1 TABLET BY MOUTH EVERY DAY 01/08/23  Yes Cleaver, Thomasene Ripple, NP  losartan (COZAAR) 100 MG tablet TAKE 1 TABLET BY MOUTH EVERY DAY 04/05/23  Yes Sabharwal, Aditya, DO  metFORMIN (GLUCOPHAGE) 1000 MG tablet TAKE 1 TABLET (1,000 MG TOTAL) BY MOUTH TWICE A DAY WITH FOOD 10/23/22  Yes [name], FNP  predniSONE (DELTASONE) 20 MG tablet Take 2 tablets (40 mg total) by mouth daily with breakfast for 5 days. 04/08/23 04/13/23 Yes Zenia Resides, MD  torsemide (DEMADEX) 20 MG tablet Take 2 tablets (40 mg total) by mouth daily. 10/23/22  Yes [name], FNP  Blood Pressure Monitoring (BLOOD PRESSURE CUFF) MISC Use to check blood pressure x1 daily 01/15/23   O'Neal, Ronnald Ramp, MD    Family History Family History  Problem Relation Age of Onset   Hypertension Mother    Hypertension Father     Social History Social History   Tobacco Use   Smoking status: Former    Current packs/day: 0.00    Types: Cigarettes    Start date: 1998    Quit date: 1999    Years since quitting: 25.6   Smokeless tobacco: Never   Tobacco comments:    1 pack would last one month--08/01/18  Vaping Use   Vaping status: Never Used  Substance Use Topics   Alcohol use: No   Drug use: No     Allergies   Patient has no known allergies.   Review of Systems Review of Systems   Physical Exam Triage Vital Signs ED Triage Vitals  Encounter Vitals Group     BP 04/08/23 0830 (!) 153/99     Systolic BP Percentile --      Diastolic BP Percentile --      Pulse Rate 04/08/23 0830 69     Resp 04/08/23 0830 18     Temp 04/08/23 0830 98.2 F (36.8 C)     Temp Source 04/08/23 0830 Oral     SpO2 04/08/23 0830 96 %     Weight 04/08/23 0829 247 lb (112 kg)     Height 04/08/23 0829 5\' 11"  (1.803 m)     Head Circumference --      Peak Flow --      Pain Score 04/08/23 0828 10     Pain Loc --      Pain Education --      Exclude from Growth Chart --    No data  found.  Updated Vital Signs BP (!) 153/99 (BP Location: Left Arm)   Pulse 69   Temp 98.2 F (36.8 C) (Oral)   Resp 18   Ht 5\' 11"  (1.803 m)   Wt 112 kg   SpO2 96%   BMI 34.45 kg/m   Visual Acuity Right Eye Distance:   Left Eye Distance:  Bilateral Distance:    Right Eye Near:   Left Eye Near:    Bilateral Near:     Physical Exam Vitals reviewed.  Constitutional:      General: He is not in acute distress.    Appearance: He is not ill-appearing, toxic-appearing or diaphoretic.  HENT:     Mouth/Throat:     Mouth: Mucous membranes are moist.  Eyes:     Extraocular Movements: Extraocular movements intact.     Conjunctiva/sclera: Conjunctivae normal.     Pupils: Pupils are equal, round, and reactive to light.  Musculoskeletal:     Cervical back: Neck supple.     Comments: There is swelling and tenderness and warmth of the right wrist extending onto the forearm.  No erythema.  No induration of the skin  Lymphadenopathy:     Cervical: No cervical adenopathy.  Skin:    Coloration: Skin is not pale.  Neurological:     Mental Status: He is alert and oriented to person, place, and time.  Psychiatric:        Behavior: Behavior normal.      UC Treatments / Results  Labs (all labs ordered are listed, but only abnormal results are displayed) Labs Reviewed - No data to display  EKG   Radiology No results found.  Procedures Procedures (including critical care time)  Medications Ordered in UC Medications  methylPREDNISolone acetate (DEPO-MEDROL) injection 80 mg (has no administration in time range)    Initial Impression / Assessment and Plan / UC Course  I have reviewed the triage vital signs and the nursing notes.  Pertinent labs & imaging results that were available during my care of the patient were reviewed by me and considered in my medical decision making (see chart for details).        Injection of Depomedrol is given here and prednisone burst sent  in for 5 days. Final Clinical Impressions(s) / UC Diagnoses   Final diagnoses:  Acute idiopathic gout of right wrist     Discharge Instructions      You have been given an injection of methylprednisolone 80 mg, steroid.  Take prednisone 20 mg--2 daily for 5 days  Continue your allopurinol as prescribed     ED Prescriptions     Medication Sig Dispense Auth. Provider   predniSONE (DELTASONE) 20 MG tablet Take 2 tablets (40 mg total) by mouth daily with breakfast for 5 days. 10 tablet Marlinda Mike Janace Aris, MD      PDMP not reviewed this encounter.   Zenia Resides, MD 04/08/23 [phone]

## 2023-04-08 NOTE — Discharge Instructions (Addendum)
You have been given an injection of methylprednisolone 80 mg, steroid.  Take prednisone 20 mg--2 daily for 5 days  Continue your allopurinol as prescribed

## 2023-04-19 NOTE — Progress Notes (Signed)
[facility] PREP Evaluation  Patient Details  Name: Mark [name] MRN: [id number] Date of Birth: 23-Oct-1960 Age: 62 y.o. PCP: [name], FNP  Vitals:   04/19/23 1430  BP: 128/82  Pulse: 80  SpO2: 94%  Weight: 246 lb (111.6 kg)     [facility] Eval - 04/19/23 1600       [facility] "PREP" Location   [facility]PREP" Location [facility]      Referral    Referring Provider Moore    Program Start Date 01/18/23    Program End Date 04/14/23      Measurement   Waist Circumference 53 inches    Waist Circumference End Program 53 inches    Hip Circumference 48 inches    Hip Circumference End Program 47 inches    Body fat 32.3 percent      Information for Trainer   Goals [facility]      Mobility and Daily Activities   I find it easy to walk up or down two or more flights of stairs. 2    I have no trouble taking out the trash. 4    I do housework such as vacuuming and dusting on my own without difficulty. 4    I can easily lift a gallon of milk (8lbs). 4    I can easily walk a mile. 3    I have no trouble reaching into high cupboards or reaching down to pick up something from the floor. 4    I do not have trouble doing out-door work such as Loss adjuster, chartered, raking leaves, or gardening. 4      Mobility and Daily Activities   I feel younger than my age. 2    I feel independent. 4    I feel energetic. 4    I live an active life.  3    I feel strong. 3    I feel healthy. 4    I feel active as other people my age. 4      How fit and strong are you.   Fit and Strong Total Score 49            Past Medical History:  Diagnosis Date   Abnormal lung function test    a. Reported to possibly be COPD, but per pulm note: "Possible COPD - PFT was more suggestive of restrictive defect and diffusion defect likely from obesity and CHF"   CHF (congestive heart failure) (HCC)    Chronic diastolic heart failure (HCC) 05/07/2018   CKD (chronic kidney disease), stage  III (HCC)    Diabetes mellitus type 2 in obese    Elevated troponin 05/07/2018   Hearing impaired    Hyperlipidemia    Hypertension    LBBB (left bundle branch block)    LVH (left ventricular hypertrophy)    Mild aortic stenosis    Mild pulmonary hypertension (HCC)    Morbid obesity (HCC)    Pulmonary hypertension (HCC)    Sleep apnea    Past Surgical History:  Procedure Laterality Date   BACK SURGERY     RIGHT HEART CATH N/A 11/06/2022   Procedure: RIGHT HEART CATH;  Surgeon: Dorthula Nettles, DO;  [facility];  Service: Cardiovascular;  Laterality: N/A;   TRACHEOSTOMY     Social History   Tobacco Use  Smoking Status Former   Current packs/day: 0.00   Types: Cigarettes   Start date: 1998  Quit date: 1999   Years since quitting: 25.6  Smokeless Tobacco Never  Tobacco Comments   1 pack would last one month--08/01/18   Attended 22 sessions, 12 educational Fit testing: 200 to 250 Sit to stands: 10 to 12 Bicep curls: 18 (right arm) to 22 (used left arm) Balance somewhat better. Plans to stay focused on exercise   [name] 04/19/2023, 4:12 PM

## 2023-04-24 ENCOUNTER — Other Ambulatory Visit: Payer: Self-pay | Admitting: Nurse Practitioner

## 2023-04-24 DIAGNOSIS — I5032 Chronic diastolic (congestive) heart failure: Secondary | ICD-10-CM

## 2023-05-01 ENCOUNTER — Other Ambulatory Visit: Payer: Self-pay | Admitting: General Practice

## 2023-05-06 ENCOUNTER — Ambulatory Visit (INDEPENDENT_AMBULATORY_CARE_PROVIDER_SITE_OTHER): Payer: Medicare HMO

## 2023-05-06 VITALS — BP 130/80 | HR 86 | Temp 98.4°F | Ht 68.0 in | Wt 251.6 lb

## 2023-05-06 DIAGNOSIS — Z23 Encounter for immunization: Secondary | ICD-10-CM

## 2023-05-06 DIAGNOSIS — Z0000 Encounter for general adult medical examination without abnormal findings: Secondary | ICD-10-CM | POA: Diagnosis not present

## 2023-05-06 NOTE — Progress Notes (Signed)
Subjective:   Mark [name] is a 62 y.o. male who presents for Medicare Annual/Subsequent preventive examination.  Visit Complete: In person    Review of Systems     Cardiac Risk Factors include: advanced age (>36men, >95 women);dyslipidemia;hypertension;male gender;obesity (BMI >30kg/m2)     Objective:    Today's Vitals   05/06/23 0954  BP: 130/80  Pulse: 86  Temp: 98.4 F (36.9 C)  TempSrc: Oral  SpO2: 92%  Weight: 251 lb 9.6 oz (114.1 kg)  Height: 5\' 8"  (1.727 m)   Body mass index is 38.26 kg/m.     05/06/2023   10:06 AM 12/31/2022   11:20 PM 11/06/2022    7:29 AM 09/12/2022    2:41 AM 08/21/2022    9:34 AM 04/08/2022   10:28 AM 03/12/2021   10:56 AM  Advanced Directives  Does Patient Have a Medical Advance Directive? No No No No No No No  Would patient like information on creating a medical advance directive?  No - Patient declined No - Patient declined  No - Patient declined No - Patient declined     Current Medications (verified) Outpatient Encounter Medications as of 05/06/2023  Medication Sig   allopurinol (ZYLOPRIM) 100 MG tablet Take 0.5 tablets (50 mg total) by mouth daily. Take 50 mg by mouth every other day for gout prevention   atorvastatin (LIPITOR) 10 MG tablet Take 1 tablet (10 mg total) by mouth daily.   bictegravir-emtricitabine-tenofovir AF (BIKTARVY) 50-200-25 MG TABS tablet TAKE 1 TABLET BY MOUTH DAILY. TRY TO TAKE AT THE SAME TIME EACH DAY WITH OR WITHOUT FOOD.   carvedilol (COREG) 12.5 MG tablet TAKE 1 TABLET BY MOUTH 2 TIMES DAILY.   JARDIANCE 10 MG TABS tablet TAKE 1 TABLET BY MOUTH EVERY DAY   losartan (COZAAR) 100 MG tablet TAKE 1 TABLET BY MOUTH EVERY DAY   metFORMIN (GLUCOPHAGE) 1000 MG tablet TAKE 1 TABLET (1,000 MG TOTAL) BY MOUTH TWICE A DAY WITH FOOD   torsemide (DEMADEX) 20 MG tablet TAKE 2 TABLETS (40 MG TOTAL) BY MOUTH DAILY.   Blood Pressure Monitoring (BLOOD PRESSURE CUFF) MISC Use to check blood pressure x1 daily (Patient not  taking: Reported on 05/06/2023)   No facility-administered encounter medications on file as of 05/06/2023.    Allergies (verified) Patient has no known allergies.   History: Past Medical History:  Diagnosis Date   Abnormal lung function test    a. Reported to possibly be COPD, but per pulm note: "Possible COPD - PFT was more suggestive of restrictive defect and diffusion defect likely from obesity and CHF"   CHF (congestive heart failure) (HCC)    Chronic diastolic heart failure (HCC) 05/07/2018   CKD (chronic kidney disease), stage III (HCC)    Diabetes mellitus type 2 in obese    Elevated troponin 05/07/2018   Hearing impaired    Hyperlipidemia    Hypertension    LBBB (left bundle branch block)    LVH (left ventricular hypertrophy)    Mild aortic stenosis    Mild pulmonary hypertension (HCC)    Morbid obesity (HCC)    Pulmonary hypertension (HCC)    Sleep apnea    Past Surgical History:  Procedure Laterality Date   BACK SURGERY     RIGHT HEART CATH N/A 11/06/2022   Procedure: RIGHT HEART CATH;  Surgeon: Dorthula Nettles, DO;  Location: [facility];  Service: Cardiovascular;  Laterality: N/A;   TRACHEOSTOMY     Family History  Problem Relation  Age of Onset   Hypertension Mother    Hypertension Father    Social History   Socioeconomic History   Marital status: Divorced    Spouse name: Not on file   Number of children: 2   Years of education: Not on file   Highest education level: Not on file  Occupational History   Occupation: self-employed    Comment: recieves disability  Tobacco Use   Smoking status: Former    Current packs/day: 0.00    Types: Cigarettes    Start date: 1998    Quit date: 1999    Years since quitting: 25.6   Smokeless tobacco: Never   Tobacco comments:    1 pack would last one month--08/01/18  Vaping Use   Vaping status: Never Used  Substance and Sexual Activity   Alcohol use: No   Drug use: No   Sexual activity: Not Currently     Partners: Female    Comment: declied condom  Other Topics Concern   Not on file  Social History Narrative   Not on file   Social Determinants of Health   Financial Resource Strain: Low Risk  (05/06/2023)   Overall Financial Resource Strain (CARDIA)    Difficulty of Paying Living Expenses: Not hard at all  Food Insecurity: No Food Insecurity (05/06/2023)   Hunger Vital Sign    Worried About Running Out of Food in the Last Year: Never true    Ran Out of Food in the Last Year: Never true  Transportation Needs: No Transportation Needs (05/06/2023)   PRAPARE - Administrator, Civil Service (Medical): No    Lack of Transportation (Non-Medical): No  Physical Activity: Sufficiently Active (05/06/2023)   Exercise Vital Sign    Days of Exercise per Week: 6 days    Minutes of Exercise per Session: 120 min  Stress: No Stress Concern Present (05/06/2023)   [employer] of Occupational Health - Occupational Stress Questionnaire    Feeling of Stress : Not at all  Social Connections: Moderately Isolated (05/06/2023)   Social Connection and Isolation Panel [NHANES]    Frequency of Communication with Friends and Family: More than three times a week    Frequency of Social Gatherings with Friends and Family: More than three times a week    Attends Religious Services: Never    Database administrator or Organizations: Yes    Attends Engineer, structural: More than 4 times per year    Marital Status: Divorced    Tobacco Counseling Counseling given: Not Answered Tobacco comments: 1 pack would last one month--08/01/18   Clinical Intake:  Pre-visit preparation completed: Yes  Pain : No/denies pain     Nutritional Status: BMI > 30  Obese Nutritional Risks: None Diabetes: Yes CBG done?: No Did pt. bring in CBG monitor from home?: No  How often do you need to have someone help you when you read instructions, pamphlets, or other written materials from your doctor or pharmacy?: 1  - Never  Interpreter Needed?: No  Information entered by :: NAllen LPN   Activities of Daily Living    05/06/2023    9:56 AM 08/21/2022    4:00 PM  In your present state of health, do you have any difficulty performing the following activities:  Hearing? 1 1  Comment has hearing aid   Vision? 0 0  Difficulty concentrating or making decisions? 0 0  Walking or climbing stairs? 0 0  Dressing or bathing? 0  0  Doing errands, shopping? 0 0  Preparing Food and eating ? N   Using the Toilet? N   In the past six months, have you accidently leaked urine? N   Do you have problems with loss of bowel control? N   Managing your Medications? N   Managing your Finances? N   Housekeeping or managing your Housekeeping? N     Patient Care Team: [name], FNP as PCP - General (General Practice) O'Neal, Ronnald Ramp, MD as PCP - Cardiology (Cardiology)  Indicate any recent Medical Services you may have received from other than [facility] providers in the past year (date may be approximate).     Assessment:   This is a routine wellness examination for [place].  Hearing/Vision screen Hearing Screening - Comments:: Has hearing aid that is maintained Vision Screening - Comments:: No regular eye exams   Goals Addressed             This Visit's Progress    Patient Stated       05/06/2023, wants to lose weight       Depression Screen    05/06/2023   10:09 AM 02/09/2023    9:11 AM 01/12/2023    9:32 AM 09/09/2022    9:57 AM 04/08/2022   10:30 AM 03/12/2021   10:56 AM 03/07/2020   11:19 AM  PHQ 2/9 Scores  PHQ - 2 Score 0 0 0 0 0 0 0  PHQ- 9 Score 0          Fall Risk    05/06/2023   10:08 AM 02/09/2023    9:11 AM 01/12/2023    9:32 AM 09/09/2022    9:57 AM 04/08/2022   10:30 AM  Fall Risk   Falls in the past year? 0 0 0 0 0  Number falls in past yr: 0 0 0 0 0  Injury with Fall? 0 0 0 0 0  Risk for fall due to : Medication side effect No Fall Risks No Fall Risks No Fall Risks Medication  side effect  Follow up Falls prevention discussed;Falls evaluation completed Falls evaluation completed Falls evaluation completed Falls evaluation completed Falls evaluation completed;Education provided;Falls prevention discussed    MEDICARE RISK AT HOME: Medicare Risk at Home Any stairs in or around the home?: No If so, are there any without handrails?: No Home free of loose throw rugs in walkways, pet beds, electrical cords, etc?: Yes Adequate lighting in your home to reduce risk of falls?: Yes Life alert?: No Use of a cane, walker or w/c?: No Grab bars in the bathroom?: No Shower chair or bench in shower?: No Elevated toilet seat or a handicapped toilet?: No  TIMED UP AND GO:  Was the test performed?  Yes  Length of time to ambulate 10 feet: 5 sec Gait steady and fast without use of assistive device    Cognitive Function:        05/06/2023   10:10 AM 04/08/2022   10:32 AM 03/12/2021   10:57 AM 03/07/2020   11:20 AM 12/27/2018    2:36 PM  6CIT Screen  What Year? 0 points 0 points 0 points 0 points 0 points  What month? 0 points 0 points 0 points 0 points 0 points  What time? 0 points 0 points 0 points 0 points 0 points  Count back from 20 0 points 0 points 2 points 0 points 0 points  Months in reverse 2 points 4 points 4 points 4 points 4  points  Repeat phrase 4 points 4 points 0 points 0 points 2 points  Total Score 6 points 8 points 6 points 4 points 6 points    Immunizations Immunization History  Administered Date(s) Administered   Hepb-cpg 09/28/2022, 02/09/2023   Influenza,inj,Quad PF,6+ Mos 06/18/2021, 04/13/2022   Influenza-Unspecified 04/13/2022   PFIZER(Purple Top)SARS-COV-2 Vaccination 02/01/2020, 02/22/2020, 08/20/2020   PNEUMOCOCCAL CONJUGATE-20 08/24/2022   Pfizer Covid-19 Vaccine Bivalent Booster 13yrs & up 06/18/2021, 04/13/2022    TDAP status: Due, Education has been provided regarding the importance of this vaccine. Advised may receive this vaccine at  local pharmacy or Health Dept. Aware to provide a copy of the vaccination record if obtained from local pharmacy or Health Dept. Verbalized acceptance and understanding.  Flu Vaccine status: Completed at today's visit  Pneumococcal vaccine status: Up to date  Covid-19 vaccine status: Information provided on how to obtain vaccines.   Qualifies for Shingles Vaccine? Yes   Zostavax completed No   Shingrix Completed?: No.    Education has been provided regarding the importance of this vaccine. Patient has been advised to call insurance company to determine out of pocket expense if they have not yet received this vaccine. Advised may also receive vaccine at local pharmacy or Health Dept. Verbalized acceptance and understanding.  Screening Tests Health Maintenance  Topic Date Due   OPHTHALMOLOGY EXAM  Never done   DTaP/Tdap/Td (1 - Tdap) Never done   Zoster Vaccines- Shingrix (1 of 2) Never done   Colonoscopy  Never done   FOOT EXAM  03/12/2022   INFLUENZA VACCINE  04/01/2023   Diabetic kidney evaluation - Urine ACR  04/09/2023   COVID-19 Vaccine (6 - 2023-24 season) 05/02/2023   HEMOGLOBIN A1C  07/15/2023   Diabetic kidney evaluation - eGFR measurement  01/11/2024   Medicare Annual Wellness (AWV)  05/05/2024   Hepatitis C Screening  Completed   HIV Screening  Completed   HPV VACCINES  Aged Out    Health Maintenance  Health Maintenance Due  Topic Date Due   OPHTHALMOLOGY EXAM  Never done   DTaP/Tdap/Td (1 - Tdap) Never done   Zoster Vaccines- Shingrix (1 of 2) Never done   Colonoscopy  Never done   FOOT EXAM  03/12/2022   INFLUENZA VACCINE  04/01/2023   Diabetic kidney evaluation - Urine ACR  04/09/2023   COVID-19 Vaccine (6 - 2023-24 season) 05/02/2023    Colorectal cancer screening: declines  Lung Cancer Screening: (Low Dose CT Chest recommended if Age 88-80 years, 20 pack-year currently smoking OR have quit w/in 15years.) does not qualify.   Lung Cancer Screening  Referral: no  Additional Screening:  Hepatitis C Screening: does qualify; Completed 06/20/2018  Vision Screening: Recommended annual ophthalmology exams for early detection of glaucoma and other disorders of the eye. Is the patient up to date with their annual eye exam?  No  Who is the provider or what is the name of the office in which the patient attends annual eye exams? none If pt is not established with a provider, would they like to be referred to a provider to establish care? No .   Dental Screening: Recommended annual dental exams for proper oral hygiene  Diabetic Foot Exam: Diabetic Foot Exam: Overdue, Pt has been advised about the importance in completing this exam. Pt is scheduled for diabetic foot exam on next appointment.  Community Resource Referral / Chronic Care Management: CRR required this visit?  No   CCM required this visit?  No  Plan:     I have personally reviewed and noted the following in the patient's chart:   Medical and social history Use of alcohol, tobacco or illicit drugs  Current medications and supplements including opioid prescriptions. Patient is not currently taking opioid prescriptions. Functional ability and status Nutritional status Physical activity Advanced directives List of other physicians Hospitalizations, surgeries, and ER visits in previous 12 months Vitals Screenings to include cognitive, depression, and falls Referrals and appointments  In addition, I have reviewed and discussed with patient certain preventive protocols, quality metrics, and best practice recommendations. A written personalized care plan for preventive services as well as general preventive health recommendations were provided to patient.     Barb Merino, LPN   09/05/1094   After Visit Summary: in person  Nurse Notes: none

## 2023-05-06 NOTE — Patient Instructions (Addendum)
[name] , Thank you for taking time to come for your Medicare Wellness Visit. I appreciate your ongoing commitment to your health goals. Please review the following plan we discussed and let me know if I can assist you in the future.   Referrals/Orders/Follow-Ups/Clinician Recommendations: none  This is a list of the screening recommended for you and due dates:  Health Maintenance  Topic Date Due   Eye exam for diabetics  Never done   DTaP/Tdap/Td vaccine (1 - Tdap) Never done   Zoster (Shingles) Vaccine (1 of 2) Never done   Colon Cancer Screening  Never done   Complete foot exam   03/12/2022   Yearly kidney health urinalysis for diabetes  04/09/2023   COVID-19 Vaccine (6 - 2023-24 season) 05/02/2023   Hemoglobin A1C  07/15/2023   Yearly kidney function blood test for diabetes  01/11/2024   Medicare Annual Wellness Visit  05/05/2024   Flu Shot  Completed   Hepatitis C Screening  Completed   HIV Screening  Completed   HPV Vaccine  Aged Out    Advanced directives: (Declined) Advance directive discussed with you today. Even though you declined this today, please call our office should you change your mind, and we can give you the proper paperwork for you to fill out.  Next Medicare Annual Wellness Visit scheduled for next year: No  Insert preventive care Attachment reference

## 2023-05-16 NOTE — Progress Notes (Signed)
ADVANCED HEART FAILURE CLINIC NOTE  Referring Physician: [name], FNP  Primary Care: [name], FNP Primary Cardiologist: Armanda Magic  HPI: Mark [name] is a 62 y.o. male w/ HFpEF, severe OSA, recently diagnosed HIV on Biktarvy (followed by Dr. Thedore Mins) admitted most recently in December 2023 for acute hypoxic respiratory failure believed to be secondary to acute on chronic HFpEF exacerbation. During that admission he had a TTE w/ LVEF 50-55% and severe asymmetric septal hypertrophy but no dynamic LVOT gradient. He was diuresed with IV lasix, started on GDMT and discharged home. Since that time he has been seen in [facility] clinic at where jardiance/losartan were continued and coreg was increased to 12.5mg  BID. He has also followed with pulmonology in the interim. Since discharge in 12/23 he has been on chronic 2L home O2 with some improvement in exercise capacity. He had PFTs in 2019 w/o any obstructive disease.   Interval hx:  From a functional standpoint Mark [name] has been doing very well.  He reports that his dyspnea and lower extremity edema have improved significantly.  He underwent sleep study and is awaiting delivery of a CPAP.  He also had a PYP scan and presents today to discuss those results.  Activity level/exercise tolerance: Improved, NYHA IIb Orthopnea:  Sleeps on 2-3 pillows Paroxysmal noctural dyspnea:  no Chest pain/pressure:  no Orthostatic lightheadedness:  no Palpitations:  no Lower extremity edema: No edema today, resolved. Presyncope/syncope:  no Cough:  no  Past Medical History:  Diagnosis Date   Abnormal lung function test    a. Reported to possibly be COPD, but per pulm note: "Possible COPD - PFT was more suggestive of restrictive defect and diffusion defect likely from obesity and CHF"   CHF (congestive heart failure) (HCC)    Chronic diastolic heart failure (HCC) 05/07/2018   CKD (chronic kidney disease), stage III (HCC)    Diabetes mellitus  type 2 in obese    Elevated troponin 05/07/2018   Hearing impaired    Hyperlipidemia    Hypertension    LBBB (left bundle branch block)    LVH (left ventricular hypertrophy)    Mild aortic stenosis    Mild pulmonary hypertension (HCC)    Morbid obesity (HCC)    Pulmonary hypertension (HCC)    Sleep apnea     Current Outpatient Medications  Medication Sig Dispense Refill   allopurinol (ZYLOPRIM) 100 MG tablet Take 0.5 tablets (50 mg total) by mouth daily. Take 50 mg by mouth every other day for gout prevention 30 tablet 2   atorvastatin (LIPITOR) 10 MG tablet Take 1 tablet (10 mg total) by mouth daily. 30 tablet 3   bictegravir-emtricitabine-tenofovir AF (BIKTARVY) 50-200-25 MG TABS tablet TAKE 1 TABLET BY MOUTH DAILY. TRY TO TAKE AT THE SAME TIME EACH DAY WITH OR WITHOUT FOOD. 30 tablet 5   Blood Pressure Monitoring (BLOOD PRESSURE CUFF) MISC Use to check blood pressure x1 daily (Patient not taking: Reported on 05/06/2023) 1 each 0   carvedilol (COREG) 12.5 MG tablet TAKE 1 TABLET BY MOUTH 2 TIMES DAILY. 180 tablet 0   JARDIANCE 10 MG TABS tablet TAKE 1 TABLET BY MOUTH EVERY DAY 30 tablet 3   losartan (COZAAR) 100 MG tablet TAKE 1 TABLET BY MOUTH EVERY DAY 90 tablet 1   metFORMIN (GLUCOPHAGE) 1000 MG tablet TAKE 1 TABLET (1,000 MG TOTAL) BY MOUTH TWICE A DAY WITH FOOD 180 tablet 1   torsemide (DEMADEX) 20 MG tablet TAKE 2 TABLETS (40 MG TOTAL)  BY MOUTH DAILY. 180 tablet 1   No current facility-administered medications for this visit.    No Known Allergies    Social History   Socioeconomic History   Marital status: Divorced    Spouse name: Not on file   Number of children: 2   Years of education: Not on file   Highest education level: Not on file  Occupational History   Occupation: self-employed    Comment: recieves disability  Tobacco Use   Smoking status: Former    Current packs/day: 0.00    Types: Cigarettes    Start date: 1998    Quit date: 1999    Years since  quitting: 25.7   Smokeless tobacco: Never   Tobacco comments:    1 pack would last one month--08/01/18  Vaping Use   Vaping status: Never Used  Substance and Sexual Activity   Alcohol use: No   Drug use: No   Sexual activity: Not Currently    Partners: Female    Comment: declied condom  Other Topics Concern   Not on file  Social History Narrative   Not on file   Social Determinants of Health   Financial Resource Strain: Low Risk  (05/06/2023)   Overall Financial Resource Strain (CARDIA)    Difficulty of Paying Living Expenses: Not hard at all  Food Insecurity: No Food Insecurity (05/06/2023)   Hunger Vital Sign    Worried About Running Out of Food in the Last Year: Never true    Ran Out of Food in the Last Year: Never true  Transportation Needs: No Transportation Needs (05/06/2023)   PRAPARE - Administrator, Civil Service (Medical): No    Lack of Transportation (Non-Medical): No  Physical Activity: Sufficiently Active (05/06/2023)   Exercise Vital Sign    Days of Exercise per Week: 6 days    Minutes of Exercise per Session: 120 min  Stress: No Stress Concern Present (05/06/2023)   [employer] of Occupational Health - Occupational Stress Questionnaire    Feeling of Stress : Not at all  Social Connections: Moderately Isolated (05/06/2023)   Social Connection and Isolation Panel [NHANES]    Frequency of Communication with Friends and Family: More than three times a week    Frequency of Social Gatherings with Friends and Family: More than three times a week    Attends Religious Services: Never    Database administrator or Organizations: Yes    Attends Engineer, structural: More than 4 times per year    Marital Status: Divorced  Intimate Partner Violence: Not At Risk (05/06/2023)   Humiliation, Afraid, Rape, and Kick questionnaire    Fear of Current or Ex-Partner: No    Emotionally Abused: No    Physically Abused: No    Sexually Abused: No      Family  History  Problem Relation Age of Onset   Hypertension Mother    Hypertension Father     PHYSICAL EXAM: There were no vitals filed for this visit.  GENERAL: Well nourished, well developed, and in no apparent distress at rest.  HEENT: Negative for arcus senilis or xanthelasma. There is no scleral icterus.  The mucous membranes are pink and moist.   NECK: Supple, No masses. Normal carotid upstrokes without bruits. No masses or thyromegaly.    CHEST: There are no chest wall deformities. There is no chest wall tenderness. Respirations are unlabored.  Lungs-CTA bilaterally CARDIAC:  JVP: 8 cm  Normal rate with regular rhythm. No murmurs, rubs or gallops.  Pulses are 2+ and symmetrical in upper and lower extremities.  No edema.  ABDOMEN: Soft, non-tender, non-distended. There are no masses or hepatomegaly. There are normal bowel sounds.  EXTREMITIES: Warm and well perfused with no cyanosis, clubbing.  LYMPHATIC: No axillary or supraclavicular lymphadenopathy.  NEUROLOGIC: Patient is oriented x3 with no focal or lateralizing neurologic deficits.  PSYCH: Patients affect is appropriate, there is no evidence of anxiety or depression.  SKIN: Warm and dry; no lesions or wounds.    DATA REVIEW  ECG: 08/25/22: NSR with wide LBBB, QRSd of  ECHO: LVEF 55%, severe asymmetric LVH.   PYP:  12/28/22: Grade II uptake  CMR: 03/30/23:  Study not suggestive of cardiac amyloidosis.  In chart review, study is more consistent with hypertensive heart disease; this is favored over low risk variant hypertrophic non obstructive cardiomyopathy.  Evidence of LBBB.  LVEF 47%.  Evidence of RV dilation and pulmonary hypertension.     Latest Ref Rng & Units 05/11/2018   10:13 AM  PFT Results  FVC-Pre L 2.46   FVC-Predicted Pre % 58   FVC-Post L 2.25   FVC-Predicted Post % 53   Pre FEV1/FVC % % 79   Post FEV1/FCV % % 81   FEV1-Pre L 1.94   FEV1-Predicted Pre % 59   FEV1-Post L 1.84    DLCO uncorrected ml/min/mmHg 19.38   DLCO UNC% % 57   DLCO corrected ml/min/mmHg 19.67   DLCO COR %Predicted % 58   DLVA Predicted % 105   TLC L 5.03   TLC % Predicted % 69   RV % Predicted % 99      ASSESSMENT & PLAN:  Heart failure with preserved EF Etiology of ZO:XWRUEA LVH on TTE; although, he has significant HTN, degree of LVH is concerning for infiltrative cardiomyopathy. CMR consistent with hypertensive heart disease. Myeloma panel previously negative; however, no FLC assay. W NYHA class / AHA Stage:IIB Volume status & Diuretics: Euvolemic on exam today Vasodilators:Losartan 100mg  daily Beta-Blocker:coreg 12.5mg  BID; plan to decrease as he has HFpEF MRA: repeat lab work pending; will hold off for the time being.  Cardiometabolic:Jardiance 10mg  Devices therapies & Valvulopathies:CMR pending; if he has HCM will further risk stratify.  Advanced therapies:Not indicated; poor insight.  He has severe OSA with a new O2 requirement. Plan for echo w/ bubble study to rule out shunting & RHC to evaluate for PH.   2. Evaluation for Cardiac amyloid - PYP scan is grade 1 - Negative myeloma panel (no M spike etc) -At this time I do not think his PYP scan is positive.  LVH is likely secondary to uncontrolled hypertension.  CMR not consistent with amyloid.   3. HTN - D/C losartan - losartan 100mg  daily  4. OSA - untreated, sleep study in 2015 showed severe OSA w/ AHI 41.5  - discussed association between untreated OSA and CV disease/ HTN - he will need repeat sleep study. Will arrange  - RHC to evaluate for PH.    5. Obesity  - Body mass index is 35.93 kg/m. - wt loss advised    6. CKD IIIb  - b/l SCr ~1.4 - noted bump in SCr to 1.95 in ED on 1/13 - repeat BMP today   6. HLD - LDL elevated, 110 mg/dL - on atorvastatin 20  - followed by cardiology    7. HIV - now followed by ID, Dr. Thedore Mins  - continue  Biktarvy    Jacquelynn Friend Advanced Heart Failure Mechanical  Circulatory Support

## 2023-05-17 ENCOUNTER — Ambulatory Visit (HOSPITAL_COMMUNITY)
Admission: RE | Admit: 2023-05-17 | Discharge: 2023-05-17 | Disposition: A | Discharge status: Home / Self Care | Payer: Medicare HMO | Source: Ambulatory Visit | Attending: Cardiology | Admitting: Cardiology

## 2023-05-17 ENCOUNTER — Encounter (HOSPITAL_COMMUNITY): Payer: Self-pay | Admitting: Cardiology

## 2023-05-17 VITALS — BP 130/80 | HR 53 | Wt 249.2 lb

## 2023-05-17 DIAGNOSIS — I10 Essential (primary) hypertension: Secondary | ICD-10-CM | POA: Diagnosis not present

## 2023-05-17 DIAGNOSIS — I5032 Chronic diastolic (congestive) heart failure: Secondary | ICD-10-CM | POA: Insufficient documentation

## 2023-05-17 DIAGNOSIS — E785 Hyperlipidemia, unspecified: Secondary | ICD-10-CM | POA: Diagnosis not present

## 2023-05-17 DIAGNOSIS — I130 Hypertensive heart and chronic kidney disease with heart failure and stage 1 through stage 4 chronic kidney disease, or unspecified chronic kidney disease: Secondary | ICD-10-CM | POA: Insufficient documentation

## 2023-05-17 DIAGNOSIS — Z21 Asymptomatic human immunodeficiency virus [HIV] infection status: Secondary | ICD-10-CM | POA: Diagnosis not present

## 2023-05-17 DIAGNOSIS — E669 Obesity, unspecified: Secondary | ICD-10-CM | POA: Insufficient documentation

## 2023-05-17 DIAGNOSIS — I447 Left bundle-branch block, unspecified: Secondary | ICD-10-CM | POA: Insufficient documentation

## 2023-05-17 DIAGNOSIS — N1832 Chronic kidney disease, stage 3b: Secondary | ICD-10-CM | POA: Insufficient documentation

## 2023-05-17 DIAGNOSIS — R9431 Abnormal electrocardiogram [ECG] [EKG]: Secondary | ICD-10-CM | POA: Insufficient documentation

## 2023-05-17 DIAGNOSIS — J9611 Chronic respiratory failure with hypoxia: Secondary | ICD-10-CM | POA: Insufficient documentation

## 2023-05-17 DIAGNOSIS — Z6835 Body mass index (BMI) 35.0-35.9, adult: Secondary | ICD-10-CM | POA: Diagnosis not present

## 2023-05-17 DIAGNOSIS — B20 Human immunodeficiency virus [HIV] disease: Secondary | ICD-10-CM | POA: Diagnosis not present

## 2023-05-17 DIAGNOSIS — I272 Pulmonary hypertension, unspecified: Secondary | ICD-10-CM | POA: Insufficient documentation

## 2023-05-17 DIAGNOSIS — E1122 Type 2 diabetes mellitus with diabetic chronic kidney disease: Secondary | ICD-10-CM | POA: Insufficient documentation

## 2023-05-17 DIAGNOSIS — G4733 Obstructive sleep apnea (adult) (pediatric): Secondary | ICD-10-CM | POA: Diagnosis not present

## 2023-05-17 LAB — BASIC METABOLIC PANEL
Anion gap: 6 (ref 5–15)
BUN: 22 mg/dL (ref 8–23)
CO2: 35 mmol/L — ABNORMAL HIGH (ref 22–32)
Calcium: 9.4 mg/dL (ref 8.9–10.3)
Chloride: 100 mmol/L (ref 98–111)
Creatinine, Ser: 1.88 mg/dL — ABNORMAL HIGH (ref 0.61–1.24)
GFR, Estimated: 39 mL/min — ABNORMAL LOW (ref >60)
Glucose, Bld: 94 mg/dL (ref 70–99)
Potassium: 4.5 mmol/L (ref 3.5–5.1)
Sodium: 141 mmol/L (ref 135–145)

## 2023-05-17 LAB — BRAIN NATRIURETIC PEPTIDE: B Natriuretic Peptide: 69.6 pg/mL (ref 0.0–100.0)

## 2023-05-17 MED ORDER — AMLODIPINE BESYLATE 5 MG PO TABS: 5.0000 mg | ORAL_TABLET | Freq: Every day | ORAL | 3 refills | Status: DC

## 2023-05-17 MED ORDER — SPIRONOLACTONE 25 MG PO TABS: 12.5000 mg | ORAL_TABLET | Freq: Every day | ORAL | 3 refills | Status: DC

## 2023-05-17 MED ORDER — CARVEDILOL 6.25 MG PO TABS
6.2500 mg | ORAL_TABLET | Freq: Two times a day (BID) | ORAL | 3 refills | Status: AC
Start: 2023-05-17 — End: ?

## 2023-05-17 NOTE — Patient Instructions (Signed)
Medication Changes:  DECREASE CARVEDILOL TO 6.25MG  TWICE DAILY   START: AMLODIPINE 5MG  DAILY   START: SPIRONOLACTONE 12.5MG  DAILY   Lab Work:  Labs done today, your results will be available in MyChart, we will contact you for abnormal readings.  Testing/Procedures:  VQ SCAN OF YOUR LUNGS- THEY WILL CALL YOU TO GET YOU SCHEDULED   Referrals:  YOU HAVE BEEN REFERRED TO PULMONOLOGY THEY WILL REACH OUT TO YOU OR CALL TO ARRANGE THIS. PLEASE CALL us WITH ANY CONCERNS   Follow-Up in: 1 MONTH WITH PHARMACY AS SCHEDULED AND THEN 2 MONTHS WITH DR. Gasper Lloyd AS SCHEDULED   At the [facility], you and your health needs are our priority. We have a designated team specialized in the treatment of Heart Failure. This Care Team includes your primary Heart Failure Specialized Cardiologist (physician), Advanced Practice Providers (APPs- Physician Assistants and Nurse Practitioners), and Pharmacist who all work together to provide you with the care you need, when you need it.   You may see any of the following providers on your designated Care Team at your next follow up:  Dr. Arvilla Meres Dr. Marca Ancona Dr. Marcos Eke, NP Robbie Lis, [place] [name] [place], [place] Brynda Peon, NP Karle Plumber, PharmD   Please be sure to bring in all your medications bottles to every appointment.   Need to Contact us:  If you have any questions or concerns before your next appointment please send [place] a message through [place] or call our office at [phone].    TO LEAVE A MESSAGE FOR THE NURSE SELECT OPTION 2, PLEASE LEAVE A MESSAGE INCLUDING: YOUR NAME DATE OF BIRTH CALL BACK NUMBER REASON FOR CALL**this is important as we prioritize the call backs  YOU WILL RECEIVE A CALL BACK THE SAME DAY AS LONG AS YOU CALL BEFORE 4:00 PM

## 2023-05-18 ENCOUNTER — Telehealth (HOSPITAL_COMMUNITY): Payer: Self-pay | Admitting: Licensed Clinical Social Worker

## 2023-05-18 LAB — ANA W/REFLEX: Anti Nuclear Antibody (ANA): NEGATIVE

## 2023-05-18 NOTE — Progress Notes (Signed)
Heart and Vascular Care Navigation  05/18/2023  WINFREY BIRKHEAD Nov 18, 1960 [id number]  Reason for Referral: Paramedicine enrollment   Engaged with patient by telephone for initial visit for Heart and Vascular Care Coordination.                                                                                                   Assessment:      Paramedicine Initial Assessment:  Housing:  In what kind of housing do you live? House/apt/trailer/shelter? apartment  Do you rent/pay a mortgage/own? rent  Do you live with anyone? no  Are you currently worried about losing your housing? no  Social:  Do you have any children? Son who lives in [place]  Do you have family or friends who live locally? Also has  a sister but she lives in [place]:  Are you currently insured? [employer] Medicare  Do you have prescription coverage? yes  Transportation:  Do you have transportation to your medical appointments? Yes- drives himself   Daily Health Needs:  How do you manage your medications at home? Takes them out of the bottle  Do you ever take your medications differently than prescribed? no  Do you have issues affording your medications? No  Do you have any concerns with mobility at home? Not assessed  Do you currently use tobacco products or have recently quit? no  Are there any additional barriers you see to getting the care you need? no                                    HRT/VAS Care Coordination     Patients Home Cardiology Office Heart Failure Clinic   Outpatient Care Team Community Paramedicine; Social Worker   Living arrangements for the past 2 months Apartment   Lives with: Self   Patient Current Insurance Coverage Managed Medicare   Patient Has Concern With Paying Medical Bills No   Does Patient Have Prescription Coverage? Yes   Home Assistive Devices/Equipment None   DME Agency [employer]   [name] Agency NA       Social History:                                                                              SDOH Screenings   Food Insecurity: No Food Insecurity (05/06/2023)  Housing: Low Risk  (05/06/2023)  Transportation Needs: No Transportation Needs (05/06/2023)  Utilities: Not At Risk (05/06/2023)  Alcohol Screen: Low Risk  (05/06/2023)  Depression (PHQ2-9): Low Risk  (05/06/2023)  Financial Resource Strain: Low Risk  (05/06/2023)  Physical Activity: Sufficiently Active (05/06/2023)  Social Connections: Moderately Isolated (05/06/2023)  Stress: No Stress Concern Present (05/06/2023)  Tobacco Use: Medium Risk (05/17/2023)  Health Literacy:  Adequate Health Literacy (05/06/2023)    SDOH Interventions: Financial Resources:    [employer] but did not want to share the amount  Food Insecurity:  None reported  Housing Insecurity:  None reported  Transportation:   Drives himself     Follow-up plan:    CSW sending referral out to paramedics for assignment and follow up  Will continue to follow through clinic and assist as needed  Burna Sis, LCSW Clinical Social Worker Advanced [facility] Failure Clinic Desk#: [phone] Cell#: [phone]

## 2023-05-19 ENCOUNTER — Telehealth (HOSPITAL_COMMUNITY): Payer: Self-pay | Admitting: Emergency Medicine

## 2023-05-19 NOTE — Telephone Encounter (Signed)
LVM reference contacting for initial home paramedicine visit. Will try and call back later today.    Beatrix Shipper, EMT-Paramedic [phone] 05/19/2023

## 2023-05-20 ENCOUNTER — Telehealth (HOSPITAL_COMMUNITY): Payer: Self-pay | Admitting: Emergency Medicine

## 2023-05-20 NOTE — Telephone Encounter (Signed)
Called to schedule initial visit.  Called forwarded to VM.  LVM with info to return call to set up appointment.  I will reach out to his son's number to follow up.    Beatrix Shipper, EMT-Paramedic [phone] 05/20/2023

## 2023-05-20 NOTE — Telephone Encounter (Signed)
Mr. Hladky returned my call and we scheduled initial home paramedicine visit 9/ @ 10:00    Beatrix Shipper, EMT-Paramedic [phone] 05/20/2023

## 2023-05-20 NOTE — Telephone Encounter (Signed)
Attempted to call Mr. Wilbanks's son and phone number would not go through. Unable to LVM  Beatrix Shipper, EMT-Paramedic [phone] 05/20/2023

## 2023-05-21 ENCOUNTER — Other Ambulatory Visit (HOSPITAL_COMMUNITY): Payer: Self-pay | Admitting: Emergency Medicine

## 2023-05-21 NOTE — Progress Notes (Signed)
SOCIAL/MEDICAL BARRIERS:  PHARMACY USED [name]   MED ISSUES:  AFFORDABILITY NONE  PT ASSIST APPS NEEDED NONE  PCP YES  Mark [name]   INSURANCE Medicare  SOURCE OF INCOME  SS  TRANSPORTATION YES  FOOD INSECURITIES/NEED NONE FOOD STAMPS YES  REVIEWED DIET/FLUID/SALT RESTRICTIONS YES  RENT/OWN HOME ISSUES RENT  SOCIAL SUPPORT NONE  SAFETY/DOMESTIC ISSUES NONE  SUBSTANCE ABUSE NONE  DAILY WEIGHTS start today  EDUCATE ON DISEASE PROCESS/SYMPTOMS/PURPOSES OF MEDS YES  Paramedicine Encounter    Patient ID: Mark [name], male    DOB: [birth year], 62 y.o.   MRN: [id number]   Complaints NONE  Assessment A&O x 4, skin W&D w/ good color  Compliance with meds YES w/ some discrepancies identified and corrected  Pill box filled N/A  Refills needed NONE  Meds changes since last visit decreased Carvedilol to 6.25, start Amlodopine 5mg , Spironolactone 12.5mg    Social changes NONE   Wt 247 lb (112 kg)   BMI 37.56 kg/m  Weight yesterday-not taken Last visit weight-249lb (clinic weight)   Initial home visit with Mark [name].  He lives in a very neatly kept apartment and lives alone. He is on home  O2.  He is currently taking his meds from the bottles.  I discussed with him switching over to a pill box and bring him one at our next visit.   In reviewing his medications I discovered he has been taking 20mg . Atorvastatin daily when he is in fact supposed to be taking 10mg  daily.  I discussed this with him and provided him with a pill cutter and demonstrated how to use same.  Also he tells me that he has been taking 25mg . Spironolactone daily but should be taking 12.5mg .  He says he's been taking a whole tablet instead of .5 tablet because he could not cut it in half before.  Now that he has a pill cutter this should remedy this issue.  We continued to discuss how taking medicines by the correct dose gives the most accurate measure of how the medicines are working  for him specifically.  Encouraged him to weigh daily and discussed why this is also important. Next home visit 05/28/23 @ 11:00.  ACTION: Home visit completed  Mark [name] [phone] 05/21/23  Patient Care Team: Mark Felts, FNP as PCP - General (General Practice) [name], Mark Ramp, MD as PCP - Cardiology (Cardiology)  Patient Active Problem List   Diagnosis Date Noted   Chronic gout without tophus 03/31/2023   Hypertensive heart disease with chronic diastolic congestive heart failure (HCC) 01/12/2023   Pulmonary hypertension (HCC) 01/12/2023   Asymptomatic HIV infection, with no history of HIV-related illness (HCC) 01/12/2023   HFrEF (heart failure with reduced ejection fraction) (HCC) 10/16/2022   Chronic respiratory failure with hypoxia (HCC) 10/16/2022   Acute on chronic diastolic (congestive) heart failure (HCC) 08/21/2022   OSA (obstructive sleep apnea) 08/21/2022   Thyroid nodule 01/15/2020   PAD (peripheral artery disease) (HCC) 05/28/2019   DM2 (diabetes mellitus, type 2) (HCC) 09/26/2018   Obesity (BMI 30-39.9) 09/26/2018   Chronic diastolic heart failure (HCC) 05/07/2018   CKD (chronic kidney disease), stage III (HCC) 05/07/2018   Cardiomegaly    Encntr for general adult medical exam w/o abnormal findings 04/25/2018   Hearing loss 04/25/2018   LBBB (left bundle branch block) 04/25/2018   Hyperlipidemia 04/25/2018   Other long term (current) drug therapy 04/25/2018   Other specified disorders of pigmentation 04/25/2018   Personal history of  noncompliance with medical treatment, presenting hazards to health 04/25/2018   Sensorineural hearing loss (SNHL) of left ear 06/14/2017   Essential hypertension 06/05/2013   Class 2 severe obesity due to excess calories with serious comorbidity and body mass index (BMI) of 35.0 to 35.9 in adult (HCC) 06/05/2013   Pure hypercholesterolemia 06/05/2013   Vitamin D deficiency 06/05/2013   Corns and callosity  11/03/2010    Current Outpatient Medications:    allopurinol (ZYLOPRIM) 100 MG tablet, Take 0.5 tablets (50 mg total) by mouth daily. Take 50 mg by mouth every other day for gout prevention, Disp: 30 tablet, Rfl: 2   amLODipine (NORVASC) 5 MG tablet, Take 1 tablet (5 mg total) by mouth daily., Disp: 90 tablet, Rfl: 3   atorvastatin (LIPITOR) 10 MG tablet, Take 1 tablet (10 mg total) by mouth daily., Disp: 30 tablet, Rfl: 3   bictegravir-emtricitabine-tenofovir AF (BIKTARVY) 50-200-25 MG TABS tablet, TAKE 1 TABLET BY MOUTH DAILY. TRY TO TAKE AT THE SAME TIME EACH DAY WITH OR WITHOUT FOOD., Disp: 30 tablet, Rfl: 5   carvedilol (COREG) 6.25 MG tablet, Take 1 tablet (6.25 mg total) by mouth 2 (two) times daily., Disp: 60 tablet, Rfl: 3   JARDIANCE 10 MG TABS tablet, TAKE 1 TABLET BY MOUTH EVERY DAY, Disp: 30 tablet, Rfl: 3   losartan (COZAAR) 100 MG tablet, TAKE 1 TABLET BY MOUTH EVERY DAY, Disp: 90 tablet, Rfl: 1   metFORMIN (GLUCOPHAGE) 1000 MG tablet, TAKE 1 TABLET (1,000 MG TOTAL) BY MOUTH TWICE A DAY WITH FOOD, Disp: 180 tablet, Rfl: 1   spironolactone (ALDACTONE) 25 MG tablet, Take 0.5 tablets (12.5 mg total) by mouth daily., Disp: 45 tablet, Rfl: 3   torsemide (DEMADEX) 20 MG tablet, TAKE 2 TABLETS (40 MG TOTAL) BY MOUTH DAILY., Disp: 180 tablet, Rfl: 1 No Known Allergies   Social History   Socioeconomic History   Marital status: Divorced    Spouse name: Not on file   Number of children: 2   Years of education: Not on file   Highest education level: Not on file  Occupational History   Occupation: self-employed    Comment: recieves disability  Tobacco Use   Smoking status: Former    Current packs/day: 0.00    Types: Cigarettes    Start date: 1998    Quit date: 1999    Years since quitting: 25.7   Smokeless tobacco: Never   Tobacco comments:    1 pack would last one month--08/01/18  Vaping Use   Vaping status: Never Used  Substance and Sexual Activity   Alcohol use: No    Drug use: No   Sexual activity: Not Currently    Partners: Female    Comment: declied condom  Other Topics Concern   Not on file  Social History Narrative   Not on file   Social Determinants of Health   Financial Resource Strain: Low Risk  (05/06/2023)   Overall Financial Resource Strain (CARDIA)    Difficulty of Paying Living Expenses: Not hard at all  Food Insecurity: No Food Insecurity (05/06/2023)   Hunger Vital Sign    Worried About Running Out of Food in the Last Year: Never true    Ran Out of Food in the Last Year: Never true  Transportation Needs: No Transportation Needs (05/06/2023)   PRAPARE - Administrator, Civil Service (Medical): No    Lack of Transportation (Non-Medical): No  Physical Activity: Sufficiently Active (05/06/2023)   Exercise Vital Sign  Days of Exercise per Week: 6 days    Minutes of Exercise per Session: 120 min  Stress: No Stress Concern Present (05/06/2023)   [employer] of Occupational Health - Occupational Stress Questionnaire    Feeling of Stress : Not at all  Social Connections: Moderately Isolated (05/06/2023)   Social Connection and Isolation Panel [NHANES]    Frequency of Communication with Friends and Family: More than three times a week    Frequency of Social Gatherings with Friends and Family: More than three times a week    Attends Religious Services: Never    Database administrator or Organizations: Yes    Attends Engineer, structural: More than 4 times per year    Marital Status: Divorced  Intimate Partner Violence: Not At Risk (05/06/2023)   Humiliation, Afraid, Rape, and Kick questionnaire    Fear of Current or Ex-Partner: No    Emotionally Abused: No    Physically Abused: No    Sexually Abused: No    Physical Exam      Future Appointments  Date Time Provider Department Center  05/25/2023  8:40 AM Mark Felts, FNP [facility] None  05/27/2023 10:30 AM Little, Karma Lew, RN [facility] None  06/16/2023 10:00 AM  [facility] None  07/15/2023 10:00 AM Dorthula Nettles, DO [facility] None  08/10/2023  8:45 AM Danelle Earthly, MD [facility]  05/10/2024 11:00 AM [facility]-ANNUAL WELLNESS VISIT [facility] None

## 2023-05-24 NOTE — Progress Notes (Unsigned)
[name], CMA,acting as a Neurosurgeon for Mark Felts, FNP.,have documented all relevant documentation on the behalf of Mark Felts, FNP,as directed by  Mark Felts, FNP while in the presence of Mark Felts, FNP.  Subjective:  Patient ID: Mark [name] , male    DOB: 1960/12/25 , 62 y.o.   MRN: [id number]  Chief Complaint  Patient presents with   Hypertension   Diabetes    HPI  Patient presents today for BP, CHOL, and DM. Patient reports compliance with medication. Patient denies any chest pain, SOB, or headaches. Patient has no concerns today. He is no longer working he is enjoying his time off and plans to retire early. 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 He goes every day of the week except Thursday. He was seen by a nurse at home on 05/21/2023 - his bp was up - he had not taken his torsemide. The cardiologist referred to pulmonology. He is not using a CPAP machine reports he can not afford it.   BP Readings from Last 3 Encounters: 05/25/23 : 120/60 05/21/23 : (!) 160/98 05/17/23 : 130/80       Past Medical History:  Diagnosis Date   Abnormal lung function test    a. Reported to possibly be COPD, but per pulm note: "Possible COPD - PFT was more suggestive of restrictive defect and diffusion defect likely from obesity and CHF"   CHF (congestive heart failure) (HCC)    Chronic diastolic heart failure (HCC) 05/07/2018   CKD (chronic kidney disease), stage III (HCC)    Diabetes mellitus type 2 in obese    Elevated troponin 05/07/2018   Hearing impaired    Hyperlipidemia    Hypertension    LBBB (left bundle branch block)    LVH (left ventricular hypertrophy)    Mild aortic stenosis    Mild pulmonary hypertension (HCC)    Morbid obesity (HCC)    Pulmonary hypertension (HCC)    Sleep apnea      Family History  Problem Relation Age of Onset   Hypertension Mother    Hypertension Father      Current Outpatient Medications:    allopurinol (ZYLOPRIM)  100 MG tablet, Take 0.5 tablets (50 mg total) by mouth daily. Take 50 mg by mouth every other day for gout prevention, Disp: 30 tablet, Rfl: 2   amLODipine (NORVASC) 5 MG tablet, Take 1 tablet (5 mg total) by mouth daily., Disp: 90 tablet, Rfl: 3   atorvastatin (LIPITOR) 10 MG tablet, Take 1 tablet (10 mg total) by mouth daily. (Patient not taking: Reported on 06/04/2023), Disp: 30 tablet, Rfl: 3   bictegravir-emtricitabine-tenofovir AF (BIKTARVY) 50-200-25 MG TABS tablet, TAKE 1 TABLET BY MOUTH DAILY. TRY TO TAKE AT THE SAME TIME EACH DAY WITH OR WITHOUT FOOD., Disp: 30 tablet, Rfl: 5   JARDIANCE 10 MG TABS tablet, TAKE 1 TABLET BY MOUTH EVERY DAY, Disp: 30 tablet, Rfl: 3   losartan (COZAAR) 100 MG tablet, TAKE 1 TABLET BY MOUTH EVERY DAY, Disp: 90 tablet, Rfl: 1   metFORMIN (GLUCOPHAGE) 1000 MG tablet, TAKE 1 TABLET (1,000 MG TOTAL) BY MOUTH TWICE A DAY WITH FOOD, Disp: 180 tablet, Rfl: 1   spironolactone (ALDACTONE) 25 MG tablet, Take 0.5 tablets (12.5 mg total) by mouth daily., Disp: 45 tablet, Rfl: 3   torsemide (DEMADEX) 20 MG tablet, TAKE 2 TABLETS (40 MG TOTAL) BY MOUTH DAILY., Disp: 180 tablet, Rfl: 1   atorvastatin (LIPITOR) 20 MG tablet,  TAKE 1 TABLET BY MOUTH EVERY DAY, Disp: 90 tablet, Rfl: 1   carvedilol (COREG) 6.25 MG tablet, Take 1 tablet (6.25 mg total) by mouth 2 (two) times daily., Disp: 180 tablet, Rfl: 1   No Known Allergies   Review of Systems  Constitutional: Negative.   HENT: Negative.    Eyes: Negative.   Respiratory: Negative.    Cardiovascular: Negative.   Gastrointestinal: Negative.   Neurological: Negative.   Psychiatric/Behavioral: Negative.       Today's Vitals   05/25/23 0828  BP: 120/60  Pulse: 70  Temp: 98.7 F (37.1 C)  TempSrc: Oral  Weight: 247 lb (112 kg)  Height: 5\' 8"  (1.727 m)  PainSc: 0-No pain   Body mass index is 37.56 kg/m.  Wt Readings from Last 3 Encounters:  06/04/23 243 lb (110.2 kg)  05/28/23 244 lb (110.7 kg)  05/25/23 247 lb  (112 kg)     Objective:  Physical Exam Vitals reviewed.  Constitutional:      General: He is not in acute distress.    Appearance: Normal appearance. He is obese.  Cardiovascular:     Rate and Rhythm: Normal rate and regular rhythm.     Pulses: Normal pulses.     Heart sounds: Normal heart sounds. No murmur heard. Pulmonary:     Effort: Pulmonary effort is normal. No respiratory distress.     Breath sounds: Normal breath sounds. No wheezing.  Skin:    General: Skin is warm and dry.     Capillary Refill: Capillary refill takes less than 2 seconds.  Neurological:     General: No focal deficit present.     Mental Status: He is alert and oriented to person, place, and time.     Cranial Nerves: No cranial nerve deficit.     Motor: No weakness.  Psychiatric:        Mood and Affect: Mood normal.        Behavior: Behavior normal.        Thought Content: Thought content normal.        Judgment: Judgment normal.         Assessment And Plan:  Type 2 diabetes mellitus with stage 3b chronic kidney disease, with long-term current use of insulin (HCC) Assessment & Plan: hgbA1c is stable. Continue current medications.   Orders: -     Hemoglobin A1c -     Microalbumin / creatinine urine ratio  Pulmonary hypertension (HCC) [I27.20] Assessment & Plan: Continue f/u with Cardiology.    OSA (obstructive sleep apnea) Assessment & Plan: He is to be wearing a CPAP but says he can not afford it. He has a referral to pulmonology to evaluate his oxygen treatment.    PAD (peripheral artery disease) (HCC) Assessment & Plan: Continue statin.    Mixed hyperlipidemia Assessment & Plan: Cholesterol levels are stable, continue statin. Tolerating well.   Orders: -     Lipid panel  COVID-19 vaccine administered Assessment & Plan: Covid 19 vaccine given in office observed for 15 minutes without any adverse reaction   Orders: Best boy Vaccine 19yrs &  older  Class 2 obesity due to excess calories with body mass index (BMI) of 37.0 to 37.9 in adult, unspecified whether serious comorbidity present Assessment & Plan: She is encouraged to strive for BMI less than 30 to decrease cardiac risk. Advised to aim for at least 150 minutes of exercise per week.    Hypertensive heart disease  without heart failure Assessment & Plan: Blood pressure is well controlled, continue current medications.      Return for controlled DM check 4 months.  Patient was given opportunity to ask questions. Patient verbalized understanding of the plan and was able to repeat key elements of the plan. All questions were answered to their satisfaction.   Jeanell Sparrow, FNP, have reviewed all documentation for this visit. The documentation on 05/25/23 for the exam, diagnosis, procedures, and orders are all accurate and complete.    IF YOU HAVE BEEN REFERRED TO A SPECIALIST, IT MAY TAKE 1-2 WEEKS TO SCHEDULE/PROCESS THE REFERRAL. IF YOU HAVE NOT HEARD FROM US/SPECIALIST IN TWO WEEKS, PLEASE GIVE [place] A CALL AT [phone].

## 2023-05-25 ENCOUNTER — Encounter: Payer: Self-pay | Admitting: Nurse Practitioner

## 2023-05-25 ENCOUNTER — Ambulatory Visit (INDEPENDENT_AMBULATORY_CARE_PROVIDER_SITE_OTHER): Payer: Medicare HMO | Admitting: Nurse Practitioner

## 2023-05-25 VITALS — BP 120/60 | HR 70 | Temp 98.7°F | Ht 68.0 in | Wt 247.0 lb

## 2023-05-25 DIAGNOSIS — N1832 Chronic kidney disease, stage 3b: Secondary | ICD-10-CM | POA: Diagnosis not present

## 2023-05-25 DIAGNOSIS — Z6837 Body mass index (BMI) 37.0-37.9, adult: Secondary | ICD-10-CM

## 2023-05-25 DIAGNOSIS — Z794 Long term (current) use of insulin: Secondary | ICD-10-CM

## 2023-05-25 DIAGNOSIS — Z23 Encounter for immunization: Secondary | ICD-10-CM

## 2023-05-25 DIAGNOSIS — E782 Mixed hyperlipidemia: Secondary | ICD-10-CM

## 2023-05-25 DIAGNOSIS — I1310 Hypertensive heart and chronic kidney disease without heart failure, with stage 1 through stage 4 chronic kidney disease, or unspecified chronic kidney disease: Secondary | ICD-10-CM

## 2023-05-25 DIAGNOSIS — E1122 Type 2 diabetes mellitus with diabetic chronic kidney disease: Secondary | ICD-10-CM | POA: Diagnosis not present

## 2023-05-25 DIAGNOSIS — I272 Pulmonary hypertension, unspecified: Secondary | ICD-10-CM | POA: Diagnosis not present

## 2023-05-25 DIAGNOSIS — E6609 Other obesity due to excess calories: Secondary | ICD-10-CM | POA: Insufficient documentation

## 2023-05-25 DIAGNOSIS — E1151 Type 2 diabetes mellitus with diabetic peripheral angiopathy without gangrene: Secondary | ICD-10-CM

## 2023-05-25 DIAGNOSIS — G4733 Obstructive sleep apnea (adult) (pediatric): Secondary | ICD-10-CM | POA: Diagnosis not present

## 2023-05-25 DIAGNOSIS — I739 Peripheral vascular disease, unspecified: Secondary | ICD-10-CM

## 2023-05-25 DIAGNOSIS — I110 Hypertensive heart disease with heart failure: Secondary | ICD-10-CM

## 2023-05-25 LAB — HEMOGLOBIN A1C
Est. average glucose Bld gHb Est-mCnc: 117 mg/dL
Hgb A1c MFr Bld: 5.7 % — ABNORMAL HIGH (ref 4.8–5.6)

## 2023-05-25 LAB — LIPID PANEL
Chol/HDL Ratio: 3.6 ratio (ref 0.0–5.0)
Cholesterol, Total: 185 mg/dL (ref 100–199)
HDL: 51 mg/dL (ref >39)
LDL Chol Calc (NIH): 112 mg/dL — ABNORMAL HIGH (ref 0–99)
Triglycerides: 123 mg/dL (ref 0–149)
VLDL Cholesterol Cal: 22 mg/dL (ref 5–40)

## 2023-05-26 DIAGNOSIS — N1832 Chronic kidney disease, stage 3b: Secondary | ICD-10-CM | POA: Diagnosis not present

## 2023-05-26 DIAGNOSIS — Z794 Long term (current) use of insulin: Secondary | ICD-10-CM | POA: Diagnosis not present

## 2023-05-26 DIAGNOSIS — E1122 Type 2 diabetes mellitus with diabetic chronic kidney disease: Secondary | ICD-10-CM | POA: Diagnosis not present

## 2023-05-27 ENCOUNTER — Ambulatory Visit: Payer: Self-pay

## 2023-05-27 LAB — MICROALBUMIN / CREATININE URINE RATIO
Creatinine, Urine: 47.7 mg/dL
Microalb/Creat Ratio: 71 mg/g creat — ABNORMAL HIGH (ref 0–29)
Microalbumin, Urine: 34.1 ug/mL

## 2023-05-27 NOTE — Patient Instructions (Signed)
Visit Information  Thank you for taking time to visit with me today. Please don't hesitate to contact me if I can be of assistance to you.   Following are the goals we discussed today:   Goals Addressed             This Visit's Progress    To work on lowering Cholesterol       Care Coordination Interventions: Provider established cholesterol goals reviewed Counseled on importance of regular laboratory monitoring as prescribed Provided LDL educational materials Reviewed role and benefits of statin for ASCVD risk reduction Reviewed importance of limiting foods high in cholesterol Reviewed exercise goals and target of 150 minutes per week Lipid Panel     Component Value Date/Time   CHOL 185 05/25/2023 0935   TRIG 123 05/25/2023 0935   HDL 51 05/25/2023 0935   CHOLHDL 3.6 05/25/2023 0935   CHOLHDL 4.0 09/09/2022 1110   VLDL 8 05/08/2018 0520   LDLCALC 112 (H) 05/25/2023 0935   LDLCALC 110 (H) 09/09/2022 1110   LABVLDL 22 05/25/2023 0935              Our next appointment is by telephone on 07/20/23 at 10:30 AM  Please call the care guide team at [phone] if you need to cancel or reschedule your appointment.   If you are experiencing a Mental Health or Behavioral Health Crisis or need someone to talk to, please call 1-800-273-TALK (toll free, 24 hour hotline)  The patient verbalized understanding of instructions, educational materials, and care plan provided today and DECLINED offer to receive copy of patient instructions, educational materials, and care plan.   Delsa Sale RN BSN CCM   [facility], [facility] Nurse Care Coordinator  Direct Dial: [phone] Website: [email or web address]

## 2023-05-27 NOTE — Patient Outreach (Signed)
Care Coordination   Follow Up Visit Note   05/27/2023 Name: Mark [name] MRN: [id number] DOB: 16-Nov-1960  Mark [name] is a 62 y.o. year old male who sees [name], FNP for primary care. I spoke with  Mark [name] by phone today.  What matters to the patients health and wellness today?  Patient would like to work on lowering his Cholesterol.     Goals Addressed             This Visit's Progress    To work on lowering Cholesterol       Care Coordination Interventions: Provider established cholesterol goals reviewed Counseled on importance of regular laboratory monitoring as prescribed Provided LDL educational materials Reviewed role and benefits of statin for ASCVD risk reduction Reviewed importance of limiting foods high in cholesterol Reviewed exercise goals and target of 150 minutes per week Lipid Panel     Component Value Date/Time   CHOL 185 05/25/2023 0935   TRIG 123 05/25/2023 0935   HDL 51 05/25/2023 0935   CHOLHDL 3.6 05/25/2023 0935   CHOLHDL 4.0 09/09/2022 1110   VLDL 8 05/08/2018 0520   LDLCALC 112 (H) 05/25/2023 0935   LDLCALC 110 (H) 09/09/2022 1110   LABVLDL 22 05/25/2023 0935       Interventions Today    Flowsheet Row Most Recent Value  Chronic Disease   Chronic disease during today's visit Diabetes, Hypertension (HTN), Other  [Hyperlipidemia]  General Interventions   General Interventions Discussed/Reviewed General Interventions Discussed, General Interventions Reviewed, Doctor Visits, Labs, Durable Medical Equipment (DME)  Doctor Visits Discussed/Reviewed Doctor Visits Discussed, Doctor Visits Reviewed, PCP, Specialist  Durable Medical Equipment (DME) Oxygen  Exercise Interventions   Exercise Discussed/Reviewed Physical Activity, Exercise Reviewed, Exercise Discussed  Physical Activity Discussed/Reviewed Physical Activity Discussed, Physical Activity Reviewed, PREP  Education Interventions   Education Provided Provided  Education, Provided Printed Education  Provided Verbal Education On Nutrition, Labs, Medication, Exercise, When to see the doctor  Labs Reviewed Hgb A1c, Lipid Profile  Nutrition Interventions   Nutrition Discussed/Reviewed Nutrition Discussed, Nutrition Reviewed, Portion sizes, Decreasing sugar intake, Decreasing fats  Pharmacy Interventions   Pharmacy Dicussed/Reviewed Pharmacy Topics Discussed, Pharmacy Topics Reviewed, Medications and their functions         SDOH assessments and interventions completed:  No     Care Coordination Interventions:  Yes, provided   Follow up plan: Follow up call scheduled for 07/20/23 @10 :30 AM    Encounter Outcome:  Patient Visit Completed

## 2023-05-28 ENCOUNTER — Other Ambulatory Visit (HOSPITAL_COMMUNITY): Payer: Self-pay | Admitting: Emergency Medicine

## 2023-05-28 NOTE — Progress Notes (Unsigned)
Paramedicine Encounter    Patient ID: Mark [name], male    DOB: 1961-07-29, 62 y.o.   MRN: [id number]   Complaints NONE  Assessmen A&O x 4, skin W&D w/ good  color.  Lung sounds clear and equal bilat.  No peripheral edema noted.  Compliance with meds YES  Pill box filled n/a  Refills needed NONE  Meds changes since last visit NONE    Social changes NONE   BP 118/70 (BP Location: Left Arm, Patient Position: Sitting, Cuff Size: Normal)   Pulse 67   Resp 16   Wt 244 lb (110.7 kg)   SpO2 95%   BMI 37.10 kg/m  Weight yesterday-245lb Last visit weight-247lb  Home visit with Mark [name] finds him doing well today.  He was cheerful and had no complaints of chest pain or SOB.  Lung sounds clear and equal throughout.  Pt had taken his BP meds today prior to my arrival and his BP was much improved to 118/70 from last weeks reading of 160/98.   I brought a pill box to place his meds in but he wasn't keen on that idea.  He has a box that he keeps his meds in and he was able to review with me each med- what it is, what it was for, when he takes it and how much to take.  He appears to be very well versed in this part of his care and I agreed to work with him without reconciling meds to a box. Lung sound good, with no noted peripheral edema.  ACTION: Home visit completed  [name] [phone] 05/28/23  Patient Care Team: [name], FNP as PCP - General (General Practice) O'Neal, Ronnald Ramp, MD as PCP - Cardiology (Cardiology)  Patient Active Problem List   Diagnosis Date Noted   COVID-19 vaccine administered 05/25/2023   Class 2 obesity due to excess calories with body mass index (BMI) of 37.0 to 37.9 in adult 05/25/2023   Chronic gout without tophus 03/31/2023   Hypertensive heart disease with chronic diastolic congestive heart failure (HCC) 01/12/2023   Pulmonary hypertension (HCC) 01/12/2023   Asymptomatic HIV infection, with no history of  HIV-related illness (HCC) 01/12/2023   HFrEF (heart failure with reduced ejection fraction) (HCC) 10/16/2022   Chronic respiratory failure with hypoxia (HCC) 10/16/2022   Acute on chronic diastolic (congestive) heart failure (HCC) 08/21/2022   OSA (obstructive sleep apnea) 08/21/2022   Thyroid nodule 01/15/2020   PAD (peripheral artery disease) (HCC) 05/28/2019   Type 2 diabetes mellitus with chronic kidney disease (HCC) 09/26/2018   Obesity (BMI 30-39.9) 09/26/2018   Chronic diastolic heart failure (HCC) 05/07/2018   CKD (chronic kidney disease), stage III (HCC) 05/07/2018   Cardiomegaly    Encntr for general adult medical exam w/o abnormal findings 04/25/2018   Hearing loss 04/25/2018   LBBB (left bundle branch block) 04/25/2018   Hyperlipidemia 04/25/2018   Other long term (current) drug therapy 04/25/2018   Other specified disorders of pigmentation 04/25/2018   Personal history of noncompliance with medical treatment, presenting hazards to health 04/25/2018   Sensorineural hearing loss (SNHL) of left ear 06/14/2017   Essential hypertension 06/05/2013   Class 2 severe obesity due to excess calories with serious comorbidity and body mass index (BMI) of 35.0 to 35.9 in adult (HCC) 06/05/2013   Pure hypercholesterolemia 06/05/2013   Vitamin D deficiency 06/05/2013   Corns and callosity 11/03/2010    Current Outpatient Medications:    allopurinol (ZYLOPRIM) 100 MG  tablet, Take 0.5 tablets (50 mg total) by mouth daily. Take 50 mg by mouth every other day for gout prevention, Disp: 30 tablet, Rfl: 2   amLODipine (NORVASC) 5 MG tablet, Take 1 tablet (5 mg total) by mouth daily., Disp: 90 tablet, Rfl: 3   atorvastatin (LIPITOR) 10 MG tablet, Take 1 tablet (10 mg total) by mouth daily., Disp: 30 tablet, Rfl: 3   bictegravir-emtricitabine-tenofovir AF (BIKTARVY) 50-200-25 MG TABS tablet, TAKE 1 TABLET BY MOUTH DAILY. TRY TO TAKE AT THE SAME TIME EACH DAY WITH OR WITHOUT FOOD., Disp: 30  tablet, Rfl: 5   carvedilol (COREG) 6.25 MG tablet, Take 1 tablet (6.25 mg total) by mouth 2 (two) times daily., Disp: 60 tablet, Rfl: 3   JARDIANCE 10 MG TABS tablet, TAKE 1 TABLET BY MOUTH EVERY DAY, Disp: 30 tablet, Rfl: 3   losartan (COZAAR) 100 MG tablet, TAKE 1 TABLET BY MOUTH EVERY DAY, Disp: 90 tablet, Rfl: 1   metFORMIN (GLUCOPHAGE) 1000 MG tablet, TAKE 1 TABLET (1,000 MG TOTAL) BY MOUTH TWICE A DAY WITH FOOD, Disp: 180 tablet, Rfl: 1   torsemide (DEMADEX) 20 MG tablet, TAKE 2 TABLETS (40 MG TOTAL) BY MOUTH DAILY., Disp: 180 tablet, Rfl: 1   spironolactone (ALDACTONE) 25 MG tablet, Take 0.5 tablets (12.5 mg total) by mouth daily. (Patient not taking: Reported on 05/28/2023), Disp: 45 tablet, Rfl: 3 No Known Allergies   Social History   Socioeconomic History   Marital status: Divorced    Spouse name: Not on file   Number of children: 2   Years of education: Not on file   Highest education level: Not on file  Occupational History   Occupation: self-employed    Comment: recieves disability  Tobacco Use   Smoking status: Former    Current packs/day: 0.00    Types: Cigarettes    Start date: 1998    Quit date: 1999    Years since quitting: 25.7   Smokeless tobacco: Never   Tobacco comments:    1 pack would last one month--08/01/18  Vaping Use   Vaping status: Never Used  Substance and Sexual Activity   Alcohol use: No   Drug use: No   Sexual activity: Not Currently    Partners: Female    Comment: declied condom  Other Topics Concern   Not on file  Social History Narrative   Not on file   Social Determinants of Health   Financial Resource Strain: Low Risk  (05/06/2023)   Overall Financial Resource Strain (CARDIA)    Difficulty of Paying Living Expenses: Not hard at all  Food Insecurity: No Food Insecurity (05/06/2023)   Hunger Vital Sign    Worried About Running Out of Food in the Last Year: Never true    Ran Out of Food in the Last Year: Never true  Transportation  Needs: No Transportation Needs (05/06/2023)   PRAPARE - Administrator, Civil Service (Medical): No    Lack of Transportation (Non-Medical): No  Physical Activity: Sufficiently Active (05/06/2023)   Exercise Vital Sign    Days of Exercise per Week: 6 days    Minutes of Exercise per Session: 120 min  Stress: No Stress Concern Present (05/06/2023)   [employer] of Occupational Health - Occupational Stress Questionnaire    Feeling of Stress : Not at all  Social Connections: Moderately Isolated (05/06/2023)   Social Connection and Isolation Panel [NHANES]    Frequency of Communication with Friends and Family: More than three  times a week    Frequency of Social Gatherings with Friends and Family: More than three times a week    Attends Religious Services: Never    Database administrator or Organizations: Yes    Attends Engineer, structural: More than 4 times per year    Marital Status: Divorced  Intimate Partner Violence: Not At Risk (05/06/2023)   Humiliation, Afraid, Rape, and Kick questionnaire    Fear of Current or Ex-Partner: No    Emotionally Abused: No    Physically Abused: No    Sexually Abused: No    Physical Exam      Future Appointments  Date Time Provider Department Center  06/16/2023 10:00 AM [facility] None  07/15/2023 10:00 AM Dorthula Nettles, DO [facility] None  07/20/2023 10:30 AM Little, Karma Lew, RN [facility] None  08/10/2023  8:45 AM Danelle Earthly, MD [facility]  09/27/2023 10:40 AM [name], FNP [facility] None  05/10/2024 11:00 AM [facility]ANNUAL WELLNESS VISIT [facility] None

## 2023-06-01 ENCOUNTER — Other Ambulatory Visit: Payer: Self-pay | Admitting: Nurse Practitioner

## 2023-06-01 ENCOUNTER — Other Ambulatory Visit (HOSPITAL_COMMUNITY): Payer: Self-pay | Admitting: Cardiology

## 2023-06-01 DIAGNOSIS — I5032 Chronic diastolic (congestive) heart failure: Secondary | ICD-10-CM

## 2023-06-02 ENCOUNTER — Other Ambulatory Visit: Payer: Self-pay

## 2023-06-02 DIAGNOSIS — I5032 Chronic diastolic (congestive) heart failure: Secondary | ICD-10-CM

## 2023-06-02 MED ORDER — CARVEDILOL 6.25 MG PO TABS
6.2500 mg | ORAL_TABLET | Freq: Two times a day (BID) | ORAL | 3 refills | Status: DC
Start: 2023-06-02 — End: 2023-06-03

## 2023-06-03 ENCOUNTER — Other Ambulatory Visit (HOSPITAL_COMMUNITY): Payer: Self-pay | Admitting: Pharmacist

## 2023-06-03 DIAGNOSIS — I5032 Chronic diastolic (congestive) heart failure: Secondary | ICD-10-CM

## 2023-06-03 MED ORDER — CARVEDILOL 6.25 MG PO TABS
6.2500 mg | ORAL_TABLET | Freq: Two times a day (BID) | ORAL | 1 refills | Status: DC
Start: 2023-06-03 — End: 2023-07-15

## 2023-06-04 ENCOUNTER — Other Ambulatory Visit: Payer: Self-pay | Admitting: Nurse Practitioner

## 2023-06-04 ENCOUNTER — Other Ambulatory Visit (HOSPITAL_COMMUNITY): Payer: Self-pay | Admitting: Emergency Medicine

## 2023-06-04 DIAGNOSIS — Z1211 Encounter for screening for malignant neoplasm of colon: Secondary | ICD-10-CM

## 2023-06-04 DIAGNOSIS — Z1212 Encounter for screening for malignant neoplasm of rectum: Secondary | ICD-10-CM

## 2023-06-04 NOTE — Progress Notes (Signed)
Paramedicine Encounter    Patient ID: Mark [name], male    DOB: 02-28-1961, 62 y.o.   MRN: [id number]   Complaints NONE  Assessment A&O x 4, skin W&D w/ good color.  Lung sounds clear throughout and no peripheral edema noted.  Compliance with meds YES  Pill box filled takes from bottles  Refills needed None  Meds changes since last visit Carvedilol 6.25mg . BID    Social changes none   BP 120/82 (BP Location: Left Arm, Patient Position: Sitting, Cuff Size: Normal)   Pulse 69   Resp 16   Wt 243 lb (110.2 kg)   SpO2 97%   BMI 36.95 kg/m  Weight yesterday-not taken Last visit weight-244lb  Home visit finds Mark [name] doing well.  He denies chest pain or SOB.  Lung sounds clear throughout and on edema noted.  He has been compliant with all medications.  We spent some time going through is bottle of Carvedilol.  He had put 12.5mg . tablets that he cut in half in the 6.25mg  bottle so we just separated the two pills into separate bottles and labeled them to prevent confusion. We discussed some nutritional food choices and ways to limit salt intake.   Next home visit scheduled for 06/11/23 @ 9:00.  ACTION: Home visit completed  [name] [phone] 06/04/23  Patient Care Team: [name], FNP as PCP - General (General Practice) O'Neal, Ronnald Ramp, MD as PCP - Cardiology (Cardiology)  Patient Active Problem List   Diagnosis Date Noted   COVID-19 vaccine administered 05/25/2023   Class 2 obesity due to excess calories with body mass index (BMI) of 37.0 to 37.9 in adult 05/25/2023   Chronic gout without tophus 03/31/2023   Hypertensive heart disease with chronic diastolic congestive heart failure (HCC) 01/12/2023   Pulmonary hypertension (HCC) 01/12/2023   Asymptomatic HIV infection, with no history of HIV-related illness (HCC) 01/12/2023   HFrEF (heart failure with reduced ejection fraction) (HCC) 10/16/2022   Chronic respiratory failure with  hypoxia (HCC) 10/16/2022   Acute on chronic diastolic (congestive) heart failure (HCC) 08/21/2022   OSA (obstructive sleep apnea) 08/21/2022   Thyroid nodule 01/15/2020   PAD (peripheral artery disease) (HCC) 05/28/2019   Type 2 diabetes mellitus with chronic kidney disease (HCC) 09/26/2018   Obesity (BMI 30-39.9) 09/26/2018   Chronic diastolic heart failure (HCC) 05/07/2018   CKD (chronic kidney disease), stage III (HCC) 05/07/2018   Cardiomegaly    Encntr for general adult medical exam w/o abnormal findings 04/25/2018   Hearing loss 04/25/2018   LBBB (left bundle branch block) 04/25/2018   Hyperlipidemia 04/25/2018   Other long term (current) drug therapy 04/25/2018   Other specified disorders of pigmentation 04/25/2018   Personal history of noncompliance with medical treatment, presenting hazards to health 04/25/2018   Sensorineural hearing loss (SNHL) of left ear 06/14/2017   Essential hypertension 06/05/2013   Class 2 severe obesity due to excess calories with serious comorbidity and body mass index (BMI) of 35.0 to 35.9 in adult (HCC) 06/05/2013   Pure hypercholesterolemia 06/05/2013   Vitamin D deficiency 06/05/2013   Corns and callosity 11/03/2010    Current Outpatient Medications:    allopurinol (ZYLOPRIM) 100 MG tablet, Take 0.5 tablets (50 mg total) by mouth daily. Take 50 mg by mouth every other day for gout prevention, Disp: 30 tablet, Rfl: 2   amLODipine (NORVASC) 5 MG tablet, Take 1 tablet (5 mg total) by mouth daily., Disp: 90 tablet, Rfl: 3   atorvastatin (  LIPITOR) 20 MG tablet, TAKE 1 TABLET BY MOUTH EVERY DAY, Disp: 90 tablet, Rfl: 1   bictegravir-emtricitabine-tenofovir AF (BIKTARVY) 50-200-25 MG TABS tablet, TAKE 1 TABLET BY MOUTH DAILY. TRY TO TAKE AT THE SAME TIME EACH DAY WITH OR WITHOUT FOOD., Disp: 30 tablet, Rfl: 5   carvedilol (COREG) 6.25 MG tablet, Take 1 tablet (6.25 mg total) by mouth 2 (two) times daily., Disp: 180 tablet, Rfl: 1   JARDIANCE 10 MG TABS  tablet, TAKE 1 TABLET BY MOUTH EVERY DAY, Disp: 30 tablet, Rfl: 3   losartan (COZAAR) 100 MG tablet, TAKE 1 TABLET BY MOUTH EVERY DAY, Disp: 90 tablet, Rfl: 1   metFORMIN (GLUCOPHAGE) 1000 MG tablet, TAKE 1 TABLET (1,000 MG TOTAL) BY MOUTH TWICE A DAY WITH FOOD, Disp: 180 tablet, Rfl: 1   spironolactone (ALDACTONE) 25 MG tablet, Take 0.5 tablets (12.5 mg total) by mouth daily., Disp: 45 tablet, Rfl: 3   torsemide (DEMADEX) 20 MG tablet, TAKE 2 TABLETS (40 MG TOTAL) BY MOUTH DAILY., Disp: 180 tablet, Rfl: 1   atorvastatin (LIPITOR) 10 MG tablet, Take 1 tablet (10 mg total) by mouth daily. (Patient not taking: Reported on 06/04/2023), Disp: 30 tablet, Rfl: 3 No Known Allergies   Social History   Socioeconomic History   Marital status: Divorced    Spouse name: Not on file   Number of children: 2   Years of education: Not on file   Highest education level: Not on file  Occupational History   Occupation: self-employed    Comment: recieves disability  Tobacco Use   Smoking status: Former    Current packs/day: 0.00    Types: Cigarettes    Start date: 1998    Quit date: 1999    Years since quitting: 25.7   Smokeless tobacco: Never   Tobacco comments:    1 pack would last one month--08/01/18  Vaping Use   Vaping status: Never Used  Substance and Sexual Activity   Alcohol use: No   Drug use: No   Sexual activity: Not Currently    Partners: Female    Comment: declied condom  Other Topics Concern   Not on file  Social History Narrative   Not on file   Social Determinants of Health   Financial Resource Strain: Low Risk  (05/06/2023)   Overall Financial Resource Strain (CARDIA)    Difficulty of Paying Living Expenses: Not hard at all  Food Insecurity: No Food Insecurity (05/06/2023)   Hunger Vital Sign    Worried About Running Out of Food in the Last Year: Never true    Ran Out of Food in the Last Year: Never true  Transportation Needs: No Transportation Needs (05/06/2023)   PRAPARE -  Administrator, Civil Service (Medical): No    Lack of Transportation (Non-Medical): No  Physical Activity: Sufficiently Active (05/06/2023)   Exercise Vital Sign    Days of Exercise per Week: 6 days    Minutes of Exercise per Session: 120 min  Stress: No Stress Concern Present (05/06/2023)   [employer] of Occupational Health - Occupational Stress Questionnaire    Feeling of Stress : Not at all  Social Connections: Moderately Isolated (05/06/2023)   Social Connection and Isolation Panel [NHANES]    Frequency of Communication with Friends and Family: More than three times a week    Frequency of Social Gatherings with Friends and Family: More than three times a week    Attends Religious Services: Never    Active  Member of Clubs or Organizations: Yes    Attends Banker Meetings: More than 4 times per year    Marital Status: Divorced  Intimate Partner Violence: Not At Risk (05/06/2023)   Humiliation, Afraid, Rape, and Kick questionnaire    Fear of Current or Ex-Partner: No    Emotionally Abused: No    Physically Abused: No    Sexually Abused: No    Physical Exam      Future Appointments  Date Time Provider Department Center  06/16/2023 10:00 AM [facility] PHARMACY [facility] None  07/15/2023 10:00 AM Dorthula Nettles, DO [facility] None  07/20/2023 10:30 AM Little, Karma Lew, RN [facility] None  08/10/2023  8:45 AM Danelle Earthly, MD [facility]  09/27/2023 10:40 AM [name], FNP [facility] None  05/10/2024 11:00 AM [facility]NUAL WELLNESS VISIT [facility] None

## 2023-06-09 ENCOUNTER — Encounter: Payer: Self-pay | Admitting: Nurse Practitioner

## 2023-06-09 NOTE — Assessment & Plan Note (Signed)
Covid 19 vaccine given in office observed for 15 minutes without any adverse reaction  

## 2023-06-09 NOTE — Assessment & Plan Note (Signed)
hgbA1c is stable. Continue current medications.

## 2023-06-09 NOTE — Assessment & Plan Note (Signed)
She is encouraged to strive for BMI less than 30 to decrease cardiac risk. Advised to aim for at least 150 minutes of exercise per week.  

## 2023-06-09 NOTE — Assessment & Plan Note (Signed)
Continue statin. 

## 2023-06-09 NOTE — Assessment & Plan Note (Signed)
Continue f/u with Cardiology.

## 2023-06-09 NOTE — Assessment & Plan Note (Signed)
Blood pressure is well controlled, continue current medications.

## 2023-06-09 NOTE — Assessment & Plan Note (Addendum)
He is to be wearing a CPAP but says he can not afford it. He has a referral to pulmonology to evaluate his oxygen treatment.

## 2023-06-09 NOTE — Assessment & Plan Note (Signed)
Cholesterol levels are stable, continue statin. Tolerating well.

## 2023-06-11 ENCOUNTER — Other Ambulatory Visit (HOSPITAL_COMMUNITY): Payer: Self-pay | Admitting: Emergency Medicine

## 2023-06-11 NOTE — Progress Notes (Signed)
Paramedicine Encounter    Patient ID: Mark [name], male    DOB: [birth year], 62 y.o.   MRN: [id number]   Complaints NONE   Assessment A&O x 4, skin W&D w good color.    Compliance with meds YES  Pill box filled takes from bottles  Refills needed NONE  Meds changes since last visit None    Social changes NONE   BP 108/78 (BP Location: Left Arm, Patient Position: Sitting, Cuff Size: Normal)   Pulse 67   Wt 245 lb (111.1 kg)   SpO2 97%   BMI 37.25 kg/m  Weight yesterday-not taken Last visit weight-243lb  [name] in great spirits this morning.  He denies chest pain or SOB.  Lung sounds clear and equal bilat and no peripheral edema noted.  Medications reviewed and he is compliant with same.  No special needs or concerns today.  Next home visit 06/18/23 @ 9:00,   ACTION: Home visit completed  Mark [name] [phone] 06/11/23  Patient Care Team: Mark Felts, FNP as PCP - General (General Practice) Mark [name], Mark Ramp, MD as PCP - Cardiology (Cardiology)  Patient Active Problem List   Diagnosis Date Noted   COVID-19 vaccine administered 05/25/2023   Class 2 obesity due to excess calories with body mass index (BMI) of 37.0 to 37.9 in adult 05/25/2023   Chronic gout without tophus 03/31/2023   Hypertensive heart disease with chronic diastolic congestive heart failure (HCC) 01/12/2023   Pulmonary hypertension (HCC) 01/12/2023   Asymptomatic HIV infection, with no history of HIV-related illness (HCC) 01/12/2023   HFrEF (heart failure with reduced ejection fraction) (HCC) 10/16/2022   Chronic respiratory failure with hypoxia (HCC) 10/16/2022   OSA (obstructive sleep apnea) 08/21/2022   Thyroid nodule 01/15/2020   PAD (peripheral artery disease) (HCC) 05/28/2019   Type 2 diabetes mellitus with chronic kidney disease (HCC) 09/26/2018   Obesity (BMI 30-39.9) 09/26/2018   Chronic diastolic heart failure (HCC) 05/07/2018   Cardiomegaly     Encntr for general adult medical exam w/o abnormal findings 04/25/2018   Hearing loss 04/25/2018   LBBB (left bundle branch block) 04/25/2018   Hyperlipidemia 04/25/2018   Other long term (current) drug therapy 04/25/2018   Other specified disorders of pigmentation 04/25/2018   Personal history of noncompliance with medical treatment, presenting hazards to health 04/25/2018   Sensorineural hearing loss (SNHL) of left ear 06/14/2017   Hypertensive heart disease 06/05/2013   Pure hypercholesterolemia 06/05/2013   Vitamin D deficiency 06/05/2013   Corns and callosity 11/03/2010    Current Outpatient Medications:    allopurinol (ZYLOPRIM) 100 MG tablet, Take 0.5 tablets (50 mg total) by mouth daily. Take 50 mg by mouth every other day for gout prevention, Disp: 30 tablet, Rfl: 2   amLODipine (NORVASC) 5 MG tablet, Take 1 tablet (5 mg total) by mouth daily., Disp: 90 tablet, Rfl: 3   atorvastatin (LIPITOR) 10 MG tablet, Take 1 tablet (10 mg total) by mouth daily. (Patient not taking: Reported on 06/04/2023), Disp: 30 tablet, Rfl: 3   atorvastatin (LIPITOR) 20 MG tablet, TAKE 1 TABLET BY MOUTH EVERY DAY, Disp: 90 tablet, Rfl: 1   bictegravir-emtricitabine-tenofovir AF (BIKTARVY) 50-200-25 MG TABS tablet, TAKE 1 TABLET BY MOUTH DAILY. TRY TO TAKE AT THE SAME TIME EACH DAY WITH OR WITHOUT FOOD., Disp: 30 tablet, Rfl: 5   carvedilol (COREG) 6.25 MG tablet, Take 1 tablet (6.25 mg total) by mouth 2 (two) times daily., Disp: 180 tablet, Rfl: 1   JARDIANCE  10 MG TABS tablet, TAKE 1 TABLET BY MOUTH EVERY DAY, Disp: 30 tablet, Rfl: 3   losartan (COZAAR) 100 MG tablet, TAKE 1 TABLET BY MOUTH EVERY DAY, Disp: 90 tablet, Rfl: 1   metFORMIN (GLUCOPHAGE) 1000 MG tablet, TAKE 1 TABLET (1,000 MG TOTAL) BY MOUTH TWICE A DAY WITH FOOD, Disp: 180 tablet, Rfl: 1   spironolactone (ALDACTONE) 25 MG tablet, Take 0.5 tablets (12.5 mg total) by mouth daily., Disp: 45 tablet, Rfl: 3   torsemide (DEMADEX) 20 MG tablet, TAKE  2 TABLETS (40 MG TOTAL) BY MOUTH DAILY., Disp: 180 tablet, Rfl: 1 No Known Allergies   Social History   Socioeconomic History   Marital status: Divorced    Spouse name: Not on file   Number of children: 2   Years of education: Not on file   Highest education level: Not on file  Occupational History   Occupation: self-employed    Comment: recieves disability  Tobacco Use   Smoking status: Former    Current packs/day: 0.00    Types: Cigarettes    Start date: 1998    Quit date: 1999    Years since quitting: 25.7   Smokeless tobacco: Never   Tobacco comments:    1 pack would last one month--08/01/18  Vaping Use   Vaping status: Never Used  Substance and Sexual Activity   Alcohol use: No   Drug use: No   Sexual activity: Not Currently    Partners: Female    Comment: declied condom  Other Topics Concern   Not on file  Social History Narrative   Not on file   Social Determinants of Health   Financial Resource Strain: Low Risk  (05/06/2023)   Overall Financial Resource Strain (CARDIA)    Difficulty of Paying Living Expenses: Not hard at all  Food Insecurity: No Food Insecurity (05/06/2023)   Hunger Vital Sign    Worried About Running Out of Food in the Last Year: Never true    Ran Out of Food in the Last Year: Never true  Transportation Needs: No Transportation Needs (05/06/2023)   PRAPARE - Administrator, Civil Service (Medical): No    Lack of Transportation (Non-Medical): No  Physical Activity: Sufficiently Active (05/06/2023)   Exercise Vital Sign    Days of Exercise per Week: 6 days    Minutes of Exercise per Session: 120 min  Stress: No Stress Concern Present (05/06/2023)   [employer] of Occupational Health - Occupational Stress Questionnaire    Feeling of Stress : Not at all  Social Connections: Moderately Isolated (05/06/2023)   Social Connection and Isolation Panel [NHANES]    Frequency of Communication with Friends and Family: More than three times a  week    Frequency of Social Gatherings with Friends and Family: More than three times a week    Attends Religious Services: Never    Database administrator or Organizations: Yes    Attends Engineer, structural: More than 4 times per year    Marital Status: Divorced  Intimate Partner Violence: Not At Risk (05/06/2023)   Humiliation, Afraid, Rape, and Kick questionnaire    Fear of Current or Ex-Partner: No    Emotionally Abused: No    Physically Abused: No    Sexually Abused: No    Physical Exam      Future Appointments  Date Time Provider Department Center  06/16/2023 10:00 AM [facility] PHARMACY [facility] None  07/15/2023 10:00 AM Dorthula Nettles, DO [facility]  None  07/20/2023 10:30 AM Little, Karma Lew, RN [facility] None  08/04/2023 11:00 AM Noemi Chapel, NP [facility] None  08/10/2023  8:45 AM Danelle Earthly, MD [facility]  08/10/2023  9:15 AM [facility] NURSE [facility]  09/27/2023 10:40 AM Mark Felts, FNP [facility] None  05/10/2024 11:00 AM [facility]-ANNUAL WELLNESS VISIT [facility] None

## 2023-06-14 NOTE — Progress Notes (Incomplete)
***In Progress***    Advanced Heart Failure Clinic Note  Referring Physician: [name], FNP  Primary Care: [name], FNP Primary Cardiologist: Armanda Magic HF Cardiologist: Dr. Gasper Lloyd  HPI:  Mark [name] is a 62 y.o. male w/ HFpEF, severe OSA, recently diagnosed HIV on Biktarvy (followed by Dr. Thedore Mins) admitted most recently in December 2023 for acute hypoxic respiratory failure believed to be secondary to acute chronic HFpEF exacerbation. During that admission he had a TTE w/ LVEF 50-55% and severe asymmetric septal hypertrophy but no dynamic LVOT gradient. He was diuresed with IV lasix, started on GDMT and discharged home. Since that time he has been seen in [facility] clinic where Jardiance/losartan were continued and carvedilol was increased to 12.5 mg BID. He has also followed with pulmonology in the interim. Since discharged in 07/2022 he has been on chronic 2L home O2 with some improvement in exercise capacity. He had PFTs in 2019 w/o any obstructive disease.    He was recently seen in AHF clinic with Dr.Sabharwal on 05/17/23. He had signifcant improvement in functional class since his last appointment. He was going to the [facility] everyday where he cycled and lifted weights. He was also slowly losing weight.   Today he returns to HF clinic for pharmacist medication titration. At last visit with MD, carvedilol was decreased to 6.25 mg BID, spironolactone 12.5 mg was held and amlodipine 5 mg was started.   Overall feeling ***. Dizziness, lightheadedness, fatigue:  Chest pain or palpitations:  How is your breathing?: *** SOB: Able to complete all ADLs. Activity level ***  Weight at home pounds. Takes furosemide/torsemide/bumex *** mg *** daily.  LEE PND/Orthopnea  Appetite *** Low-salt diet:   Physical Exam Cost/affordability of meds   HF Medications: Carvedilol 6.25 mg BID Losartan 100 mg daily Jardiance 10 mg daily Torsemide 40 mg daily Amlodipine 5 mg daily  Has  the patient been experiencing any side effects to the medications prescribed?  {YES NO:22349}  Does the patient have any problems obtaining medications due to transportation or finances?   {YES NO:22349}  Understanding of regimen: {excellent/good/fair/poor:19665} Understanding of indications: {excellent/good/fair/poor:19665} Potential of compliance: {excellent/good/fair/poor:19665} Patient understands to avoid NSAIDs. Patient understands to avoid decongestants.    Pertinent Lab Values: 05/17/23 Serum creatinine 1.88, BUN 22, Potassium 4.5, Sodium 141, BNP 69.6  Vital Signs: Weight: *** (last clinic weight: 249 lbs) Blood pressure: ***  Heart rate: ***   Assessment/Plan: Heart failure with preserved EF Etiology of AV:[id number] LVH on TTE; although, he has significant HTN, degree of LVH is concerning for infiltrative cardiomyopathy. CMR consistent with hypertensive heart disease. Myeloma panel previously negative; however, no FLC assay.  NYHA class / AHA Stage:IIB Volume status & Diuretics: Euvolemic on exam today Beta-Blocker: decrease carvedilol to 6.25mg  BID Vasodilators:Losartan 100mg  daily; add amlodipine 5mg  daily.  MRA: repeat lab work pending; will hold off for the time being.  Cardiometabolic:Jardiance 10mg  Devices therapies & Valvulopathies:CMR consistent with hypertensive heart disease Advanced therapies:Not indicated.    2. Pulmonary HTN - Likely group 3; on chronic O2; severe OHS/OSA. He will likely benefit mostly from O2 therapy. Will refer to pulmonology  - Will obtain serologic markers for PH work up today; I suspect they will be negative. Plan on V/Q scan. - Does not have a heavy smoking history.   - CTA PE from 08/21/22 without interstitial lung disease.    3. Evaluation for Cardiac amyloid - PYP scan is grade 1 - Negative myeloma panel (no M spike etc) -At  this time I do not think his PYP scan is positive.  LVH is likely secondary to uncontrolled hypertension.   CMR not consistent with amyloid.    4. HTN - start amlodipine 5mg  daily.  - losartan 100mg  daily   5. OSA - untreated, sleep study in 2015 showed severe OSA w/ AHI 41.5  - discussed association between untreated OSA and CV disease/ HTN - reported not using CPAP machine due to cost - followed by Dr. Mayford Knife.    6. Obesity  - Body mass index is 35.93 kg/m. - wt loss advised    7. CKD IIIb  - b/l Scr previously  ~1.4 - repeat BMP today   8. HLD - LDL elevated, 110 mg/dL - on atorvastatin 20  - followed by cardiology    9. HIV - Followed by ID, Dr. Thedore Mins  - continue Biktarvy     Follow up in 4 weeks with Dr. Gasper Lloyd.   Karle Plumber, PharmD, BCPS, [facility], CPP Heart Failure Clinic Pharmacist [phone]

## 2023-06-15 ENCOUNTER — Ambulatory Visit (HOSPITAL_COMMUNITY)
Admission: RE | Admit: 2023-06-15 | Discharge: 2023-06-15 | Disposition: A | Discharge status: Home / Self Care | Payer: Medicare HMO | Source: Ambulatory Visit | Attending: Cardiology | Admitting: Cardiology

## 2023-06-15 VITALS — BP 110/76 | HR 73 | Wt 249.8 lb

## 2023-06-15 DIAGNOSIS — E785 Hyperlipidemia, unspecified: Secondary | ICD-10-CM | POA: Diagnosis not present

## 2023-06-15 DIAGNOSIS — G4733 Obstructive sleep apnea (adult) (pediatric): Secondary | ICD-10-CM | POA: Insufficient documentation

## 2023-06-15 DIAGNOSIS — I5032 Chronic diastolic (congestive) heart failure: Secondary | ICD-10-CM | POA: Diagnosis not present

## 2023-06-15 DIAGNOSIS — I130 Hypertensive heart and chronic kidney disease with heart failure and stage 1 through stage 4 chronic kidney disease, or unspecified chronic kidney disease: Secondary | ICD-10-CM | POA: Diagnosis not present

## 2023-06-15 DIAGNOSIS — Z21 Asymptomatic human immunodeficiency virus [HIV] infection status: Secondary | ICD-10-CM | POA: Diagnosis not present

## 2023-06-15 DIAGNOSIS — I272 Pulmonary hypertension, unspecified: Secondary | ICD-10-CM | POA: Insufficient documentation

## 2023-06-15 DIAGNOSIS — N1832 Chronic kidney disease, stage 3b: Secondary | ICD-10-CM | POA: Insufficient documentation

## 2023-06-15 DIAGNOSIS — E669 Obesity, unspecified: Secondary | ICD-10-CM | POA: Diagnosis not present

## 2023-06-15 DIAGNOSIS — Z6835 Body mass index (BMI) 35.0-35.9, adult: Secondary | ICD-10-CM | POA: Insufficient documentation

## 2023-06-15 DIAGNOSIS — Z79899 Other long term (current) drug therapy: Secondary | ICD-10-CM | POA: Insufficient documentation

## 2023-06-15 DIAGNOSIS — I5030 Unspecified diastolic (congestive) heart failure: Secondary | ICD-10-CM | POA: Insufficient documentation

## 2023-06-15 LAB — BRAIN NATRIURETIC PEPTIDE: B Natriuretic Peptide: 71.6 pg/mL (ref 0.0–100.0)

## 2023-06-15 LAB — BASIC METABOLIC PANEL
Anion gap: 9 (ref 5–15)
BUN: 28 mg/dL — ABNORMAL HIGH (ref 8–23)
CO2: 31 mmol/L (ref 22–32)
Calcium: 9.5 mg/dL (ref 8.9–10.3)
Chloride: 101 mmol/L (ref 98–111)
Creatinine, Ser: 1.95 mg/dL — ABNORMAL HIGH (ref 0.61–1.24)
GFR, Estimated: 38 mL/min — ABNORMAL LOW (ref >60)
Glucose, Bld: 134 mg/dL — ABNORMAL HIGH (ref 70–99)
Potassium: 4.2 mmol/L (ref 3.5–5.1)
Sodium: 141 mmol/L (ref 135–145)

## 2023-06-15 NOTE — Patient Instructions (Signed)
It was a pleasure seeing you today!  MEDICATIONS: -No medication changes today -Call if you have questions about your medications.  LABS: -We will call you if your labs need attention.  NEXT APPOINTMENT: Return to clinic in 1 month with Dr. Gasper Lloyd.  In general, to take care of your heart failure: -Limit your fluid intake to 2 Liters (half-gallon) per day.   -Limit your salt intake to ideally 2-3 grams (2000-3000 mg) per day. -Weigh yourself daily and record, and bring that "weight diary" to your next appointment.  (Weight gain of 2-3 pounds in 1 day typically means fluid weight.) -The medications for your heart are to help your heart and help you live longer.   -Please contact us before stopping any of your heart medications.  Call the clinic at [phone] with questions or to reschedule future appointments.

## 2023-06-15 NOTE — Progress Notes (Signed)
Advanced Heart Failure Clinic Note   Referring Physician: [name], FNP  Primary Care: [name], FNP Primary Cardiologist: Armanda Magic HF Cardiologist: Dr. Gasper Lloyd  HPI:  Mark [name] is a 62 y.o. male w/ HFpEF, severe OSA, recently diagnosed HIV on Biktarvy (followed by Dr. Thedore Mins) admitted most recently in December 2023 for acute hypoxic respiratory failure believed to be secondary to acute chronic HFpEF exacerbation. During that admission, he had a TTE w/ LVEF 50-55% and severe asymmetric septal hypertrophy but no dynamic LVOT gradient. He was diuresed with IV Lasix, started on GDMT and discharged home. Since that time he had been seen in [facility] clinic where Jardiance/losartan were continued and carvedilol was increased to 12.5 mg BID. He had also followed with pulmonology in the interim. Since discharge in 07/2022 he have been on chronic 2L home O2 with some improvement in exercise capacity. He had PFTs in 2019 w/o any obstructive disease.    He was recently seen in AHF clinic with Dr. Gasper Lloyd on 05/17/23. He had significant improvement in functional class since his last appointment. He was going to the [facility] everyday where he cycled and lifted weights. He was also slowly losing weight.   Today he returns to HF clinic for pharmacist medication titration. At last visit with MD, carvedilol was decreased to 6.25 mg BID, spironolactone 12.5 mg daily was started and amlodipine 5 mg daily was started. Overall feeling great and has been very active. He works out at [employer] every day. No dizziness or lightheadedness. No CP/palpitations. No SOB/DOE on 2L O2. Capable of walking a mile and engaging in plenty of cardio at the gym without any problems. Weight at home fluctuates between 240-247 lbs. In clinic, weight has been stable at 249 lbs. He takes torsemide 40 mg daily and has not needed any extra. No LEE, PND or orthopnea. Appetite is good. Has been eating smaller portions and cut out  all meat except fish. He is now enrolled in the HF paramedicine program, Dede helps with his medications. He wants to know if he can ever get off O2, plans to discuss with pulmonologist.    HF Medications: Carvedilol 6.25 mg BID Losartan 100 mg daily Spironolactone 12.5 mg daily Jardiance 10 mg daily Torsemide 40 mg daily  Has the patient been experiencing any side effects to the medications prescribed? No  Does the patient have any problems obtaining medications due to transportation or finances? No - [employer]   Understanding of regimen: good Understanding of indications: good Potential of compliance: good Patient understands to avoid NSAIDs. Patient understands to avoid decongestants.    Pertinent Lab Values: 05/17/23 Serum creatinine 1.88, BUN 22, Potassium 4.5, Sodium 141, BNP 69.6 BMET, BNP today pending  Vital Signs: Weight: 249.8 lbs (last clinic weight: 249 lbs) Blood pressure: 110/76 mmHg  Heart rate: 73 bpm   Assessment/Plan: Heart failure with preserved EF Etiology of WU:[id number] LVH on TTE; although, he had significant HTN, degree of LVH was concerning for infiltrative cardiomyopathy. CMR consistent with hypertensive heart disease. Myeloma panel previously negative; however, no FLC assay.  NYHA class / AHA Stage:IIB Volume status & Diuretics: Euvolemic on exam today; continue torsemide 40 mg daily Beta-Blocker: continue carvedilol 6.25 mg BID Vasodilators: continue losartan 100 mg daily MRA: continue spironolactone 12.5 mg daily, consider increasing if labs are stable; BMET/BNP today pending Cardiometabolic: continue Jardiance 10 mg daily Devices therapies & Valvulopathies: CMR consistent with hypertensive heart disease Advanced therapies:Not indicated.  2. Pulmonary HTN - Likely group 3; on chronic 2L O2; severe OHS/OSA. He would likely benefit mostly from O2 therapy. Was referred to pulmonologist. Has appointment scheduled for December.  - Did not have  a heavy smoking history.   - CTA PE from 08/21/22 without interstitial lung disease.    3. Evaluation for Cardiac amyloid - PYP scan is grade 1 - Negative myeloma panel (no M spike etc) - PYP scan was not positive. LVH was likely secondary to uncontrolled hypertension. CMR not consistent with amyloid.    4. HTN - continue amlodipine 5 mg daily - continue losartan 100 mg daily   5. OSA - untreated, sleep study in 2015 showed severe OSA w/ AHI 41.5  - Dr. Gasper Lloyd previously discussed association between untreated OSA and CV disease/ HTN - reported not using CPAP machine due to cost - followed by Dr. Mayford Knife.    6. Obesity  - Body mass index is 35.93 kg/m. - wt loss advised  - Has been doing well with diet and exercise, congratulated him on his efforts.    7. CKD IIIb  - Scr 1.88 (05/17/23) - repeat BMET today   8. HLD - LDL elevated, 110 mg/dL (1/61/09) - continue atorvastatin 20 mg daily - followed by cardiology    9. HIV - Followed by ID, Dr. Thedore Mins  - continue Biktarvy 50/200/25 mg daily    Follow up in 4 weeks with Dr. Gasper Lloyd.   Karle Plumber, PharmD, BCPS, [facility], CPP Heart Failure Clinic Pharmacist [phone]

## 2023-06-16 ENCOUNTER — Inpatient Hospital Stay (HOSPITAL_COMMUNITY): Admission: RE | Admit: 2023-06-16 | Payer: Medicare HMO | Source: Ambulatory Visit

## 2023-06-18 ENCOUNTER — Other Ambulatory Visit (HOSPITAL_COMMUNITY): Payer: Self-pay | Admitting: Emergency Medicine

## 2023-06-18 NOTE — Progress Notes (Signed)
Paramedicine Encounter    Patient ID: Mark [name], male    DOB: [birth year], 62 y.o.   MRN: [id number]   Complaints NONE  Assessment A&O x4, skin W&D w/ good color.  Lung sounds clear and equal throughout.  No peripheral edema noted  Compliance with meds YES  Pill box filled n/a  Refills needed none  Meds changes since last visit NONE    Social changes NONE   BP (!) 140/80 (BP Location: Left Arm, Patient Position: Sitting, Cuff Size: Normal)   Pulse 60   Resp 16   Wt 245 lb (111.1 kg)   SpO2 98%   BMI 37.25 kg/m  Weight yesterday-243lb  Last visit weight-245lb   [name] in great spirits and reporting to be feeling well.  He denies chest pain or SOB.  Lung sounds clear and equal bilat.  No peripheral edema noted. Compliant with all meds.  He states that after this home visit he plans on going to the gym.   Next home visit scheduled  07/02/23 @ 9:00.   ACTION: Home visit completed  [name] [phone] 06/18/23  Patient Care Team: [name], FNP as PCP - General (General Practice) O'Neal, Ronnald Ramp, MD as PCP - Cardiology (Cardiology)  Patient Active Problem List   Diagnosis Date Noted   COVID-19 vaccine administered 05/25/2023   Class 2 obesity due to excess calories with body mass index (BMI) of 37.0 to 37.9 in adult 05/25/2023   Chronic gout without tophus 03/31/2023   Hypertensive heart disease with chronic diastolic congestive heart failure (HCC) 01/12/2023   Pulmonary hypertension (HCC) 01/12/2023   Asymptomatic HIV infection, with no history of HIV-related illness (HCC) 01/12/2023   HFrEF (heart failure with reduced ejection fraction) (HCC) 10/16/2022   Chronic respiratory failure with hypoxia (HCC) 10/16/2022   OSA (obstructive sleep apnea) 08/21/2022   Thyroid nodule 01/15/2020   PAD (peripheral artery disease) (HCC) 05/28/2019   Type 2 diabetes mellitus with chronic kidney disease (HCC) 09/26/2018   Obesity  (BMI 30-39.9) 09/26/2018   Chronic diastolic heart failure (HCC) 05/07/2018   Cardiomegaly    Encntr for general adult medical exam w/o abnormal findings 04/25/2018   Hearing loss 04/25/2018   LBBB (left bundle branch block) 04/25/2018   Hyperlipidemia 04/25/2018   Other long term (current) drug therapy 04/25/2018   Other specified disorders of pigmentation 04/25/2018   Personal history of noncompliance with medical treatment, presenting hazards to health 04/25/2018   Sensorineural hearing loss (SNHL) of left ear 06/14/2017   Hypertensive heart disease 06/05/2013   Pure hypercholesterolemia 06/05/2013   Vitamin D deficiency 06/05/2013   Corns and callosity 11/03/2010    Current Outpatient Medications:    allopurinol (ZYLOPRIM) 100 MG tablet, Take 0.5 tablets (50 mg total) by mouth daily. Take 50 mg by mouth every other day for gout prevention, Disp: 30 tablet, Rfl: 2   amLODipine (NORVASC) 5 MG tablet, Take 1 tablet (5 mg total) by mouth daily., Disp: 90 tablet, Rfl: 3   atorvastatin (LIPITOR) 20 MG tablet, TAKE 1 TABLET BY MOUTH EVERY DAY, Disp: 90 tablet, Rfl: 1   bictegravir-emtricitabine-tenofovir AF (BIKTARVY) 50-200-25 MG TABS tablet, TAKE 1 TABLET BY MOUTH DAILY. TRY TO TAKE AT THE SAME TIME EACH DAY WITH OR WITHOUT FOOD., Disp: 30 tablet, Rfl: 5   carvedilol (COREG) 6.25 MG tablet, Take 1 tablet (6.25 mg total) by mouth 2 (two) times daily., Disp: 180 tablet, Rfl: 1   JARDIANCE 10 MG TABS tablet, TAKE  1 TABLET BY MOUTH EVERY DAY, Disp: 30 tablet, Rfl: 3   losartan (COZAAR) 100 MG tablet, TAKE 1 TABLET BY MOUTH EVERY DAY, Disp: 90 tablet, Rfl: 1   metFORMIN (GLUCOPHAGE) 1000 MG tablet, TAKE 1 TABLET (1,000 MG TOTAL) BY MOUTH TWICE A DAY WITH FOOD, Disp: 180 tablet, Rfl: 1   spironolactone (ALDACTONE) 25 MG tablet, Take 0.5 tablets (12.5 mg total) by mouth daily., Disp: 45 tablet, Rfl: 3   torsemide (DEMADEX) 20 MG tablet, TAKE 2 TABLETS (40 MG TOTAL) BY MOUTH DAILY., Disp: 180  tablet, Rfl: 1   atorvastatin (LIPITOR) 10 MG tablet, Take 1 tablet (10 mg total) by mouth daily. (Patient not taking: Reported on 06/15/2023), Disp: 30 tablet, Rfl: 3 No Known Allergies   Social History   Socioeconomic History   Marital status: Divorced    Spouse name: Not on file   Number of children: 2   Years of education: Not on file   Highest education level: Not on file  Occupational History   Occupation: self-employed    Comment: recieves disability  Tobacco Use   Smoking status: Former    Current packs/day: 0.00    Types: Cigarettes    Start date: 1998    Quit date: 1999    Years since quitting: 25.8   Smokeless tobacco: Never   Tobacco comments:    1 pack would last one month--08/01/18  Vaping Use   Vaping status: Never Used  Substance and Sexual Activity   Alcohol use: No   Drug use: No   Sexual activity: Not Currently    Partners: Female    Comment: declied condom  Other Topics Concern   Not on file  Social History Narrative   Not on file   Social Determinants of Health   Financial Resource Strain: Low Risk  (05/06/2023)   Overall Financial Resource Strain (CARDIA)    Difficulty of Paying Living Expenses: Not hard at all  Food Insecurity: No Food Insecurity (05/06/2023)   Hunger Vital Sign    Worried About Running Out of Food in the Last Year: Never true    Ran Out of Food in the Last Year: Never true  Transportation Needs: No Transportation Needs (05/06/2023)   PRAPARE - Administrator, Civil Service (Medical): No    Lack of Transportation (Non-Medical): No  Physical Activity: Sufficiently Active (05/06/2023)   Exercise Vital Sign    Days of Exercise per Week: 6 days    Minutes of Exercise per Session: 120 min  Stress: No Stress Concern Present (05/06/2023)   [employer] of Occupational Health - Occupational Stress Questionnaire    Feeling of Stress : Not at all  Social Connections: Moderately Isolated (05/06/2023)   Social Connection and  Isolation Panel [NHANES]    Frequency of Communication with Friends and Family: More than three times a week    Frequency of Social Gatherings with Friends and Family: More than three times a week    Attends Religious Services: Never    Database administrator or Organizations: Yes    Attends Engineer, structural: More than 4 times per year    Marital Status: Divorced  Intimate Partner Violence: Not At Risk (05/06/2023)   Humiliation, Afraid, Rape, and Kick questionnaire    Fear of Current or Ex-Partner: No    Emotionally Abused: No    Physically Abused: No    Sexually Abused: No    Physical Exam      Future Appointments  Date Time Provider Department Center  07/15/2023 10:00 AM Dorthula Nettles, DO [facility] None  07/20/2023 10:30 AM Little, Karma Lew, RN [facility] None  08/04/2023 11:00 AM Noemi Chapel, NP [facility] None  08/10/2023  8:45 AM Danelle Earthly, MD [facility]  08/10/2023  9:15 AM [facility] NURSE [facility]  09/27/2023 10:40 AM [name], FNP [facility] None  05/10/2024 11:00 AM [facility]-ANNUAL WELLNESS VISIT [facility] None

## 2023-06-20 ENCOUNTER — Other Ambulatory Visit: Payer: Self-pay | Admitting: Nurse Practitioner

## 2023-06-20 DIAGNOSIS — I5032 Chronic diastolic (congestive) heart failure: Secondary | ICD-10-CM

## 2023-07-02 ENCOUNTER — Other Ambulatory Visit (HOSPITAL_COMMUNITY): Payer: Self-pay | Admitting: Emergency Medicine

## 2023-07-02 NOTE — Progress Notes (Signed)
Paramedicine Encounter    Patient ID: Mark [name], male    DOB: [birth year], 62 y.o.   MRN: [id number]   Complaints NONE  Assessment A&O x 4, skin W&D w/ color.  Denies chest pain or SOB.   Lung sounds clear and equal bilat.  No peripheral edema noted.    Compliance with meds YES  Pill box filled n/a  Refills needed NONE  Meds changes since last visit NONE    Social changes NONE   BP 120/80 (BP Location: Left Arm, Patient Position: Sitting, Cuff Size: Normal)   Pulse 68   Resp 14   Wt 243 lb (110.2 kg)   SpO2 97%   BMI 36.95 kg/m  Weight yesterday-243lb Last visit weight-245lb  Mark [name] reports to feeling well.  He denies chest pain or SOB.  Lung sounds clear and equal bilat.  No peripheral edema noted.  Reviewed meds w/ pt.  He takes his meds from pill bottles and is compliant with all meds.  He continues to go to the gym daily and says he knows this his helping him feel better and make his heart stronger. Next home visit 07/09/23 @ 9:00  ACTION: Home visit completed  [name] [phone] 07/02/23  Patient Care Team: Mark Felts, FNP as PCP - General (General Practice) [name], Mark Ramp, MD as PCP - Cardiology (Cardiology)  Patient Active Problem List   Diagnosis Date Noted   COVID-19 vaccine administered 05/25/2023   Class 2 obesity due to excess calories with body mass index (BMI) of 37.0 to 37.9 in adult 05/25/2023   Chronic gout without tophus 03/31/2023   Hypertensive heart disease with chronic diastolic congestive heart failure (HCC) 01/12/2023   Pulmonary hypertension (HCC) 01/12/2023   Asymptomatic HIV infection, with no history of HIV-related illness (HCC) 01/12/2023   HFrEF (heart failure with reduced ejection fraction) (HCC) 10/16/2022   Chronic respiratory failure with hypoxia (HCC) 10/16/2022   OSA (obstructive sleep apnea) 08/21/2022   Thyroid nodule 01/15/2020   PAD (peripheral artery disease) (HCC) 05/28/2019    Type 2 diabetes mellitus with chronic kidney disease (HCC) 09/26/2018   Obesity (BMI 30-39.9) 09/26/2018   Chronic diastolic heart failure (HCC) 05/07/2018   Cardiomegaly    Encntr for general adult medical exam w/o abnormal findings 04/25/2018   Hearing loss 04/25/2018   LBBB (left bundle branch block) 04/25/2018   Hyperlipidemia 04/25/2018   Other long term (current) drug therapy 04/25/2018   Other specified disorders of pigmentation 04/25/2018   Personal history of noncompliance with medical treatment, presenting hazards to health 04/25/2018   Sensorineural hearing loss (SNHL) of left ear 06/14/2017   Hypertensive heart disease 06/05/2013   Pure hypercholesterolemia 06/05/2013   Vitamin D deficiency 06/05/2013   Corns and callosity 11/03/2010    Current Outpatient Medications:    allopurinol (ZYLOPRIM) 100 MG tablet, Take 0.5 tablets (50 mg total) by mouth daily. Take 50 mg by mouth every other day for gout prevention, Disp: 30 tablet, Rfl: 2   amLODipine (NORVASC) 5 MG tablet, Take 1 tablet (5 mg total) by mouth daily., Disp: 90 tablet, Rfl: 3   atorvastatin (LIPITOR) 10 MG tablet, Take 1 tablet (10 mg total) by mouth daily. (Patient not taking: Reported on 06/15/2023), Disp: 30 tablet, Rfl: 3   atorvastatin (LIPITOR) 20 MG tablet, TAKE 1 TABLET BY MOUTH EVERY DAY, Disp: 90 tablet, Rfl: 1   bictegravir-emtricitabine-tenofovir AF (BIKTARVY) 50-200-25 MG TABS tablet, TAKE 1 TABLET BY MOUTH DAILY. TRY TO TAKE AT  THE SAME TIME EACH DAY WITH OR WITHOUT FOOD., Disp: 30 tablet, Rfl: 5   carvedilol (COREG) 6.25 MG tablet, Take 1 tablet (6.25 mg total) by mouth 2 (two) times daily., Disp: 180 tablet, Rfl: 1   JARDIANCE 10 MG TABS tablet, TAKE 1 TABLET BY MOUTH EVERY DAY, Disp: 30 tablet, Rfl: 3   losartan (COZAAR) 100 MG tablet, TAKE 1 TABLET BY MOUTH EVERY DAY, Disp: 90 tablet, Rfl: 1   metFORMIN (GLUCOPHAGE) 1000 MG tablet, TAKE 1 TABLET (1,000 MG TOTAL) BY MOUTH TWICE A DAY WITH FOOD, Disp:  180 tablet, Rfl: 1   spironolactone (ALDACTONE) 25 MG tablet, Take 0.5 tablets (12.5 mg total) by mouth daily., Disp: 45 tablet, Rfl: 3   torsemide (DEMADEX) 20 MG tablet, TAKE 2 TABLETS (40 MG TOTAL) BY MOUTH DAILY., Disp: 180 tablet, Rfl: 1 No Known Allergies   Social History   Socioeconomic History   Marital status: Divorced    Spouse name: Not on file   Number of children: 2   Years of education: Not on file   Highest education level: Not on file  Occupational History   Occupation: self-employed    Comment: recieves disability  Tobacco Use   Smoking status: Former    Current packs/day: 0.00    Types: Cigarettes    Start date: 1998    Quit date: 1999    Years since quitting: 25.8   Smokeless tobacco: Never   Tobacco comments:    1 pack would last one month--08/01/18  Vaping Use   Vaping status: Never Used  Substance and Sexual Activity   Alcohol use: No   Drug use: No   Sexual activity: Not Currently    Partners: Female    Comment: declied condom  Other Topics Concern   Not on file  Social History Narrative   Not on file   Social Determinants of Health   Financial Resource Strain: Low Risk  (05/06/2023)   Overall Financial Resource Strain (CARDIA)    Difficulty of Paying Living Expenses: Not hard at all  Food Insecurity: No Food Insecurity (05/06/2023)   Hunger Vital Sign    Worried About Running Out of Food in the Last Year: Never true    Ran Out of Food in the Last Year: Never true  Transportation Needs: No Transportation Needs (05/06/2023)   PRAPARE - Administrator, Civil Service (Medical): No    Lack of Transportation (Non-Medical): No  Physical Activity: Sufficiently Active (05/06/2023)   Exercise Vital Sign    Days of Exercise per Week: 6 days    Minutes of Exercise per Session: 120 min  Stress: No Stress Concern Present (05/06/2023)   [employer] of Occupational Health - Occupational Stress Questionnaire    Feeling of Stress : Not at all   Social Connections: Moderately Isolated (05/06/2023)   Social Connection and Isolation Panel [NHANES]    Frequency of Communication with Friends and Family: More than three times a week    Frequency of Social Gatherings with Friends and Family: More than three times a week    Attends Religious Services: Never    Database administrator or Organizations: Yes    Attends Engineer, structural: More than 4 times per year    Marital Status: Divorced  Intimate Partner Violence: Not At Risk (05/06/2023)   Humiliation, Afraid, Rape, and Kick questionnaire    Fear of Current or Ex-Partner: No    Emotionally Abused: No    Physically Abused:  No    Sexually Abused: No    Physical Exam      Future Appointments  Date Time Provider Department Center  07/15/2023 10:00 AM Dorthula Nettles, DO [facility] None  07/20/2023 10:30 AM Little, Karma Lew, RN [facility] None  08/04/2023 11:00 AM Noemi Chapel, NP [facility] None  08/10/2023  8:45 AM Danelle Earthly, MD [facility]  08/10/2023  9:15 AM [facility] NURSE [facility]  09/27/2023 10:40 AM Mark Felts, FNP [facility] None  05/10/2024 11:00 AM [facility]-ANNUAL WELLNESS VISIT [facility] None

## 2023-07-07 ENCOUNTER — Telehealth (HOSPITAL_COMMUNITY): Payer: Self-pay | Admitting: Licensed Clinical Social Worker

## 2023-07-07 NOTE — Telephone Encounter (Signed)
HF Paramedicine Team Based Care Meeting  One Month Post Enrollment Assessment  HF MD- NA  HF NP - Amy Clegg NP-C   [facility] HF Paramedicine  Katie Vicente Males  [facility] admit within the last 30 days for heart failure? no  Medications concerns? Compliant- takes them out of the bottle but can verbalize meds and what they are for.  Education needs? no  Barriers to discharge? Coming to MD appt next week for follow up- will consider DC if stable at that time and he is independent with his needs.   Burna Sis, LCSW Clinical Social Worker Advanced Heart Failure Clinic Desk#: [phone] Cell#: [phone]

## 2023-07-09 ENCOUNTER — Other Ambulatory Visit (HOSPITAL_COMMUNITY): Payer: Self-pay | Admitting: Emergency Medicine

## 2023-07-09 NOTE — Progress Notes (Signed)
Paramedicine Encounter    Patient ID: Mark [name], male    DOB: Nov 08, 1960, 62 y.o.   MRN: [id number]   Complaints NONE  Assessment A&O x 4, skin W&D w/ good color.  Denies chest pain or SOB.  Lung sounds clear  Compliance with meds YES  Pill box filled n/a  Refills needed NONE  Meds changes since last visit NONE    Social changes NONE   BP 118/80 (BP Location: Right Leg, Patient Position: Sitting, Cuff Size: Normal)   Pulse 70   Resp 14   Wt 245 lb (111.1 kg)   SpO2 99%   BMI 37.25 kg/m  Weight yesterday-not taken Last visit weight-243lb   ATF Mr. Bookwalter A&O x 4, skin W&D w/ good color.  Pt denies chest pain or SOB.  Lung sounds clear and equal throughout No edema noted to lower extremities. Pt. Is compliant with all meds and each were reviewed.  He is able to tell me what each medicine is for and how much of each he takes. Pt tells me he goes to the gym 6 days a week and wants to do all he can to strengthen his heart.  He states hopes to one day be able to accomplish only having to wear his oxygen at night but not sure that will ever be possible.   He maintains a positive attitude regarding his heart failure and taking care of himself.  He does not eat meat.  He watches his salt intake and Googles heart healthy recipes to try and keep on track. Next visit with be with pt at the heart failure clinic 07/15/23 @ 10:00 w/ Dr. Gasper Lloyd and I will attend this visit with him.   ACTION: Home visit completed  [name] [phone] 07/09/23  Patient Care Team: [name], FNP as PCP - General (General Practice) O'Neal, Ronnald Ramp, MD as PCP - Cardiology (Cardiology)  Patient Active Problem List   Diagnosis Date Noted   COVID-19 vaccine administered 05/25/2023   Class 2 obesity due to excess calories with body mass index (BMI) of 37.0 to 37.9 in adult 05/25/2023   Chronic gout without tophus 03/31/2023   Hypertensive heart disease with  chronic diastolic congestive heart failure (HCC) 01/12/2023   Pulmonary hypertension (HCC) 01/12/2023   Asymptomatic HIV infection, with no history of HIV-related illness (HCC) 01/12/2023   HFrEF (heart failure with reduced ejection fraction) (HCC) 10/16/2022   Chronic respiratory failure with hypoxia (HCC) 10/16/2022   OSA (obstructive sleep apnea) 08/21/2022   Thyroid nodule 01/15/2020   PAD (peripheral artery disease) (HCC) 05/28/2019   Type 2 diabetes mellitus with chronic kidney disease (HCC) 09/26/2018   Obesity (BMI 30-39.9) 09/26/2018   Chronic diastolic heart failure (HCC) 05/07/2018   Cardiomegaly    Encntr for general adult medical exam w/o abnormal findings 04/25/2018   Hearing loss 04/25/2018   LBBB (left bundle branch block) 04/25/2018   Hyperlipidemia 04/25/2018   Other long term (current) drug therapy 04/25/2018   Other specified disorders of pigmentation 04/25/2018   Personal history of noncompliance with medical treatment, presenting hazards to health 04/25/2018   Sensorineural hearing loss (SNHL) of left ear 06/14/2017   Hypertensive heart disease 06/05/2013   Pure hypercholesterolemia 06/05/2013   Vitamin D deficiency 06/05/2013   Corns and callosity 11/03/2010    Current Outpatient Medications:    allopurinol (ZYLOPRIM) 100 MG tablet, Take 0.5 tablets (50 mg total) by mouth daily. Take 50 mg by mouth every other day for  gout prevention, Disp: 30 tablet, Rfl: 2   amLODipine (NORVASC) 5 MG tablet, Take 1 tablet (5 mg total) by mouth daily., Disp: 90 tablet, Rfl: 3   atorvastatin (LIPITOR) 10 MG tablet, Take 1 tablet (10 mg total) by mouth daily. (Patient not taking: Reported on 06/15/2023), Disp: 30 tablet, Rfl: 3   atorvastatin (LIPITOR) 20 MG tablet, TAKE 1 TABLET BY MOUTH EVERY DAY, Disp: 90 tablet, Rfl: 1   bictegravir-emtricitabine-tenofovir AF (BIKTARVY) 50-200-25 MG TABS tablet, TAKE 1 TABLET BY MOUTH DAILY. TRY TO TAKE AT THE SAME TIME EACH DAY WITH OR  WITHOUT FOOD., Disp: 30 tablet, Rfl: 5   carvedilol (COREG) 6.25 MG tablet, Take 1 tablet (6.25 mg total) by mouth 2 (two) times daily., Disp: 180 tablet, Rfl: 1   JARDIANCE 10 MG TABS tablet, TAKE 1 TABLET BY MOUTH EVERY DAY, Disp: 30 tablet, Rfl: 3   losartan (COZAAR) 100 MG tablet, TAKE 1 TABLET BY MOUTH EVERY DAY, Disp: 90 tablet, Rfl: 1   metFORMIN (GLUCOPHAGE) 1000 MG tablet, TAKE 1 TABLET (1,000 MG TOTAL) BY MOUTH TWICE A DAY WITH FOOD, Disp: 180 tablet, Rfl: 1   spironolactone (ALDACTONE) 25 MG tablet, Take 0.5 tablets (12.5 mg total) by mouth daily., Disp: 45 tablet, Rfl: 3   torsemide (DEMADEX) 20 MG tablet, TAKE 2 TABLETS (40 MG TOTAL) BY MOUTH DAILY., Disp: 180 tablet, Rfl: 1 No Known Allergies   Social History   Socioeconomic History   Marital status: Divorced    Spouse name: Not on file   Number of children: 2   Years of education: Not on file   Highest education level: Not on file  Occupational History   Occupation: self-employed    Comment: recieves disability  Tobacco Use   Smoking status: Former    Current packs/day: 0.00    Types: Cigarettes    Start date: 1998    Quit date: 1999    Years since quitting: 25.8   Smokeless tobacco: Never   Tobacco comments:    1 pack would last one month--08/01/18  Vaping Use   Vaping status: Never Used  Substance and Sexual Activity   Alcohol use: No   Drug use: No   Sexual activity: Not Currently    Partners: Female    Comment: declied condom  Other Topics Concern   Not on file  Social History Narrative   Not on file   Social Determinants of Health   Financial Resource Strain: Low Risk  (05/06/2023)   Overall Financial Resource Strain (CARDIA)    Difficulty of Paying Living Expenses: Not hard at all  Food Insecurity: No Food Insecurity (05/06/2023)   Hunger Vital Sign    Worried About Running Out of Food in the Last Year: Never true    Ran Out of Food in the Last Year: Never true  Transportation Needs: No  Transportation Needs (05/06/2023)   PRAPARE - Administrator, Civil Service (Medical): No    Lack of Transportation (Non-Medical): No  Physical Activity: Sufficiently Active (05/06/2023)   Exercise Vital Sign    Days of Exercise per Week: 6 days    Minutes of Exercise per Session: 120 min  Stress: No Stress Concern Present (05/06/2023)   [employer] of Occupational Health - Occupational Stress Questionnaire    Feeling of Stress : Not at all  Social Connections: Moderately Isolated (05/06/2023)   Social Connection and Isolation Panel [NHANES]    Frequency of Communication with Friends and Family: More than three  times a week    Frequency of Social Gatherings with Friends and Family: More than three times a week    Attends Religious Services: Never    Database administrator or Organizations: Yes    Attends Engineer, structural: More than 4 times per year    Marital Status: Divorced  Intimate Partner Violence: Not At Risk (05/06/2023)   Humiliation, Afraid, Rape, and Kick questionnaire    Fear of Current or Ex-Partner: No    Emotionally Abused: No    Physically Abused: No    Sexually Abused: No    Physical Exam      Future Appointments  Date Time Provider Department Center  07/15/2023 10:00 AM Dorthula Nettles, DO [facility] None  07/20/2023 10:30 AM Little, Karma Lew, RN [facility] None  08/04/2023 11:00 AM Noemi Chapel, NP [facility] None  08/10/2023  8:45 AM Danelle Earthly, MD [facility]  08/10/2023  9:15 AM [facility] NURSE [facility]  09/27/2023 10:40 AM [name], FNP [facility] None  05/10/2024 11:00 AM [facility]-ANNUAL WELLNESS VISIT [facility] None

## 2023-07-14 ENCOUNTER — Telehealth (HOSPITAL_COMMUNITY): Payer: Self-pay | Admitting: Emergency Medicine

## 2023-07-14 NOTE — Progress Notes (Signed)
ADVANCED HEART FAILURE CLINIC NOTE  Referring Physician: [name], FNP  Primary Care: [name], FNP Primary Cardiologist: Armanda Magic  HPI: Mark [name] is a 62 y.o. male w/ HFpEF, severe OSA, recently diagnosed HIV on Biktarvy (followed by Dr. Thedore Mins) admitted most recently in December 2023 for acute hypoxic respiratory failure believed to be secondary to acute on chronic HFpEF exacerbation. During that admission he had a TTE w/ LVEF 50-55% and severe asymmetric septal hypertrophy but no dynamic LVOT gradient. He was diuresed with IV lasix, started on GDMT and discharged home. Since that time he has been seen in [facility] at where jardiance/losartan were continued and coreg was increased to 12.5mg  BID. He has also followed with pulmonology in the interim. Since discharge in 12/23 he has been on chronic 2L home O2 with some improvement in exercise capacity. He had PFTs in 2019 w/o any obstructive disease.   Interval hx:  - He has made signifcant improvement in functional class since his last appointment. He is going to the [facility] everyday now where he cycles and lifts weights. He is also very slowly losing weight.   Activity level/exercise tolerance: Improved, NYHA IIb Orthopnea:  Sleeps on 2-3 pillows Paroxysmal noctural dyspnea:  no Chest pain/pressure:  no Orthostatic lightheadedness:  no Palpitations:  no Lower extremity edema: No edema today, resolved. Presyncope/syncope:  no Cough:  no  Past Medical History:  Diagnosis Date   Abnormal lung function test    a. Reported to possibly be COPD, but per pulm note: "Possible COPD - PFT was more suggestive of restrictive defect and diffusion defect likely from obesity and CHF"   CHF (congestive heart failure) (HCC)    Chronic diastolic heart failure (HCC) 05/07/2018   CKD (chronic kidney disease), stage III (HCC)    Diabetes mellitus type 2 in obese    Elevated troponin 05/07/2018   Hearing impaired    Hyperlipidemia     Hypertension    LBBB (left bundle branch block)    LVH (left ventricular hypertrophy)    Mild aortic stenosis    Mild pulmonary hypertension (HCC)    Morbid obesity (HCC)    Pulmonary hypertension (HCC)    Sleep apnea     Current Outpatient Medications  Medication Sig Dispense Refill   allopurinol (ZYLOPRIM) 100 MG tablet Take 0.5 tablets (50 mg total) by mouth daily. Take 50 mg by mouth every other day for gout prevention 30 tablet 2   amLODipine (NORVASC) 5 MG tablet Take 1 tablet (5 mg total) by mouth daily. 90 tablet 3   atorvastatin (LIPITOR) 10 MG tablet Take 1 tablet (10 mg total) by mouth daily. (Patient not taking: Reported on 06/15/2023) 30 tablet 3   atorvastatin (LIPITOR) 20 MG tablet TAKE 1 TABLET BY MOUTH EVERY DAY 90 tablet 1   bictegravir-emtricitabine-tenofovir AF (BIKTARVY) 50-200-25 MG TABS tablet TAKE 1 TABLET BY MOUTH DAILY. TRY TO TAKE AT THE SAME TIME EACH DAY WITH OR WITHOUT FOOD. 30 tablet 5   carvedilol (COREG) 6.25 MG tablet Take 1 tablet (6.25 mg total) by mouth 2 (two) times daily. 180 tablet 1   JARDIANCE 10 MG TABS tablet TAKE 1 TABLET BY MOUTH EVERY DAY 30 tablet 3   losartan (COZAAR) 100 MG tablet TAKE 1 TABLET BY MOUTH EVERY DAY 90 tablet 1   metFORMIN (GLUCOPHAGE) 1000 MG tablet TAKE 1 TABLET (1,000 MG TOTAL) BY MOUTH TWICE A DAY WITH FOOD 180 tablet 1   spironolactone (ALDACTONE) 25 MG tablet  Take 0.5 tablets (12.5 mg total) by mouth daily. 45 tablet 3   torsemide (DEMADEX) 20 MG tablet TAKE 2 TABLETS (40 MG TOTAL) BY MOUTH DAILY. 180 tablet 1   No current facility-administered medications for this visit.    No Known Allergies    Social History   Socioeconomic History   Marital status: Divorced    Spouse name: Not on file   Number of children: 2   Years of education: Not on file   Highest education level: Not on file  Occupational History   Occupation: self-employed    Comment: recieves disability  Tobacco Use   Smoking status: Former     Current packs/day: 0.00    Types: Cigarettes    Start date: 1998    Quit date: 1999    Years since quitting: 25.8   Smokeless tobacco: Never   Tobacco comments:    1 pack would last one month--08/01/18  Vaping Use   Vaping status: Never Used  Substance and Sexual Activity   Alcohol use: No   Drug use: No   Sexual activity: Not Currently    Partners: Female    Comment: declied condom  Other Topics Concern   Not on file  Social History Narrative   Not on file   Social Determinants of Health   Financial Resource Strain: Low Risk  (05/06/2023)   Overall Financial Resource Strain (CARDIA)    Difficulty of Paying Living Expenses: Not hard at all  Food Insecurity: No Food Insecurity (05/06/2023)   Hunger Vital Sign    Worried About Running Out of Food in the Last Year: Never true    Ran Out of Food in the Last Year: Never true  Transportation Needs: No Transportation Needs (05/06/2023)   PRAPARE - Administrator, Civil Service (Medical): No    Lack of Transportation (Non-Medical): No  Physical Activity: Sufficiently Active (05/06/2023)   Exercise Vital Sign    Days of Exercise per Week: 6 days    Minutes of Exercise per Session: 120 min  Stress: No Stress Concern Present (05/06/2023)   [employer] of Occupational Health - Occupational Stress Questionnaire    Feeling of Stress : Not at all  Social Connections: Moderately Isolated (05/06/2023)   Social Connection and Isolation Panel [NHANES]    Frequency of Communication with Friends and Family: More than three times a week    Frequency of Social Gatherings with Friends and Family: More than three times a week    Attends Religious Services: Never    Database administrator or Organizations: Yes    Attends Engineer, structural: More than 4 times per year    Marital Status: Divorced  Intimate Partner Violence: Not At Risk (05/06/2023)   Humiliation, Afraid, Rape, and Kick questionnaire    Fear of Current or  Ex-Partner: No    Emotionally Abused: No    Physically Abused: No    Sexually Abused: No      Family History  Problem Relation Age of Onset   Hypertension Mother    Hypertension Father     PHYSICAL EXAM: There were no vitals filed for this visit. GENERAL: Well nourished, well developed, and in no apparent distress at rest.  HEENT: Negative for arcus senilis or xanthelasma. There is no scleral icterus.  The mucous membranes are pink and moist.   NECK: Supple, No masses. Normal carotid upstrokes without bruits. No masses or thyromegaly.    CHEST: There are no chest  wall deformities. There is no chest wall tenderness. Respirations are unlabored.  Lungs- *** CARDIAC:  JVP: *** cm          Normal rate with regular rhythm. No murmurs, rubs or gallops.  Pulses are 2+ and symmetrical in upper and lower extremities. *** edema.  ABDOMEN: Soft, non-tender, non-distended. There are no masses or hepatomegaly. There are normal bowel sounds.  EXTREMITIES: Warm and well perfused with no cyanosis, clubbing.  LYMPHATIC: No axillary or supraclavicular lymphadenopathy.  NEUROLOGIC: Patient is oriented x3 with no focal or lateralizing neurologic deficits.  PSYCH: Patients affect is appropriate, there is no evidence of anxiety or depression.  SKIN: Warm and dry; no lesions or wounds.      DATA REVIEW  ECG: 08/25/22: NSR with wide LBBB, QRSd of  ECHO: LVEF 55%, severe asymmetric LVH.   PYP:  12/28/22: Grade II uptake  CMR: 03/30/23:  Study not suggestive of cardiac amyloidosis.  In chart review, study is more consistent with hypertensive heart disease; this is favored over low risk variant hypertrophic non obstructive cardiomyopathy.  Evidence of LBBB.  LVEF 47%.  Evidence of RV dilation and pulmonary hypertension.     Latest Ref Rng & Units 05/11/2018   10:13 AM  PFT Results  FVC-Pre L 2.46   FVC-Predicted Pre % 58   FVC-Post L 2.25   FVC-Predicted Post % 53   Pre FEV1/FVC %  % 79   Post FEV1/FCV % % 81   FEV1-Pre L 1.94   FEV1-Predicted Pre % 59   FEV1-Post L 1.84   DLCO uncorrected ml/min/mmHg 19.38   DLCO UNC% % 57   DLCO corrected ml/min/mmHg 19.67   DLCO COR %Predicted % 58   DLVA Predicted % 105   TLC L 5.03   TLC % Predicted % 69   RV % Predicted % 99     HEMODYNAMICS: RA:                  8 mmHg (mean) RV:                  34/8 mmHg PA:                  62/25 mmHg (37 mean) PCWP:            18 mmHg (mean)                                      Estimated Fick CO/CI   4.5 L/min, 2 L/min/m2                                                 TPG                 19  mmHg                                            PVR                 4.2 Wood Units  PAPi                4.6  IMPRESSION: 1.Mildly elevated pre and post capillary filling pressures 2.Moderately elevated PVR and PA mean consistent with 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  3.Moderately reduced cardiac index.   ASSESSMENT & PLAN:  Heart failure with preserved EF Etiology of WG:NFAOZH LVH on TTE; although, he has significant HTN, degree of LVH is concerning for infiltrative cardiomyopathy. CMR consistent with hypertensive heart disease. Myeloma panel previously negative; however, no FLC assay.  NYHA class / AHA Stage:IIB Volume status & Diuretics: Euvolemic on exam today Vasodilators:Losartan 100mg  daily; add amlodipine 5mg  daily.  Beta-Blocker: decrease coreg to 6.25mg  BID MRA: repeat lab work pending; will hold off for the time being.  Cardiometabolic:Jardiance 10mg  Devices therapies & Valvulopathies:CMR consistent with hypertensive heart disease Advanced therapies:Not indicated.   2. Pulmonary HTN - Likely group 3; on chronic O2; severe OHS/OSA. He will likely benefit mostly from O2 therapy. Will refer to pulmonology  - Will obtain serologic markers for PH work up today; I suspect they will be negative. Plan on V/Q scan. - Does not have a heavy smoking  history.   - CTA PE from 08/21/22 without interstitial lung disease.   3. Evaluation for Cardiac amyloid - PYP scan is grade 1 - Negative myeloma panel (no M spike etc) -At this time I do not think his PYP scan is positive.  LVH is likely secondary to uncontrolled hypertension.  CMR not consistent with amyloid.   4. HTN - start amlodipine 5mg  daily.  - losartan 100mg  daily  5. OSA - untreated, sleep study in 2015 showed severe OSA w/ AHI 41.5  - discussed association between untreated OSA and CV disease/ HTN - followed by Dr. Mayford Knife.    6. Obesity  - Body mass index is 35.93 kg/m. - wt loss advised    7. CKD IIIb  - b/l Scr previously  ~1.4 - repeat BMP today   8. HLD - LDL elevated, 110 mg/dL - on atorvastatin 20  - followed by cardiology    9. HIV - now followed by ID, Dr. Thedore Mins  - continue Biktarvy    Yamna Mackel Advanced Heart Failure Mechanical Circulatory Support

## 2023-07-14 NOTE — Telephone Encounter (Signed)
Text Mr. Fay to remind him of his clinic visit in the morning @ 10:00    Beatrix Shipper, Anne Ng [phone] 07/14/2023

## 2023-07-15 ENCOUNTER — Other Ambulatory Visit (HOSPITAL_COMMUNITY): Payer: Self-pay

## 2023-07-15 ENCOUNTER — Encounter (HOSPITAL_COMMUNITY): Payer: Self-pay | Admitting: Cardiology

## 2023-07-15 ENCOUNTER — Other Ambulatory Visit (HOSPITAL_COMMUNITY): Payer: Self-pay | Admitting: Emergency Medicine

## 2023-07-15 ENCOUNTER — Ambulatory Visit (HOSPITAL_COMMUNITY)
Admission: RE | Admit: 2023-07-15 | Discharge: 2023-07-15 | Disposition: A | Discharge status: Home / Self Care | Payer: Medicare HMO | Source: Ambulatory Visit | Attending: Cardiology | Admitting: Cardiology

## 2023-07-15 VITALS — BP 138/84 | HR 64 | Wt 242.3 lb

## 2023-07-15 DIAGNOSIS — I10 Essential (primary) hypertension: Secondary | ICD-10-CM

## 2023-07-15 DIAGNOSIS — Z6835 Body mass index (BMI) 35.0-35.9, adult: Secondary | ICD-10-CM | POA: Diagnosis not present

## 2023-07-15 DIAGNOSIS — Z79899 Other long term (current) drug therapy: Secondary | ICD-10-CM | POA: Insufficient documentation

## 2023-07-15 DIAGNOSIS — Z7984 Long term (current) use of oral hypoglycemic drugs: Secondary | ICD-10-CM | POA: Insufficient documentation

## 2023-07-15 DIAGNOSIS — Z9981 Dependence on supplemental oxygen: Secondary | ICD-10-CM | POA: Diagnosis not present

## 2023-07-15 DIAGNOSIS — J9611 Chronic respiratory failure with hypoxia: Secondary | ICD-10-CM

## 2023-07-15 DIAGNOSIS — E785 Hyperlipidemia, unspecified: Secondary | ICD-10-CM | POA: Diagnosis not present

## 2023-07-15 DIAGNOSIS — Z21 Asymptomatic human immunodeficiency virus [HIV] infection status: Secondary | ICD-10-CM | POA: Diagnosis not present

## 2023-07-15 DIAGNOSIS — G4733 Obstructive sleep apnea (adult) (pediatric): Secondary | ICD-10-CM | POA: Insufficient documentation

## 2023-07-15 DIAGNOSIS — Z87891 Personal history of nicotine dependence: Secondary | ICD-10-CM | POA: Insufficient documentation

## 2023-07-15 DIAGNOSIS — I130 Hypertensive heart and chronic kidney disease with heart failure and stage 1 through stage 4 chronic kidney disease, or unspecified chronic kidney disease: Secondary | ICD-10-CM | POA: Diagnosis not present

## 2023-07-15 DIAGNOSIS — E669 Obesity, unspecified: Secondary | ICD-10-CM | POA: Diagnosis not present

## 2023-07-15 DIAGNOSIS — I5032 Chronic diastolic (congestive) heart failure: Secondary | ICD-10-CM | POA: Insufficient documentation

## 2023-07-15 DIAGNOSIS — I272 Pulmonary hypertension, unspecified: Secondary | ICD-10-CM | POA: Insufficient documentation

## 2023-07-15 DIAGNOSIS — N1832 Chronic kidney disease, stage 3b: Secondary | ICD-10-CM | POA: Insufficient documentation

## 2023-07-15 DIAGNOSIS — E1122 Type 2 diabetes mellitus with diabetic chronic kidney disease: Secondary | ICD-10-CM | POA: Diagnosis not present

## 2023-07-15 LAB — BASIC METABOLIC PANEL
Anion gap: 6 (ref 5–15)
BUN: 29 mg/dL — ABNORMAL HIGH (ref 8–23)
CO2: 30 mmol/L (ref 22–32)
Calcium: 9.2 mg/dL (ref 8.9–10.3)
Chloride: 106 mmol/L (ref 98–111)
Creatinine, Ser: 1.9 mg/dL — ABNORMAL HIGH (ref 0.61–1.24)
GFR, Estimated: 39 mL/min — ABNORMAL LOW (ref >60)
Glucose, Bld: 77 mg/dL (ref 70–99)
Potassium: 4.5 mmol/L (ref 3.5–5.1)
Sodium: 142 mmol/L (ref 135–145)

## 2023-07-15 LAB — LIPID PANEL
Cholesterol: 187 mg/dL (ref 0–200)
HDL: 46 mg/dL (ref >40)
LDL Cholesterol: 115 mg/dL — ABNORMAL HIGH (ref 0–99)
Total CHOL/HDL Ratio: 4.1 {ratio}
Triglycerides: 129 mg/dL (ref <150)
VLDL: 26 mg/dL (ref 0–40)

## 2023-07-15 LAB — BRAIN NATRIURETIC PEPTIDE: B Natriuretic Peptide: 101.6 pg/mL — ABNORMAL HIGH (ref 0.0–100.0)

## 2023-07-15 MED ORDER — AMLODIPINE BESYLATE 10 MG PO TABS: 5.0000 mg | ORAL_TABLET | Freq: Every day | ORAL | 2 refills | Status: DC

## 2023-07-15 MED ORDER — CARVEDILOL 3.125 MG PO TABS: 6.2500 mg | ORAL_TABLET | Freq: Two times a day (BID) | ORAL | 2 refills | Status: DC

## 2023-07-15 MED ORDER — AMLODIPINE BESYLATE 10 MG PO TABS: 10.0000 mg | ORAL_TABLET | Freq: Every day | ORAL | 2 refills | Status: DC

## 2023-07-15 MED ORDER — FINERENONE 10 MG PO TABS: 10.0000 mg | ORAL_TABLET | Freq: Every day | ORAL | 1 refills | Status: DC

## 2023-07-15 NOTE — Progress Notes (Signed)
Paramedicine Encounter   Patient ID: Mark [name] , male,   DOB: Jul 14, 1961,62 y.o.,  MRN: [id number]   Met patient in clinic today with provider.   [id number]@ clinic- 242lbs B/P-138/84 P-  [id number]   Med changes today:  decreased Carvedilol to 3.125mg . BID and increased Amlodopine to 10mg  daily. Dr. Gasper Lloyd also discussed adding Finerenone.   Beatrix Shipper, EMT-Paramedic [phone] 07/15/2023

## 2023-07-15 NOTE — Patient Instructions (Addendum)
Medication Changes:   DECREASE CARVEDILOL 3.125MG  BY MOUTH TWICE DAILY   INCREASE AMLODIPINE 10MG  BY MOUTH DAILY    START Finerone10 mg daily  Lab Work:Labs done today, your results will be available in MyChart, we will contact you for abnormal readings.   Testing/Procedures:  High resolution CT you will be called to schedule  Follow-Up in: 2 MONTHS WITH DR. Carlyle Basques 2025 call in December to schedule an appointmentJanuary     At the Advanced [facility], you and your health needs are our priority. We have a designated team specialized in the treatment of Heart Failure. This Care Team includes your primary Heart Failure Specialized Cardiologist (physician), Advanced Practice Providers (APPs- Physician Assistants and Nurse Practitioners), and Pharmacist who all work together to provide you with the care you need, when you need it.   You may see any of the following providers on your designated Care Team at your next follow up:  Dr. Arvilla Meres Dr. Marca Ancona Dr. Dorthula Nettles Dr. Theresia Bough Tonye Becket, NP Robbie Lis, [place] [facility] [place], [place] Brynda Peon, NP [place] Lee, NP Karle Plumber, PharmD   Please be sure to bring in all your medications bottles to every appointment.   Need to Contact us:  If you have any questions or concerns before your next appointment please send [place] a message through [place] or call our office at [phone].    TO LEAVE A MESSAGE FOR THE NURSE SELECT OPTION 2, PLEASE LEAVE A MESSAGE INCLUDING: YOUR NAME DATE OF BIRTH CALL BACK NUMBER REASON FOR CALL**this is important as we prioritize the call backs  YOU WILL RECEIVE A CALL BACK THE SAME DAY AS LONG AS YOU CALL BEFORE 4:00 PM

## 2023-07-15 NOTE — Addendum Note (Signed)
Encounter addended by: Suezanne Cheshire, RN on: 07/15/2023 2:28 PM  Actions taken: Clinical Note Signed, Pharmacy for encounter modified, Order list changed

## 2023-07-16 ENCOUNTER — Other Ambulatory Visit (HOSPITAL_COMMUNITY): Payer: Self-pay

## 2023-07-16 ENCOUNTER — Telehealth (HOSPITAL_COMMUNITY): Payer: Self-pay | Admitting: Cardiology

## 2023-07-16 MED ORDER — ATORVASTATIN CALCIUM 40 MG PO TABS: 40.0000 mg | ORAL_TABLET | Freq: Every day | ORAL | 11 refills | Status: DC

## 2023-07-16 NOTE — Telephone Encounter (Signed)
Patient called.  Patient aware.  

## 2023-07-16 NOTE — Telephone Encounter (Signed)
-----   Message from [facility] sent at 07/16/2023  1:57 PM EST ----- Lets increase lipitor to 40mg  daily.   Adi

## 2023-07-20 ENCOUNTER — Ambulatory Visit: Payer: Self-pay

## 2023-07-21 ENCOUNTER — Telehealth: Payer: Self-pay | Admitting: *Deleted

## 2023-07-21 NOTE — Patient Instructions (Signed)
Visit Information  Thank you for taking time to visit with me today. Please don't hesitate to contact me if I can be of assistance to you.   Following are the goals we discussed today:   Goals Addressed               This Visit's Progress     Patient Stated     To manage heart failure (pt-stated)   On track     Care Coordination Interventions: Reviewed Heart Failure Action Plan in depth and provided written copy Advised patient to weigh each morning after emptying bladder Discussed importance of daily weight and advised patient to weigh and record daily Reviewed role of diuretics in prevention of fluid overload and management of heart failure; Discussed the importance of keeping all appointments with provider Patient will continue to weight daily as directed and record his weights Patient will notify his heart failure team of new symptoms or concerns per his heart failure action plan Patient will continue to follow a low sodium diet and balance his activity with rest Patient will continue to work with nurse care coordinator for chronic disease management and care coordination         Other     To complete a sleep study to diagnose obstructive sleep apnea   On track     Care Coordination Interventions: Evaluation of current treatment plan related to OSA and patient's adherence to plan as established by provider Determined patient completed his sleep study, he was diagnosed with severe sleep apnea Educated patient about basic disease process related to OSA, including treatment recommendations and potential complications if left untreated  Discussed with patient he was unable to afford his CPAP machine, confirmed the Rx was sent to [facility] Collaborated with Micheline Maze NP and Anette Riedel Turci CMA w/[facility] and received the following update "I have talked to [facility] regarding patient he has not received the CPAP he couldn't afford it at this time and patient has been  contacted multiple times after CPAP has been placed . Patient asked [facility] to void out the order . Patient does have oxygen at home with tanks and a concentrator." Placed outbound call to [facility], determined patient instructed [facility] to close out his order, rep confirmed patient's out of pocket is $84.12, patient will also pay $7.95 monthly for 12 months Determined the order will need to be reprocessed due to being past 90 days, and new clinical notes are needed by the ordering physician          Our next appointment is by telephone on 07/28/23 at 09:30 AM  Please call the care guide team at [phone] if you need to cancel or reschedule your appointment.   If you are experiencing a Mental Health or Behavioral Health Crisis or need someone to talk to, please call 1-800-273-TALK (toll free, 24 hour hotline)  The patient verbalized understanding of instructions, educational materials, and care plan provided today and DECLINED offer to receive copy of patient instructions, educational materials, and care plan.   Delsa Sale RN BSN CCM [place]  [facility] Nurse Care Coordinator  Direct Dial: [phone] Website: [email or web address]

## 2023-07-21 NOTE — Patient Outreach (Signed)
Care Coordination   Follow Up Visit Note   07/20/2023 Name: Mark [name] MRN: [id number] DOB: 02-02-1961  Mark [name] is a 62 y.o. year old male who sees [name], FNP for primary care. I spoke with  Mark [name] by phone today.  What matters to the patients health and wellness today?  Patient would like to keep his CHF under good control. He would like assistance with obtaining his CPAP machine.    Goals Addressed               This Visit's Progress     Patient Stated     To manage heart failure (pt-stated)   On track     Care Coordination Interventions: Reviewed Heart Failure Action Plan in depth and provided written copy Advised patient to weigh each morning after emptying bladder Discussed importance of daily weight and advised patient to weigh and record daily Reviewed role of diuretics in prevention of fluid overload and management of heart failure; Discussed the importance of keeping all appointments with provider Patient will continue to weight daily as directed and record his weights Patient will notify his heart failure team of new symptoms or concerns per his heart failure action plan Patient will continue to follow a low sodium diet and balance his activity with rest Patient will continue to work with nurse care coordinator for chronic disease management and care coordination         Other     To complete a sleep study to diagnose obstructive sleep apnea   On track     Care Coordination Interventions: Evaluation of current treatment plan related to OSA and patient's adherence to plan as established by provider Determined patient completed his sleep study, he was diagnosed with severe sleep apnea Educated patient about basic disease process related to OSA, including treatment recommendations and potential complications if left untreated  Discussed with patient he was unable to afford his CPAP machine, confirmed the Rx was sent to [facility] Collaborated with Micheline Maze NP and Anette Riedel Turci CMA w/[facility] and received the following update "I have talked to [name] regarding patient he has not received the CPAP he couldn't afford it at this time and patient has been contacted multiple times after CPAP has been placed . Patient asked [facility] to void out the order . Patient does have oxygen at home with tanks and a concentrator." Placed outbound call to [facility], determined patient instructed [facility] to close out his order, rep confirmed patient's out of pocket is $84.12, patient will also pay $7.95 monthly for 12 months Determined the order will need to be reprocessed due to being past 90 days, and new clinical notes are needed by the ordering physician      Interventions Today    Flowsheet Row Most Recent Value  Chronic Disease   Chronic disease during today's visit Congestive Heart Failure (CHF), Other  [OSA]  General Interventions   General Interventions Discussed/Reviewed General Interventions Discussed, General Interventions Reviewed, Doctor Visits, Durable Medical Equipment (DME), Communication with  Doctor Visits Discussed/Reviewed Doctor Visits Reviewed, Doctor Visits Discussed, Specialist  Durable Medical Equipment (DME) Other  [CPAP]  Communication with RN  Magda Bernheim CMA w/[facility],  Micheline Maze NP, Anette Riedel Turci CMA,  [facility]]  Exercise Interventions   Exercise Discussed/Reviewed Physical Activity  Physical Activity Discussed/Reviewed Physical Activity Reviewed, Physical Activity Discussed, Gym  Education Interventions   Education Provided Provided Education  Provided Verbal Education  On Exercise, When to see the doctor, Other  [CPAP usage]         SDOH assessments and interventions completed:  No     Care Coordination Interventions:  Yes, provided   Follow up plan: Follow up call scheduled for 07/28/23 @09 :30 AM    Encounter Outcome:  Patient Visit  Completed

## 2023-07-22 ENCOUNTER — Other Ambulatory Visit (HOSPITAL_COMMUNITY): Payer: Self-pay | Admitting: Emergency Medicine

## 2023-07-22 ENCOUNTER — Telehealth (HOSPITAL_COMMUNITY): Payer: Self-pay | Admitting: Emergency Medicine

## 2023-07-22 NOTE — Telephone Encounter (Signed)
Spoke with Gunnar Fusi from the pre service department. Gunnar Fusi advised that [employer].has a out patient radiology cap of $300, however price of procedure was less.   Pt responsible for: $103.23.  Pt was comfortable with this price and elected to proceed with registration for procedure.   [place] Setting EMT-P Community Paramedic  [phone]

## 2023-07-22 NOTE — Progress Notes (Signed)
Paramedicine Encounter    Patient ID: Mark [name], male    DOB: 1961-05-22, 62 y.o.   MRN: [id number]   Complaints - Continued Financial concerns. No physical concerns   Assessment - Lung sounds clear, minor edema noted in right ankle, but pt reports is baseline for him due to past gout flare ups.  Compliance with meds -reports takes them daily   Pill box filled - pt takes out of bottles BID  Refills needed - none  Meds changes since last visit - increase amlodapine to 10 mg and increase atorvastatin to 40 mg. Checked to confirm bottles had correct dosage     Social changes - financial concerns about [place] [facility] [place] prescription costs.   (I called the pharmacy and they are unsure how his 90 day supply was comped, but pharmacy advised they would call patient back directly to update him on financial assistance options)   VISIT SUMMARY**  I met with Mark [name] in his home today. Pt reported that he had been doing well and denies any chest pain, shortness of breath, palpitations, or dizziness. Pt reported continued compliance as he exemplified how he takes his pills every morning and evening. I checked doses to ensure med changes had taken place and they had. Pt was notified of the nutritional opportunities at the clinic happening on Tuesday and sent flyer for reference. No pill box filled for pt as he takes them out of the bottles. Upcoming appointments reviewed. Dede will follow up with Mark [name] for future visits.  BP 130/80 Comment: right  Pulse 78   Resp 16   Wt 242 lb (109.8 kg)   SpO2 98%   BMI 36.80 kg/m  Weight yesterday-242 lbs Last visit weight-242 lbs  [place] Setting EMT-P Community Paramedic  [phone]     ACTION: Home visit completed     Patient Care Team: [name], FNP as PCP - General (General Practice) O'Neal, Ronnald Ramp, MD as PCP - Cardiology (Cardiology)  Patient Active Problem List   Diagnosis Date Noted   COVID-19 vaccine  administered 05/25/2023   Class 2 obesity due to excess calories with body mass index (BMI) of 37.0 to 37.9 in adult 05/25/2023   Chronic gout without tophus 03/31/2023   Hypertensive heart disease with chronic diastolic congestive heart failure (HCC) 01/12/2023   Pulmonary hypertension (HCC) 01/12/2023   Asymptomatic HIV infection, with no history of HIV-related illness (HCC) 01/12/2023   HFrEF (heart failure with reduced ejection fraction) (HCC) 10/16/2022   Chronic respiratory failure with hypoxia (HCC) 10/16/2022   OSA (obstructive sleep apnea) 08/21/2022   Thyroid nodule 01/15/2020   PAD (peripheral artery disease) (HCC) 05/28/2019   Type 2 diabetes mellitus with chronic kidney disease (HCC) 09/26/2018   Obesity (BMI 30-39.9) 09/26/2018   Chronic diastolic heart failure (HCC) 05/07/2018   Cardiomegaly    Encntr for general adult medical exam w/o abnormal findings 04/25/2018   Hearing loss 04/25/2018   LBBB (left bundle branch block) 04/25/2018   Hyperlipidemia 04/25/2018   Other long term (current) drug therapy 04/25/2018   Other specified disorders of pigmentation 04/25/2018   Personal history of noncompliance with medical treatment, presenting hazards to health 04/25/2018   Sensorineural hearing loss (SNHL) of left ear 06/14/2017   Hypertensive heart disease 06/05/2013   Pure hypercholesterolemia 06/05/2013   Vitamin D deficiency 06/05/2013   Corns and callosity 11/03/2010    Current Outpatient Medications:    allopurinol (ZYLOPRIM) 100 MG tablet, Take 0.5 tablets (50 mg total)  by mouth daily. Take 50 mg by mouth every other day for gout prevention, Disp: 30 tablet, Rfl: 2   amLODipine (NORVASC) 10 MG tablet, Take 1 tablet (10 mg total) by mouth daily., Disp: 30 tablet, Rfl: 2   atorvastatin (LIPITOR) 40 MG tablet, Take 1 tablet (40 mg total) by mouth daily., Disp: 30 tablet, Rfl: 11   bictegravir-emtricitabine-tenofovir AF (BIKTARVY) 50-200-25 MG TABS tablet, TAKE 1 TABLET BY  MOUTH DAILY. TRY TO TAKE AT THE SAME TIME EACH DAY WITH OR WITHOUT FOOD., Disp: 30 tablet, Rfl: 5   carvedilol (COREG) 3.125 MG tablet, Take 2 tablets (6.25 mg total) by mouth 2 (two) times daily., Disp: 60 tablet, Rfl: 2   Finerenone 10 MG TABS, Take 1 tablet (10 mg total) by mouth daily., Disp: 90 tablet, Rfl: 1   JARDIANCE 10 MG TABS tablet, TAKE 1 TABLET BY MOUTH EVERY DAY, Disp: 30 tablet, Rfl: 3   losartan (COZAAR) 100 MG tablet, TAKE 1 TABLET BY MOUTH EVERY DAY, Disp: 90 tablet, Rfl: 1   metFORMIN (GLUCOPHAGE) 1000 MG tablet, TAKE 1 TABLET (1,000 MG TOTAL) BY MOUTH TWICE A DAY WITH FOOD, Disp: 180 tablet, Rfl: 1   spironolactone (ALDACTONE) 25 MG tablet, Take 0.5 tablets (12.5 mg total) by mouth daily., Disp: 45 tablet, Rfl: 3   torsemide (DEMADEX) 20 MG tablet, TAKE 2 TABLETS (40 MG TOTAL) BY MOUTH DAILY., Disp: 180 tablet, Rfl: 1 No Known Allergies   Social History   Socioeconomic History   Marital status: Divorced    Spouse name: Not on file   Number of children: 2   Years of education: Not on file   Highest education level: Not on file  Occupational History   Occupation: self-employed    Comment: recieves disability  Tobacco Use   Smoking status: Former    Current packs/day: 0.00    Types: Cigarettes    Start date: 1998    Quit date: 1999    Years since quitting: 25.9   Smokeless tobacco: Never   Tobacco comments:    1 pack would last one month--08/01/18  Vaping Use   Vaping status: Never Used  Substance and Sexual Activity   Alcohol use: No   Drug use: No   Sexual activity: Not Currently    Partners: Female    Comment: declied condom  Other Topics Concern   Not on file  Social History Narrative   Not on file   Social Determinants of Health   Financial Resource Strain: Low Risk  (05/06/2023)   Overall Financial Resource Strain (CARDIA)    Difficulty of Paying Living Expenses: Not hard at all  Food Insecurity: No Food Insecurity (05/06/2023)   Hunger Vital Sign     Worried About Running Out of Food in the Last Year: Never true    Ran Out of Food in the Last Year: Never true  Transportation Needs: No Transportation Needs (05/06/2023)   PRAPARE - Administrator, Civil Service (Medical): No    Lack of Transportation (Non-Medical): No  Physical Activity: Sufficiently Active (05/06/2023)   Exercise Vital Sign    Days of Exercise per Week: 6 days    Minutes of Exercise per Session: 120 min  Stress: No Stress Concern Present (05/06/2023)   [employer] of Occupational Health - Occupational Stress Questionnaire    Feeling of Stress : Not at all  Social Connections: Moderately Isolated (05/06/2023)   Social Connection and Isolation Panel [NHANES]    Frequency of Communication  with Friends and Family: More than three times a week    Frequency of Social Gatherings with Friends and Family: More than three times a week    Attends Religious Services: Never    Database administrator or Organizations: Yes    Attends Engineer, structural: More than 4 times per year    Marital Status: Divorced  Intimate Partner Violence: Not At Risk (05/06/2023)   Humiliation, Afraid, Rape, and Kick questionnaire    Fear of Current or Ex-Partner: No    Emotionally Abused: No    Physically Abused: No    Sexually Abused: No    Physical Exam      Future Appointments  Date Time Provider Department Center  07/28/2023  9:30 AM Riley Churches, RN THN-CCC None  07/28/2023 12:00 PM MC-CT 1 MC-CT [facility]  08/04/2023 11:00 AM Noemi Chapel, NP LBPU-PULCARE None  08/10/2023  8:45 AM Danelle Earthly, MD [facility]  08/10/2023  9:15 AM [facility] NURSE [facility]  09/27/2023 10:40 AM [name], FNP TIMA-[facility]A None  09/29/2023 10:30 AM Clarene Duke, Karma Lew, RN THN-CCC None  05/10/2024 11:00 AM TIMA-ANNUAL WELLNESS VISIT [facility]A-[facility]A None

## 2023-07-23 ENCOUNTER — Other Ambulatory Visit (HOSPITAL_COMMUNITY): Payer: Self-pay | Admitting: Cardiology

## 2023-07-23 DIAGNOSIS — I5032 Chronic diastolic (congestive) heart failure: Secondary | ICD-10-CM

## 2023-07-26 ENCOUNTER — Other Ambulatory Visit (HOSPITAL_COMMUNITY): Payer: Self-pay | Admitting: Emergency Medicine

## 2023-07-26 NOTE — Progress Notes (Unsigned)
Paramedicine Encounter    Patient ID: Mark [name], male    DOB: 19-Jan-1961, 62 y.o.   MRN: [id number]   Complaints NONE  Assessment A&O x 4, skin W&D w/ good color.  Denies chest pain or SOB.  Lung sounds clear and equal bilat w/ no peripheral edema noted.  Compliance with meds yes   Pill box filled N/A  Refills needed NONE  Meds changes since last visit decreased  Carvedilol to 3.125, Amlodopine 10mg .  Mark [name] 10mg  Will work on financial assistance w/ Mark [name]  Social changes NONE   BP 120/80 (BP Location: Left Arm, Patient Position: Sitting, Cuff Size: Normal)   Pulse 73   Resp 16   Wt 242 lb (109.8 kg)   SpO2 98%   BMI 36.80 kg/m  Weight yesterday- NONE Last visit weight- 242lb  Mark [name] continues to do a great job with participating his care plan compliance.  He is familiar w/ all meds and is complaint with same.  I reviewed recent me changes and pt demonstrated strong grasp of dosages. He has begun taking Finernone Mark [name] 10mg ).  Will assist pt in applying for financial assistance for same. Patient also inquired about a quantity of his recent script of Carvedilol 3.125 and only got a quantity of 42.  I will reach out to pharmacy to get answers.  ACTION: Home visit completed  Mark [name] [phone] 07/26/23  Patient Care Team: Dorothyann Peng, MD as PCP - General (Internal Medicine) O'Neal, Ronnald Ramp, MD as PCP - Cardiology (Cardiology)  Patient Active Problem List   Diagnosis Date Noted   COVID-19 vaccine administered 05/25/2023   Class 2 obesity due to excess calories with body mass index (BMI) of 37.0 to 37.9 in adult 05/25/2023   Chronic gout without tophus 03/31/2023   Hypertensive heart disease with chronic diastolic congestive heart failure (HCC) 01/12/2023   Pulmonary hypertension (HCC) 01/12/2023   Asymptomatic HIV infection, with no history of HIV-related illness (HCC) 01/12/2023   HFrEF (heart failure with reduced  ejection fraction) (HCC) 10/16/2022   Chronic respiratory failure with hypoxia (HCC) 10/16/2022   OSA (obstructive sleep apnea) 08/21/2022   Thyroid nodule 01/15/2020   PAD (peripheral artery disease) (HCC) 05/28/2019   Type 2 diabetes mellitus with chronic kidney disease (HCC) 09/26/2018   Obesity (BMI 30-39.9) 09/26/2018   Chronic diastolic heart failure (HCC) 05/07/2018   Cardiomegaly    Encntr for general adult medical exam w/o abnormal findings 04/25/2018   Hearing loss 04/25/2018   LBBB (left bundle branch block) 04/25/2018   Hyperlipidemia 04/25/2018   Other long term (current) drug therapy 04/25/2018   Other specified disorders of pigmentation 04/25/2018   Personal history of noncompliance with medical treatment, presenting hazards to health 04/25/2018   Sensorineural hearing loss (SNHL) of left ear 06/14/2017   Hypertensive heart disease 06/05/2013   Pure hypercholesterolemia 06/05/2013   Vitamin D deficiency 06/05/2013   Corns and callosity 11/03/2010    Current Outpatient Medications:    allopurinol (ZYLOPRIM) 100 MG tablet, Take 0.5 tablets (50 mg total) by mouth daily. Take 50 mg by mouth every other day for gout prevention, Disp: 30 tablet, Rfl: 2   amLODipine (NORVASC) 10 MG tablet, Take 1 tablet (10 mg total) by mouth daily., Disp: 30 tablet, Rfl: 2   atorvastatin (LIPITOR) 40 MG tablet, Take 1 tablet (40 mg total) by mouth daily., Disp: 30 tablet, Rfl: 11   bictegravir-emtricitabine-tenofovir AF (BIKTARVY) 50-200-25 MG TABS tablet, TAKE 1 TABLET BY MOUTH DAILY. TRY  TO TAKE AT THE SAME TIME EACH DAY WITH OR WITHOUT FOOD., Disp: 30 tablet, Rfl: 5   carvedilol (COREG) 3.125 MG tablet, Take 2 tablets (6.25 mg total) by mouth 2 (two) times daily., Disp: 60 tablet, Rfl: 2   Finerenone 10 MG TABS, Take 1 tablet (10 mg total) by mouth daily., Disp: 90 tablet, Rfl: 1   JARDIANCE 10 MG TABS tablet, TAKE 1 TABLET BY MOUTH EVERY DAY, Disp: 30 tablet, Rfl: 3   losartan (COZAAR) 100  MG tablet, TAKE 1 TABLET BY MOUTH EVERY DAY, Disp: 90 tablet, Rfl: 1   metFORMIN (GLUCOPHAGE) 1000 MG tablet, TAKE 1 TABLET (1,000 MG TOTAL) BY MOUTH TWICE A DAY WITH FOOD, Disp: 180 tablet, Rfl: 1   spironolactone (ALDACTONE) 25 MG tablet, Take 0.5 tablets (12.5 mg total) by mouth daily., Disp: 45 tablet, Rfl: 3   torsemide (DEMADEX) 20 MG tablet, TAKE 2 TABLETS (40 MG TOTAL) BY MOUTH DAILY., Disp: 180 tablet, Rfl: 1 No Known Allergies   Social History   Socioeconomic History   Marital status: Divorced    Spouse name: Not on file   Number of children: 2   Years of education: Not on file   Highest education level: Not on file  Occupational History   Occupation: self-employed    Comment: recieves disability  Tobacco Use   Smoking status: Former    Current packs/day: 0.00    Types: Cigarettes    Start date: 1998    Quit date: 1999    Years since quitting: 25.9   Smokeless tobacco: Never   Tobacco comments:    1 pack would last one month--08/01/18  Vaping Use   Vaping status: Never Used  Substance and Sexual Activity   Alcohol use: No   Drug use: No   Sexual activity: Not Currently    Partners: Female    Comment: declied condom  Other Topics Concern   Not on file  Social History Narrative   Not on file   Social Determinants of Health   Financial Resource Strain: Low Risk  (05/06/2023)   Overall Financial Resource Strain (CARDIA)    Difficulty of Paying Living Expenses: Not hard at all  Food Insecurity: No Food Insecurity (05/06/2023)   Hunger Vital Sign    Worried About Running Out of Food in the Last Year: Never true    Ran Out of Food in the Last Year: Never true  Transportation Needs: No Transportation Needs (05/06/2023)   PRAPARE - Administrator, Civil Service (Medical): No    Lack of Transportation (Non-Medical): No  Physical Activity: Sufficiently Active (05/06/2023)   Exercise Vital Sign    Days of Exercise per Week: 6 days    Minutes of Exercise per  Session: 120 min  Stress: No Stress Concern Present (05/06/2023)   [employer] of Occupational Health - Occupational Stress Questionnaire    Feeling of Stress : Not at all  Social Connections: Moderately Isolated (05/06/2023)   Social Connection and Isolation Panel [NHANES]    Frequency of Communication with Friends and Family: More than three times a week    Frequency of Social Gatherings with Friends and Family: More than three times a week    Attends Religious Services: Never    Database administrator or Organizations: Yes    Attends Engineer, structural: More than 4 times per year    Marital Status: Divorced  Intimate Partner Violence: Not At Risk (05/06/2023)   Humiliation, Afraid, Rape,  and Kick questionnaire    Fear of Current or Ex-Partner: No    Emotionally Abused: No    Physically Abused: No    Sexually Abused: No    Physical Exam      Future Appointments  Date Time Provider Department Center  07/28/2023  9:30 AM Riley Churches, RN [facility]N-CCC None  07/28/2023 12:00 PM MC-CT 1 MC[facility]CT [facility]  08/04/2023 11:00 AM Noemi Chapel, NP LBPU-PULCARE None  08/10/2023  8:45 AM Danelle Earthly, MD [facility]  08/10/2023  9:15 AM [facility] NURSE [facility]  09/27/2023 10:40 AM [name], FNP [facility] None  09/29/2023 10:30 AM Little, Karma Lew, RN THN-CCC None  05/10/2024 11:00 AM [facility]-ANNUAL WELLNESS VISIT [facility]A None

## 2023-07-28 ENCOUNTER — Ambulatory Visit: Payer: Self-pay

## 2023-07-28 ENCOUNTER — Ambulatory Visit (HOSPITAL_COMMUNITY)
Admission: RE | Admit: 2023-07-28 | Discharge: 2023-07-28 | Disposition: A | Discharge status: Home / Self Care | Payer: Medicare HMO | Source: Ambulatory Visit | Attending: Cardiology | Admitting: Cardiology

## 2023-07-28 DIAGNOSIS — I7121 Aneurysm of the ascending aorta, without rupture: Secondary | ICD-10-CM | POA: Diagnosis not present

## 2023-07-28 DIAGNOSIS — I5032 Chronic diastolic (congestive) heart failure: Secondary | ICD-10-CM | POA: Insufficient documentation

## 2023-07-28 DIAGNOSIS — I509 Heart failure, unspecified: Secondary | ICD-10-CM | POA: Diagnosis not present

## 2023-07-28 NOTE — Patient Outreach (Signed)
  Care Coordination   Follow Up Visit Note   07/28/2023 Name: Mark [name] MRN: [id number] DOB: October 02, 1960  Mark [name] is a 62 y.o. year old male who sees Dorothyann Peng, MD for primary care. I spoke with  Mark [name] by phone today.  What matters to the patients health and wellness today?  Patient will consider purchasing his CPAP machine for treatment of his OSA.     Goals Addressed             This Visit's Progress    To complete a sleep study to diagnose obstructive sleep apnea   On track    Care Coordination Interventions: Evaluation of current treatment plan related to OSA and patient's adherence to plan as established by provider Educated patient about his out of pocket for his CPAP machine per [facility]; Patient will need to pay an initial $84.12, after which patient will also pay $7.95 monthly for 12 months Informed patient the order will need to be reprocessed due to being past 90 days, and new clinical notes are needed by the ordering physician Discussed patient will consider and call this RN back if he wishes to proceed  Reviewed and discussed with patient his next scheduled follow up with nurse care coordinator is set for 09/29/23 @10 :30 AM following his PCP follow up scheduled for 09/27/23 @10 :40 AM Patient will consider the cost of his CPAP and notify this nurse care coordinator to assist with next steps Patient will keep all scheduled in person and telephonic follow up visits as scheduled Patient will continue to work with nurse care coordinator for chronic disease management and care coordination needs      Interventions Today    Flowsheet Row Most Recent Value  Chronic Disease   Chronic disease during today's visit Other  [OSA w/CPAP]  General Interventions   General Interventions Discussed/Reviewed General Interventions Discussed, General Interventions Reviewed, Durable Medical Equipment (DME)  Durable Medical Equipment (DME) Other  [CPAP]   Education Interventions   Education Provided Provided Education  Provided Verbal Education On Other  [cost for CPAP rental/own]          SDOH assessments and interventions completed:  No     Care Coordination Interventions:  Yes, provided   Follow up plan: Follow up call scheduled for 09/29/23 @10 :30 AM    Encounter Outcome:  Patient Visit Completed

## 2023-07-28 NOTE — Patient Instructions (Signed)
Visit Information  Thank you for taking time to visit with me today. Please don't hesitate to contact me if I can be of assistance to you.   Following are the goals we discussed today:   Goals Addressed             This Visit's Progress    To complete a sleep study to diagnose obstructive sleep apnea   On track    Care Coordination Interventions: Evaluation of current treatment plan related to OSA and patient's adherence to plan as established by provider Educated patient about his out of pocket for his CPAP machine per [facility]; Patient will need to pay an initial $84.12, after which patient will also pay $7.95 monthly for 12 months Informed patient the order will need to be reprocessed due to being past 90 days, and new clinical notes are needed by the ordering physician Discussed patient will consider and call this RN back if he wishes to proceed  Reviewed and discussed with patient his next scheduled follow up with nurse care coordinator is set for 09/29/23 @10 :30 AM following his PCP follow up scheduled for 09/27/23 @10 :40 AM Patient will consider the cost of his CPAP and notify this nurse care coordinator to assist with next steps Patient will keep all scheduled in person and telephonic follow up visits as scheduled Patient will continue to work with nurse care coordinator for chronic disease management and care coordination needs          Our next appointment is by telephone on 09/29/23 at 10:30 AM  Please call the care guide team at [phone] if you need to cancel or reschedule your appointment.   If you are experiencing a Mental Health or Behavioral Health Crisis or need someone to talk to, please call 1-800-273-TALK (toll free, 24 hour hotline)  The patient verbalized understanding of instructions, educational materials, and care plan provided today and DECLINED offer to receive copy of patient instructions, educational materials, and care plan.   Delsa Sale RN  BSN CCM [place]  [facility], [facility] Nurse Care Coordinator  Direct Dial: [phone] Website: [email or web address]

## 2023-08-04 ENCOUNTER — Ambulatory Visit: Payer: Medicare HMO | Admitting: Nurse Practitioner

## 2023-08-04 ENCOUNTER — Encounter: Payer: Self-pay | Admitting: Nurse Practitioner

## 2023-08-04 VITALS — BP 130/88 | HR 69 | Ht 71.0 in | Wt 243.0 lb

## 2023-08-04 DIAGNOSIS — G4733 Obstructive sleep apnea (adult) (pediatric): Secondary | ICD-10-CM

## 2023-08-04 DIAGNOSIS — J9611 Chronic respiratory failure with hypoxia: Secondary | ICD-10-CM

## 2023-08-04 DIAGNOSIS — I272 Pulmonary hypertension, unspecified: Secondary | ICD-10-CM

## 2023-08-04 DIAGNOSIS — I5020 Unspecified systolic (congestive) heart failure: Secondary | ICD-10-CM | POA: Diagnosis not present

## 2023-08-04 NOTE — Assessment & Plan Note (Signed)
Likely multifactorial related to CHF and untreated severe OSA. Euvolemic on exam. Follow up with cardiology as scheduled.

## 2023-08-04 NOTE — Assessment & Plan Note (Signed)
Severe OSA. He was supposed to start on CPAP following in lab study May 2024. He was unfortunately unable to afford this. Reviewed risks of untreated severe OSA. Discussed correlation between his sleep apnea and his HF/PH. He is will to start CPAP after the first of the year. Will replace orders today for new start CPAP auto 10-16 cmH2O with 2 lpm supplemental O2 bled through. Educated on proper use/care of device. Safe driving practices reviewed. Healthy weight loss encouraged.  Patient Instructions  You need to start CPAP, once you can afford it. Call [facility] in the beginning of the year. I'll put a new order in for it as well   Start CPAP every night, minimum of 4-6 hours a night.  Change equipment as directed. Wash your tubing with warm soap and water daily, hang to dry. Wash humidifier portion weekly. Use bottled, distilled water and change daily Be aware of reduced alertness and do not drive or operate heavy machinery if experiencing this or drowsiness.  Exercise encouraged, as tolerated. Healthy weight management discussed.  Avoid or decrease alcohol consumption and medications that make you more sleepy, if possible. Notify if persistent daytime sleepiness occurs even with consistent use of PAP therapy.   We discussed how untreated sleep apnea puts an individual at risk for cardiac arrhthymias, pulm HTN, DM, stroke and increases their risk for daytime accidents.    Use caution when driving and pull over if you become sleepy    Continue supplemental oxygen 2 lpm on POC with activity. Continue 2 lpm continuous at night with your home concentrator. Goal >88-90% Call the medical supply company to troubleshoot your machine   Keep up the good work on your exercise and weight loss    Follow up in 12 weeks with Dr. Wynona Neat. If symptoms worsen, please contact office for sooner follow up or seek emergency care.

## 2023-08-04 NOTE — Patient Instructions (Signed)
You need to start CPAP, once you can afford it. Call [facility] in the beginning of the year. I'll put a new order in for it as well   Start CPAP every night, minimum of 4-6 hours a night.  Change equipment as directed. Wash your tubing with warm soap and water daily, hang to dry. Wash humidifier portion weekly. Use bottled, distilled water and change daily Be aware of reduced alertness and do not drive or operate heavy machinery if experiencing this or drowsiness.  Exercise encouraged, as tolerated. Healthy weight management discussed.  Avoid or decrease alcohol consumption and medications that make you more sleepy, if possible. Notify if persistent daytime sleepiness occurs even with consistent use of PAP therapy.   We discussed how untreated sleep apnea puts an individual at risk for cardiac arrhthymias, pulm HTN, DM, stroke and increases their risk for daytime accidents.    Use caution when driving and pull over if you become sleepy    Continue supplemental oxygen 2 lpm on POC with activity. Continue 2 lpm continuous at night with your home concentrator. Goal >88-90% Call the medical supply company to troubleshoot your machine   Keep up the good work on your exercise and weight loss    Follow up in 12 weeks with Dr. Wynona Neat. If symptoms worsen, please contact office for sooner follow up or seek emergency care.

## 2023-08-04 NOTE — Assessment & Plan Note (Signed)
Stable without increased O2 requirement. He has desaturations to low to mid 80's on room air at home. Advised that goal is >88-90% and encouraged to continue utilizing.

## 2023-08-04 NOTE — Progress Notes (Signed)
@Patient  ID: Mark [name], male    DOB: [birth year], 62 y.o.   MRN: [id number]  Chief Complaint  Patient presents with   Follow-up    OSA/CPAP F/U    Referring provider: [name], FNP  HPI: 62 year old male, former smoker referred for sleep consult.  Past medical history significant for CHF, hypertension, LBBB, cardiomegaly, PAD, chronic hypoxic respiratory failure, DM 2, CKD stage III, HLD, vitamin D deficiency, obesity, OSA, HIV.  TEST/EVENTS:  10/25/2013 NPSG: AHI 41.5/h 05/11/2018 PFT: FVC 58, FEV1 59, ratio 81, TLC 69, DLCOcor 58.  No BD 08/22/2022 echocardiogram: EF 50 to 55%.  G1 DD.  LVH.  RV size and function is normal.  Mild MR. 12/31/2022 Split Night CPAP: AHI 75/h, SpO2 low 78% >> optimal pressure setting 16 cmH2O with 2 lpm supplemental O2 bled through   10/16/2022: OV with Tamesha Ellerbrock NP for sleep consult referred by [name], NP.  He has had more problems with his heart recently and was hospitalized in December 2023 for acute hypoxemic respiratory failure secondary to HFrEF and pulmonary hypertension.  He was encouraged to seek evaluation due to his history of severe OSA and not currently being treated on CPAP therapy.  He is also continued to require oxygen therapy since discharge.  Currently using 2 L/min supplemental O2 with portable tanks.  Has an oxygen concentrator at home.  Has not noticed any low levels at home.  He has been following with cardiology for management of his heart failure symptoms.  Feels like he has improved since he was admitted.  Activity tolerance is better; still gets short winded with longer distances and especially climbing.  Wants to do what ever he needs to to get better.  He does not have any pulmonary history.  He was a former remote smoker.  He had previous pulmonary function testing in 2019 without evidence of obstruction.  He did have a restrictive defect which was felt to be related to his weight and CHF.  He has never used any inhalers in the  past. He tells me today that he has been told that he snores and has had witnessed apneas in the past.  He actually does not feel as tired as he did back in 2015 when he had his original sleep study.  During that time, he would fall asleep just talking to people.  Now he feels like he can go throughout the day without falling asleep but his stamina is still not the best.  He is always attributed this to his heart.  He denies any morning headaches, drowsy driving, sleep parasomnia/paralysis.  No symptoms of narcolepsy or cataplexy.  He would be willing to use PAP therapy if that was what was required to help get him better.  Fluid status today is overall stable. He goes to sleep around 9:30 PM to 10 PM.  Falls asleep within 20 minutes.  Wakes around 3 times a night.  Usually sleeps on his side.  Officially gets up about 7 AM.  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  Used to work as a Paediatric nurse.  The only heavy machinery he operates is his car.  Weight has fluctuated over the last 2 years.  Last sleep study was in 2015 with severe sleep apnea.  Never started on CPAP therapy after this.  Does not take any sleep aids. He has had a history of high blood pressure, heart failure, diabetes.  No history of stroke or heart rhythm problems.  He quit smoking in 2019.  A pack usually lasted him about a month.  He also quit drinking alcohol in 2019.  No excessive caffeine intake or illicit drug use.  Lives alone.  No significant family history. [facility] 3  11/12/2022: OV with Dr. Wynona Neat. Severe OSA diagnosed 2015. Has not been on any therapy. Hx of HF. Found to have chronic respiratory failure, for which he is on supplemental O2. Hospitalized December 2023 for hypoxemic respiratory failure and PH. Discharged on 2 lpm. Trying to stay active. Reformed smoker. Wakes up tired but not too sleepy during the day. Split night study ordered for further evaluation.   08/04/2023: Today - follow up Patient presents today for  overdue follow up. He was supposed to start CPAP after he had a split night study that revealed severe OSA. Unfortunately, he was not able to afford this. He does think he will be able to come the beginning of the year. He feels unchanged compared to his last visit. He doesn't have much daytime fatigue. No issues with drowsy driving or sleep parasomnias/paralysis. He has not been having any issues with his breathing. [facility]2 [facility] and focusing on cardio with stationary bike. He denies any cough, chest congestion, wheezing, increased leg swelling, weight gain, orthopnea. He is still on oxygen 2 lpm. He did forget to switch over to his POC the other day and went out to his mailbox, stopped and talked to some friends and then was sitting for a while when he realized he didn't have his O2 on. He checked his O2 sat and it was 84%. He did think anything about 70% was good but he resumed wearing his oxygen after this. He still wears it but was curious if he'd ever be able to get off of it.   No Known Allergies  Immunization History  Administered Date(s) Administered   Hepb-cpg 09/28/2022, 02/09/2023   Influenza, Seasonal, Injecte, Preservative Fre 05/06/2023   Influenza,inj,Quad PF,6+ Mos 06/18/2021, 04/13/2022   Influenza-Unspecified 04/13/2022   PFIZER(Purple Top)SARS-COV-2 Vaccination 02/01/2020, 02/22/2020, 08/20/2020   PNEUMOCOCCAL CONJUGATE-20 08/24/2022   Pfizer Covid-19 Vaccine Bivalent Booster 8yrs & up 06/18/2021, 04/13/2022   Pfizer(Comirnaty)Fall Seasonal Vaccine 12 years and older 05/25/2023    Past Medical History:  Diagnosis Date   Abnormal lung function test    a. Reported to possibly be COPD, but per pulm note: "Possible COPD - PFT was more suggestive of restrictive defect and diffusion defect likely from obesity and CHF"   CHF (congestive heart failure) (HCC)    Chronic diastolic heart failure (HCC) 05/07/2018   CKD (chronic kidney  disease), stage III (HCC)    Diabetes mellitus type 2 in obese    Elevated troponin 05/07/2018   Hearing impaired    Hyperlipidemia    Hypertension    LBBB (left bundle branch block)    LVH (left ventricular hypertrophy)    Mild aortic stenosis    Mild pulmonary hypertension (HCC)    Morbid obesity (HCC)    Pulmonary hypertension (HCC)    Sleep apnea     Tobacco History: Social History   Tobacco Use  Smoking Status Former   Current packs/day: 0.00   Types: Cigarettes   Start date: 1998   Quit date: 1999   Years since quitting: 25.9  Smokeless Tobacco Never  Tobacco Comments   1 pack would last one month--08/01/18   Counseling given: Not Answered Tobacco comments: 1 pack would last one  month--08/01/18   Outpatient Medications Prior to Visit  Medication Sig Dispense Refill   allopurinol (ZYLOPRIM) 100 MG tablet Take 0.5 tablets (50 mg total) by mouth daily. Take 50 mg by mouth every other day for gout prevention 30 tablet 2   amLODipine (NORVASC) 10 MG tablet Take 1 tablet (10 mg total) by mouth daily. 30 tablet 2   atorvastatin (LIPITOR) 40 MG tablet Take 1 tablet (40 mg total) by mouth daily. 30 tablet 11   bictegravir-emtricitabine-tenofovir AF (BIKTARVY) 50-200-25 MG TABS tablet TAKE 1 TABLET BY MOUTH DAILY. TRY TO TAKE AT THE SAME TIME EACH DAY WITH OR WITHOUT FOOD. 30 tablet 5   carvedilol (COREG) 3.125 MG tablet Take 2 tablets (6.25 mg total) by mouth 2 (two) times daily. 360 tablet 3   Finerenone 10 MG TABS Take 1 tablet (10 mg total) by mouth daily. 90 tablet 1   JARDIANCE 10 MG TABS tablet TAKE 1 TABLET BY MOUTH EVERY DAY 30 tablet 3   losartan (COZAAR) 100 MG tablet TAKE 1 TABLET BY MOUTH EVERY DAY 90 tablet 1   metFORMIN (GLUCOPHAGE) 1000 MG tablet TAKE 1 TABLET (1,000 MG TOTAL) BY MOUTH TWICE A DAY WITH FOOD 180 tablet 1   spironolactone (ALDACTONE) 25 MG tablet Take 0.5 tablets (12.5 mg total) by mouth daily. 45 tablet 3   torsemide (DEMADEX) 20 MG tablet TAKE 2  TABLETS (40 MG TOTAL) BY MOUTH DAILY. 180 tablet 1   No facility-administered medications prior to visit.     Review of Systems:   Constitutional: No weight loss or gain, night sweats, fevers, chills, or lassitude. +occasional fatigue HEENT: No headaches, difficulty swallowing, tooth/dental problems, or sore throat. No sneezing, itching, ear ache, nasal congestion, or post nasal drip CV:  +swelling in lower extremities (occasional; stable). No chest pain, orthopnea, PND, anasarca, dizziness, palpitations, syncope Resp: +snoring; shortness of breath with exertion (minimal). No excess mucus or change in color of mucus. No productive or non-productive. No hemoptysis. No wheezing.  No chest wall deformity GI:  No heartburn, indigestion, abdominal pain, nausea, vomiting, diarrhea, change in bowel habits, loss of appetite, bloody stools.  GU: No dysuria, change in color of urine, urgency or frequency.  Skin: No rash, lesions, ulcerations MSK:  No joint pain or swelling.   No back pain. Neuro: No dizziness or lightheadedness.  Psych: No depression or anxiety. Mood stable.     Physical Exam:  BP 130/88 (BP Location: Right Arm, Cuff Size: Large)   Pulse 69   Ht 5\' 11"  (1.803 m)   Wt 243 lb (110.2 kg)   SpO2 96%   BMI 33.89 kg/m   GEN: Pleasant, interactive, chronically-ill appearing; obese; in no acute distress. HEENT:  Normocephalic and atraumatic. PERRLA. Sclera white. Nasal turbinates pink, moist and patent bilaterally. No rhinorrhea present. Oropharynx pink and moist, without exudate or edema. No lesions, ulcerations, or postnasal drip. Mallampati III NECK:  Supple w/ fair ROM. No JVD present. Normal carotid impulses w/o bruits. Thyroid symmetrical with no goiter or nodules palpated. No lymphadenopathy.   CV: RRR, no m/r/g, no peripheral edema. Pulses intact, +2 bilaterally. No cyanosis, pallor or clubbing. PULMONARY:  Unlabored, regular breathing. Clear bilaterally A&P w/o  wheezes/rales/rhonchi. No accessory muscle use.  GI: BS present and normoactive. Soft, non-tender to palpation. No organomegaly or masses detected. MSK: No erythema, warmth or tenderness. Cap refil <2 sec all extrem. No deformities or joint swelling noted.  Neuro: A/Ox3. No focal deficits noted.   Skin: Warm, no  lesions or rashe Psych: Normal affect and behavior. Judgement and thought content appropriate.     Lab Results:  CBC    Component Value Date/Time   WBC 5.5 11/09/2022 0853   RBC 4.27 11/09/2022 0853   HGB 12.6 (L) 11/09/2022 0853   HGB 15.1 03/07/2020 1202   HCT 38.6 11/09/2022 0853   HCT 44.4 03/07/2020 1202   PLT 183 11/09/2022 0853   PLT 202 03/07/2020 1202   MCV 90.4 11/09/2022 0853   MCV 92 03/07/2020 1202   MCH 29.5 11/09/2022 0853   MCHC 32.6 11/09/2022 0853   RDW 16.0 (H) 11/09/2022 0853   RDW 13.7 03/07/2020 1202   LYMPHSABS 1,375 11/09/2022 0853   MONOABS 0.6 09/12/2022 0259   EOSABS 143 11/09/2022 0853   BASOSABS 50 11/09/2022 0853    BMET    Component Value Date/Time   NA 142 07/15/2023 1028   NA 145 (H) 01/05/2022 1546   K 4.5 07/15/2023 1028   CL 106 07/15/2023 1028   CO2 30 07/15/2023 1028   GLUCOSE 77 07/15/2023 1028   BUN 29 (H) 07/15/2023 1028   BUN 14 01/05/2022 1546   CREATININE 1.90 (H) 07/15/2023 1028   CREATININE 2.00 (H) 11/09/2022 0853   CALCIUM 9.2 07/15/2023 1028   GFRNONAA 39 (L) 07/15/2023 1028   GFRAA 60 09/23/2020 1037    BNP    Component Value Date/Time   BNP 101.6 (H) 07/15/2023 1028     Imaging:  No results found.  Administration History     None          Latest Ref Rng & Units 12/09/2022    8:21 AM 05/11/2018   10:13 AM  PFT Results  FVC-Pre L 2.83  2.46   FVC-Predicted Pre % 58  58   FVC-Post L 2.72  2.25   FVC-Predicted Post % 56  53   Pre FEV1/FVC % % 78  79   Post FEV1/FCV % % 80  81   FEV1-Pre L 2.20  1.94   FEV1-Predicted Pre % 61  59   FEV1-Post L 2.19  1.84   DLCO uncorrected  ml/min/mmHg 14.51  19.38   DLCO UNC% % 52  57   DLCO corrected ml/min/mmHg  19.67   DLCO COR %Predicted %  58   DLVA Predicted % 78  105   TLC L 5.51  5.03   TLC % Predicted % 76  69   RV % Predicted % 107  99     No results found for: "NITRICOXIDE"      Assessment & Plan:   OSA (obstructive sleep apnea) Severe OSA. He was supposed to start on CPAP following in lab study May 2024. He was unfortunately unable to afford this. Reviewed risks of untreated severe OSA. Discussed correlation between his sleep apnea and his HF/PH. He is will to start CPAP after the first of the year. Will replace orders today for new start CPAP auto 10-16 cmH2O with 2 lpm supplemental O2 bled through. Educated on proper use/care of device. Safe driving practices reviewed. Healthy weight loss encouraged.  Patient Instructions  You need to start CPAP, once you can afford it. Call [employer] in the beginning of the year. I'll put a new order in for it as well   Start CPAP every night, minimum of 4-6 hours a night.  Change equipment as directed. Wash your tubing with warm soap and water daily, hang to dry. Wash humidifier portion weekly. Use bottled, distilled water and  change daily Be aware of reduced alertness and do not drive or operate heavy machinery if experiencing this or drowsiness.  Exercise encouraged, as tolerated. Healthy weight management discussed.  Avoid or decrease alcohol consumption and medications that make you more sleepy, if possible. Notify if persistent daytime sleepiness occurs even with consistent use of PAP therapy.   We discussed how untreated sleep apnea puts an individual at risk for cardiac arrhthymias, pulm HTN, DM, stroke and increases their risk for daytime accidents.    Use caution when driving and pull over if you become sleepy    Continue supplemental oxygen 2 lpm on POC with activity. Continue 2 lpm continuous at night with your home concentrator. Goal >88-90% Call the medical  supply company to troubleshoot your machine   Keep up the good work on your exercise and weight loss    Follow up in 12 weeks with Dr. Wynona Neat. If symptoms worsen, please contact office for sooner follow up or seek emergency care.    Chronic respiratory failure with hypoxia (HCC) Stable without increased O2 requirement. He has desaturations to low to mid 80's on room air at home. Advised that goal is >88-90% and encouraged to continue utilizing.   Pulmonary hypertension (HCC) Likely multifactorial related to CHF and untreated severe OSA. Euvolemic on exam. Follow up with cardiology as scheduled.     I spent 35 minutes of dedicated to the care of this patient on the date of this encounter to include pre-visit review of records, face-to-face time with the patient discussing conditions above, post visit ordering of testing, clinical documentation with the electronic health record, making appropriate referrals as documented, and communicating necessary findings to members of the patients care team.  Noemi Chapel, NP 08/04/2023  Pt aware and understands NP's role.

## 2023-08-06 ENCOUNTER — Ambulatory Visit: Payer: Self-pay

## 2023-08-06 ENCOUNTER — Other Ambulatory Visit (HOSPITAL_COMMUNITY): Payer: Self-pay | Admitting: Emergency Medicine

## 2023-08-06 NOTE — Patient Outreach (Signed)
  Care Coordination   Follow Up Visit Note   08/06/2023 Name: Mark [name] MRN: [id number] DOB: [birth year]  Mark [name] is a 62 y.o. year old male who sees Dorothyann Peng, MD for primary care. I spoke with  Mark [name] by phone today.  What matters to the patients health and wellness today?  Patient will purchase his CPAP machine after September 01, 2023.     Goals Addressed             This Visit's Progress    To complete a sleep study to diagnose obstructive sleep apnea   On track    Care Coordination Interventions: Received inbound call from patient  Evaluation of current treatment plan related to OSA and patient's adherence to plan as established by provider Discussed with patient he completed his f/u with Micheline Maze NP with Pulmonology regarding his OSA Discussed patient requested his order for a CPAP to be resent to [facility] and he will have the funds right after the first of the year to purchase the CPAP machine Re-Educated patient about his out of pocket for his CPAP machine per [facility]; Patient will need to pay an initial $84.12, after which patient will also pay $7.95 monthly for 12 months Instructed patient to let [facility] know once they call to confirm authorization has been processed for his CPAP, he will call in after January 1st to provide the payment necessary to obtain his equipment and patient verbalizes understanding Reviewed and discussed with patient his next scheduled follow up with nurse care coordinator is set for 09/29/23 @10 :30 AM following his PCP follow up scheduled for 09/27/23 @10 :40 AM Patient will obtain his CPAP machine after January 1st Patient will keep all scheduled in person and telephonic follow up visits as scheduled Patient will continue to work with nurse care coordinator for chronic disease management and care coordination needs      Interventions Today    Flowsheet Row Most Recent Value  Chronic Disease    Chronic disease during today's visit Other  [OSA w/CPAP]  General Interventions   General Interventions Discussed/Reviewed General Interventions Discussed, General Interventions Reviewed, Doctor Visits, Durable Medical Equipment (DME)  Doctor Visits Discussed/Reviewed Doctor Visits Discussed, Doctor Visits Reviewed, Specialist, PCP  Durable Medical Equipment (DME) Other  [CPAP]  Education Interventions   Education Provided Provided Education  Provided Verbal Education On When to see the doctor, Other  [next steps for obtaining CPAP]          SDOH assessments and interventions completed:  No     Care Coordination Interventions:  Yes, provided   Follow up plan: Follow up call scheduled for 09/29/23 @10 :30 AM    Encounter Outcome:  Patient Visit Completed

## 2023-08-06 NOTE — Progress Notes (Unsigned)
Paramedicine Encounter    Patient ID: Mark [name], male    DOB: [birth year], 62 y.o.   MRN: [id number]   Complaints NONE  Assessment A&O x 4  Compliance with meds YES  Pill box filled n/a takes from bottle  Refills needed Biktrary  Meds changes since last visit NONE    Social changes NONE   BP 120/80 (BP Location: Left Arm, Patient Position: Sitting, Cuff Size: Normal)   Pulse 80   Resp 14   Wt 242 lb (109.8 kg)   SpO2 98%   BMI 33.75 kg/m  Weight yesterday- not taken Last visit weight- 242lb  [name] A&O x 4, skin W&D w/ good color.  Lung sounds clear and equal bilat. No chest pain or SOB.  No peripheral edema noted.   He does well with med compliance and takes his meds from his pill bottles.  All meds and doses reviewed each visit and he has a strong grasp of each.  He goes to the gym 5-6 days a week and very conscious about his nutrition.  He does not eat meat and has a plant based diet. Next home visit 12/13 @ 9:00.  ACTION: Home visit completed  Mark [name] [phone] 08/06/23  Patient Care Team: Dorothyann Peng, MD as PCP - General (Internal Medicine) O'Neal, Ronnald Ramp, MD as PCP - Cardiology (Cardiology)  Patient Active Problem List   Diagnosis Date Noted   COVID-19 vaccine administered 05/25/2023   Class 2 obesity due to excess calories with body mass index (BMI) of 37.0 to 37.9 in adult 05/25/2023   Chronic gout without tophus 03/31/2023   Hypertensive heart disease with chronic diastolic congestive heart failure (HCC) 01/12/2023   Pulmonary hypertension (HCC) 01/12/2023   Asymptomatic HIV infection, with no history of HIV-related illness (HCC) 01/12/2023   HFrEF (heart failure with reduced ejection fraction) (HCC) 10/16/2022   Chronic respiratory failure with hypoxia (HCC) 10/16/2022   OSA (obstructive sleep apnea) 08/21/2022   Thyroid nodule 01/15/2020   PAD (peripheral artery disease) (HCC) 05/28/2019   Type 2  diabetes mellitus with chronic kidney disease (HCC) 09/26/2018   Obesity (BMI 30-39.9) 09/26/2018   Chronic diastolic heart failure (HCC) 05/07/2018   Cardiomegaly    Encntr for general adult medical exam w/o abnormal findings 04/25/2018   Hearing loss 04/25/2018   LBBB (left bundle branch block) 04/25/2018   Hyperlipidemia 04/25/2018   Other long term (current) drug therapy 04/25/2018   Other specified disorders of pigmentation 04/25/2018   Personal history of noncompliance with medical treatment, presenting hazards to health 04/25/2018   Sensorineural hearing loss (SNHL) of left ear 06/14/2017   Hypertensive heart disease 06/05/2013   Pure hypercholesterolemia 06/05/2013   Vitamin D deficiency 06/05/2013   Corns and callosity 11/03/2010    Current Outpatient Medications:    allopurinol (ZYLOPRIM) 100 MG tablet, Take 0.5 tablets (50 mg total) by mouth daily. Take 50 mg by mouth every other day for gout prevention, Disp: 30 tablet, Rfl: 2   amLODipine (NORVASC) 10 MG tablet, Take 1 tablet (10 mg total) by mouth daily., Disp: 30 tablet, Rfl: 2   atorvastatin (LIPITOR) 40 MG tablet, Take 1 tablet (40 mg total) by mouth daily., Disp: 30 tablet, Rfl: 11   bictegravir-emtricitabine-tenofovir AF (BIKTARVY) 50-200-25 MG TABS tablet, TAKE 1 TABLET BY MOUTH DAILY. TRY TO TAKE AT THE SAME TIME EACH DAY WITH OR WITHOUT FOOD., Disp: 30 tablet, Rfl: 5   carvedilol (COREG) 3.125 MG tablet, Take 2 tablets (  6.25 mg total) by mouth 2 (two) times daily., Disp: 360 tablet, Rfl: 3   Finerenone 10 MG TABS, Take 1 tablet (10 mg total) by mouth daily., Disp: 90 tablet, Rfl: 1   JARDIANCE 10 MG TABS tablet, TAKE 1 TABLET BY MOUTH EVERY DAY, Disp: 30 tablet, Rfl: 3   losartan (COZAAR) 100 MG tablet, TAKE 1 TABLET BY MOUTH EVERY DAY, Disp: 90 tablet, Rfl: 1   metFORMIN (GLUCOPHAGE) 1000 MG tablet, TAKE 1 TABLET (1,000 MG TOTAL) BY MOUTH TWICE A DAY WITH FOOD, Disp: 180 tablet, Rfl: 1   spironolactone (ALDACTONE)  25 MG tablet, Take 0.5 tablets (12.5 mg total) by mouth daily., Disp: 45 tablet, Rfl: 3   torsemide (DEMADEX) 20 MG tablet, TAKE 2 TABLETS (40 MG TOTAL) BY MOUTH DAILY., Disp: 180 tablet, Rfl: 1 No Known Allergies   Social History   Socioeconomic History   Marital status: Divorced    Spouse name: Not on file   Number of children: 2   Years of education: Not on file   Highest education level: Not on file  Occupational History   Occupation: self-employed    Comment: recieves disability  Tobacco Use   Smoking status: Former    Current packs/day: 0.00    Types: Cigarettes    Start date: 1998    Quit date: 1999    Years since quitting: 25.9   Smokeless tobacco: Never   Tobacco comments:    1 pack would last one month--08/01/18  Vaping Use   Vaping status: Never Used  Substance and Sexual Activity   Alcohol use: No   Drug use: No   Sexual activity: Not Currently    Partners: Female    Comment: declied condom  Other Topics Concern   Not on file  Social History Narrative   Not on file   Social Determinants of Health   Financial Resource Strain: Low Risk  (05/06/2023)   Overall Financial Resource Strain (CARDIA)    Difficulty of Paying Living Expenses: Not hard at all  Food Insecurity: No Food Insecurity (05/06/2023)   Hunger Vital Sign    Worried About Running Out of Food in the Last Year: Never true    Ran Out of Food in the Last Year: Never true  Transportation Needs: No Transportation Needs (05/06/2023)   PRAPARE - Administrator, Civil Service (Medical): No    Lack of Transportation (Non-Medical): No  Physical Activity: Sufficiently Active (05/06/2023)   Exercise Vital Sign    Days of Exercise per Week: 6 days    Minutes of Exercise per Session: 120 min  Stress: No Stress Concern Present (05/06/2023)   [employer] of Occupational Health - Occupational Stress Questionnaire    Feeling of Stress : Not at all  Social Connections: Moderately Isolated (05/06/2023)    Social Connection and Isolation Panel [NHANES]    Frequency of Communication with Friends and Family: More than three times a week    Frequency of Social Gatherings with Friends and Family: More than three times a week    Attends Religious Services: Never    Database administrator or Organizations: Yes    Attends Engineer, structural: More than 4 times per year    Marital Status: Divorced  Intimate Partner Violence: Not At Risk (05/06/2023)   Humiliation, Afraid, Rape, and Kick questionnaire    Fear of Current or Ex-Partner: No    Emotionally Abused: No    Physically Abused: No  Sexually Abused: No    Physical Exam      Future Appointments  Date Time Provider Department Center  08/06/2023 10:00 AM Little, Karma Lew, RN [facility] None  08/10/2023  8:45 AM Danelle Earthly, MD [facility]  08/10/2023  9:15 AM [facility] NURSE [facility]  09/27/2023 10:40 AM [name], FNP [facility] None  09/29/2023 10:30 AM Little, Karma Lew, RN [facility] None  11/03/2023 10:45 AM Tomma Lightning, MD [facility] None  05/10/2024 11:00 AM [facility]-ANNUAL WELLNESS VISIT [facility] None

## 2023-08-06 NOTE — Patient Instructions (Addendum)
Visit Information  Thank you for taking time to visit with me today. Please don't hesitate to contact me if I can be of assistance to you.   Following are the goals we discussed today:   Goals Addressed             This Visit's Progress    To complete a sleep study to diagnose obstructive sleep apnea   On track    Care Coordination Interventions: Received inbound call from patient  Evaluation of current treatment plan related to OSA and patient's adherence to plan as established by provider Discussed with patient he completed his f/u with Micheline Maze NP with Pulmonology regarding his OSA Discussed patient requested his order for a CPAP to be resent to [facility] and he will have the funds right after the first of the year to purchase the CPAP machine Re-Educated patient about his out of pocket for his CPAP machine per [facility]; Patient will need to pay an initial $84.12, after which patient will also pay $7.95 monthly for 12 months Instructed patient to let [employer] [facility] know once they call to confirm authorization has been processed for his CPAP, he will call in after January 1st to provide the payment necessary to obtain his equipment and patient verbalizes understanding Reviewed and discussed with patient his next scheduled follow up with nurse care coordinator is set for 09/29/23 @10 :30 AM following his PCP follow up scheduled for 09/27/23 @10 :40 AM Patient will obtain his CPAP machine after January 1st Patient will keep all scheduled in person and telephonic follow up visits as scheduled Patient will continue to work with nurse care coordinator for chronic disease management and care coordination needs          Our next appointment is by telephone on 09/29/23 at 10:30 AM  Please call the care guide team at [phone] if you need to cancel or reschedule your appointment.   If you are experiencing a Mental Health or Behavioral Health Crisis or need someone to talk to,  please call 1-800-273-TALK (toll free, 24 hour hotline)  The patient verbalized understanding of instructions, educational materials, and care plan provided today and DECLINED offer to receive copy of patient instructions, educational materials, and care plan.   Delsa Sale RN BSN CCM [facility], [facility] Nurse Care Coordinator  Direct Dial: [phone] Website: [email or web address]

## 2023-08-07 ENCOUNTER — Other Ambulatory Visit: Payer: Self-pay | Admitting: Family Medicine

## 2023-08-07 DIAGNOSIS — M1A40X0 Other secondary chronic gout, unspecified site, without tophus (tophi): Secondary | ICD-10-CM

## 2023-08-10 ENCOUNTER — Ambulatory Visit (INDEPENDENT_AMBULATORY_CARE_PROVIDER_SITE_OTHER): Payer: Medicare HMO | Admitting: Internal Medicine

## 2023-08-10 ENCOUNTER — Encounter: Payer: Self-pay | Admitting: Internal Medicine

## 2023-08-10 ENCOUNTER — Other Ambulatory Visit: Payer: Self-pay | Admitting: Nurse Practitioner

## 2023-08-10 ENCOUNTER — Ambulatory Visit: Payer: Medicare HMO

## 2023-08-10 ENCOUNTER — Other Ambulatory Visit: Payer: Self-pay

## 2023-08-10 VITALS — BP 129/78 | HR 82 | Resp 16 | Ht 71.0 in | Wt 242.0 lb

## 2023-08-10 DIAGNOSIS — N189 Chronic kidney disease, unspecified: Secondary | ICD-10-CM | POA: Diagnosis not present

## 2023-08-10 DIAGNOSIS — Z9189 Other specified personal risk factors, not elsewhere classified: Secondary | ICD-10-CM | POA: Diagnosis not present

## 2023-08-10 DIAGNOSIS — R7989 Other specified abnormal findings of blood chemistry: Secondary | ICD-10-CM

## 2023-08-10 DIAGNOSIS — Z79899 Other long term (current) drug therapy: Secondary | ICD-10-CM | POA: Diagnosis not present

## 2023-08-10 DIAGNOSIS — E1159 Type 2 diabetes mellitus with other circulatory complications: Secondary | ICD-10-CM

## 2023-08-10 DIAGNOSIS — B20 Human immunodeficiency virus [HIV] disease: Secondary | ICD-10-CM

## 2023-08-10 DIAGNOSIS — Z113 Encounter for screening for infections with a predominantly sexual mode of transmission: Secondary | ICD-10-CM | POA: Diagnosis not present

## 2023-08-10 NOTE — Progress Notes (Signed)
[facility]     HPI: Mark [name] is a 62 y.o. male presents for HIV management.Dx HIV on 12/23.  Hx He was hospitalized in December for HF exacerbation found to e HIV+ 12/26 cd4 574, VL 15.1K. Pt reports he was sexually active about four years ago. With ex wife. Reports that wife was tested and states it was negative.  Today 02/09/23: No missed doses of biktarvy. Partner broke up with her because pt has not been sexually active with her. He has not told her about the diagnosis.  ART exposure none Past OIs Risk factors: heterosexual Social: Occupation: Chief Technology Officer: apt by himself Support: has family, none know about HIV diagnosis Understanding of HIV: poor Etoh/drug/tobacco use: no/no/n Today :: Discussed the use of AI scribe software for clinical note transcription with the patient, who gave verbal consent to proceed.   The patient, with a history of HIV, heart disease, and lung disease, presents for routine follow-up. He reports adherence to his current antiretroviral therapy, Biktarvy, with no missed doses. He denies any recent changes in housing or employment. The patient has noticed the doctor's recent weight loss and inquires about his diet and exercise regimen.  The patient's HIV has been well controlled, with a nondetectable viral load at the last visit. The patient prefers to continue with his current oral medication.  The patient also has a history of heart and lung disease and is under the care of specialists for these conditions. He has not seen a nephrologist, despite a history of chronic kidney disease (CKD) and recent labs showing decreased kidney function.  The patient is up to date on his vaccinations, including the flu and COVID-19 vaccines. He also completed a home colon cancer screening test earlier this year, which was positive.         Past Medical History:  Diagnosis Date   Abnormal lung function test    a. Reported  to possibly be COPD, but per pulm note: "Possible COPD - PFT was more suggestive of restrictive defect and diffusion defect likely from obesity and CHF"   CHF (congestive heart failure) (HCC)    Chronic diastolic heart failure (HCC) 05/07/2018   CKD (chronic kidney disease), stage III (HCC)    Diabetes mellitus type 2 in obese    Elevated troponin 05/07/2018   Hearing impaired    Hyperlipidemia    Hypertension    LBBB (left bundle branch block)    LVH (left ventricular hypertrophy)    Mild aortic stenosis    Mild pulmonary hypertension (HCC)    Morbid obesity (HCC)    Pulmonary hypertension (HCC)    Sleep apnea     Past Surgical History:  Procedure Laterality Date   BACK SURGERY     RIGHT HEART CATH N/A 11/06/2022   Procedure: RIGHT HEART CATH;  Surgeon: Dorthula Nettles, DO;  Location: [facility] INVAS[facility];  Service: Cardiovascular;  Laterality: N/A;   TRACHEOSTOMY      Family History  Problem Relation Age of Onset   Hypertension Mother    Hypertension Father    Current Outpatient Medications on File Prior to Visit  Medication Sig Dispense Refill   allopurinol (ZYLOPRIM) 100 MG tablet Take 0.5 tablets (50 mg total) by mouth daily. Take 50 mg by mouth every other day for gout prevention 30 tablet 2   amLODipine (NORVASC) 10 MG tablet Take 1 tablet (10 mg total) by mouth daily. 30 tablet 2  atorvastatin (LIPITOR) 40 MG tablet Take 1 tablet (40 mg total) by mouth daily. 30 tablet 11   bictegravir-emtricitabine-tenofovir AF (BIKTARVY) 50-200-25 MG TABS tablet TAKE 1 TABLET BY MOUTH DAILY. TRY TO TAKE AT THE SAME TIME EACH DAY WITH OR WITHOUT FOOD. 30 tablet 5   carvedilol (COREG) 3.125 MG tablet Take 2 tablets (6.25 mg total) by mouth 2 (two) times daily. 360 tablet 3   Finerenone 10 MG TABS Take 1 tablet (10 mg total) by mouth daily. 90 tablet 1   JARDIANCE 10 MG TABS tablet TAKE 1 TABLET BY MOUTH EVERY DAY 30 tablet 3   losartan (COZAAR) 100 MG tablet TAKE 1 TABLET BY MOUTH EVERY  DAY 90 tablet 1   metFORMIN (GLUCOPHAGE) 1000 MG tablet TAKE 1 TABLET (1,000 MG TOTAL) BY MOUTH TWICE A DAY WITH FOOD 180 tablet 1   spironolactone (ALDACTONE) 25 MG tablet Take 0.5 tablets (12.5 mg total) by mouth daily. 45 tablet 3   torsemide (DEMADEX) 20 MG tablet TAKE 2 TABLETS (40 MG TOTAL) BY MOUTH DAILY. 180 tablet 1   No current facility-administered medications on file prior to visit.    No Known Allergies    Lab Results HIV 1 RNA Quant  Date Value  11/09/2022 Not Detected Copies/mL  09/28/2022 Not Detected Copies/mL  09/09/2022 5,490 copies/mL (H)   CD4 T Cell Abs (/uL)  Date Value  11/09/2022 807  09/28/2022 552  09/09/2022 334 (L)   No results found for: "HIV1GENOSEQ" Lab Results  Component Value Date   WBC 5.5 11/09/2022   HGB 12.6 (L) 11/09/2022   HCT 38.6 11/09/2022   MCV 90.4 11/09/2022   PLT 183 11/09/2022    Lab Results  Component Value Date   CREATININE 1.90 (H) 07/15/2023   BUN 29 (H) 07/15/2023   NA 142 07/15/2023   K 4.5 07/15/2023   CL 106 07/15/2023   CO2 30 07/15/2023   Lab Results  Component Value Date   ALT 11 11/09/2022   AST 12 11/09/2022   ALKPHOS 91 10/19/2022   BILITOT 0.6 11/09/2022    Lab Results  Component Value Date   CHOL 187 07/15/2023   TRIG 129 07/15/2023   HDL 46 07/15/2023   LDLCALC 115 (H) 07/15/2023   Lab Results  Component Value Date   HAV Reactive (A) 08/25/2022   Lab Results  Component Value Date   HEPBSAG NON REACTIVE 08/25/2022   No results found for: "HCVAB" Lab Results  Component Value Date   CHLAMYDIAWP Negative 09/09/2022   N Negative 09/09/2022   No results found for: "GCPROBEAPT" No results found for: "QUANTGOLD"  Assessment/Plan #HIV Results   LABS Viral load: nondetectable Renal function: decreased  DIAGNOSTIC Colonoscopy: positive (2024)       Assessment and Plan    HIV Viral load was undetectable at last visit. Patient is adherent to [employer]. Discussed the option of  switching to injectable therapy, but patient prefers to continue with current regimen. -Continue Biktarvy as prescribed. -Order HIV labs today. -Continue biktarvy, pt reports 100% adherence -HIV labs, F/U in 6 month   Chronic Kidney Disease Kidney function has been consistently low. Patient does not currently see a nephrologist. -Refer to nephrology for further evaluation and management.  #Cardiac amyloid #HFpEF Patient continues to follow with cardiology and pulmonology. -Continue current management with specialists.  General Health Maintenance Patient is up to date on vaccinations, including flu and COVID-19. Patient completed at-home colonoscopy this year. -Continue current preventive care measures. #Vaccination COVID  bososter 05/25/23 [id number] Monkeypox PCV 20 04/13/22 Meningitis-needs HepA-immune  HEpB(sab nr, sag nr, ab Nr), needs hbv vaccine-1st dose 09/28/22, 2nd dose 02/09/23 Tdap unk Shingles needs   #Health maintenance -Quantiferon neagive 02/09/23 -RPR NR 08/26/22 -HCV NR 08/23/22 -Oral  GC NR on 09/09/22 -Colonoscopy-cologaurd this year    Danelle Earthly, MD [facility] I have personally spent 45 minutes involved in face-to-face and non-face-to-face activities for this patient on the day of the visit. Professional time spent includes the following activities: Preparing to see the patient (review of tests), Obtaining and/or reviewing separately obtained history (admission/discharge record), Performing a medically appropriate examination and/or evaluation , Ordering medications/tests/procedures, referring and communicating with other health care professionals, Documenting clinical information in the EMR, Independently interpreting results (not separately reported), Communicating results to the patient/family/caregiver, Counseling and educating the patient/family/caregiver and Care coordination (not separately reported).

## 2023-08-11 ENCOUNTER — Telehealth (HOSPITAL_COMMUNITY): Payer: Self-pay | Admitting: Emergency Medicine

## 2023-08-11 LAB — T-HELPER CELLS (CD4) COUNT (NOT AT ARMC)
CD4 % Helper T Cell: 65 % (ref 33–65)
CD4 T Cell Abs: 863 /uL (ref 400–1790)

## 2023-08-11 NOTE — Telephone Encounter (Signed)
Message sent to Ellender Hose, FNP requesting refill for Allopurinol.    Beatrix Shipper, EMT-Paramedic [phone] 08/11/2023

## 2023-08-12 ENCOUNTER — Other Ambulatory Visit: Payer: Self-pay | Admitting: Nurse Practitioner

## 2023-08-12 DIAGNOSIS — I5032 Chronic diastolic (congestive) heart failure: Secondary | ICD-10-CM

## 2023-08-13 ENCOUNTER — Other Ambulatory Visit (HOSPITAL_COMMUNITY): Payer: Self-pay | Admitting: Emergency Medicine

## 2023-08-13 ENCOUNTER — Telehealth (HOSPITAL_COMMUNITY): Payer: Self-pay | Admitting: Cardiology

## 2023-08-13 DIAGNOSIS — I5032 Chronic diastolic (congestive) heart failure: Secondary | ICD-10-CM

## 2023-08-13 LAB — CBC WITH DIFFERENTIAL/PLATELET
Absolute Lymphocytes: 1408 {cells}/uL (ref 850–3900)
Absolute Monocytes: 435 {cells}/uL (ref 200–950)
Basophils Absolute: 61 {cells}/uL (ref 0–200)
Basophils Relative: 1.1 %
Eosinophils Absolute: 308 {cells}/uL (ref 15–500)
Eosinophils Relative: 5.6 %
HCT: 40.1 % (ref 38.5–50.0)
Hemoglobin: 13 g/dL — ABNORMAL LOW (ref 13.2–17.1)
MCH: 31 pg (ref 27.0–33.0)
MCHC: 32.4 g/dL (ref 32.0–36.0)
MCV: 95.5 fL (ref 80.0–100.0)
MPV: 11.6 fL (ref 7.5–12.5)
Monocytes Relative: 7.9 %
Neutro Abs: 3289 {cells}/uL (ref 1500–7800)
Neutrophils Relative %: 59.8 %
Platelets: 189 10*3/uL (ref 140–400)
RBC: 4.2 Million/uL (ref 4.20–5.80)
RDW: 13.2 % (ref 11.0–15.0)
Total Lymphocyte: 25.6 %
WBC: 5.5 10*3/uL (ref 3.8–10.8)

## 2023-08-13 LAB — COMPLETE METABOLIC PANEL WITH GFR
AG Ratio: 1.4 (calc) (ref 1.0–2.5)
ALT: 12 U/L (ref 9–46)
AST: 11 U/L (ref 10–35)
Albumin: 4.3 g/dL (ref 3.6–5.1)
Alkaline phosphatase (APISO): 90 U/L (ref 35–144)
BUN/Creatinine Ratio: 19 (calc) (ref 6–22)
BUN: 41 mg/dL — ABNORMAL HIGH (ref 7–25)
CO2: 33 mmol/L — ABNORMAL HIGH (ref 20–32)
Calcium: 10.1 mg/dL (ref 8.6–10.3)
Chloride: 101 mmol/L (ref 98–110)
Creat: 2.14 mg/dL — ABNORMAL HIGH (ref 0.70–1.35)
Globulin: 3.1 g/dL (ref 1.9–3.7)
Glucose, Bld: 87 mg/dL (ref 65–99)
Potassium: 4.6 mmol/L (ref 3.5–5.3)
Sodium: 142 mmol/L (ref 135–146)
Total Bilirubin: 0.5 mg/dL (ref 0.2–1.2)
Total Protein: 7.4 g/dL (ref 6.1–8.1)
eGFR: 34 mL/min/{1.73_m2} — ABNORMAL LOW (ref >=60)

## 2023-08-13 LAB — RPR: RPR Ser Ql: NONREACTIVE

## 2023-08-13 LAB — HIV-1 RNA QUANT-NO REFLEX-BLD
HIV 1 RNA Quant: NOT DETECTED {copies}/mL
HIV-1 RNA Quant, Log: NOT DETECTED {Log_copies}/mL

## 2023-08-13 LAB — HEPATITIS C ANTIBODY: Hepatitis C Ab: NONREACTIVE

## 2023-08-13 MED ORDER — CARVEDILOL 3.125 MG PO TABS: 3.1250 mg | ORAL_TABLET | Freq: Two times a day (BID) | ORAL | 3 refills | Status: DC

## 2023-08-13 NOTE — Progress Notes (Unsigned)
Paramedicine Encounter    Patient ID: Mark [name], male    DOB: [birth year], 62 y.o.   MRN: [id number]   Complaints NONE  Assessment A&O x 4, skin W&D w/ good color.  Lung sounds clear and equal bilat.  No peripheral edema noted.  Compliance with meds YES  Pill box filled n/a  Refills needed Metformin  Meds changes since last visit NONE    Social changes NONE   BP 108/70 (BP Location: Left Arm, Patient Position: Sitting, Cuff Size: Normal)   Pulse 70   Resp 14   Wt 242 lb (109.8 kg)   SpO2 98%   BMI 33.75 kg/m  Weight yesterday- not taken Last visit weight-242lb  [name] A&O x 4, skin W&D w/ good color.  Pt denies chest pain or SOB.  Lung sounds clear and equal throughout.  No peripheral edema noted.  Upon reviewing meds w/ Mark [name] determined that the instructions on his Carvedilol were incorrect.  Instructions were listed as Carvedilol 3.125mg  2 tablets (6.25mg )  BID.  Pt states he has been taking 1 tablet (3.125mg ) BID.  I called [facility] and spoke with the pharmacist to clarify the incorrect instructions and also reached out to triage and spoke with Chantel to make the necessary changes in his med list. No further issues with his meds during this visit.    Beatrix Shipper, EMT-Paramedic [phone] 08/15/2023   ACTION: Home visit completed  [name] [phone] 08/13/23  Patient Care Team: [name], FNP as PCP - General (General Practice) O'Neal, Ronnald Ramp, MD as PCP - Cardiology (Cardiology)  Patient Active Problem List   Diagnosis Date Noted   COVID-19 vaccine administered 05/25/2023   Class 2 obesity due to excess calories with body mass index (BMI) of 37.0 to 37.9 in adult 05/25/2023   Chronic gout without tophus 03/31/2023   Hypertensive heart disease with chronic diastolic congestive heart failure (HCC) 01/12/2023   Pulmonary hypertension (HCC) 01/12/2023   Asymptomatic HIV infection, with no history of  HIV-related illness (HCC) 01/12/2023   HFrEF (heart failure with reduced ejection fraction) (HCC) 10/16/2022   Chronic respiratory failure with hypoxia (HCC) 10/16/2022   OSA (obstructive sleep apnea) 08/21/2022   Thyroid nodule 01/15/2020   PAD (peripheral artery disease) (HCC) 05/28/2019   Type 2 diabetes mellitus with chronic kidney disease (HCC) 09/26/2018   Obesity (BMI 30-39.9) 09/26/2018   Chronic diastolic heart failure (HCC) 05/07/2018   Cardiomegaly    Encntr for general adult medical exam w/o abnormal findings 04/25/2018   Hearing loss 04/25/2018   LBBB (left bundle branch block) 04/25/2018   Hyperlipidemia 04/25/2018   Other long term (current) drug therapy 04/25/2018   Other specified disorders of pigmentation 04/25/2018   Personal history of noncompliance with medical treatment, presenting hazards to health 04/25/2018   Sensorineural hearing loss (SNHL) of left ear 06/14/2017   Hypertensive heart disease 06/05/2013   Pure hypercholesterolemia 06/05/2013   Vitamin D deficiency 06/05/2013   Corns and callosity 11/03/2010    Current Outpatient Medications:    allopurinol (ZYLOPRIM) 100 MG tablet, TAKE 1/2 TALBLET (50 MG) BY MOUTH EVERY OTHER DAY FOR GOUT PREVENTION, Disp: 42 tablet, Rfl: 2   amLODipine (NORVASC) 10 MG tablet, Take 1 tablet (10 mg total) by mouth daily., Disp: 30 tablet, Rfl: 2   atorvastatin (LIPITOR) 40 MG tablet, Take 1 tablet (40 mg total) by mouth daily., Disp: 30 tablet, Rfl: 11   bictegravir-emtricitabine-tenofovir AF (BIKTARVY) 50-200-25 MG TABS tablet, TAKE  1 TABLET BY MOUTH DAILY. TRY TO TAKE AT THE SAME TIME EACH DAY WITH OR WITHOUT FOOD., Disp: 30 tablet, Rfl: 5   Finerenone 10 MG TABS, Take 1 tablet (10 mg total) by mouth daily., Disp: 90 tablet, Rfl: 1   JARDIANCE 10 MG TABS tablet, TAKE 1 TABLET BY MOUTH EVERY DAY, Disp: 30 tablet, Rfl: 3   losartan (COZAAR) 100 MG tablet, TAKE 1 TABLET BY MOUTH EVERY DAY, Disp: 90 tablet, Rfl: 1   metFORMIN  (GLUCOPHAGE) 1000 MG tablet, TAKE 1 TABLET (1,000 MG TOTAL) BY MOUTH TWICE A DAY WITH FOOD, Disp: 180 tablet, Rfl: 1   spironolactone (ALDACTONE) 25 MG tablet, Take 0.5 tablets (12.5 mg total) by mouth daily., Disp: 45 tablet, Rfl: 3   torsemide (DEMADEX) 20 MG tablet, TAKE 2 TABLETS (40 MG TOTAL) BY MOUTH DAILY., Disp: 180 tablet, Rfl: 1   carvedilol (COREG) 3.125 MG tablet, Take 1 tablet (3.125 mg total) by mouth 2 (two) times daily., Disp: 180 tablet, Rfl: 3 No Known Allergies   Social History   Socioeconomic History   Marital status: Divorced    Spouse name: Not on file   Number of children: 2   Years of education: Not on file   Highest education level: Not on file  Occupational History   Occupation: self-employed    Comment: recieves disability  Tobacco Use   Smoking status: Former    Current packs/day: 0.00    Types: Cigarettes    Start date: 1998    Quit date: 1999    Years since quitting: 25.9   Smokeless tobacco: Never   Tobacco comments:    1 pack would last one month--08/01/18  Vaping Use   Vaping status: Never Used  Substance and Sexual Activity   Alcohol use: No   Drug use: No   Sexual activity: Not Currently    Partners: Female    Comment: declied condom  Other Topics Concern   Not on file  Social History Narrative   Not on file   Social Drivers of Health   Financial Resource Strain: Low Risk  (05/06/2023)   Overall Financial Resource Strain (CARDIA)    Difficulty of Paying Living Expenses: Not hard at all  Food Insecurity: No Food Insecurity (05/06/2023)   Hunger Vital Sign    Worried About Running Out of Food in the Last Year: Never true    Ran Out of Food in the Last Year: Never true  Transportation Needs: No Transportation Needs (05/06/2023)   PRAPARE - Administrator, Civil Service (Medical): No    Lack of Transportation (Non-Medical): No  Physical Activity: Sufficiently Active (05/06/2023)   Exercise Vital Sign    Days of Exercise per  Week: 6 days    Minutes of Exercise per Session: 120 min  Stress: No Stress Concern Present (05/06/2023)   [employer] of Occupational Health - Occupational Stress Questionnaire    Feeling of Stress : Not at all  Social Connections: Moderately Isolated (05/06/2023)   Social Connection and Isolation Panel [NHANES]    Frequency of Communication with Friends and Family: More than three times a week    Frequency of Social Gatherings with Friends and Family: More than three times a week    Attends Religious Services: Never    Database administrator or Organizations: Yes    Attends Engineer, structural: More than 4 times per year    Marital Status: Divorced  Catering manager Violence: Not At Risk (  05/06/2023)   Humiliation, Afraid, Rape, and Kick questionnaire    Fear of Current or Ex-Partner: No    Emotionally Abused: No    Physically Abused: No    Sexually Abused: No    Physical Exam      Future Appointments  Date Time Provider Department Center  09/27/2023 10:40 AM [name], FNP [facility] None  09/29/2023 10:30 AM Little, Karma Lew, RN [facility] None  11/03/2023 10:45 AM Tomma Lightning, MD [facility] None  02/07/2024  8:45 AM Danelle Earthly, MD [facility]  05/10/2024 11:00 AM [facility]NUAL WELLNESS VISIT [facility] None

## 2023-08-13 NOTE — Telephone Encounter (Signed)
DEE DEE WITH [facility] CALLED WITH MEDICATION DISCREPANCY    COREG DECREASED TO 3.125 BID ON 11/14 HOWEVER SCRIPT WAS NOT CHANGED  [facility] CHANGED TO REFLECT CHANGE BY PROVIDER

## 2023-08-16 ENCOUNTER — Other Ambulatory Visit: Payer: Self-pay

## 2023-08-16 DIAGNOSIS — E1159 Type 2 diabetes mellitus with other circulatory complications: Secondary | ICD-10-CM

## 2023-08-16 MED ORDER — METFORMIN HCL 1000 MG PO TABS: ORAL_TABLET | ORAL | 1 refills | Status: DC

## 2023-08-19 ENCOUNTER — Other Ambulatory Visit: Payer: Self-pay | Admitting: General Practice

## 2023-08-20 ENCOUNTER — Other Ambulatory Visit (HOSPITAL_COMMUNITY): Payer: Self-pay | Admitting: Emergency Medicine

## 2023-08-20 NOTE — Progress Notes (Signed)
Paramedicine Encounter    Patient ID: Mark [name], male    DOB: 1961-05-07, 62 y.o.   MRN: [id number]   Complaints NONE  Assessment A&O x 4, skin W&D w/ good color.  Lung souds clear throughout and no peripheral edema noted.  Compliance with meds YES  Pill box filled n/a  Refills needed NONE  Meds changes since last visit NONE    Social changes NONE   BP 110/80 (BP Location: Left Arm, Patient Position: Sitting, Cuff Size: Normal)   Pulse 72   Resp 14   Wt 241 lb 9.6 oz (109.6 kg)   SpO2 97%   BMI 33.70 kg/m  Weight yesterday- not taken Last visit weight-242lb  [name] A&O x 4, skin W&D w/ good color.  Lung sounds clear and equal bilat.  No peripheral edema noted.  Discussed today issues he is having with [facility] not filling the right dosing of his meds.  I advised him I would reach out to the pharm to review his med profile.   Pt does well with med compliance.  Today he will be receiving his CPAP machine.  ACTION: Home visit completed  Mark [name] [phone] 08/20/23  Patient Care Team: [name], FNP as PCP - General (General Practice) O'Neal, Ronnald Ramp, MD as PCP - Cardiology (Cardiology)  Patient Active Problem List   Diagnosis Date Noted   COVID-19 vaccine administered 05/25/2023   Class 2 obesity due to excess calories with body mass index (BMI) of 37.0 to 37.9 in adult 05/25/2023   Chronic gout without tophus 03/31/2023   Hypertensive heart disease with chronic diastolic congestive heart failure (HCC) 01/12/2023   Pulmonary hypertension (HCC) 01/12/2023   Asymptomatic HIV infection, with no history of HIV-related illness (HCC) 01/12/2023   HFrEF (heart failure with reduced ejection fraction) (HCC) 10/16/2022   Chronic respiratory failure with hypoxia (HCC) 10/16/2022   OSA (obstructive sleep apnea) 08/21/2022   Thyroid nodule 01/15/2020   PAD (peripheral artery disease) (HCC) 05/28/2019   Type 2 diabetes  mellitus with chronic kidney disease (HCC) 09/26/2018   Obesity (BMI 30-39.9) 09/26/2018   Chronic diastolic heart failure (HCC) 05/07/2018   Cardiomegaly    Encntr for general adult medical exam w/o abnormal findings 04/25/2018   Hearing loss 04/25/2018   LBBB (left bundle branch block) 04/25/2018   Hyperlipidemia 04/25/2018   Other long term (current) drug therapy 04/25/2018   Other specified disorders of pigmentation 04/25/2018   Personal history of noncompliance with medical treatment, presenting hazards to health 04/25/2018   Sensorineural hearing loss (SNHL) of left ear 06/14/2017   Hypertensive heart disease 06/05/2013   Pure hypercholesterolemia 06/05/2013   Vitamin D deficiency 06/05/2013   Corns and callosity 11/03/2010    Current Outpatient Medications:    allopurinol (ZYLOPRIM) 100 MG tablet, TAKE 1/2 TALBLET (50 MG) BY MOUTH EVERY OTHER DAY FOR GOUT PREVENTION, Disp: 42 tablet, Rfl: 2   amLODipine (NORVASC) 10 MG tablet, Take 1 tablet (10 mg total) by mouth daily., Disp: 30 tablet, Rfl: 2   atorvastatin (LIPITOR) 40 MG tablet, Take 1 tablet (40 mg total) by mouth daily., Disp: 30 tablet, Rfl: 11   bictegravir-emtricitabine-tenofovir AF (BIKTARVY) 50-200-25 MG TABS tablet, TAKE 1 TABLET BY MOUTH DAILY. TRY TO TAKE AT THE SAME TIME EACH DAY WITH OR WITHOUT FOOD., Disp: 30 tablet, Rfl: 5   carvedilol (COREG) 3.125 MG tablet, Take 1 tablet (3.125 mg total) by mouth 2 (two) times daily., Disp: 180 tablet, Rfl: 3  empagliflozin (JARDIANCE) 10 MG TABS tablet, TAKE 1 TABLET BY MOUTH EVERY DAY, Disp: 30 tablet, Rfl: 4   Finerenone 10 MG TABS, Take 1 tablet (10 mg total) by mouth daily., Disp: 90 tablet, Rfl: 1   losartan (COZAAR) 100 MG tablet, TAKE 1 TABLET BY MOUTH EVERY DAY, Disp: 90 tablet, Rfl: 1   metFORMIN (GLUCOPHAGE) 1000 MG tablet, TAKE 1 TABLET (1,000 MG TOTAL) BY MOUTH TWICE A DAY WITH FOOD, Disp: 180 tablet, Rfl: 1   spironolactone (ALDACTONE) 25 MG tablet, Take 0.5  tablets (12.5 mg total) by mouth daily., Disp: 45 tablet, Rfl: 3   torsemide (DEMADEX) 20 MG tablet, TAKE 2 TABLETS (40 MG TOTAL) BY MOUTH DAILY., Disp: 180 tablet, Rfl: 1 No Known Allergies   Social History   Socioeconomic History   Marital status: Divorced    Spouse name: Not on file   Number of children: 2   Years of education: Not on file   Highest education level: Not on file  Occupational History   Occupation: self-employed    Comment: recieves disability  Tobacco Use   Smoking status: Former    Current packs/day: 0.00    Types: Cigarettes    Start date: 1998    Quit date: 1999    Years since quitting: 25.9   Smokeless tobacco: Never   Tobacco comments:    1 pack would last one month--08/01/18  Vaping Use   Vaping status: Never Used  Substance and Sexual Activity   Alcohol use: No   Drug use: No   Sexual activity: Not Currently    Partners: Female    Comment: declied condom  Other Topics Concern   Not on file  Social History Narrative   Not on file   Social Drivers of Health   Financial Resource Strain: Low Risk  (05/06/2023)   Overall Financial Resource Strain (CARDIA)    Difficulty of Paying Living Expenses: Not hard at all  Food Insecurity: No Food Insecurity (05/06/2023)   Hunger Vital Sign    Worried About Running Out of Food in the Last Year: Never true    Ran Out of Food in the Last Year: Never true  Transportation Needs: No Transportation Needs (05/06/2023)   PRAPARE - Administrator, Civil Service (Medical): No    Lack of Transportation (Non-Medical): No  Physical Activity: Sufficiently Active (05/06/2023)   Exercise Vital Sign    Days of Exercise per Week: 6 days    Minutes of Exercise per Session: 120 min  Stress: No Stress Concern Present (05/06/2023)   [employer] of Occupational Health - Occupational Stress Questionnaire    Feeling of Stress : Not at all  Social Connections: Moderately Isolated (05/06/2023)   Social Connection and  Isolation Panel [NHANES]    Frequency of Communication with Friends and Family: More than three times a week    Frequency of Social Gatherings with Friends and Family: More than three times a week    Attends Religious Services: Never    Database administrator or Organizations: Yes    Attends Engineer, structural: More than 4 times per year    Marital Status: Divorced  Intimate Partner Violence: Not At Risk (05/06/2023)   Humiliation, Afraid, Rape, and Kick questionnaire    Fear of Current or Ex-Partner: No    Emotionally Abused: No    Physically Abused: No    Sexually Abused: No    Physical Exam      Future Appointments  Date Time Provider Department Center  09/27/2023 10:40 AM [name], FNP [facility] None  09/29/2023 10:30 AM Clarene Duke, Karma Lew, RN [facility] None  11/03/2023 10:45 AM Tomma Lightning, MD [facility] None  02/07/2024  8:45 AM Danelle Earthly, MD [facility]  05/10/2024 11:00 AM [facility]ANNUAL WELLNESS VISIT [facility] None

## 2023-08-27 ENCOUNTER — Telehealth: Payer: Self-pay | Admitting: Student

## 2023-08-27 ENCOUNTER — Other Ambulatory Visit (HOSPITAL_COMMUNITY): Payer: Self-pay | Admitting: Emergency Medicine

## 2023-08-27 NOTE — Telephone Encounter (Signed)
PT calling and wonders if he should use his new CPAP with 02. Right now it is not part of his set up. His # is [phone]   [facility] states: He is will to start CPAP after the first of the year. Will replace orders today for new start CPAP auto 10-16 cmH2O with 2 lpm supplemental O2 bled through.

## 2023-08-27 NOTE — Progress Notes (Signed)
Paramedicine Encounter    Patient ID: Mark [name], male    DOB: 1961-04-14, 62 y.o.   MRN: [id number]   Complaints NONE  Assessment A&O x 4, skin W&D w/ good color.  Lung sounds clear and equal bilat. No peripheral edema noted.  Compliance with meds YES  Pill box filled n/a  Refills needed NONE  Meds changes since last visit NONE    Social changes none   BP 110/70 (BP Location: Left Arm, Cuff Size: Normal)   Pulse 70   Resp 14   Wt 242 lb (109.8 kg)   SpO2 96%   BMI 33.75 kg/m  Weight yesterday-not taken Last visit weight-241lb  Pt reports to be feeling well today.  He is compliant with all meds.  He received his CPAP machine and has questions about his mask needing O2 adapter.  Assisted him with calling his pulmonologist to inquire about his mask and office advised they would call him back to give him answers to his questions.    ACTION: Home visit completed  [name] [phone] 08/27/23  Patient Care Team: [name], FNP as PCP - General (General Practice) O'Neal, Ronnald Ramp, MD as PCP - Cardiology (Cardiology)  Patient Active Problem List   Diagnosis Date Noted   COVID-19 vaccine administered 05/25/2023   Class 2 obesity due to excess calories with body mass index (BMI) of 37.0 to 37.9 in adult 05/25/2023   Chronic gout without tophus 03/31/2023   Hypertensive heart disease with chronic diastolic congestive heart failure (HCC) 01/12/2023   Pulmonary hypertension (HCC) 01/12/2023   Asymptomatic HIV infection, with no history of HIV-related illness (HCC) 01/12/2023   HFrEF (heart failure with reduced ejection fraction) (HCC) 10/16/2022   Chronic respiratory failure with hypoxia (HCC) 10/16/2022   OSA (obstructive sleep apnea) 08/21/2022   Thyroid nodule 01/15/2020   PAD (peripheral artery disease) (HCC) 05/28/2019   Type 2 diabetes mellitus with chronic kidney disease (HCC) 09/26/2018   Obesity (BMI 30-39.9) 09/26/2018    Chronic diastolic heart failure (HCC) 05/07/2018   Cardiomegaly    Encntr for general adult medical exam w/o abnormal findings 04/25/2018   Hearing loss 04/25/2018   LBBB (left bundle branch block) 04/25/2018   Hyperlipidemia 04/25/2018   Other long term (current) drug therapy 04/25/2018   Other specified disorders of pigmentation 04/25/2018   Personal history of noncompliance with medical treatment, presenting hazards to health 04/25/2018   Sensorineural hearing loss (SNHL) of left ear 06/14/2017   Hypertensive heart disease 06/05/2013   Pure hypercholesterolemia 06/05/2013   Vitamin D deficiency 06/05/2013   Corns and callosity 11/03/2010    Current Outpatient Medications:    allopurinol (ZYLOPRIM) 100 MG tablet, TAKE 1/2 TALBLET (50 MG) BY MOUTH EVERY OTHER DAY FOR GOUT PREVENTION, Disp: 42 tablet, Rfl: 2   amLODipine (NORVASC) 10 MG tablet, Take 1 tablet (10 mg total) by mouth daily., Disp: 30 tablet, Rfl: 2   atorvastatin (LIPITOR) 40 MG tablet, Take 1 tablet (40 mg total) by mouth daily., Disp: 30 tablet, Rfl: 11   bictegravir-emtricitabine-tenofovir AF (BIKTARVY) 50-200-25 MG TABS tablet, TAKE 1 TABLET BY MOUTH DAILY. TRY TO TAKE AT THE SAME TIME EACH DAY WITH OR WITHOUT FOOD., Disp: 30 tablet, Rfl: 5   carvedilol (COREG) 3.125 MG tablet, Take 1 tablet (3.125 mg total) by mouth 2 (two) times daily., Disp: 180 tablet, Rfl: 3   empagliflozin (JARDIANCE) 10 MG TABS tablet, TAKE 1 TABLET BY MOUTH EVERY DAY, Disp: 30 tablet, Rfl: 4  Finerenone 10 MG TABS, Take 1 tablet (10 mg total) by mouth daily., Disp: 90 tablet, Rfl: 1   losartan (COZAAR) 100 MG tablet, TAKE 1 TABLET BY MOUTH EVERY DAY, Disp: 90 tablet, Rfl: 1   metFORMIN (GLUCOPHAGE) 1000 MG tablet, TAKE 1 TABLET (1,000 MG TOTAL) BY MOUTH TWICE A DAY WITH FOOD, Disp: 180 tablet, Rfl: 1   spironolactone (ALDACTONE) 25 MG tablet, Take 0.5 tablets (12.5 mg total) by mouth daily., Disp: 45 tablet, Rfl: 3   torsemide (DEMADEX) 20 MG  tablet, TAKE 2 TABLETS (40 MG TOTAL) BY MOUTH DAILY., Disp: 180 tablet, Rfl: 1 No Known Allergies   Social History   Socioeconomic History   Marital status: Divorced    Spouse name: Not on file   Number of children: 2   Years of education: Not on file   Highest education level: Not on file  Occupational History   Occupation: self-employed    Comment: recieves disability  Tobacco Use   Smoking status: Former    Current packs/day: 0.00    Types: Cigarettes    Start date: 1998    Quit date: 1999    Years since quitting: 26.0   Smokeless tobacco: Never   Tobacco comments:    1 pack would last one month--08/01/18  Vaping Use   Vaping status: Never Used  Substance and Sexual Activity   Alcohol use: No   Drug use: No   Sexual activity: Not Currently    Partners: Female    Comment: declied condom  Other Topics Concern   Not on file  Social History Narrative   Not on file   Social Drivers of Health   Financial Resource Strain: Low Risk  (05/06/2023)   Overall Financial Resource Strain (CARDIA)    Difficulty of Paying Living Expenses: Not hard at all  Food Insecurity: No Food Insecurity (05/06/2023)   Hunger Vital Sign    Worried About Running Out of Food in the Last Year: Never true    Ran Out of Food in the Last Year: Never true  Transportation Needs: No Transportation Needs (05/06/2023)   PRAPARE - Administrator, Civil Service (Medical): No    Lack of Transportation (Non-Medical): No  Physical Activity: Sufficiently Active (05/06/2023)   Exercise Vital Sign    Days of Exercise per Week: 6 days    Minutes of Exercise per Session: 120 min  Stress: No Stress Concern Present (05/06/2023)   [employer] of Occupational Health - Occupational Stress Questionnaire    Feeling of Stress : Not at all  Social Connections: Moderately Isolated (05/06/2023)   Social Connection and Isolation Panel [NHANES]    Frequency of Communication with Friends and Family: More than three  times a week    Frequency of Social Gatherings with Friends and Family: More than three times a week    Attends Religious Services: Never    Database administrator or Organizations: Yes    Attends Banker Meetings: More than 4 times per year    Marital Status: Divorced  Intimate Partner Violence: Not At Risk (05/06/2023)   Humiliation, Afraid, Rape, and Kick questionnaire    Fear of Current or Ex-Partner: No    Emotionally Abused: No    Physically Abused: No    Sexually Abused: No    Physical Exam      Future Appointments  Date Time Provider Department Center  09/27/2023 10:40 AM [name], FNP [facility] None  09/29/2023 10:30 AM Little,  Karma Lew, RN [facility] None  11/03/2023 10:45 AM Tomma Lightning, MD [facility] None  02/07/2024  8:45 AM Danelle Earthly, MD [facility]  05/10/2024 11:00 AM [facility]ANNUAL WELLNESS VISIT [facility] None

## 2023-09-02 ENCOUNTER — Other Ambulatory Visit (HOSPITAL_COMMUNITY): Payer: Self-pay | Admitting: Emergency Medicine

## 2023-09-02 ENCOUNTER — Other Ambulatory Visit (HOSPITAL_COMMUNITY): Payer: Self-pay

## 2023-09-02 NOTE — Progress Notes (Signed)
 Paramedicine Encounter    Patient ID: Mark [name] [place], male    DOB: [birth year], 63 y.o.   MRN: [id number]   Complaints NONE  Assessment A&O x 4, skin W&D w/ good color.  Denies chest pain or SOB.  Lung sounds clear and equal bilat.  No peripheral edema  Compliance with meds YES  Pill box filled n/a  Refills needed Allopurinol   Meds changes since last visit NONE    Social changes NONE  Attempted to assist pt w/ obtaining O2 adapter for supplemental oxygen  for his CPAP machine from [employer].  He has attempted to address this issue himself w/o success.  He has not yet started to use his CPAP machine because he did not want to use machine w/o his supplemental O2.   All meds reviewed for dosages and times and pt demonstrates strong grasp of his care plan.  He goes to the gym on a regular basis and says he is diligent in watching his nutrition.   Next home visit 09/10/23 @ 9:00.   BP 118/70 (BP Location: Left Arm, Patient Position: Sitting, Cuff Size: Normal)   Pulse 74   Resp 14   Wt 244 lb 9.6 oz (110.9 kg)   BMI 34.11 kg/m  Weight yesterday- not taken Last visit weight-242lb  ACTION: Home visit completed  Mary Claudene Kennel [phone] 09/02/23  Patient Care Team: Georgina Speaks, FNP as PCP - General (General Practice) O'Neal, Darryle Ned, MD as PCP - Cardiology (Cardiology)  Patient Active Problem List   Diagnosis Date Noted   COVID-19 vaccine administered 05/25/2023   Class 2 obesity due to excess calories with body mass index (BMI) of 37.0 to 37.9 in adult 05/25/2023   Chronic gout without tophus 03/31/2023   Hypertensive heart disease with chronic diastolic congestive heart failure (HCC) 01/12/2023   Pulmonary hypertension (HCC) 01/12/2023   Asymptomatic HIV infection, with no history of HIV-related illness (HCC) 01/12/2023   HFrEF (heart failure with reduced ejection fraction) (HCC) 10/16/2022   Chronic respiratory failure with hypoxia (HCC)  10/16/2022   OSA (obstructive sleep apnea) 08/21/2022   Thyroid  nodule 01/15/2020   PAD (peripheral artery disease) (HCC) 05/28/2019   Type 2 diabetes mellitus with chronic kidney disease (HCC) 09/26/2018   Obesity (BMI 30-39.9) 09/26/2018   Chronic diastolic heart failure (HCC) 05/07/2018   Cardiomegaly    Encntr for general adult medical exam w/o abnormal findings 04/25/2018   Hearing loss 04/25/2018   LBBB (left bundle branch block) 04/25/2018   Hyperlipidemia 04/25/2018   Other long term (current) drug therapy 04/25/2018   Other specified disorders of pigmentation 04/25/2018   Personal history of noncompliance with medical treatment, presenting hazards to health 04/25/2018   Sensorineural hearing loss (SNHL) of left ear 06/14/2017   Hypertensive heart disease 06/05/2013   Pure hypercholesterolemia 06/05/2013   Vitamin D deficiency 06/05/2013   Corns and callosity 11/03/2010    Current Outpatient Medications:    allopurinol  (ZYLOPRIM ) 100 MG tablet, TAKE 1/2 TALBLET (50 MG) BY MOUTH EVERY OTHER DAY FOR GOUT PREVENTION, Disp: 42 tablet, Rfl: 2   amLODipine  (NORVASC ) 10 MG tablet, Take 1 tablet (10 mg total) by mouth daily., Disp: 30 tablet, Rfl: 2   atorvastatin  (LIPITOR) 40 MG tablet, Take 1 tablet (40 mg total) by mouth daily., Disp: 30 tablet, Rfl: 11   bictegravir-emtricitabine-tenofovir AF (BIKTARVY ) 50-200-25 MG TABS tablet, TAKE 1 TABLET BY MOUTH DAILY. TRY TO TAKE AT THE SAME TIME EACH DAY WITH OR WITHOUT FOOD., Disp: 30 tablet,  Rfl: 5   carvedilol  (COREG ) 3.125 MG tablet, Take 1 tablet (3.125 mg total) by mouth 2 (two) times daily., Disp: 180 tablet, Rfl: 3   empagliflozin  (JARDIANCE ) 10 MG TABS tablet, TAKE 1 TABLET BY MOUTH EVERY DAY, Disp: 30 tablet, Rfl: 4   Finerenone  10 MG TABS, Take 1 tablet (10 mg total) by mouth daily., Disp: 90 tablet, Rfl: 1   losartan  (COZAAR ) 100 MG tablet, TAKE 1 TABLET BY MOUTH EVERY DAY, Disp: 90 tablet, Rfl: 1   metFORMIN  (GLUCOPHAGE ) 1000  MG tablet, TAKE 1 TABLET (1,000 MG TOTAL) BY MOUTH TWICE A DAY WITH FOOD, Disp: 180 tablet, Rfl: 1   spironolactone  (ALDACTONE ) 25 MG tablet, Take 0.5 tablets (12.5 mg total) by mouth daily., Disp: 45 tablet, Rfl: 3   torsemide  (DEMADEX ) 20 MG tablet, TAKE 2 TABLETS (40 MG TOTAL) BY MOUTH DAILY., Disp: 180 tablet, Rfl: 1 No Known Allergies   Social History   Socioeconomic History   Marital status: Divorced    Spouse name: Not on file   Number of children: 2   Years of education: Not on file   Highest education level: Not on file  Occupational History   Occupation: self-employed    Comment: recieves disability  Tobacco Use   Smoking status: Former    Current packs/day: 0.00    Types: Cigarettes    Start date: 1998    Quit date: 1999    Years since quitting: 26.0   Smokeless tobacco: Never   Tobacco comments:    1 pack would last one month--08/01/18  Vaping Use   Vaping status: Never Used  Substance and Sexual Activity   Alcohol use: No   Drug use: No   Sexual activity: Not Currently    Partners: Female    Comment: declied condom  Other Topics Concern   Not on file  Social History Narrative   Not on file   Social Drivers of Health   Financial Resource Strain: Low Risk  (05/06/2023)   Overall Financial Resource Strain (CARDIA)    Difficulty of Paying Living Expenses: Not hard at all  Food Insecurity: No Food Insecurity (05/06/2023)   Hunger Vital Sign    Worried About Running Out of Food in the Last Year: Never true    Ran Out of Food in the Last Year: Never true  Transportation Needs: No Transportation Needs (05/06/2023)   PRAPARE - Administrator, Civil Service (Medical): No    Lack of Transportation (Non-Medical): No  Physical Activity: Sufficiently Active (05/06/2023)   Exercise Vital Sign    Days of Exercise per Week: 6 days    Minutes of Exercise per Session: 120 min  Stress: No Stress Concern Present (05/06/2023)   [employer] of Occupational Health  - Occupational Stress Questionnaire    Feeling of Stress : Not at all  Social Connections: Moderately Isolated (05/06/2023)   Social Connection and Isolation Panel [NHANES]    Frequency of Communication with Friends and Family: More than three times a week    Frequency of Social Gatherings with Friends and Family: More than three times a week    Attends Religious Services: Never    Database Administrator or Organizations: Yes    Attends Engineer, Structural: More than 4 times per year    Marital Status: Divorced  Intimate Partner Violence: Not At Risk (05/06/2023)   Humiliation, Afraid, Rape, and Kick questionnaire    Fear of Current or Ex-Partner: No  Emotionally Abused: No    Physically Abused: No    Sexually Abused: No    Physical Exam      Future Appointments  Date Time Provider Department Center  09/27/2023 10:40 AM Georgina Speaks, FNP [facility] None  09/29/2023 10:30 AM Little, Clayborne CROME, RN [facility] None  11/03/2023 10:45 AM Neda Jennet LABOR, MD [facility] None  02/07/2024  8:45 AM Dennise Kingsley, MD [facility]  05/10/2024 11:00 AM [facility]-ANNUAL WELLNESS VISIT [facility] None

## 2023-09-03 ENCOUNTER — Other Ambulatory Visit (HOSPITAL_COMMUNITY): Payer: Self-pay

## 2023-09-03 ENCOUNTER — Telehealth (HOSPITAL_COMMUNITY): Payer: Self-pay | Admitting: Emergency Medicine

## 2023-09-03 ENCOUNTER — Other Ambulatory Visit: Payer: Self-pay | Admitting: Internal Medicine

## 2023-09-03 ENCOUNTER — Telehealth: Payer: Self-pay | Admitting: Infectious Disease

## 2023-09-03 DIAGNOSIS — B20 Human immunodeficiency virus [HIV] disease: Secondary | ICD-10-CM

## 2023-09-03 MED ORDER — BIKTARVY 50-200-25 MG PO TABS: 1.0000 | ORAL_TABLET | Freq: Every day | ORAL | 11 refills | Status: DC

## 2023-09-03 NOTE — Telephone Encounter (Signed)
 I was paged re the patient who is out of his meds  Technically the primary ID provider or the [name] provider should have been paged  Regardless [facility] are [place] in their immense generosity refused to fill his Biktarvy  without him returning with a new [employer] co-pay card.  I am therefore sending his BIKTARVY  to [facility] which is an actual functional pharmacy and endeavoring to see if my inpatient staff can assist with making sure he has an active co-pay card

## 2023-09-03 NOTE — Telephone Encounter (Signed)
 Called and spoke with pt, who states he is doing well on cpap. Received his cpap supplies and o2. Nfn

## 2023-09-03 NOTE — Telephone Encounter (Signed)
 Called to follow up w/ Mark [name] as he used his CPAP machine for the first time last night.  He reported no issues.  He was able to utilize the O2 adapter to connect his supplemental oxygen  w/o incident.  He states he felt he slept well w/ the device last night.    Mary Sharps, EMT-Paramedic [phone] 09/03/2023

## 2023-09-06 ENCOUNTER — Other Ambulatory Visit: Payer: Self-pay

## 2023-09-06 ENCOUNTER — Other Ambulatory Visit (HOSPITAL_COMMUNITY): Payer: Self-pay

## 2023-09-06 NOTE — Telephone Encounter (Signed)
 Patient LVM needing a new copay card

## 2023-09-09 ENCOUNTER — Other Ambulatory Visit (HOSPITAL_COMMUNITY): Payer: Self-pay | Admitting: Emergency Medicine

## 2023-09-09 NOTE — Progress Notes (Signed)
 Paramedicine Encounter    Patient ID: Mark [name], male    DOB: [birth year], 63 y.o.   MRN: [id number]   Complaints ONE  Assessment A&O x4, skin W&D w/ good color.  Denies chest pain or SOB. Lung sounds clear and equal bilat.  No peripheral edema noted  Compliance with meds YES   Pill box filled n/a  Refills needed NONE  Meds changes since last visit NONE    Social changes NONE   BP 118/78 (BP Location: Left Arm, Patient Position: Sitting)   Pulse 68   Resp 16   Wt 243 lb (110.2 kg)   SpO2 99%   BMI 33.89 kg/m  Weight yesterday-not taken Last visit weight-244lb  [name] doing well.  He denies chest pain or SOB.  Lung sounds clear and equal bilat.  No peripheral edema noted.  I picked up his Allopurinol  and brought to his for todays visit.  He is compliant with all meds.   Next home visit 09/16/23 @ 10:00  ACTION: Home visit completed  Mark [name] [phone] 09/09/23  Patient Care Team: Georgina Speaks, FNP as PCP - General (General Practice) O'Neal, Darryle Ned, MD as PCP - Cardiology (Cardiology)  Patient Active Problem List   Diagnosis Date Noted   COVID-19 vaccine administered 05/25/2023   Class 2 obesity due to excess calories with body mass index (BMI) of 37.0 to 37.9 in adult 05/25/2023   Chronic gout without tophus 03/31/2023   Hypertensive heart disease with chronic diastolic congestive heart failure (HCC) 01/12/2023   Pulmonary hypertension (HCC) 01/12/2023   Asymptomatic HIV infection, with no history of HIV-related illness (HCC) 01/12/2023   HFrEF (heart failure with reduced ejection fraction) (HCC) 10/16/2022   Chronic respiratory failure with hypoxia (HCC) 10/16/2022   OSA (obstructive sleep apnea) 08/21/2022   Thyroid  nodule 01/15/2020   PAD (peripheral artery disease) (HCC) 05/28/2019   Type 2 diabetes mellitus with chronic kidney disease (HCC) 09/26/2018   Obesity (BMI 30-39.9) 09/26/2018   Chronic diastolic heart  failure (HCC) 90/92/7980   Cardiomegaly    Encntr for general adult medical exam w/o abnormal findings 04/25/2018   Hearing loss 04/25/2018   LBBB (left bundle branch block) 04/25/2018   Hyperlipidemia 04/25/2018   Other long term (current) drug therapy 04/25/2018   Other specified disorders of pigmentation 04/25/2018   Personal history of noncompliance with medical treatment, presenting hazards to health 04/25/2018   Sensorineural hearing loss (SNHL) of left ear 06/14/2017   Hypertensive heart disease 06/05/2013   Pure hypercholesterolemia 06/05/2013   Vitamin D deficiency 06/05/2013   Corns and callosity 11/03/2010    Current Outpatient Medications:    allopurinol  (ZYLOPRIM ) 100 MG tablet, TAKE 1/2 TALBLET (50 MG) BY MOUTH EVERY OTHER DAY FOR GOUT PREVENTION, Disp: 42 tablet, Rfl: 2   amLODipine  (NORVASC ) 10 MG tablet, Take 1 tablet (10 mg total) by mouth daily., Disp: 30 tablet, Rfl: 2   atorvastatin  (LIPITOR) 40 MG tablet, Take 1 tablet (40 mg total) by mouth daily., Disp: 30 tablet, Rfl: 11   bictegravir-emtricitabine-tenofovir AF (BIKTARVY ) 50-200-25 MG TABS tablet, Take 1 tablet by mouth daily., Disp: 30 tablet, Rfl: 11   carvedilol  (COREG ) 3.125 MG tablet, Take 1 tablet (3.125 mg total) by mouth 2 (two) times daily., Disp: 180 tablet, Rfl: 3   empagliflozin  (JARDIANCE ) 10 MG TABS tablet, TAKE 1 TABLET BY MOUTH EVERY DAY, Disp: 30 tablet, Rfl: 4   Finerenone  10 MG TABS, Take 1 tablet (10 mg total) by mouth  daily., Disp: 90 tablet, Rfl: 1   losartan  (COZAAR ) 100 MG tablet, TAKE 1 TABLET BY MOUTH EVERY DAY, Disp: 90 tablet, Rfl: 1   metFORMIN  (GLUCOPHAGE ) 1000 MG tablet, TAKE 1 TABLET (1,000 MG TOTAL) BY MOUTH TWICE A DAY WITH FOOD, Disp: 180 tablet, Rfl: 1   spironolactone  (ALDACTONE ) 25 MG tablet, Take 0.5 tablets (12.5 mg total) by mouth daily., Disp: 45 tablet, Rfl: 3   torsemide  (DEMADEX ) 20 MG tablet, TAKE 2 TABLETS (40 MG TOTAL) BY MOUTH DAILY., Disp: 180 tablet, Rfl: 1 No  Known Allergies   Social History   Socioeconomic History   Marital status: Divorced    Spouse name: Not on file   Number of children: 2   Years of education: Not on file   Highest education level: Not on file  Occupational History   Occupation: self-employed    Comment: recieves disability  Tobacco Use   Smoking status: Former    Current packs/day: 0.00    Types: Cigarettes    Start date: 1998    Quit date: 1999    Years since quitting: 26.0   Smokeless tobacco: Never   Tobacco comments:    1 pack would last one month--08/01/18  Vaping Use   Vaping status: Never Used  Substance and Sexual Activity   Alcohol use: No   Drug use: No   Sexual activity: Not Currently    Partners: Female    Comment: declied condom  Other Topics Concern   Not on file  Social History Narrative   Not on file   Social Drivers of Health   Financial Resource Strain: Low Risk  (05/06/2023)   Overall Financial Resource Strain (CARDIA)    Difficulty of Paying Living Expenses: Not hard at all  Food Insecurity: No Food Insecurity (05/06/2023)   Hunger Vital Sign    Worried About Running Out of Food in the Last Year: Never true    Ran Out of Food in the Last Year: Never true  Transportation Needs: No Transportation Needs (05/06/2023)   PRAPARE - Administrator, Civil Service (Medical): No    Lack of Transportation (Non-Medical): No  Physical Activity: Sufficiently Active (05/06/2023)   Exercise Vital Sign    Days of Exercise per Week: 6 days    Minutes of Exercise per Session: 120 min  Stress: No Stress Concern Present (05/06/2023)   [employer] of Occupational Health - Occupational Stress Questionnaire    Feeling of Stress : Not at all  Social Connections: Moderately Isolated (05/06/2023)   Social Connection and Isolation Panel [NHANES]    Frequency of Communication with Friends and Family: More than three times a week    Frequency of Social Gatherings with Friends and Family: More than  three times a week    Attends Religious Services: Never    Database Administrator or Organizations: Yes    Attends Engineer, Structural: More than 4 times per year    Marital Status: Divorced  Intimate Partner Violence: Not At Risk (05/06/2023)   Humiliation, Afraid, Rape, and Kick questionnaire    Fear of Current or Ex-Partner: No    Emotionally Abused: No    Physically Abused: No    Sexually Abused: No    Physical Exam      Future Appointments  Date Time Provider Department Center  09/27/2023 10:40 AM Georgina Speaks, FNP [facility] None  09/29/2023 10:30 AM Little, Clayborne CROME, RN [facility] None  11/03/2023 10:45 AM Olalere, Adewale A,  MD [facility] None  02/07/2024  8:45 AM Dennise Kingsley, MD [facility]  05/10/2024 11:00 AM [facility]-ANNUAL WELLNESS VISIT [facility] None

## 2023-09-15 ENCOUNTER — Other Ambulatory Visit (HOSPITAL_COMMUNITY): Payer: Self-pay

## 2023-09-16 ENCOUNTER — Other Ambulatory Visit (HOSPITAL_COMMUNITY): Payer: Self-pay | Admitting: Emergency Medicine

## 2023-09-16 NOTE — Progress Notes (Signed)
Paramedicine Encounter    Patient ID: Mark [name], male    DOB: [birth year], 63 y.o.   MRN: [id number]   Complaints NONE  Assessment A&O x 4, skin W&D w/ good color.  Denies chest pain or SOB.  Lung sounds clear bilat.  No peripheral edema noted.  Compliance with meds YES  Pill box filled n/a  Refills needed NONE  Meds changes since last visit  NONE   Social changes NONE   BP 118/78 (BP Location: Left Arm, Patient Position: Sitting, Cuff Size: Normal)   Pulse 64   Resp 14   Wt 243 lb (110.2 kg)   SpO2 98%   BMI 33.89 kg/m  Weight yesterday-not taken Last visit weight-243lb  ACTION: Home visit completed  [name] [phone] 09/16/23  Patient Care Team: [name], FNP as PCP - General (General Practice) O'Neal, Ronnald Ramp, MD as PCP - Cardiology (Cardiology)  Patient Active Problem List   Diagnosis Date Noted   COVID-19 vaccine administered 05/25/2023   Class 2 obesity due to excess calories with body mass index (BMI) of 37.0 to 37.9 in adult 05/25/2023   Chronic gout without tophus 03/31/2023   Hypertensive heart disease with chronic diastolic congestive heart failure (HCC) 01/12/2023   Pulmonary hypertension (HCC) 01/12/2023   Asymptomatic HIV infection, with no history of HIV-related illness (HCC) 01/12/2023   HFrEF (heart failure with reduced ejection fraction) (HCC) 10/16/2022   Chronic respiratory failure with hypoxia (HCC) 10/16/2022   OSA (obstructive sleep apnea) 08/21/2022   Thyroid nodule 01/15/2020   PAD (peripheral artery disease) (HCC) 05/28/2019   Type 2 diabetes mellitus with chronic kidney disease (HCC) 09/26/2018   Obesity (BMI 30-39.9) 09/26/2018   Chronic diastolic heart failure (HCC) 05/07/2018   Cardiomegaly    Encntr for general adult medical exam w/o abnormal findings 04/25/2018   Hearing loss 04/25/2018   LBBB (left bundle branch block) 04/25/2018   Hyperlipidemia 04/25/2018   Other long term (current)  drug therapy 04/25/2018   Other specified disorders of pigmentation 04/25/2018   Personal history of noncompliance with medical treatment, presenting hazards to health 04/25/2018   Sensorineural hearing loss (SNHL) of left ear 06/14/2017   Hypertensive heart disease 06/05/2013   Pure hypercholesterolemia 06/05/2013   Vitamin D deficiency 06/05/2013   Corns and callosity 11/03/2010    Current Outpatient Medications:    allopurinol (ZYLOPRIM) 100 MG tablet, TAKE 1/2 TALBLET (50 MG) BY MOUTH EVERY OTHER DAY FOR GOUT PREVENTION, Disp: 42 tablet, Rfl: 2   amLODipine (NORVASC) 10 MG tablet, Take 1 tablet (10 mg total) by mouth daily., Disp: 30 tablet, Rfl: 2   atorvastatin (LIPITOR) 40 MG tablet, Take 1 tablet (40 mg total) by mouth daily., Disp: 30 tablet, Rfl: 11   bictegravir-emtricitabine-tenofovir AF (BIKTARVY) 50-200-25 MG TABS tablet, Take 1 tablet by mouth daily., Disp: 30 tablet, Rfl: 11   carvedilol (COREG) 3.125 MG tablet, Take 1 tablet (3.125 mg total) by mouth 2 (two) times daily., Disp: 180 tablet, Rfl: 3   empagliflozin (JARDIANCE) 10 MG TABS tablet, TAKE 1 TABLET BY MOUTH EVERY DAY, Disp: 30 tablet, Rfl: 4   Finerenone 10 MG TABS, Take 1 tablet (10 mg total) by mouth daily., Disp: 90 tablet, Rfl: 1   losartan (COZAAR) 100 MG tablet, TAKE 1 TABLET BY MOUTH EVERY DAY, Disp: 90 tablet, Rfl: 1   metFORMIN (GLUCOPHAGE) 1000 MG tablet, TAKE 1 TABLET (1,000 MG TOTAL) BY MOUTH TWICE A DAY WITH FOOD, Disp: 180 tablet, Rfl: 1  spironolactone (ALDACTONE) 25 MG tablet, Take 0.5 tablets (12.5 mg total) by mouth daily., Disp: 45 tablet, Rfl: 3   torsemide (DEMADEX) 20 MG tablet, TAKE 2 TABLETS (40 MG TOTAL) BY MOUTH DAILY., Disp: 180 tablet, Rfl: 1 No Known Allergies   Social History   Socioeconomic History   Marital status: Divorced    Spouse name: Not on file   Number of children: 2   Years of education: Not on file   Highest education level: Not on file  Occupational History    Occupation: self-employed    Comment: recieves disability  Tobacco Use   Smoking status: Former    Current packs/day: 0.00    Types: Cigarettes    Start date: 1998    Quit date: 1999    Years since quitting: 26.0   Smokeless tobacco: Never   Tobacco comments:    1 pack would last one month--08/01/18  Vaping Use   Vaping status: Never Used  Substance and Sexual Activity   Alcohol use: No   Drug use: No   Sexual activity: Not Currently    Partners: Female    Comment: declied condom  Other Topics Concern   Not on file  Social History Narrative   Not on file   Social Drivers of Health   Financial Resource Strain: Low Risk  (05/06/2023)   Overall Financial Resource Strain (CARDIA)    Difficulty of Paying Living Expenses: Not hard at all  Food Insecurity: No Food Insecurity (05/06/2023)   Hunger Vital Sign    Worried About Running Out of Food in the Last Year: Never true    Ran Out of Food in the Last Year: Never true  Transportation Needs: No Transportation Needs (05/06/2023)   PRAPARE - Administrator, Civil Service (Medical): No    Lack of Transportation (Non-Medical): No  Physical Activity: Sufficiently Active (05/06/2023)   Exercise Vital Sign    Days of Exercise per Week: 6 days    Minutes of Exercise per Session: 120 min  Stress: No Stress Concern Present (05/06/2023)   [employer] of Occupational Health - Occupational Stress Questionnaire    Feeling of Stress : Not at all  Social Connections: Moderately Isolated (05/06/2023)   Social Connection and Isolation Panel [NHANES]    Frequency of Communication with Friends and Family: More than three times a week    Frequency of Social Gatherings with Friends and Family: More than three times a week    Attends Religious Services: Never    Database administrator or Organizations: Yes    Attends Engineer, structural: More than 4 times per year    Marital Status: Divorced  Intimate Partner Violence: Not At  Risk (05/06/2023)   Humiliation, Afraid, Rape, and Kick questionnaire    Fear of Current or Ex-Partner: No    Emotionally Abused: No    Physically Abused: No    Sexually Abused: No    Physical Exam      Future Appointments  Date Time Provider Department Center  09/27/2023 10:40 AM [name], FNP [facility] None  09/29/2023 10:30 AM Little, Karma Lew, RN [facility] None  11/03/2023 10:45 AM Tomma Lightning, MD [facility] None  02/07/2024  8:45 AM Danelle Earthly, MD [facility]  05/10/2024 11:00 AM [facility]-ANNUAL WELLNESS VISIT [facility] None

## 2023-09-21 ENCOUNTER — Other Ambulatory Visit (HOSPITAL_COMMUNITY): Payer: Self-pay

## 2023-09-24 ENCOUNTER — Other Ambulatory Visit (HOSPITAL_COMMUNITY): Payer: Self-pay | Admitting: Emergency Medicine

## 2023-09-24 NOTE — Progress Notes (Signed)
Paramedicine Encounter    Patient ID: Mark [name], male    DOB: [birth year], 63 y.o.   MRN: [id number]   Complaints NONE  Assessment A&O x 4, skin W&D w/ good color.  Denies chest pain or SOB.  Lung sounds clear and equal throughout.  No peripheral edema noted.  Compliance with meds YES  Pill box filled n/a  Refills needed NONE  Meds changes since last visit NONE    Social changes NONE   BP 100/68 (BP Location: Left Arm, Patient Position: Sitting, Cuff Size: Normal)   Pulse 68   Resp 16   Wt 243 lb (110.2 kg)   SpO2 97%   BMI 33.89 kg/m  Weight yesterday-not taken Last visit weight-243lb  [name] A&O x 4, skin W&D w/ good color.  Pt states, "I'm feeling great".  He hasn't been going to the gym as much this week due to the cold temperatures. Reviewed med w/ pt and he is compliant w/ all meds. He did express some intermittent "pins and needle" sensation to his toes on both feet.  PMS intact.  He has an appointment coming up w/ his PCP and I recommended he discuss this with her. Next home visit 10/01/23 @ 9:15  ACTION: Home visit completed  [name] [phone] 09/24/23  Patient Care Team: [name], FNP as PCP - General (General Practice) O'Neal, Ronnald Ramp, MD as PCP - Cardiology (Cardiology)  Patient Active Problem List   Diagnosis Date Noted   COVID-19 vaccine administered 05/25/2023   Class 2 obesity due to excess calories with body mass index (BMI) of 37.0 to 37.9 in adult 05/25/2023   Chronic gout without tophus 03/31/2023   Hypertensive heart disease with chronic diastolic congestive heart failure (HCC) 01/12/2023   Pulmonary hypertension (HCC) 01/12/2023   Asymptomatic HIV infection, with no history of HIV-related illness (HCC) 01/12/2023   HFrEF (heart failure with reduced ejection fraction) (HCC) 10/16/2022   Chronic respiratory failure with hypoxia (HCC) 10/16/2022   OSA (obstructive sleep apnea) 08/21/2022    Thyroid nodule 01/15/2020   PAD (peripheral artery disease) (HCC) 05/28/2019   Type 2 diabetes mellitus with chronic kidney disease (HCC) 09/26/2018   Obesity (BMI 30-39.9) 09/26/2018   Chronic diastolic heart failure (HCC) 05/07/2018   Cardiomegaly    Encntr for general adult medical exam w/o abnormal findings 04/25/2018   Hearing loss 04/25/2018   LBBB (left bundle branch block) 04/25/2018   Hyperlipidemia 04/25/2018   Other long term (current) drug therapy 04/25/2018   Other specified disorders of pigmentation 04/25/2018   Personal history of noncompliance with medical treatment, presenting hazards to health 04/25/2018   Sensorineural hearing loss (SNHL) of left ear 06/14/2017   Hypertensive heart disease 06/05/2013   Pure hypercholesterolemia 06/05/2013   Vitamin D deficiency 06/05/2013   Corns and callosity 11/03/2010    Current Outpatient Medications:    allopurinol (ZYLOPRIM) 100 MG tablet, TAKE 1/2 TALBLET (50 MG) BY MOUTH EVERY OTHER DAY FOR GOUT PREVENTION, Disp: 42 tablet, Rfl: 2   amLODipine (NORVASC) 10 MG tablet, Take 1 tablet (10 mg total) by mouth daily., Disp: 30 tablet, Rfl: 2   atorvastatin (LIPITOR) 40 MG tablet, Take 1 tablet (40 mg total) by mouth daily., Disp: 30 tablet, Rfl: 11   bictegravir-emtricitabine-tenofovir AF (BIKTARVY) 50-200-25 MG TABS tablet, Take 1 tablet by mouth daily., Disp: 30 tablet, Rfl: 11   carvedilol (COREG) 3.125 MG tablet, Take 1 tablet (3.125 mg total) by mouth 2 (two) times daily.,  Disp: 180 tablet, Rfl: 3   empagliflozin (JARDIANCE) 10 MG TABS tablet, TAKE 1 TABLET BY MOUTH EVERY DAY, Disp: 30 tablet, Rfl: 4   Finerenone 10 MG TABS, Take 1 tablet (10 mg total) by mouth daily., Disp: 90 tablet, Rfl: 1   losartan (COZAAR) 100 MG tablet, TAKE 1 TABLET BY MOUTH EVERY DAY, Disp: 90 tablet, Rfl: 1   metFORMIN (GLUCOPHAGE) 1000 MG tablet, TAKE 1 TABLET (1,000 MG TOTAL) BY MOUTH TWICE A DAY WITH FOOD, Disp: 180 tablet, Rfl: 1   spironolactone  (ALDACTONE) 25 MG tablet, Take 0.5 tablets (12.5 mg total) by mouth daily., Disp: 45 tablet, Rfl: 3   torsemide (DEMADEX) 20 MG tablet, TAKE 2 TABLETS (40 MG TOTAL) BY MOUTH DAILY., Disp: 180 tablet, Rfl: 1 No Known Allergies   Social History   Socioeconomic History   Marital status: Divorced    Spouse name: Not on file   Number of children: 2   Years of education: Not on file   Highest education level: Not on file  Occupational History   Occupation: self-employed    Comment: recieves disability  Tobacco Use   Smoking status: Former    Current packs/day: 0.00    Types: Cigarettes    Start date: 1998    Quit date: 1999    Years since quitting: 26.0   Smokeless tobacco: Never   Tobacco comments:    1 pack would last one month--08/01/18  Vaping Use   Vaping status: Never Used  Substance and Sexual Activity   Alcohol use: No   Drug use: No   Sexual activity: Not Currently    Partners: Female    Comment: declied condom  Other Topics Concern   Not on file  Social History Narrative   Not on file   Social Drivers of Health   Financial Resource Strain: Low Risk  (05/06/2023)   Overall Financial Resource Strain (CARDIA)    Difficulty of Paying Living Expenses: Not hard at all  Food Insecurity: No Food Insecurity (05/06/2023)   Hunger Vital Sign    Worried About Running Out of Food in the Last Year: Never true    Ran Out of Food in the Last Year: Never true  Transportation Needs: No Transportation Needs (05/06/2023)   PRAPARE - Administrator, Civil Service (Medical): No    Lack of Transportation (Non-Medical): No  Physical Activity: Sufficiently Active (05/06/2023)   Exercise Vital Sign    Days of Exercise per Week: 6 days    Minutes of Exercise per Session: 120 min  Stress: No Stress Concern Present (05/06/2023)   [employer] of Occupational Health - Occupational Stress Questionnaire    Feeling of Stress : Not at all  Social Connections: Moderately Isolated  (05/06/2023)   Social Connection and Isolation Panel [NHANES]    Frequency of Communication with Friends and Family: More than three times a week    Frequency of Social Gatherings with Friends and Family: More than three times a week    Attends Religious Services: Never    Database administrator or Organizations: Yes    Attends Engineer, structural: More than 4 times per year    Marital Status: Divorced  Intimate Partner Violence: Not At Risk (05/06/2023)   Humiliation, Afraid, Rape, and Kick questionnaire    Fear of Current or Ex-Partner: No    Emotionally Abused: No    Physically Abused: No    Sexually Abused: No    Physical Exam  Future Appointments  Date Time Provider Department Center  09/27/2023 10:40 AM [name], FNP [facility] None  09/29/2023 10:30 AM Little, Karma Lew, RN [facility] None  11/03/2023 10:45 AM Tomma Lightning, MD [facility] None  02/08/2024  8:45 AM Danelle Earthly, MD [facility]  05/10/2024 11:00 AM [facility]-ANNUAL WELLNESS VISIT [facility] None

## 2023-09-27 ENCOUNTER — Encounter: Payer: Self-pay | Admitting: Nurse Practitioner

## 2023-09-27 ENCOUNTER — Ambulatory Visit (INDEPENDENT_AMBULATORY_CARE_PROVIDER_SITE_OTHER): Payer: Medicare HMO | Admitting: Nurse Practitioner

## 2023-09-27 VITALS — BP 126/74 | HR 66 | Temp 97.7°F | Ht 71.0 in | Wt 243.0 lb

## 2023-09-27 DIAGNOSIS — I272 Pulmonary hypertension, unspecified: Secondary | ICD-10-CM | POA: Diagnosis not present

## 2023-09-27 DIAGNOSIS — B20 Human immunodeficiency virus [HIV] disease: Secondary | ICD-10-CM | POA: Diagnosis not present

## 2023-09-27 DIAGNOSIS — E6609 Other obesity due to excess calories: Secondary | ICD-10-CM

## 2023-09-27 DIAGNOSIS — E782 Mixed hyperlipidemia: Secondary | ICD-10-CM | POA: Diagnosis not present

## 2023-09-27 DIAGNOSIS — J9611 Chronic respiratory failure with hypoxia: Secondary | ICD-10-CM | POA: Diagnosis not present

## 2023-09-27 DIAGNOSIS — Z23 Encounter for immunization: Secondary | ICD-10-CM

## 2023-09-27 DIAGNOSIS — E66811 Obesity, class 1: Secondary | ICD-10-CM | POA: Diagnosis not present

## 2023-09-27 DIAGNOSIS — N1832 Chronic kidney disease, stage 3b: Secondary | ICD-10-CM

## 2023-09-27 DIAGNOSIS — E1122 Type 2 diabetes mellitus with diabetic chronic kidney disease: Secondary | ICD-10-CM | POA: Diagnosis not present

## 2023-09-27 DIAGNOSIS — Z6833 Body mass index (BMI) 33.0-33.9, adult: Secondary | ICD-10-CM

## 2023-09-27 DIAGNOSIS — Z794 Long term (current) use of insulin: Secondary | ICD-10-CM | POA: Diagnosis not present

## 2023-09-27 DIAGNOSIS — Z2821 Immunization not carried out because of patient refusal: Secondary | ICD-10-CM | POA: Insufficient documentation

## 2023-09-27 NOTE — Assessment & Plan Note (Signed)
he is encouraged to strive for BMI less than 30 to decrease cardiac risk. Continue to exercise regularly aiming for at least 150 minutes of exercise per week.

## 2023-09-27 NOTE — Assessment & Plan Note (Signed)
hgbA1c is stable. Continue current medications.

## 2023-09-27 NOTE — Assessment & Plan Note (Signed)
Covid 19 vaccine given in office observed for 15 minutes without any adverse reaction

## 2023-09-27 NOTE — Assessment & Plan Note (Signed)
Blood pressure is controlled, repeat had improved blood pressure. Continue f/u with Cardiology.

## 2023-09-27 NOTE — Progress Notes (Signed)
[name], CMA,acting as a Neurosurgeon for [name], FNP.,have documented all relevant documentation on the behalf of [name], FNP,as directed by  [name], FNP while in the presence of [name], FNP.  Subjective:  Patient ID: Mark [name] , male    DOB: [birth year] , 63 y.o.   MRN: [id number]  Chief Complaint  Patient presents with   Hypertension    HPI  Patient presents today for a bp and dm follow up, Patient reports compliance with medication. Patient denies any chest pain, SOB, or headaches. Patient has no concerns today.      Past Medical History:  Diagnosis Date   Abnormal lung function test    a. Reported to possibly be COPD, but per pulm note: "Possible COPD - PFT was more suggestive of restrictive defect and diffusion defect likely from obesity and CHF"   CHF (congestive heart failure) (HCC)    Chronic diastolic heart failure (HCC) 05/07/2018   CKD (chronic kidney disease), stage III (HCC)    Diabetes mellitus type 2 in obese    Elevated troponin 05/07/2018   Hearing impaired    Hyperlipidemia    Hypertension    LBBB (left bundle branch block)    LVH (left ventricular hypertrophy)    Mild aortic stenosis    Mild pulmonary hypertension (HCC)    Morbid obesity (HCC)    Pulmonary hypertension (HCC)    Sleep apnea      Family History  Problem Relation Age of Onset   Hypertension Mother    Hypertension Father      Current Outpatient Medications:    allopurinol (ZYLOPRIM) 100 MG tablet, TAKE 1/2 TALBLET (50 MG) BY MOUTH EVERY OTHER DAY FOR GOUT PREVENTION, Disp: 42 tablet, Rfl: 2   amLODipine (NORVASC) 10 MG tablet, Take 1 tablet (10 mg total) by mouth daily., Disp: 30 tablet, Rfl: 2   atorvastatin (LIPITOR) 40 MG tablet, Take 1 tablet (40 mg total) by mouth daily., Disp: 30 tablet, Rfl: 11   bictegravir-emtricitabine-tenofovir AF (BIKTARVY) 50-200-25 MG TABS tablet, Take 1 tablet by mouth daily., Disp: 30 tablet, Rfl: 11   carvedilol (COREG)  3.125 MG tablet, Take 1 tablet (3.125 mg total) by mouth 2 (two) times daily., Disp: 180 tablet, Rfl: 3   empagliflozin (JARDIANCE) 10 MG TABS tablet, TAKE 1 TABLET BY MOUTH EVERY DAY, Disp: 30 tablet, Rfl: 4   Finerenone 10 MG TABS, Take 1 tablet (10 mg total) by mouth daily., Disp: 90 tablet, Rfl: 1   losartan (COZAAR) 100 MG tablet, TAKE 1 TABLET BY MOUTH EVERY DAY, Disp: 90 tablet, Rfl: 1   metFORMIN (GLUCOPHAGE) 1000 MG tablet, TAKE 1 TABLET (1,000 MG TOTAL) BY MOUTH TWICE A DAY WITH FOOD, Disp: 180 tablet, Rfl: 1   spironolactone (ALDACTONE) 25 MG tablet, Take 0.5 tablets (12.5 mg total) by mouth daily., Disp: 45 tablet, Rfl: 3   torsemide (DEMADEX) 20 MG tablet, TAKE 2 TABLETS (40 MG TOTAL) BY MOUTH DAILY., Disp: 180 tablet, Rfl: 1   No Known Allergies   Review of Systems  Constitutional: Negative.   Respiratory: Negative.    Cardiovascular: Negative.   Neurological: Negative.   Psychiatric/Behavioral: Negative.       Today's Vitals   09/27/23 1017 09/27/23 1110  BP: 130/80 126/74  Pulse: 66   Temp: 97.7 F (36.5 C)   TempSrc: Oral   Weight: 243 lb (110.2 kg)   Height: 5\' 11"  (1.803 m)   PainSc: 0-No pain    Body  mass index is 33.89 kg/m.  Wt Readings from Last 3 Encounters:  09/27/23 243 lb (110.2 kg)  09/24/23 243 lb (110.2 kg)  09/16/23 243 lb (110.2 kg)     Objective:  Physical Exam Vitals reviewed.  Constitutional:      General: He is not in acute distress.    Appearance: Normal appearance. He is obese.  Cardiovascular:     Rate and Rhythm: Normal rate and regular rhythm.     Pulses: Normal pulses.     Heart sounds: Normal heart sounds. No murmur heard. Pulmonary:     Effort: Pulmonary effort is normal. No respiratory distress.     Breath sounds: Normal breath sounds. No wheezing.  Skin:    General: Skin is warm and dry.     Capillary Refill: Capillary refill takes less than 2 seconds.  Neurological:     General: No focal deficit present.     Mental  Status: He is alert and oriented to person, place, and time.     Cranial Nerves: No cranial nerve deficit.     Motor: No weakness.  Psychiatric:        Mood and Affect: Mood normal.        Behavior: Behavior normal.        Thought Content: Thought content normal.        Judgment: Judgment normal.      Title   Diabetic Foot Exam - detailed Date & Time: 09/27/2023 11:15 AM Diabetic Foot exam was performed with the following findings: Yes  Visual Foot Exam completed.: Yes  Is there a history of foot ulcer?: No Is there a foot ulcer now?: No Is there swelling?: No Is there elevated skin temperature?: No Is there abnormal foot shape?: No Is there a claw toe deformity?: No Are the toenails long?: No Are the toenails thick?: No Are the toenails ingrown?: No Is the skin thin, fragile, shiny and hairless?": No Normal Range of Motion?: Yes Is there foot or ankle muscle weakness?: No Do you have pain in calf while walking?: No Are the shoes appropriate in style and fit?: Yes Can the patient see the bottom of their feet?: Yes Pulse Foot Exam completed.: Yes   Right Posterior Tibialis: Present Left posterior Tibialis: Present   Right Dorsalis Pedis: Present Left Dorsalis Pedis: Present     Sensory Foot Exam Completed.: Yes Semmes-Weinstein Monofilament Test "+" means "has sensation" and "-" means "no sensation"   R Site 1-Great Toe: Pos L Site 1-Great Toe: Pos   R Site 4: Pos L Site 4: Pos   R Site 6: Pos L Site 6: Pos     Image components are not supported.   Image components are not supported. Image components are not supported.  Tuning [place] Comments Tingling sensation bilateral feet     Assessment And Plan:  Pulmonary hypertension (HCC) [I27.20] Assessment & Plan: Blood pressure is controlled, repeat had improved blood pressure. Continue f/u with Cardiology.   Orders: -     BMP8+eGFR  Type 2 diabetes mellitus with stage 3b chronic kidney disease, with long-term  current use of insulin (HCC) Assessment & Plan: hgbA1c is stable. Continue current medications.   Orders: -     Hemoglobin A1c  Mixed hyperlipidemia Assessment & Plan: Cholesterol levels are stable, continue statin. Tolerating well.    COVID-19 vaccine administered Assessment & Plan: Covid 19 vaccine given in office observed for 15 minutes without any adverse reaction   Orders: -  Pfizer Comirnaty Covid-19 Vaccine 91yrs & older  Chronic respiratory failure with hypoxia (HCC) Assessment & Plan: Continue oxygen at 2 l/m.    HIV disease (HCC) Assessment & Plan: Continue f/u with Infectious Disease   Class 1 obesity due to excess calories without serious comorbidity with body mass index (BMI) of 33.0 to 33.9 in adult Assessment & Plan: he is encouraged to strive for BMI less than 30 to decrease cardiac risk. Continue to exercise regularly aiming for at least 150 minutes of exercise per week.    Herpes zoster vaccination declined Assessment & Plan: Declines shingrix, educated on disease process and is aware if he changes his mind to notify office    Tetanus, diphtheria, and acellular pertussis (Tdap) vaccination declined    Return for controlled DM check 4 months.  Patient was given opportunity to ask questions. Patient verbalized understanding of the plan and was able to repeat key elements of the plan. All questions were answered to their satisfaction.    Jeanell Sparrow, FNP, have reviewed all documentation for this visit. The documentation on 09/27/23 for the exam, diagnosis, procedures, and orders are all accurate and complete.   IF YOU HAVE BEEN REFERRED TO A SPECIALIST, IT MAY TAKE 1-2 WEEKS TO SCHEDULE/PROCESS THE REFERRAL. IF YOU HAVE NOT HEARD FROM US/SPECIALIST IN TWO WEEKS, PLEASE GIVE [place] A CALL AT [phone].

## 2023-09-27 NOTE — Assessment & Plan Note (Signed)
Cholesterol levels are stable, continue statin. Tolerating well.

## 2023-09-27 NOTE — Assessment & Plan Note (Signed)
Declines shingrix, educated on disease process and is aware if he changes his mind to notify office

## 2023-09-27 NOTE — Assessment & Plan Note (Signed)
Continue f/u with Infectious Disease

## 2023-09-27 NOTE — Assessment & Plan Note (Signed)
Continue oxygen at 2 l/m.

## 2023-09-27 NOTE — Patient Instructions (Addendum)
You can get neuropathy cream over the counter to help with the tingling in your feet.  You need to wear bedroom shoes when walking around the house.  Check your feet daily or every other day for any open areas Keep your feet moisturized.

## 2023-09-28 LAB — BMP8+EGFR
BUN/Creatinine Ratio: 18 (ref 10–24)
BUN: 44 mg/dL — ABNORMAL HIGH (ref 8–27)
CO2: 23 mmol/L (ref 20–29)
Calcium: 10.1 mg/dL (ref 8.6–10.2)
Chloride: 104 mmol/L (ref 96–106)
Creatinine, Ser: 2.4 mg/dL — ABNORMAL HIGH (ref 0.76–1.27)
Glucose: 90 mg/dL (ref 70–99)
Potassium: 5.3 mmol/L — ABNORMAL HIGH (ref 3.5–5.2)
Sodium: 145 mmol/L — ABNORMAL HIGH (ref 134–144)
eGFR: 29 mL/min/{1.73_m2} — ABNORMAL LOW (ref >59)

## 2023-09-28 LAB — HEMOGLOBIN A1C
Est. average glucose Bld gHb Est-mCnc: 117 mg/dL
Hgb A1c MFr Bld: 5.7 % — ABNORMAL HIGH (ref 4.8–5.6)

## 2023-09-29 ENCOUNTER — Ambulatory Visit: Payer: Self-pay

## 2023-09-29 NOTE — Patient Outreach (Signed)
  Care Coordination   09/29/2023 Name: Mark [name] MRN: [id number] DOB: 06-13-1961   Care Coordination Outreach Attempts:  An unsuccessful outreach was attempted for an appointment today.  Follow Up Plan:  Additional outreach attempts will be made to offer the patient complex care management information and services.   Encounter Outcome:  No Answer   Care Coordination Interventions:  No, not indicated    Delsa Sale RN BSN CCM [place]  [facility], [facility] Nurse Care Coordinator  Direct Dial: [phone] Website: [email or web address]

## 2023-09-29 NOTE — Patient Outreach (Addendum)
Care Coordination   Follow Up Visit Note   09/29/2023 Name: ARMOND [name] MRN: [id number] DOB: 1961/03/31  Mark [name] is a 63 y.o. year old male who sees Mark Felts, FNP for primary care. I spoke with  Mark [name] by phone today.  What matters to the patients health and wellness today?  Patient would like to continue to stay physically active to help keep him healthy. Patient would like to work on increasing his daily water intake to help improve his kidney function.     Goals Addressed               This Visit's Progress     Patient Stated     To manage heart failure (pt-stated)   On track     Care Coordination Interventions: Reviewed Heart Failure Action Plan in depth and provided written copy Advised patient to weigh each morning after emptying bladder Discussed importance of daily weight and advised patient to weigh and record daily Reviewed role of diuretics in prevention of fluid overload and management of heart failure; Discussed the importance of keeping all appointments with provider Patient will continue to weight daily as directed and record his weights Patient will notify his heart failure team of new symptoms or concerns per his heart failure action plan Patient will continue to follow a low sodium diet and balance his activity with rest Patient will continue to work with nurse care coordinator for chronic disease management and care coordination      Other     COMPLETED: I would like to lose weight and be more physically active        Care Coordination Interventions: Advised patient to discuss with primary care provider options regarding weight management Discussed with patient he continues to remain physically active by attending the gym daily Patient feels he is doing a good job managing his current weight Positive reinforcement given to patient for making efforts to improve his overall health      COMPLETED: To complete a sleep study to  diagnose obstructive sleep apnea        Care Coordination Interventions: Evaluation of current treatment plan related to OSA w/CPAP and patient's adherence to plan as established by provider Confirmed with patient he received his CPAP machine and is adhering 100%, he reports feeling better, he is getting a full nights sleep and is staying physically active by going to the gym daily Positive reinforcement given to patient for making efforts to improve his health Reviewed and discussed with patient his upcoming scheduled follow up with [facility] is scheduled for 11/03/23 @10 :45 AM for a 3 month follow up       To improve kidney function   On track     Care Coordination Interventions: Assessed the Patient understanding of chronic kidney disease    Evaluation of current treatment plan related to chronic kidney disease self management and patient's adherence to plan as established by provider      Reviewed prescribed diet increase daily water intake to 48-64 oz daily unless otherwise directed by your doctor  Provided education on kidney disease progression    Last practice recorded BP readings:  BP Readings from Last 3 Encounters:  09/27/23 126/74  09/24/23 100/68  09/16/23 118/78   Most recent eGFR/CrCl:  Lab Results  Component Value Date   EGFR 29 (L) 09/27/2023    No components found for: "CRCL"  Patient will increase his daily water intake to 48-64 oz daily unless  otherwise directed  Patient will continue to take prescribed medications as directed  Patient will continue to manage his blood pressure and diabetes to help optimize his kidney function  Patient will continue to work with nurse care coordinator for chronic disease management and care coordination needs        Interventions Today    Flowsheet Row Most Recent Value  Chronic Disease   Chronic disease during today's visit Diabetes, Chronic Kidney Disease/End Stage Renal Disease (ESRD), Other  [OSA w/cpap usage]   General Interventions   General Interventions Discussed/Reviewed General Interventions Discussed, General Interventions Reviewed, Doctor Visits, Labs, Durable Medical Equipment (DME)  Doctor Visits Discussed/Reviewed Doctor Visits Discussed, Doctor Visits Reviewed, PCP, Specialist  Durable Medical Equipment (DME) Other  [CPAP]  Exercise Interventions   Exercise Discussed/Reviewed Exercise Discussed, Exercise Reviewed, Physical Activity  Physical Activity Discussed/Reviewed Physical Activity Discussed, Physical Activity Reviewed, Types of exercise, Gym  Education Interventions   Education Provided Provided Education  Provided Verbal Education On Labs, Nutrition, Medication, Exercise, When to see the doctor  Labs Reviewed Hgb A1c, Kidney Function  Nutrition Interventions   Nutrition Discussed/Reviewed Nutrition Discussed, Nutrition Reviewed, Carbohydrate meal planning, Portion sizes, Fluid intake  Pharmacy Interventions   Pharmacy Dicussed/Reviewed Pharmacy Topics Reviewed, Pharmacy Topics Discussed, Medications and their functions          SDOH assessments and interventions completed:  Yes  SDOH Interventions Today    Flowsheet Row Most Recent Value  SDOH Interventions   Food Insecurity Interventions Intervention Not Indicated  Housing Interventions Intervention Not Indicated  Transportation Interventions Intervention Not Indicated  Utilities Interventions Intervention Not Indicated        Care Coordination Interventions:  Yes, provided   Follow up plan: Follow up call scheduled for 01/27/24 @10 :30 AM    Encounter Outcome:  Patient Visit Completed

## 2023-09-29 NOTE — Patient Instructions (Signed)
Visit Information  Thank you for taking time to visit with me today. Please don't hesitate to contact me if I can be of assistance to you.   Following are the goals we discussed today:   Goals Addressed               This Visit's Progress     Patient Stated     To manage heart failure (pt-stated)   On track     Care Coordination Interventions: Reviewed Heart Failure Action Plan in depth and provided written copy Advised patient to weigh each morning after emptying bladder Discussed importance of daily weight and advised patient to weigh and record daily Reviewed role of diuretics in prevention of fluid overload and management of heart failure; Discussed the importance of keeping all appointments with provider Patient will continue to weight daily as directed and record his weights Patient will notify his heart failure team of new symptoms or concerns per his heart failure action plan Patient will continue to follow a low sodium diet and balance his activity with rest Patient will continue to work with nurse care coordinator for chronic disease management and care coordination         Other     COMPLETED: I would like to lose weight and be more physically active        Care Coordination Interventions: Advised patient to discuss with primary care provider options regarding weight management Discussed with patient he continues to remain physically active by attending the gym daily Patient feels he is doing a good job managing his current weight Positive reinforcement given to patient for making efforts to improve his overall health      COMPLETED: To complete a sleep study to diagnose obstructive sleep apnea        Care Coordination Interventions: Evaluation of current treatment plan related to OSA w/CPAP and patient's adherence to plan as established by provider Confirmed with patient he received his CPAP machine and is adhering 100%, he reports feeling better, he is getting a  full nights sleep and is staying physically active by going to the gym daily Positive reinforcement given to patient for making efforts to improve his health Reviewed and discussed with patient his upcoming scheduled follow up with [facility] is scheduled for 11/03/23 @10 :45 AM for a 3 month follow up       To improve kidney function   On track     Care Coordination Interventions: Assessed the Patient understanding of chronic kidney disease    Evaluation of current treatment plan related to chronic kidney disease self management and patient's adherence to plan as established by provider      Reviewed prescribed diet increase daily water intake to 48-64 oz daily unless otherwise directed by your doctor  Provided education on kidney disease progression    Last practice recorded BP readings:  BP Readings from Last 3 Encounters:  09/27/23 126/74  09/24/23 100/68  09/16/23 118/78   Most recent eGFR/CrCl:  Lab Results  Component Value Date   EGFR 29 (L) 09/27/2023    No components found for: "CRCL"  Patient will increase his daily water intake to 48-64 oz daily unless otherwise directed  Patient will continue to take prescribed medications as directed  Patient will continue to manage his blood pressure and diabetes to help optimize his kidney function  Patient will continue to work with nurse care coordinator for chronic disease management and care coordination needs  Our next appointment is by telephone on 01/27/24 at 10:30 AM  Please call the care guide team at [phone] if you need to cancel or reschedule your appointment.   If you are experiencing a Mental Health or Behavioral Health Crisis or need someone to talk to, please call 1-800-273-TALK (toll free, 24 hour hotline)  The patient verbalized understanding of instructions, educational materials, and care plan provided today and DECLINED offer to receive copy of patient instructions, educational materials,  and care plan.   Delsa Sale RN BSN CCM [place]  [facility], [facility] Nurse Care Coordinator  Direct Dial: [phone] Website: [email or web address]

## 2023-10-01 ENCOUNTER — Other Ambulatory Visit (HOSPITAL_COMMUNITY): Payer: Self-pay | Admitting: Emergency Medicine

## 2023-10-01 NOTE — Progress Notes (Signed)
Paramedicine Encounter    Patient ID: Mark [name], male    DOB: Jan 24, 1961, 63 y.o.   MRN: [id number]   Complaints NONE  Assessment A & O x 4, skin W&D w/ good color.  Denies chest pain and SOB.  Lung sounds clear bilat.  No peripheral edema noted.  Compliance with meds YES  Pill box filled n/a  Refills needed none  Meds changes since last visit NONE    Social changes NONE   BP 110/78 (BP Location: Left Arm, Patient Position: Sitting, Cuff Size: Normal)   Pulse 62   Resp 14   Wt 242 lb (109.8 kg)   SpO2 96%   BMI 33.75 kg/m  Weight yesterday- not taken Last visit weight-243lb  ATF Mr. Murri A&O x 4, skin W&D w/ good color.  Pt reports to be feeling well.  No chest pain or SOB.  Lung sounds clear and equal throughout and no peripheral edema noted.   Pt does well w/ med compliance.  No refills needed at this time. Next appt. 10/08/23 @ 9:00  ACTION: Home visit completed  [name] [phone] 10/01/23  Patient Care Team: [name], FNP as PCP - General (General Practice) O'Neal, Ronnald Ramp, MD as PCP - Cardiology (Cardiology) Riley Churches, RN as [facility] Care Management  Patient Active Problem List   Diagnosis Date Noted   HIV disease (HCC) 09/27/2023   Herpes zoster vaccination declined 09/27/2023   COVID-19 vaccine administered 05/25/2023   Class 1 obesity due to excess calories without serious comorbidity with body mass index (BMI) of 33.0 to 33.9 in adult 05/25/2023   Chronic gout without tophus 03/31/2023   Hypertensive heart disease with chronic diastolic congestive heart failure (HCC) 01/12/2023   Pulmonary hypertension (HCC) 01/12/2023   Asymptomatic HIV infection, with no history of HIV-related illness (HCC) 01/12/2023   HFrEF (heart failure with reduced ejection fraction) (HCC) 10/16/2022   Chronic respiratory failure with hypoxia (HCC) 10/16/2022   OSA (obstructive sleep apnea) 08/21/2022   Thyroid nodule 01/15/2020    PAD (peripheral artery disease) (HCC) 05/28/2019   Type 2 diabetes mellitus with chronic kidney disease (HCC) 09/26/2018   Obesity (BMI 30-39.9) 09/26/2018   Chronic diastolic heart failure (HCC) 05/07/2018   Cardiomegaly    Encntr for general adult medical exam w/o abnormal findings 04/25/2018   Hearing loss 04/25/2018   LBBB (left bundle branch block) 04/25/2018   Hyperlipidemia 04/25/2018   Other long term (current) drug therapy 04/25/2018   Other specified disorders of pigmentation 04/25/2018   Personal history of noncompliance with medical treatment, presenting hazards to health 04/25/2018   Sensorineural hearing loss (SNHL) of left ear 06/14/2017   Hypertensive heart disease 06/05/2013   Pure hypercholesterolemia 06/05/2013   Vitamin D deficiency 06/05/2013   Corns and callosity 11/03/2010    Current Outpatient Medications:    allopurinol (ZYLOPRIM) 100 MG tablet, TAKE 1/2 TALBLET (50 MG) BY MOUTH EVERY OTHER DAY FOR GOUT PREVENTION, Disp: 42 tablet, Rfl: 2   amLODipine (NORVASC) 10 MG tablet, Take 1 tablet (10 mg total) by mouth daily., Disp: 30 tablet, Rfl: 2   atorvastatin (LIPITOR) 40 MG tablet, Take 1 tablet (40 mg total) by mouth daily., Disp: 30 tablet, Rfl: 11   bictegravir-emtricitabine-tenofovir AF (BIKTARVY) 50-200-25 MG TABS tablet, Take 1 tablet by mouth daily., Disp: 30 tablet, Rfl: 11   carvedilol (COREG) 3.125 MG tablet, Take 1 tablet (3.125 mg total) by mouth 2 (two) times daily., Disp: 180 tablet, Rfl: 3  empagliflozin (JARDIANCE) 10 MG TABS tablet, TAKE 1 TABLET BY MOUTH EVERY DAY, Disp: 30 tablet, Rfl: 4   Finerenone 10 MG TABS, Take 1 tablet (10 mg total) by mouth daily., Disp: 90 tablet, Rfl: 1   losartan (COZAAR) 100 MG tablet, TAKE 1 TABLET BY MOUTH EVERY DAY, Disp: 90 tablet, Rfl: 1   metFORMIN (GLUCOPHAGE) 1000 MG tablet, TAKE 1 TABLET (1,000 MG TOTAL) BY MOUTH TWICE A DAY WITH FOOD, Disp: 180 tablet, Rfl: 1   spironolactone (ALDACTONE) 25 MG tablet, Take  0.5 tablets (12.5 mg total) by mouth daily., Disp: 45 tablet, Rfl: 3   torsemide (DEMADEX) 20 MG tablet, TAKE 2 TABLETS (40 MG TOTAL) BY MOUTH DAILY., Disp: 180 tablet, Rfl: 1 No Known Allergies   Social History   Socioeconomic History   Marital status: Divorced    Spouse name: Not on file   Number of children: 2   Years of education: Not on file   Highest education level: Not on file  Occupational History   Occupation: self-employed    Comment: recieves disability  Tobacco Use   Smoking status: Former    Current packs/day: 0.00    Types: Cigarettes    Start date: 1998    Quit date: 1999    Years since quitting: 26.1   Smokeless tobacco: Never   Tobacco comments:    1 pack would last one month--08/01/18  Vaping Use   Vaping status: Never Used  Substance and Sexual Activity   Alcohol use: No   Drug use: No   Sexual activity: Not Currently    Partners: Female    Comment: declied condom  Other Topics Concern   Not on file  Social History Narrative   Not on file   Social Drivers of Health   Financial Resource Strain: Low Risk  (05/06/2023)   Overall Financial Resource Strain (CARDIA)    Difficulty of Paying Living Expenses: Not hard at all  Food Insecurity: No Food Insecurity (09/29/2023)   Hunger Vital Sign    Worried About Running Out of Food in the Last Year: Never true    Ran Out of Food in the Last Year: Never true  Transportation Needs: No Transportation Needs (09/29/2023)   PRAPARE - Administrator, Civil Service (Medical): No    Lack of Transportation (Non-Medical): No  Physical Activity: Sufficiently Active (05/06/2023)   Exercise Vital Sign    Days of Exercise per Week: 6 days    Minutes of Exercise per Session: 120 min  Stress: No Stress Concern Present (05/06/2023)   [employer] of Occupational Health - Occupational Stress Questionnaire    Feeling of Stress : Not at all  Social Connections: Moderately Isolated (05/06/2023)   Social Connection  and Isolation Panel [NHANES]    Frequency of Communication with Friends and Family: More than three times a week    Frequency of Social Gatherings with Friends and Family: More than three times a week    Attends Religious Services: Never    Database administrator or Organizations: Yes    Attends Engineer, structural: More than 4 times per year    Marital Status: Divorced  Intimate Partner Violence: Not At Risk (09/29/2023)   Humiliation, Afraid, Rape, and Kick questionnaire    Fear of Current or Ex-Partner: No    Emotionally Abused: No    Physically Abused: No    Sexually Abused: No    Physical Exam      Future Appointments  Date Time Provider Department Center  11/03/2023 10:45 AM Tomma Lightning, MD [facility] None  01/25/2024 10:20 AM [name], FNP [facility] None  01/27/2024 10:30 AM Riley Churches, RN [facility] None  02/08/2024  8:45 AM Danelle Earthly, MD [facility]  05/10/2024 11:00 AM [facility]-ANNUAL WELLNESS VISIT [facility] None

## 2023-10-08 ENCOUNTER — Other Ambulatory Visit (HOSPITAL_COMMUNITY): Payer: Self-pay | Admitting: Emergency Medicine

## 2023-10-08 NOTE — Progress Notes (Signed)
 Paramedicine Encounter    Patient ID: Mark [name] [place], male    DOB: May 18, 1961, 63 y.o.   MRN: [id number]   Complaints NONE  Assessment A&O x 4, skin W&D w/ good color.  Denies chest pain or SOB.  Lung sounds clear and equal bilat.  No peripheral edema noted  Compliance with meds YES  Pill box filled n/a   Refills needed NONE  Meds changes since last visit NONE    Social changes NONE   BP 100/70 (BP Location: Left Arm, Patient Position: Sitting, Cuff Size: Normal)   Pulse 63   Resp 14   Wt 242 lb 9.6 oz (110 kg)   SpO2 98%   BMI 33.84 kg/m  Weight yesterday-not taken Last visit weight-242lb  ATF Mr [place] A&O x 4, skin W&D w/ good color.   Denies chest pain or SOB.  Lung sounds clear and equal bilat. No peripheral edema.  Pt doing well w/ med compliance.  All meds reviewed and no refills needed at this time.  Next home visit 10/15/23 @ 9:00  ACTION: Home visit completed   Mary Claudene Kennel [phone] 10/08/23  Patient Care Team: Georgina Speaks, FNP as PCP - General (General Practice) O'Neal, Darryle Ned, MD as PCP - Cardiology (Cardiology) Morgan Clayborne CROME, RN as [facility] Care Management  Patient Active Problem List   Diagnosis Date Noted   HIV disease (HCC) 09/27/2023   Herpes zoster vaccination declined 09/27/2023   COVID-19 vaccine administered 05/25/2023   Class 1 obesity due to excess calories without serious comorbidity with body mass index (BMI) of 33.0 to 33.9 in adult 05/25/2023   Chronic gout without tophus 03/31/2023   Hypertensive heart disease with chronic diastolic congestive heart failure (HCC) 01/12/2023   Pulmonary hypertension (HCC) 01/12/2023   Asymptomatic HIV infection, with no history of HIV-related illness (HCC) 01/12/2023   HFrEF (heart failure with reduced ejection fraction) (HCC) 10/16/2022   Chronic respiratory failure with hypoxia (HCC) 10/16/2022   OSA (obstructive sleep apnea) 08/21/2022   Thyroid  nodule 01/15/2020    PAD (peripheral artery disease) (HCC) 05/28/2019   Type 2 diabetes mellitus with chronic kidney disease (HCC) 09/26/2018   Obesity (BMI 30-39.9) 09/26/2018   Chronic diastolic heart failure (HCC) 05/07/2018   Cardiomegaly    Encntr for general adult medical exam w/o abnormal findings 04/25/2018   Hearing loss 04/25/2018   LBBB (left bundle branch block) 04/25/2018   Hyperlipidemia 04/25/2018   Other long term (current) drug therapy 04/25/2018   Other specified disorders of pigmentation 04/25/2018   Personal history of noncompliance with medical treatment, presenting hazards to health 04/25/2018   Sensorineural hearing loss (SNHL) of left ear 06/14/2017   Hypertensive heart disease 06/05/2013   Pure hypercholesterolemia 06/05/2013   Vitamin D deficiency 06/05/2013   Corns and callosity 11/03/2010    Current Outpatient Medications:    allopurinol  (ZYLOPRIM ) 100 MG tablet, TAKE 1/2 TALBLET (50 MG) BY MOUTH EVERY OTHER DAY FOR GOUT PREVENTION, Disp: 42 tablet, Rfl: 2   amLODipine  (NORVASC ) 10 MG tablet, Take 1 tablet (10 mg total) by mouth daily., Disp: 30 tablet, Rfl: 2   atorvastatin  (LIPITOR) 40 MG tablet, Take 1 tablet (40 mg total) by mouth daily., Disp: 30 tablet, Rfl: 11   bictegravir-emtricitabine-tenofovir AF (BIKTARVY ) 50-200-25 MG TABS tablet, Take 1 tablet by mouth daily., Disp: 30 tablet, Rfl: 11   carvedilol  (COREG ) 3.125 MG tablet, Take 1 tablet (3.125 mg total) by mouth 2 (two) times daily., Disp: 180 tablet, Rfl: 3  empagliflozin  (JARDIANCE ) 10 MG TABS tablet, TAKE 1 TABLET BY MOUTH EVERY DAY, Disp: 30 tablet, Rfl: 4   Finerenone  10 MG TABS, Take 1 tablet (10 mg total) by mouth daily., Disp: 90 tablet, Rfl: 1   losartan  (COZAAR ) 100 MG tablet, TAKE 1 TABLET BY MOUTH EVERY DAY, Disp: 90 tablet, Rfl: 1   metFORMIN  (GLUCOPHAGE ) 1000 MG tablet, TAKE 1 TABLET (1,000 MG TOTAL) BY MOUTH TWICE A DAY WITH FOOD, Disp: 180 tablet, Rfl: 1   spironolactone  (ALDACTONE ) 25 MG tablet, Take  0.5 tablets (12.5 mg total) by mouth daily., Disp: 45 tablet, Rfl: 3   torsemide  (DEMADEX ) 20 MG tablet, TAKE 2 TABLETS (40 MG TOTAL) BY MOUTH DAILY., Disp: 180 tablet, Rfl: 1 No Known Allergies   Social History   Socioeconomic History   Marital status: Divorced    Spouse name: Not on file   Number of children: 2   Years of education: Not on file   Highest education level: Not on file  Occupational History   Occupation: self-employed    Comment: recieves disability  Tobacco Use   Smoking status: Former    Current packs/day: 0.00    Types: Cigarettes    Start date: 1998    Quit date: 1999    Years since quitting: 26.1   Smokeless tobacco: Never   Tobacco comments:    1 pack would last one month--08/01/18  Vaping Use   Vaping status: Never Used  Substance and Sexual Activity   Alcohol use: No   Drug use: No   Sexual activity: Not Currently    Partners: Female    Comment: declied condom  Other Topics Concern   Not on file  Social History Narrative   Not on file   Social Drivers of Health   Financial Resource Strain: Low Risk  (05/06/2023)   Overall Financial Resource Strain (CARDIA)    Difficulty of Paying Living Expenses: Not hard at all  Food Insecurity: No Food Insecurity (09/29/2023)   Hunger Vital Sign    Worried About Running Out of Food in the Last Year: Never true    Ran Out of Food in the Last Year: Never true  Transportation Needs: No Transportation Needs (09/29/2023)   PRAPARE - Administrator, Civil Service (Medical): No    Lack of Transportation (Non-Medical): No  Physical Activity: Sufficiently Active (05/06/2023)   Exercise Vital Sign    Days of Exercise per Week: 6 days    Minutes of Exercise per Session: 120 min  Stress: No Stress Concern Present (05/06/2023)   [employer] of Occupational Health - Occupational Stress Questionnaire    Feeling of Stress : Not at all  Social Connections: Moderately Isolated (05/06/2023)   Social Connection  and Isolation Panel [NHANES]    Frequency of Communication with Friends and Family: More than three times a week    Frequency of Social Gatherings with Friends and Family: More than three times a week    Attends Religious Services: Never    Database Administrator or Organizations: Yes    Attends Engineer, Structural: More than 4 times per year    Marital Status: Divorced  Intimate Partner Violence: Not At Risk (09/29/2023)   Humiliation, Afraid, Rape, and Kick questionnaire    Fear of Current or Ex-Partner: No    Emotionally Abused: No    Physically Abused: No    Sexually Abused: No    Physical Exam      Future Appointments  Date Time Provider Department Center  11/03/2023 10:45 AM Neda Jennet LABOR, MD [facility] None  11/17/2023 10:20 AM Gardenia Led, DO [facility] None  01/25/2024 10:20 AM Georgina Speaks, FNP [facility] None  01/27/2024 10:30 AM Morgan Clayborne CROME, RN [facility] None  02/08/2024  8:45 AM Dennise Kingsley, MD [facility]  05/10/2024 11:00 AM [facility]-ANNUAL WELLNESS VISIT [facility] None

## 2023-10-15 ENCOUNTER — Other Ambulatory Visit (HOSPITAL_COMMUNITY): Payer: Self-pay | Admitting: Emergency Medicine

## 2023-10-15 NOTE — Progress Notes (Signed)
 Paramedicine Encounter    Patient ID: Mark [name], male    DOB: [birth year], 63 y.o.   MRN: [id number]   Complaints NONE  Assessment A&O  4, skin W&D w/ good color.  Denies chest pain or SOB.  Lung sounds clear and equal bilat.  No peripheral edema noted.    Compliance with meds YES  Pill box filled n/a  Refills needed NONE  Meds changes since last visit NONE    Social changes NONE   BP 108/70 (BP Location: Left Arm, Patient Position: Sitting, Cuff Size: Normal)   Pulse 70   Resp 14   Wt 241 lb (109.3 kg)   SpO2 98%   BMI 33.61 kg/m  Weight yesterday-not taken Last visit weight-242lb  ACTION: Home visit completed  [name] [phone] 10/20/23  Patient Care Team: [name], FNP as PCP - General (General Practice) O'Neal, Ronnald Ramp, MD as PCP - Cardiology (Cardiology) Riley Churches, RN as [facility] Care Management  Patient Active Problem List   Diagnosis Date Noted   HIV disease (HCC) 09/27/2023   Herpes zoster vaccination declined 09/27/2023   COVID-19 vaccine administered 05/25/2023   Class 1 obesity due to excess calories without serious comorbidity with body mass index (BMI) of 33.0 to 33.9 in adult 05/25/2023   Chronic gout without tophus 03/31/2023   Hypertensive heart disease with chronic diastolic congestive heart failure (HCC) 01/12/2023   Pulmonary hypertension (HCC) 01/12/2023   Asymptomatic HIV infection, with no history of HIV-related illness (HCC) 01/12/2023   HFrEF (heart failure with reduced ejection fraction) (HCC) 10/16/2022   Chronic respiratory failure with hypoxia (HCC) 10/16/2022   OSA (obstructive sleep apnea) 08/21/2022   Thyroid nodule 01/15/2020   PAD (peripheral artery disease) (HCC) 05/28/2019   Type 2 diabetes mellitus with chronic kidney disease (HCC) 09/26/2018   Obesity (BMI 30-39.9) 09/26/2018   Chronic diastolic heart failure (HCC) 05/07/2018   Cardiomegaly    Encntr for general adult medical exam  w/o abnormal findings 04/25/2018   Hearing loss 04/25/2018   LBBB (left bundle branch block) 04/25/2018   Hyperlipidemia 04/25/2018   Other long term (current) drug therapy 04/25/2018   Other specified disorders of pigmentation 04/25/2018   Personal history of noncompliance with medical treatment, presenting hazards to health 04/25/2018   Sensorineural hearing loss (SNHL) of left ear 06/14/2017   Hypertensive heart disease 06/05/2013   Pure hypercholesterolemia 06/05/2013   Vitamin D deficiency 06/05/2013   Corns and callosity 11/03/2010    Current Outpatient Medications:    allopurinol (ZYLOPRIM) 100 MG tablet, TAKE 1/2 TALBLET (50 MG) BY MOUTH EVERY OTHER DAY FOR GOUT PREVENTION, Disp: 42 tablet, Rfl: 2   amLODipine (NORVASC) 10 MG tablet, Take 1 tablet (10 mg total) by mouth daily., Disp: 30 tablet, Rfl: 2   atorvastatin (LIPITOR) 40 MG tablet, Take 1 tablet (40 mg total) by mouth daily., Disp: 30 tablet, Rfl: 11   bictegravir-emtricitabine-tenofovir AF (BIKTARVY) 50-200-25 MG TABS tablet, Take 1 tablet by mouth daily., Disp: 30 tablet, Rfl: 11   carvedilol (COREG) 3.125 MG tablet, Take 1 tablet (3.125 mg total) by mouth 2 (two) times daily., Disp: 180 tablet, Rfl: 3   empagliflozin (JARDIANCE) 10 MG TABS tablet, TAKE 1 TABLET BY MOUTH EVERY DAY, Disp: 30 tablet, Rfl: 4   Finerenone 10 MG TABS, Take 1 tablet (10 mg total) by mouth daily., Disp: 90 tablet, Rfl: 1   losartan (COZAAR) 100 MG tablet, TAKE 1 TABLET BY MOUTH EVERY DAY, Disp: 90 tablet,  Rfl: 1   metFORMIN (GLUCOPHAGE) 1000 MG tablet, TAKE 1 TABLET (1,000 MG TOTAL) BY MOUTH TWICE A DAY WITH FOOD, Disp: 180 tablet, Rfl: 1   spironolactone (ALDACTONE) 25 MG tablet, Take 0.5 tablets (12.5 mg total) by mouth daily., Disp: 45 tablet, Rfl: 3   torsemide (DEMADEX) 20 MG tablet, TAKE 2 TABLETS (40 MG TOTAL) BY MOUTH DAILY., Disp: 180 tablet, Rfl: 1 No Known Allergies   Social History   Socioeconomic History   Marital status:  Divorced    Spouse name: Not on file   Number of children: 2   Years of education: Not on file   Highest education level: Not on file  Occupational History   Occupation: self-employed    Comment: recieves disability  Tobacco Use   Smoking status: Former    Current packs/day: 0.00    Types: Cigarettes    Start date: 1998    Quit date: 1999    Years since quitting: 26.1   Smokeless tobacco: Never   Tobacco comments:    1 pack would last one month--08/01/18  Vaping Use   Vaping status: Never Used  Substance and Sexual Activity   Alcohol use: No   Drug use: No   Sexual activity: Not Currently    Partners: Female    Comment: declied condom  Other Topics Concern   Not on file  Social History Narrative   Not on file   Social Drivers of Health   Financial Resource Strain: Low Risk  (05/06/2023)   Overall Financial Resource Strain (CARDIA)    Difficulty of Paying Living Expenses: Not hard at all  Food Insecurity: No Food Insecurity (09/29/2023)   Hunger Vital Sign    Worried About Running Out of Food in the Last Year: Never true    Ran Out of Food in the Last Year: Never true  Transportation Needs: No Transportation Needs (09/29/2023)   PRAPARE - Administrator, Civil Service (Medical): No    Lack of Transportation (Non-Medical): No  Physical Activity: Sufficiently Active (05/06/2023)   Exercise Vital Sign    Days of Exercise per Week: 6 days    Minutes of Exercise per Session: 120 min  Stress: No Stress Concern Present (05/06/2023)   [employer] of Occupational Health - Occupational Stress Questionnaire    Feeling of Stress : Not at all  Social Connections: Moderately Isolated (05/06/2023)   Social Connection and Isolation Panel [NHANES]    Frequency of Communication with Friends and Family: More than three times a week    Frequency of Social Gatherings with Friends and Family: More than three times a week    Attends Religious Services: Never    Doctor, general practice or Organizations: Yes    Attends Engineer, structural: More than 4 times per year    Marital Status: Divorced  Intimate Partner Violence: Not At Risk (09/29/2023)   Humiliation, Afraid, Rape, and Kick questionnaire    Fear of Current or Ex-Partner: No    Emotionally Abused: No    Physically Abused: No    Sexually Abused: No    Physical Exam      Future Appointments  Date Time Provider Department Center  11/03/2023 10:45 AM Tomma Lightning, MD [facility] None  11/17/2023 10:20 AM Dorthula Nettles, DO [facility] None  01/25/2024 10:20 AM [name], FNP [facility] None  01/27/2024 10:30 AM Clarene Duke, Karma Lew, RN [facility] None  02/08/2024  8:45 AM Danelle Earthly, MD [facility]  05/10/2024 11:00 AM [facility]ANNUAL WELLNESS VISIT [facility] None

## 2023-10-22 ENCOUNTER — Other Ambulatory Visit (HOSPITAL_COMMUNITY): Payer: Self-pay | Admitting: Emergency Medicine

## 2023-10-22 NOTE — Progress Notes (Signed)
 Paramedicine Encounter    Patient ID: Mark [name], male    DOB: 1961-03-13, 63 y.o.   MRN: [id number]   Complaints NONE  Assessment A&O x 4, skin W&D w/ good color.  Denies chest pain or SOB.  Lung sounds clear throughout.  No peripheral edema noted.   Compliance with meds  YES  Pill box filled n/a  Refills needed NONE  Meds changes since last visit YES    Social changes NONE  [name] A&O x 4, skin W&D w/ good color.  Pt denies chest pain or SOB.  Lung sounds clear and equal bilat.  No peripheral edema noted.  Upon reviewing meds today discovered he has been taking his carvedilol 3.125mg   2 tablets BID.  Looks like med was in old bottle.  Reviewed w/ him correct dosing and he verbalizes he understands same.  He picks up his refills today and will call me to confirm dosing on label is correct.  Next home visit  BP 108/78 (BP Location: Left Arm, Patient Position: Sitting, Cuff Size: Normal)   Pulse 78   Resp 14   Wt 242 lb (109.8 kg)   SpO2 97%   BMI 33.75 kg/m  Weight yesterday- not taken Last visit weight-241lb   ACTION: Home visit completed  [name] [phone] 10/22/23  Patient Care Team: [name], FNP as PCP - General (General Practice) O'Neal, Ronnald Ramp, MD as PCP - Cardiology (Cardiology) Riley Churches, RN as [facility] Care Management  Patient Active Problem List   Diagnosis Date Noted   HIV disease (HCC) 09/27/2023   Herpes zoster vaccination declined 09/27/2023   COVID-19 vaccine administered 05/25/2023   Class 1 obesity due to excess calories without serious comorbidity with body mass index (BMI) of 33.0 to 33.9 in adult 05/25/2023   Chronic gout without tophus 03/31/2023   Hypertensive heart disease with chronic diastolic congestive heart failure (HCC) 01/12/2023   Pulmonary hypertension (HCC) 01/12/2023   Asymptomatic HIV infection, with no history of HIV-related illness (HCC) 01/12/2023   HFrEF (heart failure  with reduced ejection fraction) (HCC) 10/16/2022   Chronic respiratory failure with hypoxia (HCC) 10/16/2022   OSA (obstructive sleep apnea) 08/21/2022   Thyroid nodule 01/15/2020   PAD (peripheral artery disease) (HCC) 05/28/2019   Type 2 diabetes mellitus with chronic kidney disease (HCC) 09/26/2018   Obesity (BMI 30-39.9) 09/26/2018   Chronic diastolic heart failure (HCC) 05/07/2018   Cardiomegaly    Encntr for general adult medical exam w/o abnormal findings 04/25/2018   Hearing loss 04/25/2018   LBBB (left bundle branch block) 04/25/2018   Hyperlipidemia 04/25/2018   Other long term (current) drug therapy 04/25/2018   Other specified disorders of pigmentation 04/25/2018   Personal history of noncompliance with medical treatment, presenting hazards to health 04/25/2018   Sensorineural hearing loss (SNHL) of left ear 06/14/2017   Hypertensive heart disease 06/05/2013   Pure hypercholesterolemia 06/05/2013   Vitamin D deficiency 06/05/2013   Corns and callosity 11/03/2010    Current Outpatient Medications:    allopurinol (ZYLOPRIM) 100 MG tablet, TAKE 1/2 TALBLET (50 MG) BY MOUTH EVERY OTHER DAY FOR GOUT PREVENTION, Disp: 42 tablet, Rfl: 2   amLODipine (NORVASC) 10 MG tablet, Take 1 tablet (10 mg total) by mouth daily., Disp: 30 tablet, Rfl: 2   atorvastatin (LIPITOR) 40 MG tablet, Take 1 tablet (40 mg total) by mouth daily., Disp: 30 tablet, Rfl: 11   bictegravir-emtricitabine-tenofovir AF (BIKTARVY) 50-200-25 MG TABS tablet, Take 1 tablet  by mouth daily., Disp: 30 tablet, Rfl: 11   carvedilol (COREG) 3.125 MG tablet, Take 1 tablet (3.125 mg total) by mouth 2 (two) times daily., Disp: 180 tablet, Rfl: 3   empagliflozin (JARDIANCE) 10 MG TABS tablet, TAKE 1 TABLET BY MOUTH EVERY DAY, Disp: 30 tablet, Rfl: 4   Finerenone 10 MG TABS, Take 1 tablet (10 mg total) by mouth daily., Disp: 90 tablet, Rfl: 1   losartan (COZAAR) 100 MG tablet, TAKE 1 TABLET BY MOUTH EVERY DAY, Disp: 90 tablet,  Rfl: 1   metFORMIN (GLUCOPHAGE) 1000 MG tablet, TAKE 1 TABLET (1,000 MG TOTAL) BY MOUTH TWICE A DAY WITH FOOD, Disp: 180 tablet, Rfl: 1   spironolactone (ALDACTONE) 25 MG tablet, Take 0.5 tablets (12.5 mg total) by mouth daily., Disp: 45 tablet, Rfl: 3   torsemide (DEMADEX) 20 MG tablet, TAKE 2 TABLETS (40 MG TOTAL) BY MOUTH DAILY., Disp: 180 tablet, Rfl: 1 No Known Allergies   Social History   Socioeconomic History   Marital status: Divorced    Spouse name: Not on file   Number of children: 2   Years of education: Not on file   Highest education level: Not on file  Occupational History   Occupation: self-employed    Comment: recieves disability  Tobacco Use   Smoking status: Former    Current packs/day: 0.00    Types: Cigarettes    Start date: 1998    Quit date: 1999    Years since quitting: 26.1   Smokeless tobacco: Never   Tobacco comments:    1 pack would last one month--08/01/18  Vaping Use   Vaping status: Never Used  Substance and Sexual Activity   Alcohol use: No   Drug use: No   Sexual activity: Not Currently    Partners: Female    Comment: declied condom  Other Topics Concern   Not on file  Social History Narrative   Not on file   Social Drivers of Health   Financial Resource Strain: Low Risk  (05/06/2023)   Overall Financial Resource Strain (CARDIA)    Difficulty of Paying Living Expenses: Not hard at all  Food Insecurity: No Food Insecurity (09/29/2023)   Hunger Vital Sign    Worried About Running Out of Food in the Last Year: Never true    Ran Out of Food in the Last Year: Never true  Transportation Needs: No Transportation Needs (09/29/2023)   PRAPARE - Administrator, Civil Service (Medical): No    Lack of Transportation (Non-Medical): No  Physical Activity: Sufficiently Active (05/06/2023)   Exercise Vital Sign    Days of Exercise per Week: 6 days    Minutes of Exercise per Session: 120 min  Stress: No Stress Concern Present (05/06/2023)    [employer] of Occupational Health - Occupational Stress Questionnaire    Feeling of Stress : Not at all  Social Connections: Moderately Isolated (05/06/2023)   Social Connection and Isolation Panel [NHANES]    Frequency of Communication with Friends and Family: More than three times a week    Frequency of Social Gatherings with Friends and Family: More than three times a week    Attends Religious Services: Never    Database administrator or Organizations: Yes    Attends Engineer, structural: More than 4 times per year    Marital Status: Divorced  Intimate Partner Violence: Not At Risk (09/29/2023)   Humiliation, Afraid, Rape, and Kick questionnaire    Fear of  Current or Ex-Partner: No    Emotionally Abused: No    Physically Abused: No    Sexually Abused: No    Physical Exam      Future Appointments  Date Time Provider Department Center  11/03/2023 10:45 AM Tomma Lightning, MD [facility] None  11/17/2023 10:20 AM Dorthula Nettles, DO [facility] None  01/25/2024 10:20 AM [name], FNP [facility] None  01/27/2024 10:30 AM Riley Churches, RN [facility] None  02/08/2024  8:45 AM Danelle Earthly, MD [facility]  05/10/2024 11:00 AM [facility]-ANNUAL WELLNESS VISIT [facility] None

## 2023-10-29 ENCOUNTER — Other Ambulatory Visit (HOSPITAL_COMMUNITY): Payer: Self-pay | Admitting: Emergency Medicine

## 2023-10-29 NOTE — Progress Notes (Signed)
 Paramedicine Encounter    Patient ID: Mark [name], male    DOB: 09/21/1960, 63 y.o.   MRN: [id number]   Complaints NONE  Assessment A&O x 4, skin W&D w/ good color.  Denies chest pain or SOB.  Lung sounds clear and equal bilat.  No peripheral and edema noted.  Compliance with meds Yes  Pill box filled n/a  Refills needed none  Meds changes since last visit none    Social changes NONE   BP 120/78 (BP Location: Left Arm, Patient Position: Sitting, Cuff Size: Normal)   Pulse 77   Resp 16   Wt 241 lb 6.4 oz (109.5 kg)   SpO2 97%   BMI 33.67 kg/m  Weight yesterday- Not taken  Last visit weight-242lb  ATF Mr. Baze A&O x 4, skin W&D w/ good color.  Pt continues to go to the gym daily and is  always upbeat at each visit.  Pt denies chest pain or SOB.  Lung sounds clear throughout. No peripheral edema. He does well w/ med compliance.   Next home visit 11/05/23  ACTION: Home visit completed  [name] [phone] 10/29/23  Patient Care Team: [name], FNP as PCP - General (General Practice) O'Neal, Ronnald Ramp, MD as PCP - Cardiology (Cardiology) Riley Churches, RN as [facility] Care Management  Patient Active Problem List   Diagnosis Date Noted   HIV disease (HCC) 09/27/2023   Herpes zoster vaccination declined 09/27/2023   COVID-19 vaccine administered 05/25/2023   Class 1 obesity due to excess calories without serious comorbidity with body mass index (BMI) of 33.0 to 33.9 in adult 05/25/2023   Chronic gout without tophus 03/31/2023   Hypertensive heart disease with chronic diastolic congestive heart failure (HCC) 01/12/2023   Pulmonary hypertension (HCC) 01/12/2023   Asymptomatic HIV infection, with no history of HIV-related illness (HCC) 01/12/2023   HFrEF (heart failure with reduced ejection fraction) (HCC) 10/16/2022   Chronic respiratory failure with hypoxia (HCC) 10/16/2022   OSA (obstructive sleep apnea) 08/21/2022   Thyroid nodule  01/15/2020   PAD (peripheral artery disease) (HCC) 05/28/2019   Type 2 diabetes mellitus with chronic kidney disease (HCC) 09/26/2018   Obesity (BMI 30-39.9) 09/26/2018   Chronic diastolic heart failure (HCC) 05/07/2018   Cardiomegaly    Encntr for general adult medical exam w/o abnormal findings 04/25/2018   Hearing loss 04/25/2018   LBBB (left bundle branch block) 04/25/2018   Hyperlipidemia 04/25/2018   Other long term (current) drug therapy 04/25/2018   Other specified disorders of pigmentation 04/25/2018   Personal history of noncompliance with medical treatment, presenting hazards to health 04/25/2018   Sensorineural hearing loss (SNHL) of left ear 06/14/2017   Hypertensive heart disease 06/05/2013   Pure hypercholesterolemia 06/05/2013   Vitamin D deficiency 06/05/2013   Corns and callosity 11/03/2010    Current Outpatient Medications:    allopurinol (ZYLOPRIM) 100 MG tablet, TAKE 1/2 TALBLET (50 MG) BY MOUTH EVERY OTHER DAY FOR GOUT PREVENTION, Disp: 42 tablet, Rfl: 2   amLODipine (NORVASC) 10 MG tablet, Take 1 tablet (10 mg total) by mouth daily., Disp: 30 tablet, Rfl: 2   atorvastatin (LIPITOR) 40 MG tablet, Take 1 tablet (40 mg total) by mouth daily., Disp: 30 tablet, Rfl: 11   bictegravir-emtricitabine-tenofovir AF (BIKTARVY) 50-200-25 MG TABS tablet, Take 1 tablet by mouth daily., Disp: 30 tablet, Rfl: 11   carvedilol (COREG) 3.125 MG tablet, Take 1 tablet (3.125 mg total) by mouth 2 (two) times daily., Disp: 180 tablet,  Rfl: 3   empagliflozin (JARDIANCE) 10 MG TABS tablet, TAKE 1 TABLET BY MOUTH EVERY DAY, Disp: 30 tablet, Rfl: 4   Finerenone 10 MG TABS, Take 1 tablet (10 mg total) by mouth daily., Disp: 90 tablet, Rfl: 1   losartan (COZAAR) 100 MG tablet, TAKE 1 TABLET BY MOUTH EVERY DAY, Disp: 90 tablet, Rfl: 1   metFORMIN (GLUCOPHAGE) 1000 MG tablet, TAKE 1 TABLET (1,000 MG TOTAL) BY MOUTH TWICE A DAY WITH FOOD, Disp: 180 tablet, Rfl: 1   spironolactone (ALDACTONE) 25  MG tablet, Take 0.5 tablets (12.5 mg total) by mouth daily., Disp: 45 tablet, Rfl: 3   torsemide (DEMADEX) 20 MG tablet, TAKE 2 TABLETS (40 MG TOTAL) BY MOUTH DAILY., Disp: 180 tablet, Rfl: 1 No Known Allergies   Social History   Socioeconomic History   Marital status: Divorced    Spouse name: Not on file   Number of children: 2   Years of education: Not on file   Highest education level: Not on file  Occupational History   Occupation: self-employed    Comment: recieves disability  Tobacco Use   Smoking status: Former    Current packs/day: 0.00    Types: Cigarettes    Start date: 1998    Quit date: 1999    Years since quitting: 26.1   Smokeless tobacco: Never   Tobacco comments:    1 pack would last one month--08/01/18  Vaping Use   Vaping status: Never Used  Substance and Sexual Activity   Alcohol use: No   Drug use: No   Sexual activity: Not Currently    Partners: Female    Comment: declied condom  Other Topics Concern   Not on file  Social History Narrative   Not on file   Social Drivers of Health   Financial Resource Strain: Low Risk  (05/06/2023)   Overall Financial Resource Strain (CARDIA)    Difficulty of Paying Living Expenses: Not hard at all  Food Insecurity: No Food Insecurity (09/29/2023)   Hunger Vital Sign    Worried About Running Out of Food in the Last Year: Never true    Ran Out of Food in the Last Year: Never true  Transportation Needs: No Transportation Needs (09/29/2023)   PRAPARE - Administrator, Civil Service (Medical): No    Lack of Transportation (Non-Medical): No  Physical Activity: Sufficiently Active (05/06/2023)   Exercise Vital Sign    Days of Exercise per Week: 6 days    Minutes of Exercise per Session: 120 min  Stress: No Stress Concern Present (05/06/2023)   [employer] of Occupational Health - Occupational Stress Questionnaire    Feeling of Stress : Not at all  Social Connections: Moderately Isolated (05/06/2023)    Social Connection and Isolation Panel [NHANES]    Frequency of Communication with Friends and Family: More than three times a week    Frequency of Social Gatherings with Friends and Family: More than three times a week    Attends Religious Services: Never    Database administrator or Organizations: Yes    Attends Engineer, structural: More than 4 times per year    Marital Status: Divorced  Intimate Partner Violence: Not At Risk (09/29/2023)   Humiliation, Afraid, Rape, and Kick questionnaire    Fear of Current or Ex-Partner: No    Emotionally Abused: No    Physically Abused: No    Sexually Abused: No    Physical Exam  Future Appointments  Date Time Provider Department Center  11/03/2023 10:45 AM Tomma Lightning, MD [facility] None  11/17/2023 10:20 AM Dorthula Nettles, DO [facility] None  01/25/2024 10:20 AM [name], FNP [facility] None  01/27/2024 10:30 AM Clarene Duke, Karma Lew, RN [facility] None  02/08/2024  8:45 AM Danelle Earthly, MD [facility]  05/10/2024 11:00 AM [facility]-ANNUAL WELLNESS VISIT [facility] None

## 2023-11-03 ENCOUNTER — Encounter: Payer: Self-pay | Admitting: Pulmonary Disease

## 2023-11-03 ENCOUNTER — Ambulatory Visit: Payer: Medicare HMO | Admitting: Pulmonary Disease

## 2023-11-03 VITALS — BP 122/80 | HR 80 | Temp 98.2°F | Ht 70.0 in | Wt 241.0 lb

## 2023-11-03 DIAGNOSIS — I5020 Unspecified systolic (congestive) heart failure: Secondary | ICD-10-CM

## 2023-11-03 DIAGNOSIS — R0683 Snoring: Secondary | ICD-10-CM

## 2023-11-03 DIAGNOSIS — J9611 Chronic respiratory failure with hypoxia: Secondary | ICD-10-CM

## 2023-11-03 DIAGNOSIS — I272 Pulmonary hypertension, unspecified: Secondary | ICD-10-CM

## 2023-11-03 DIAGNOSIS — G4733 Obstructive sleep apnea (adult) (pediatric): Secondary | ICD-10-CM

## 2023-11-03 NOTE — Progress Notes (Signed)
 Mark [name]    [id number]    July 02, 1961  Primary Care Physician:Moore, Lolita Cram, FNP  Referring Physician: Dorothyann Peng, MD [address] [place],  [place] [address]  Chief complaint:   Patient with a history of severe obstructive sleep apnea, history of heart failure, chronic hypoxemic respiratory failure in for evaluation  HPI:  Was diagnosed with severe obstructive sleep apnea in 2015  Has been back to using CPAP Uses CPAP nightly Wakes up feeling like he is at a good nights rest Not very tired during the day  Activity level is relatively well at present  History of heart failure, chronic hypoxemic respiratory failure for which he is on oxygen supplementation History of HIV -Compliant with treatment  Uses oxygen around-the-clock  Reformed smoker  Denies any significant dryness of his mouth in the mornings Wakes up feeling like he is at a good nights rest Gets about 7 to 8 hours on his CPAP    Outpatient Encounter Medications as of 11/03/2023  Medication Sig   allopurinol (ZYLOPRIM) 100 MG tablet TAKE 1/2 TALBLET (50 MG) BY MOUTH EVERY OTHER DAY FOR GOUT PREVENTION   amLODipine (NORVASC) 10 MG tablet Take 1 tablet (10 mg total) by mouth daily.   atorvastatin (LIPITOR) 40 MG tablet Take 1 tablet (40 mg total) by mouth daily.   bictegravir-emtricitabine-tenofovir AF (BIKTARVY) 50-200-25 MG TABS tablet Take 1 tablet by mouth daily.   carvedilol (COREG) 3.125 MG tablet Take 1 tablet (3.125 mg total) by mouth 2 (two) times daily.   empagliflozin (JARDIANCE) 10 MG TABS tablet TAKE 1 TABLET BY MOUTH EVERY DAY   Finerenone 10 MG TABS Take 1 tablet (10 mg total) by mouth daily.   losartan (COZAAR) 100 MG tablet TAKE 1 TABLET BY MOUTH EVERY DAY   metFORMIN (GLUCOPHAGE) 1000 MG tablet TAKE 1 TABLET (1,000 MG TOTAL) BY MOUTH TWICE A DAY WITH FOOD   spironolactone (ALDACTONE) 25 MG tablet Take 0.5 tablets (12.5 mg total) by mouth daily.   torsemide  (DEMADEX) 20 MG tablet TAKE 2 TABLETS (40 MG TOTAL) BY MOUTH DAILY.   No facility-administered encounter medications on file as of 11/03/2023.    Allergies as of 11/03/2023   (No Known Allergies)    Past Medical History:  Diagnosis Date   Abnormal lung function test    a. Reported to possibly be COPD, but per pulm note: "Possible COPD - PFT was more suggestive of restrictive defect and diffusion defect likely from obesity and CHF"   CHF (congestive heart failure) (HCC)    Chronic diastolic heart failure (HCC) 05/07/2018   CKD (chronic kidney disease), stage III (HCC)    Diabetes mellitus type 2 in obese    Elevated troponin 05/07/2018   Hearing impaired    Hyperlipidemia    Hypertension    LBBB (left bundle branch block)    LVH (left ventricular hypertrophy)    Mild aortic stenosis    Mild pulmonary hypertension (HCC)    Morbid obesity (HCC)    Pulmonary hypertension (HCC)    Sleep apnea     Past Surgical History:  Procedure Laterality Date   BACK SURGERY     RIGHT HEART CATH N/A 11/06/2022   Procedure: RIGHT HEART CATH;  Surgeon: Dorthula Nettles, DO;  Location: [facility];  Service: Cardiovascular;  Laterality: N/A;   TRACHEOSTOMY      Family History  Problem Relation Age of Onset   Hypertension Mother  Hypertension Father     Social History   Socioeconomic History   Marital status: Divorced    Spouse name: Not on file   Number of children: 2   Years of education: Not on file   Highest education level: Not on file  Occupational History   Occupation: self-employed    Comment: recieves disability  Tobacco Use   Smoking status: Former    Current packs/day: 0.00    Types: Cigarettes    Start date: 1998    Quit date: 1999    Years since quitting: 26.1   Smokeless tobacco: Never   Tobacco comments:    1 pack would last one month--08/01/18  Vaping Use   Vaping status: Never Used  Substance and Sexual Activity   Alcohol use: No   Drug use: No    Sexual activity: Not Currently    Partners: Female    Comment: declied condom  Other Topics Concern   Not on file  Social History Narrative   Not on file   Social Drivers of Health   Financial Resource Strain: Low Risk  (05/06/2023)   Overall Financial Resource Strain (CARDIA)    Difficulty of Paying Living Expenses: Not hard at all  Food Insecurity: No Food Insecurity (09/29/2023)   Hunger Vital Sign    Worried About Running Out of Food in the Last Year: Never true    Ran Out of Food in the Last Year: Never true  Transportation Needs: No Transportation Needs (09/29/2023)   PRAPARE - Administrator, Civil Service (Medical): No    Lack of Transportation (Non-Medical): No  Physical Activity: Sufficiently Active (05/06/2023)   Exercise Vital Sign    Days of Exercise per Week: 6 days    Minutes of Exercise per Session: 120 min  Stress: No Stress Concern Present (05/06/2023)   [employer] of Occupational Health - Occupational Stress Questionnaire    Feeling of Stress : Not at all  Social Connections: Moderately Isolated (05/06/2023)   Social Connection and Isolation Panel [NHANES]    Frequency of Communication with Friends and Family: More than three times a week    Frequency of Social Gatherings with Friends and Family: More than three times a week    Attends Religious Services: Never    Database administrator or Organizations: Yes    Attends Engineer, structural: More than 4 times per year    Marital Status: Divorced  Intimate Partner Violence: Not At Risk (09/29/2023)   Humiliation, Afraid, Rape, and Kick questionnaire    Fear of Current or Ex-Partner: No    Emotionally Abused: No    Physically Abused: No    Sexually Abused: No    Review of Systems  Constitutional:  Negative for fatigue.  Respiratory:  Positive for apnea. Negative for shortness of breath.   Psychiatric/Behavioral:  Positive for sleep disturbance.     Vitals:   11/03/23 1014  BP: 122/80   Pulse: 80  Temp: 98.2 F (36.8 C)  SpO2: 95%     Physical Exam Constitutional:      Appearance: He is obese.  HENT:     Head: Normocephalic.     Mouth/Throat:     Mouth: Mucous membranes are moist.     Comments: Crowded oropharynx, Mallampati 3 Eyes:     Pupils: Pupils are equal, round, and reactive to light.  Cardiovascular:     Rate and Rhythm: Normal rate and regular rhythm.     Heart  sounds: No murmur heard.    No friction rub.  Pulmonary:     Effort: No respiratory distress.     Breath sounds: No stridor. No wheezing or rhonchi.  Musculoskeletal:     Cervical back: Normal range of motion. No rigidity or tenderness.  Neurological:     Mental Status: He is alert.  Psychiatric:        Mood and Affect: Mood normal.       10/15/2022    9:00 AM  Results of the Epworth flowsheet  Sitting and reading 0  Watching TV 1  Sitting, inactive in a public place (e.g. a theatre or a meeting) 0  As a passenger in a car for an hour without a break 1  Lying down to rest in the afternoon when circumstances permit 1  Sitting and talking to someone 0  Sitting quietly after a lunch without alcohol 0  In a car, while stopped for a few minutes in traffic 0  Total score 3    Data Reviewed: Sleep study from 2015 does reveal severe obstructive sleep apnea Recent echocardiogram with borderline low ejection fraction, grade 1 diastolic dysfunction  Compliance data reviewed showing excellent compliance of the 100% Average use of 7 hours 50 minutes AutoSet 10-16 with EPR of 3 95 percentile pressure of 14.9 Residual AHI of 2  Assessment:  History of obstructive sleep apnea -Has been using CPAP nightly -Benefiting from CPAP therapy  History of diastolic heart failure -Euvolemic  Pulmonary hypertension -Last echo did not mention significant pressure increase -Likely multifactorial pulmonary hypertension  Chronic hypoxemic respiratory failure on oxygen supplementation -Compliant  with oxygen use   Plan/Recommendations: Continue oxygen supplementation  Continue CPAP on a nightly basis  Graded activities as tolerated  Follow-up in about 3 months  Encouraged to call with significant concerns     Virl Diamond MD [facility] 11/03/2023, 10:23 AM  CC: Dorothyann Peng, MD

## 2023-11-03 NOTE — Patient Instructions (Signed)
 I will see you in about 3 months  Continue using your CPAP nightly  Continue using your oxygen  Call us with significant concerns  Weight loss efforts encouraged

## 2023-11-05 ENCOUNTER — Other Ambulatory Visit (HOSPITAL_COMMUNITY): Payer: Self-pay | Admitting: Emergency Medicine

## 2023-11-05 NOTE — Progress Notes (Signed)
 Paramedicine Encounter    Patient ID: Mark [name], male    DOB: [birth year], 63 y.o.   MRN: [id number]   Complaints NONE  Assessment A&O x 4, skin W&D w/ good color.  Denies chest pain or SOB.  Lung sounds clear and equal bilat. No peripheral edema noted  Compliance with meds YES  Pill box filled N/A  Refills needed NONE  Meds changes since last visit NONE    Social changes NONE   VISIT SUMMARY**  BP 120/80 (BP Location: Left Arm, Patient Position: Sitting, Cuff Size: Normal)   Pulse 64   Resp 16   Wt 241 lb 9.6 oz (109.6 kg)   SpO2 100% Comment: w/ 2lpm via nasal cannula  BMI 34.67 kg/m  Weight yesterday- not taken Last visit weight-241lb     ACTION: Home visit completed  [name] doing well today.  He denies chest pain or SOB.  Lung sounds clear throughout and no peripheral edema noted.  He has been compliant with all meds and is doing well making good nutritional choices. Reviewed all medications w/o incident. Remided  pt  he has an appointment w/ Dr. Gasper Lloyd 3/19 Next home visit 3/14 @ 9:00   Patient Care Team: [name], FNP as PCP - General (General Practice) O'Neal, Ronnald Ramp, MD as PCP - Cardiology (Cardiology) Clarene Duke Karma Lew, RN as [facility] Care Management  Patient Active Problem List   Diagnosis Date Noted   HIV disease (HCC) 09/27/2023   Herpes zoster vaccination declined 09/27/2023   COVID-19 vaccine administered 05/25/2023   Class 1 obesity due to excess calories without serious comorbidity with body mass index (BMI) of 33.0 to 33.9 in adult 05/25/2023   Chronic gout without tophus 03/31/2023   Hypertensive heart disease with chronic diastolic congestive heart failure (HCC) 01/12/2023   Pulmonary hypertension (HCC) 01/12/2023   Asymptomatic HIV infection, with no history of HIV-related illness (HCC) 01/12/2023   HFrEF (heart failure with reduced ejection fraction) (HCC) 10/16/2022   Chronic respiratory failure with hypoxia  (HCC) 10/16/2022   OSA (obstructive sleep apnea) 08/21/2022   Thyroid nodule 01/15/2020   PAD (peripheral artery disease) (HCC) 05/28/2019   Type 2 diabetes mellitus with chronic kidney disease (HCC) 09/26/2018   Obesity (BMI 30-39.9) 09/26/2018   Chronic diastolic heart failure (HCC) 05/07/2018   Cardiomegaly    Encntr for general adult medical exam w/o abnormal findings 04/25/2018   Hearing loss 04/25/2018   LBBB (left bundle branch block) 04/25/2018   Hyperlipidemia 04/25/2018   Other long term (current) drug therapy 04/25/2018   Other specified disorders of pigmentation 04/25/2018   Personal history of noncompliance with medical treatment, presenting hazards to health 04/25/2018   Sensorineural hearing loss (SNHL) of left ear 06/14/2017   Hypertensive heart disease 06/05/2013   Pure hypercholesterolemia 06/05/2013   Vitamin D deficiency 06/05/2013   Corns and callosity 11/03/2010    Current Outpatient Medications:    allopurinol (ZYLOPRIM) 100 MG tablet, TAKE 1/2 TALBLET (50 MG) BY MOUTH EVERY OTHER DAY FOR GOUT PREVENTION, Disp: 42 tablet, Rfl: 2   amLODipine (NORVASC) 10 MG tablet, Take 1 tablet (10 mg total) by mouth daily., Disp: 30 tablet, Rfl: 2   atorvastatin (LIPITOR) 40 MG tablet, Take 1 tablet (40 mg total) by mouth daily., Disp: 30 tablet, Rfl: 11   bictegravir-emtricitabine-tenofovir AF (BIKTARVY) 50-200-25 MG TABS tablet, Take 1 tablet by mouth daily., Disp: 30 tablet, Rfl: 11   carvedilol (COREG) 3.125 MG tablet, Take 1 tablet (3.125 mg  total) by mouth 2 (two) times daily., Disp: 180 tablet, Rfl: 3   empagliflozin (JARDIANCE) 10 MG TABS tablet, TAKE 1 TABLET BY MOUTH EVERY DAY, Disp: 30 tablet, Rfl: 4   Finerenone 10 MG TABS, Take 1 tablet (10 mg total) by mouth daily., Disp: 90 tablet, Rfl: 1   losartan (COZAAR) 100 MG tablet, TAKE 1 TABLET BY MOUTH EVERY DAY, Disp: 90 tablet, Rfl: 1   metFORMIN (GLUCOPHAGE) 1000 MG tablet, TAKE 1 TABLET (1,000 MG TOTAL) BY MOUTH  TWICE A DAY WITH FOOD, Disp: 180 tablet, Rfl: 1   spironolactone (ALDACTONE) 25 MG tablet, Take 0.5 tablets (12.5 mg total) by mouth daily., Disp: 45 tablet, Rfl: 3   torsemide (DEMADEX) 20 MG tablet, TAKE 2 TABLETS (40 MG TOTAL) BY MOUTH DAILY., Disp: 180 tablet, Rfl: 1 No Known Allergies   Social History   Socioeconomic History   Marital status: Divorced    Spouse name: Not on file   Number of children: 2   Years of education: Not on file   Highest education level: Not on file  Occupational History   Occupation: self-employed    Comment: recieves disability  Tobacco Use   Smoking status: Former    Current packs/day: 0.00    Types: Cigarettes    Start date: 1998    Quit date: 1999    Years since quitting: 26.1   Smokeless tobacco: Never   Tobacco comments:    1 pack would last one month--08/01/18  Vaping Use   Vaping status: Never Used  Substance and Sexual Activity   Alcohol use: No   Drug use: No   Sexual activity: Not Currently    Partners: Female    Comment: declied condom  Other Topics Concern   Not on file  Social History Narrative   Not on file   Social Drivers of Health   Financial Resource Strain: Low Risk  (05/06/2023)   Overall Financial Resource Strain (CARDIA)    Difficulty of Paying Living Expenses: Not hard at all  Food Insecurity: No Food Insecurity (09/29/2023)   Hunger Vital Sign    Worried About Running Out of Food in the Last Year: Never true    Ran Out of Food in the Last Year: Never true  Transportation Needs: No Transportation Needs (09/29/2023)   PRAPARE - Administrator, Civil Service (Medical): No    Lack of Transportation (Non-Medical): No  Physical Activity: Sufficiently Active (05/06/2023)   Exercise Vital Sign    Days of Exercise per Week: 6 days    Minutes of Exercise per Session: 120 min  Stress: No Stress Concern Present (05/06/2023)   [employer] of Occupational Health - Occupational Stress Questionnaire    Feeling  of Stress : Not at all  Social Connections: Moderately Isolated (05/06/2023)   Social Connection and Isolation Panel [NHANES]    Frequency of Communication with Friends and Family: More than three times a week    Frequency of Social Gatherings with Friends and Family: More than three times a week    Attends Religious Services: Never    Database administrator or Organizations: Yes    Attends Engineer, structural: More than 4 times per year    Marital Status: Divorced  Intimate Partner Violence: Not At Risk (09/29/2023)   Humiliation, Afraid, Rape, and Kick questionnaire    Fear of Current or Ex-Partner: No    Emotionally Abused: No    Physically Abused: No    Sexually  Abused: No    Physical Exam      Future Appointments  Date Time Provider Department Center  11/17/2023 10:20 AM Dorthula Nettles, DO [facility] None  01/25/2024 10:20 AM [name], FNP [facility] None  01/27/2024 10:30 AM Clarene Duke, Karma Lew, RN [facility] None  02/08/2024  8:45 AM Danelle Earthly, MD [facility]  05/10/2024 11:00 AM [facility]ANNUAL WELLNESS VISIT [facility] None

## 2023-11-10 ENCOUNTER — Other Ambulatory Visit: Payer: Self-pay | Admitting: Cardiology

## 2023-11-10 DIAGNOSIS — I5032 Chronic diastolic (congestive) heart failure: Secondary | ICD-10-CM

## 2023-11-11 ENCOUNTER — Other Ambulatory Visit (HOSPITAL_COMMUNITY): Payer: Self-pay | Admitting: Emergency Medicine

## 2023-11-11 NOTE — Progress Notes (Signed)
 Paramedicine Encounter    Patient ID: Mark [name], male    DOB: 21-Sep-1960, 63 y.o.   MRN: [id number]   Complaints NONE  Assessment A&O x 4 skin W&D w/ good color.  Pt denies chest pain or SOB.  Lung sounds clear throughout and no peripheral edema noted.  Compliance with meds  YES  Pill box filled n/a  Refills needed NONE  Meds changes since last visit NONE    Social changes NONE   BP 100/70 (BP Location: Left Arm, Patient Position: Sitting, Cuff Size: Normal)   Pulse 70   Resp 16   Wt 240 lb (108.9 kg)   SpO2 100%   BMI 34.44 kg/m  Weight yesterday-not taken Last visit weight-241lb  ATF Mr. Mcconaghy A&O x 4, skin W&D w/ good color.  Pt continues to do well with taking his meds and making good nutritional choices.  He denies chest pains or SOB.  Lung sounds clear throughout and no peripheral edema noted.  Has an appointment with Dr. Gasper Lloyd 3/19 @ 10:20.  ACTION: Home visit completed  [name] [phone] 11/11/23  Patient Care Team: [name], FNP as PCP - General (General Practice) O'Neal, Ronnald Ramp, MD as PCP - Cardiology (Cardiology) Riley Churches, RN as [facility] Care Management  Patient Active Problem List   Diagnosis Date Noted   HIV disease (HCC) 09/27/2023   Herpes zoster vaccination declined 09/27/2023   COVID-19 vaccine administered 05/25/2023   Class 1 obesity due to excess calories without serious comorbidity with body mass index (BMI) of 33.0 to 33.9 in adult 05/25/2023   Chronic gout without tophus 03/31/2023   Hypertensive heart disease with chronic diastolic congestive heart failure (HCC) 01/12/2023   Pulmonary hypertension (HCC) 01/12/2023   Asymptomatic HIV infection, with no history of HIV-related illness (HCC) 01/12/2023   HFrEF (heart failure with reduced ejection fraction) (HCC) 10/16/2022   Chronic respiratory failure with hypoxia (HCC) 10/16/2022   OSA (obstructive sleep apnea) 08/21/2022   Thyroid nodule  01/15/2020   PAD (peripheral artery disease) (HCC) 05/28/2019   Type 2 diabetes mellitus with chronic kidney disease (HCC) 09/26/2018   Obesity (BMI 30-39.9) 09/26/2018   Chronic diastolic heart failure (HCC) 05/07/2018   Cardiomegaly    Encntr for general adult medical exam w/o abnormal findings 04/25/2018   Hearing loss 04/25/2018   LBBB (left bundle branch block) 04/25/2018   Hyperlipidemia 04/25/2018   Other long term (current) drug therapy 04/25/2018   Other specified disorders of pigmentation 04/25/2018   Personal history of noncompliance with medical treatment, presenting hazards to health 04/25/2018   Sensorineural hearing loss (SNHL) of left ear 06/14/2017   Hypertensive heart disease 06/05/2013   Pure hypercholesterolemia 06/05/2013   Vitamin D deficiency 06/05/2013   Corns and callosity 11/03/2010    Current Outpatient Medications:    allopurinol (ZYLOPRIM) 100 MG tablet, TAKE 1/2 TALBLET (50 MG) BY MOUTH EVERY OTHER DAY FOR GOUT PREVENTION, Disp: 42 tablet, Rfl: 2   amLODipine (NORVASC) 10 MG tablet, Take 1 tablet (10 mg total) by mouth daily., Disp: 30 tablet, Rfl: 2   atorvastatin (LIPITOR) 40 MG tablet, Take 1 tablet (40 mg total) by mouth daily., Disp: 30 tablet, Rfl: 11   bictegravir-emtricitabine-tenofovir AF (BIKTARVY) 50-200-25 MG TABS tablet, Take 1 tablet by mouth daily., Disp: 30 tablet, Rfl: 11   carvedilol (COREG) 3.125 MG tablet, Take 1 tablet (3.125 mg total) by mouth 2 (two) times daily., Disp: 180 tablet, Rfl: 3   empagliflozin (JARDIANCE) 10  MG TABS tablet, TAKE 1 TABLET BY MOUTH EVERY DAY, Disp: 30 tablet, Rfl: 4   Finerenone 10 MG TABS, Take 1 tablet (10 mg total) by mouth daily., Disp: 90 tablet, Rfl: 1   losartan (COZAAR) 100 MG tablet, TAKE 1 TABLET BY MOUTH EVERY DAY, Disp: 90 tablet, Rfl: 2   metFORMIN (GLUCOPHAGE) 1000 MG tablet, TAKE 1 TABLET (1,000 MG TOTAL) BY MOUTH TWICE A DAY WITH FOOD, Disp: 180 tablet, Rfl: 1   spironolactone (ALDACTONE) 25  MG tablet, Take 0.5 tablets (12.5 mg total) by mouth daily., Disp: 45 tablet, Rfl: 3   torsemide (DEMADEX) 20 MG tablet, TAKE 2 TABLETS (40 MG TOTAL) BY MOUTH DAILY., Disp: 180 tablet, Rfl: 1 No Known Allergies   Social History   Socioeconomic History   Marital status: Divorced    Spouse name: Not on file   Number of children: 2   Years of education: Not on file   Highest education level: Not on file  Occupational History   Occupation: self-employed    Comment: recieves disability  Tobacco Use   Smoking status: Former    Current packs/day: 0.00    Types: Cigarettes    Start date: 1998    Quit date: 1999    Years since quitting: 26.2   Smokeless tobacco: Never   Tobacco comments:    1 pack would last one month--08/01/18  Vaping Use   Vaping status: Never Used  Substance and Sexual Activity   Alcohol use: No   Drug use: No   Sexual activity: Not Currently    Partners: Female    Comment: declied condom  Other Topics Concern   Not on file  Social History Narrative   Not on file   Social Drivers of Health   Financial Resource Strain: Low Risk  (05/06/2023)   Overall Financial Resource Strain (CARDIA)    Difficulty of Paying Living Expenses: Not hard at all  Food Insecurity: No Food Insecurity (09/29/2023)   Hunger Vital Sign    Worried About Running Out of Food in the Last Year: Never true    Ran Out of Food in the Last Year: Never true  Transportation Needs: No Transportation Needs (09/29/2023)   PRAPARE - Administrator, Civil Service (Medical): No    Lack of Transportation (Non-Medical): No  Physical Activity: Sufficiently Active (05/06/2023)   Exercise Vital Sign    Days of Exercise per Week: 6 days    Minutes of Exercise per Session: 120 min  Stress: No Stress Concern Present (05/06/2023)   [employer] of Occupational Health - Occupational Stress Questionnaire    Feeling of Stress : Not at all  Social Connections: Moderately Isolated (05/06/2023)    Social Connection and Isolation Panel [NHANES]    Frequency of Communication with Friends and Family: More than three times a week    Frequency of Social Gatherings with Friends and Family: More than three times a week    Attends Religious Services: Never    Database administrator or Organizations: Yes    Attends Engineer, structural: More than 4 times per year    Marital Status: Divorced  Intimate Partner Violence: Not At Risk (09/29/2023)   Humiliation, Afraid, Rape, and Kick questionnaire    Fear of Current or Ex-Partner: No    Emotionally Abused: No    Physically Abused: No    Sexually Abused: No    Physical Exam      Future Appointments  Date Time  Provider Department Center  11/17/2023 10:20 AM Dorthula Nettles, DO [facility] None  01/25/2024 10:20 AM [name], FNP [facility] None  01/27/2024 10:30 AM Clarene Duke Karma Lew, RN [facility] None  02/08/2024  8:45 AM Danelle Earthly, MD [facility]  05/10/2024 11:00 AM [facility]-ANNUAL WELLNESS VISIT [facility] None

## 2023-11-16 NOTE — Progress Notes (Incomplete)
 ADVANCED HEART FAILURE CLINIC NOTE  Referring Physician: [name], FNP  Primary Care: [name], FNP Primary Cardiologist: Armanda Magic  CC: chronic systolic heart failure, pulmonary hypertension  HPI: Mark [name] is a 63 y.o. male w/ HFpEF, severe OSA, recently diagnosed HIV on Biktarvy (followed by Dr. Thedore Mins) admitted most recently in December 2023 for acute hypoxic respiratory failure believed to be secondary to acute on chronic HFpEF exacerbation. During that admission he had a TTE w/ LVEF 50-55% and severe asymmetric septal hypertrophy but no dynamic LVOT gradient. He was diuresed with IV lasix, started on GDMT and discharged home. Since that time he has been seen in [facility] clinic at where jardiance/losartan were continued and coreg was increased to 12.5mg  BID. He has also followed with pulmonology in the interim. Since discharge in 12/23 he has been on chronic 2L home O2 with some improvement in exercise capacity. He had PFTs in 2019 w/o any obstructive disease.   Interval hx:  - He goes to the gym daily now; from a functional standpoint he is doing very well. He currently wears 2L O2 throughout the day.  - Compliant with all medications - No LE edema, minimal shortness of breath.   Activity level/exercise tolerance: Improved, NYHA IIb Orthopnea:  Sleeps on 2-3 pillows Paroxysmal noctural dyspnea:  no Chest pain/pressure:  no Orthostatic lightheadedness:  no Palpitations:  no Lower extremity edema: No edema Presyncope/syncope:  no Cough:  no   Current Outpatient Medications  Medication Sig Dispense Refill   allopurinol (ZYLOPRIM) 100 MG tablet TAKE 1/2 TALBLET (50 MG) BY MOUTH EVERY OTHER DAY FOR GOUT PREVENTION 42 tablet 2   amLODipine (NORVASC) 10 MG tablet Take 1 tablet (10 mg total) by mouth daily. 30 tablet 2   atorvastatin (LIPITOR) 40 MG tablet Take 1 tablet (40 mg total) by mouth daily. 30 tablet 11   bictegravir-emtricitabine-tenofovir AF (BIKTARVY)  50-200-25 MG TABS tablet Take 1 tablet by mouth daily. 30 tablet 11   carvedilol (COREG) 3.125 MG tablet Take 1 tablet (3.125 mg total) by mouth 2 (two) times daily. 180 tablet 3   empagliflozin (JARDIANCE) 10 MG TABS tablet TAKE 1 TABLET BY MOUTH EVERY DAY 30 tablet 4   Finerenone 10 MG TABS Take 1 tablet (10 mg total) by mouth daily. 90 tablet 1   losartan (COZAAR) 100 MG tablet TAKE 1 TABLET BY MOUTH EVERY DAY 90 tablet 2   metFORMIN (GLUCOPHAGE) 1000 MG tablet TAKE 1 TABLET (1,000 MG TOTAL) BY MOUTH TWICE A DAY WITH FOOD 180 tablet 1   spironolactone (ALDACTONE) 25 MG tablet Take 0.5 tablets (12.5 mg total) by mouth daily. 45 tablet 3   torsemide (DEMADEX) 20 MG tablet TAKE 2 TABLETS (40 MG TOTAL) BY MOUTH DAILY. 180 tablet 1   No current facility-administered medications for this encounter.      PHYSICAL EXAM: Vitals:   11/17/23 1034  BP: 118/78  Pulse: 64  SpO2: 95%   GENERAL: NAD Lungs- coarse; nowrmal work of breathing CARDIAC:  JVP: 6 cm          Normal rate with regular rhythm. No murmur.  Pulses 2+. No edema.  ABDOMEN: Soft, non-tender, non-distended.  EXTREMITIES: Warm and well perfused.  NEUROLOGIC: No obvious FND    DATA REVIEW  ECG: 08/25/22: NSR with wide LBBB, QRSd of  ECHO: LVEF 55%, severe asymmetric LVH.   PYP:  12/28/22: Grade II uptake  CMR: 03/30/23:  Study not suggestive of cardiac amyloidosis.  In chart  review, study is more consistent with hypertensive heart disease; this is favored over low risk variant hypertrophic non obstructive cardiomyopathy.  Evidence of LBBB.  LVEF 47%.  Evidence of RV dilation and pulmonary hypertension.     Latest Ref Rng & Units 05/11/2018   10:13 AM  PFT Results  FVC-Pre L 2.46   FVC-Predicted Pre % 58   FVC-Post L 2.25   FVC-Predicted Post % 53   Pre FEV1/FVC % % 79   Post FEV1/FCV % % 81   FEV1-Pre L 1.94   FEV1-Predicted Pre % 59   FEV1-Post L 1.84   DLCO uncorrected ml/min/mmHg 19.38   DLCO  UNC% % 57   DLCO corrected ml/min/mmHg 19.67   DLCO COR %Predicted % 58   DLVA Predicted % 105   TLC L 5.03   TLC % Predicted % 69   RV % Predicted % 99     HEMODYNAMICS: RA:                  8 mmHg (mean) RV:                  34/8 mmHg PA:                  62/25 mmHg (37 mean) PCWP:            18 mmHg (mean)                                      Estimated Fick CO/CI   4.5 L/min, 2 L/min/m2                                                 TPG                 19  mmHg                                            PVR                 4.2 Wood Units  PAPi                4.6       IMPRESSION: 1.Mildly elevated pre and post capillary filling pressures 2.Moderately elevated PVR and PA mean consistent with 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  3.Moderately reduced cardiac index.   ASSESSMENT & PLAN:  Heart failure with preserved EF Etiology of LK:GMWNUU LVH on TTE; although, he has significant HTN, degree of LVH is concerning for infiltrative cardiomyopathy. CMR consistent with hypertensive heart disease. Myeloma panel previously negative; however, no FLC assay.  NYHA class / AHA Stage:IIB Volume status & Diuretics: Euvolemic on exam today Vasodilators:Losartan 100mg  daily; continue amlodipine 10mg ; BP at goal. Can start chlorthalidone or switch to combo pill in the future if requiried.  Beta-Blocker: currently on coreg 3.125mg  BID; will discontinue.  MRA: continuefineronone 10mg  daily Cardiometabolic:Jardiance 10mg  Devices therapies & Valvulopathies:CMR consistent with hypertensive heart disease Advanced therapies:Not indicated.   2. Pulmonary HTN - Likely group 3; on chronic O2; severe OHS/OSA. He will likely benefit mostly from O2 therapy. Will refer to  pulmonology  - V/Q scan pending - Serologic work up is negative: negative ANA - Does not have a heavy smoking history.   - CTA PE from 08/21/22 without interstitial lung disease.  - He will see pulmonology on  08/03/23; he wishes to wean off O2, however, his O2 sats drop to the 80s when he is off oxygen.  -HRCT from 11/24 with no evidence of fibrotic lung disease.   3. Evaluation for Cardiac amyloid - PYP scan is grade 1 - Negative myeloma panel (no M spike etc) -At this time I do not think his PYP scan is positive.  LVH is likely secondary to uncontrolled hypertension.  CMR not consistent with amyloid.   4. HTN - continue amlodipine 10mg  daily.  - losartan 100mg  daily - BMP/BNP today  5. OSA - untreated, sleep study in 2015 showed severe OSA w/ AHI 41.5  - discussed association between untreated OSA and CV disease/ HTN - followed by Dr. Mayford Knife.  - He has now received his CPAP; reports compliance with it.    6. Obesity  - Body mass index is 35.93 kg/m. - wt loss advised    7. CKD IIIb  - b/l Scr previously  ~1.4 - continue fineronone 10mg  daily - repeat BMP/BNP today.    8. HLD - on atorvastatin 20  - followed by cardiology    9. HIV - now followed by ID, Dr. Thedore Mins  - continue [place]  - reports that he is compliant with therapy.   10. Chronic hypoxemic respiratory failure  - followed by pulmonology; last seen on 11/03/23, reviewed notes.  - currently on 2LPM; wishes to come off. Will plan on at follow up.   I spent 40 minutes caring for this patient today including face to face time, ordering and reviewing labs, reviewing records from pulmonology, discussing O2 therapy, seeing the patient, documenting in the record, and arranging follow ups.    Zacchaeus Halm Advanced Heart Failure Mechanical Circulatory Support

## 2023-11-17 ENCOUNTER — Other Ambulatory Visit (HOSPITAL_COMMUNITY): Payer: Self-pay | Admitting: Emergency Medicine

## 2023-11-17 ENCOUNTER — Other Ambulatory Visit (HOSPITAL_COMMUNITY): Payer: Self-pay | Admitting: Cardiology

## 2023-11-17 ENCOUNTER — Ambulatory Visit (HOSPITAL_COMMUNITY)
Admission: RE | Admit: 2023-11-17 | Discharge: 2023-11-17 | Disposition: A | Discharge status: Home / Self Care | Payer: Medicare HMO | Source: Ambulatory Visit | Attending: Cardiology | Admitting: Cardiology

## 2023-11-17 ENCOUNTER — Encounter (HOSPITAL_COMMUNITY): Payer: Self-pay | Admitting: Cardiology

## 2023-11-17 VITALS — BP 118/78 | HR 64 | Ht 70.0 in | Wt 240.0 lb

## 2023-11-17 DIAGNOSIS — G4733 Obstructive sleep apnea (adult) (pediatric): Secondary | ICD-10-CM | POA: Diagnosis not present

## 2023-11-17 DIAGNOSIS — Z21 Asymptomatic human immunodeficiency virus [HIV] infection status: Secondary | ICD-10-CM

## 2023-11-17 DIAGNOSIS — E785 Hyperlipidemia, unspecified: Secondary | ICD-10-CM | POA: Diagnosis not present

## 2023-11-17 DIAGNOSIS — I130 Hypertensive heart and chronic kidney disease with heart failure and stage 1 through stage 4 chronic kidney disease, or unspecified chronic kidney disease: Secondary | ICD-10-CM | POA: Insufficient documentation

## 2023-11-17 DIAGNOSIS — I10 Essential (primary) hypertension: Secondary | ICD-10-CM

## 2023-11-17 DIAGNOSIS — I5032 Chronic diastolic (congestive) heart failure: Secondary | ICD-10-CM | POA: Diagnosis not present

## 2023-11-17 DIAGNOSIS — E669 Obesity, unspecified: Secondary | ICD-10-CM | POA: Insufficient documentation

## 2023-11-17 DIAGNOSIS — N1832 Chronic kidney disease, stage 3b: Secondary | ICD-10-CM | POA: Insufficient documentation

## 2023-11-17 DIAGNOSIS — Z6835 Body mass index (BMI) 35.0-35.9, adult: Secondary | ICD-10-CM | POA: Diagnosis not present

## 2023-11-17 DIAGNOSIS — I272 Pulmonary hypertension, unspecified: Secondary | ICD-10-CM

## 2023-11-17 DIAGNOSIS — Z9981 Dependence on supplemental oxygen: Secondary | ICD-10-CM | POA: Diagnosis not present

## 2023-11-17 DIAGNOSIS — Z79899 Other long term (current) drug therapy: Secondary | ICD-10-CM | POA: Diagnosis not present

## 2023-11-17 DIAGNOSIS — J9611 Chronic respiratory failure with hypoxia: Secondary | ICD-10-CM

## 2023-11-17 LAB — BASIC METABOLIC PANEL
Anion gap: 8 (ref 5–15)
BUN: 44 mg/dL — ABNORMAL HIGH (ref 8–23)
CO2: 26 mmol/L (ref 22–32)
Calcium: 9.2 mg/dL (ref 8.9–10.3)
Chloride: 108 mmol/L (ref 98–111)
Creatinine, Ser: 2.96 mg/dL — ABNORMAL HIGH (ref 0.61–1.24)
GFR, Estimated: 23 mL/min — ABNORMAL LOW (ref >60)
Glucose, Bld: 83 mg/dL (ref 70–99)
Potassium: 4.2 mmol/L (ref 3.5–5.1)
Sodium: 142 mmol/L (ref 135–145)

## 2023-11-17 LAB — BRAIN NATRIURETIC PEPTIDE: B Natriuretic Peptide: 34.6 pg/mL (ref 0.0–100.0)

## 2023-11-17 NOTE — Progress Notes (Signed)
 Paramedicine Encounter   Patient ID: Mark [name] , male,   DOB: [birth year],63 y.o.,  MRN: [id number]   Met patient in clinic today with provider.  Time spent with patient 1.5 hr    Weight @ clinic 240lb B/P-118/78 P-64 SP02-95% w/ 2lpm O2 via cannula  Med changes- NONE  Pt received good report from Dr. Gasper Lloyd.  No med changes at this time.  Will have Echocardiogram 02/17/24 @ 9:00 and f/u visit @ 10:00 w/ Dr. Vanita Panda, EMT-Paramedic [phone] 11/17/2023

## 2023-11-17 NOTE — Patient Instructions (Addendum)
 Medication Changes:  No Changes In Medications at this time.   Lab Work:  Labs done today, your results will be available in MyChart, we will contact you for abnormal readings.  Follow-Up in: 3 MONTHS WITH AN ECHOCARDIOGRAM AS SCHEDULED WITH DR. Gasper Lloyd   At the Advanced [facility] Failure Clinic, you and your health needs are our priority. We have a designated team specialized in the treatment of Heart Failure. This Care Team includes your primary Heart Failure Specialized Cardiologist (physician), Advanced Practice Providers (APPs- Physician Assistants and Nurse Practitioners), and Pharmacist who all work together to provide you with the care you need, when you need it.   You may see any of the following providers on your designated Care Team at your next follow up:  Dr. Arvilla Meres Dr. Marca Ancona Dr. Dorthula Nettles Dr. Theresia Bough Tonye Becket, NP Robbie Lis, [facility] [place], [place] Brynda Peon, NP [place] Lee, NP Karle Plumber, PharmD   Please be sure to bring in all your medications bottles to every appointment.   Need to Contact us:  If you have any questions or concerns before your next appointment please send [place] a message through [place] or call our office at [phone].    TO LEAVE A MESSAGE FOR THE NURSE SELECT OPTION 2, PLEASE LEAVE A MESSAGE INCLUDING: YOUR NAME DATE OF BIRTH CALL BACK NUMBER REASON FOR CALL**this is important as we prioritize the call backs  YOU WILL RECEIVE A CALL BACK THE SAME DAY AS LONG AS YOU CALL BEFORE 4:00 PM

## 2023-11-22 ENCOUNTER — Telehealth (HOSPITAL_COMMUNITY): Payer: Self-pay

## 2023-11-22 DIAGNOSIS — I5032 Chronic diastolic (congestive) heart failure: Secondary | ICD-10-CM

## 2023-11-22 MED ORDER — TORSEMIDE 20 MG PO TABS: 20.0000 mg | ORAL_TABLET | Freq: Every day | ORAL | 1 refills | Status: AC

## 2023-11-22 NOTE — Telephone Encounter (Addendum)
 Pt aware, agreeable, and verbalized understanding  Labs scheduled and ordered med list updated  ----- Message from Dorthula Nettles sent at 11/19/2023  4:49 PM EDT ----- Can we ask him to hold torsemide the next day and reduce dose to 20mg  daily. Repeat BMP 1 week after changes.   Adi

## 2023-11-26 ENCOUNTER — Other Ambulatory Visit (HOSPITAL_COMMUNITY): Payer: Self-pay | Admitting: Emergency Medicine

## 2023-11-26 NOTE — Progress Notes (Signed)
 Paramedicine Encounter    Patient ID: Mark [name], male    DOB: Jul 08, 1961, 63 y.o.   MRN: [id number]   Complaints none  Assessment A&O x 4, skin W&D w/ good color.  Denies chest pain or SOB.  Lung sounds clear throughout and no peripheral edema noted.  Compliance with meds YES  Pill box filled n/a  Refills needed NONE  Meds changes since last visit Torsemide 20mg .  1 tab. daily   Social changes NONE   BP 118/70 (BP Location: Left Arm, Patient Position: Sitting, Cuff Size: Normal)   Pulse 60   Resp 16   Wt 238 lb (108 kg)   SpO2 98%   BMI 34.15 kg/m  Weight yesterday- not taken Last visit weight-240lb  [name] A&O x 4, skin W&D w/ good color.  He states he feels good and has been doing well with medication compliance.  He makes good food choices and does a lot of cooking at home instead of eating out. He has a lab appointment 11/30/23 @ 9:30.  ACTION: Home visit completed  Mark [name] [phone] 11/26/23  Patient Care Team: [name], FNP as PCP - General (General Practice) O'Neal, Ronnald Ramp, MD as PCP - Cardiology (Cardiology) Riley Churches, RN as [facility] Care Management  Patient Active Problem List   Diagnosis Date Noted   HIV disease (HCC) 09/27/2023   Herpes zoster vaccination declined 09/27/2023   COVID-19 vaccine administered 05/25/2023   Class 1 obesity due to excess calories without serious comorbidity with body mass index (BMI) of 33.0 to 33.9 in adult 05/25/2023   Chronic gout without tophus 03/31/2023   Hypertensive heart disease with chronic diastolic congestive heart failure (HCC) 01/12/2023   Pulmonary hypertension (HCC) 01/12/2023   Asymptomatic HIV infection, with no history of HIV-related illness (HCC) 01/12/2023   HFrEF (heart failure with reduced ejection fraction) (HCC) 10/16/2022   Chronic respiratory failure with hypoxia (HCC) 10/16/2022   OSA (obstructive sleep apnea) 08/21/2022   Thyroid nodule  01/15/2020   PAD (peripheral artery disease) (HCC) 05/28/2019   Type 2 diabetes mellitus with chronic kidney disease (HCC) 09/26/2018   Obesity (BMI 30-39.9) 09/26/2018   Chronic diastolic heart failure (HCC) 05/07/2018   Cardiomegaly    Encntr for general adult medical exam w/o abnormal findings 04/25/2018   Hearing loss 04/25/2018   LBBB (left bundle branch block) 04/25/2018   Hyperlipidemia 04/25/2018   Other long term (current) drug therapy 04/25/2018   Other specified disorders of pigmentation 04/25/2018   Personal history of noncompliance with medical treatment, presenting hazards to health 04/25/2018   Sensorineural hearing loss (SNHL) of left ear 06/14/2017   Hypertensive heart disease 06/05/2013   Pure hypercholesterolemia 06/05/2013   Vitamin D deficiency 06/05/2013   Corns and callosity 11/03/2010    Current Outpatient Medications:    allopurinol (ZYLOPRIM) 100 MG tablet, TAKE 1/2 TALBLET (50 MG) BY MOUTH EVERY OTHER DAY FOR GOUT PREVENTION, Disp: 42 tablet, Rfl: 2   amLODipine (NORVASC) 10 MG tablet, TAKE 1 TABLET BY MOUTH EVERY DAY, Disp: 90 tablet, Rfl: 2   atorvastatin (LIPITOR) 40 MG tablet, Take 1 tablet (40 mg total) by mouth daily., Disp: 30 tablet, Rfl: 11   bictegravir-emtricitabine-tenofovir AF (BIKTARVY) 50-200-25 MG TABS tablet, Take 1 tablet by mouth daily., Disp: 30 tablet, Rfl: 11   carvedilol (COREG) 3.125 MG tablet, Take 1 tablet (3.125 mg total) by mouth 2 (two) times daily., Disp: 180 tablet, Rfl: 3   empagliflozin (JARDIANCE) 10 MG  TABS tablet, TAKE 1 TABLET BY MOUTH EVERY DAY, Disp: 30 tablet, Rfl: 4   Finerenone 10 MG TABS, Take 1 tablet (10 mg total) by mouth daily., Disp: 90 tablet, Rfl: 1   losartan (COZAAR) 100 MG tablet, TAKE 1 TABLET BY MOUTH EVERY DAY, Disp: 90 tablet, Rfl: 2   metFORMIN (GLUCOPHAGE) 1000 MG tablet, TAKE 1 TABLET (1,000 MG TOTAL) BY MOUTH TWICE A DAY WITH FOOD, Disp: 180 tablet, Rfl: 1   spironolactone (ALDACTONE) 25 MG tablet,  Take 0.5 tablets (12.5 mg total) by mouth daily., Disp: 45 tablet, Rfl: 3   torsemide (DEMADEX) 20 MG tablet, Take 1 tablet (20 mg total) by mouth daily., Disp: 180 tablet, Rfl: 1 No Known Allergies   Social History   Socioeconomic History   Marital status: Divorced    Spouse name: Not on file   Number of children: 2   Years of education: Not on file   Highest education level: Not on file  Occupational History   Occupation: self-employed    Comment: recieves disability  Tobacco Use   Smoking status: Former    Current packs/day: 0.00    Types: Cigarettes    Start date: 1998    Quit date: 1999    Years since quitting: 26.2   Smokeless tobacco: Never   Tobacco comments:    1 pack would last one month--08/01/18  Vaping Use   Vaping status: Never Used  Substance and Sexual Activity   Alcohol use: No   Drug use: No   Sexual activity: Not Currently    Partners: Female    Comment: declied condom  Other Topics Concern   Not on file  Social History Narrative   Not on file   Social Drivers of Health   Financial Resource Strain: Low Risk  (05/06/2023)   Overall Financial Resource Strain (CARDIA)    Difficulty of Paying Living Expenses: Not hard at all  Food Insecurity: No Food Insecurity (09/29/2023)   Hunger Vital Sign    Worried About Running Out of Food in the Last Year: Never true    Ran Out of Food in the Last Year: Never true  Transportation Needs: No Transportation Needs (09/29/2023)   PRAPARE - Administrator, Civil Service (Medical): No    Lack of Transportation (Non-Medical): No  Physical Activity: Sufficiently Active (05/06/2023)   Exercise Vital Sign    Days of Exercise per Week: 6 days    Minutes of Exercise per Session: 120 min  Stress: No Stress Concern Present (05/06/2023)   [employer] of Occupational Health - Occupational Stress Questionnaire    Feeling of Stress : Not at all  Social Connections: Moderately Isolated (05/06/2023)   Social  Connection and Isolation Panel [NHANES]    Frequency of Communication with Friends and Family: More than three times a week    Frequency of Social Gatherings with Friends and Family: More than three times a week    Attends Religious Services: Never    Database administrator or Organizations: Yes    Attends Engineer, structural: More than 4 times per year    Marital Status: Divorced  Intimate Partner Violence: Not At Risk (09/29/2023)   Humiliation, Afraid, Rape, and Kick questionnaire    Fear of Current or Ex-Partner: No    Emotionally Abused: No    Physically Abused: No    Sexually Abused: No    Physical Exam      Future Appointments  Date Time Provider  Department Center  11/30/2023  9:30 AM [facility] LAB [facility] None  01/25/2024 10:20 AM [name], FNP [facility] None  01/27/2024 10:30 AM Little, Karma Lew, RN [facility] None  02/08/2024  8:45 AM Danelle Earthly, MD [facility]  02/17/2024  9:00 AM MC ECHO OP 1 [facility]-ECHOLAB [facility]  02/17/2024 10:00 AM Sabharwal, Aditya, DO [facility] None  05/10/2024 11:00 AM TIMA-ANNUAL WELLNESS VISIT [facility] None

## 2023-11-30 ENCOUNTER — Ambulatory Visit (HOSPITAL_COMMUNITY)
Admission: RE | Admit: 2023-11-30 | Discharge: 2023-11-30 | Disposition: A | Discharge status: Home / Self Care | Source: Ambulatory Visit | Attending: Internal Medicine | Admitting: Internal Medicine

## 2023-11-30 DIAGNOSIS — I5032 Chronic diastolic (congestive) heart failure: Secondary | ICD-10-CM | POA: Insufficient documentation

## 2023-11-30 LAB — BASIC METABOLIC PANEL WITH GFR
Anion gap: 10 (ref 5–15)
BUN: 31 mg/dL — ABNORMAL HIGH (ref 8–23)
CO2: 24 mmol/L (ref 22–32)
Calcium: 10 mg/dL (ref 8.9–10.3)
Chloride: 110 mmol/L (ref 98–111)
Creatinine, Ser: 2.4 mg/dL — ABNORMAL HIGH (ref 0.61–1.24)
GFR, Estimated: 29 mL/min — ABNORMAL LOW (ref >60)
Glucose, Bld: 92 mg/dL (ref 70–99)
Potassium: 4.6 mmol/L (ref 3.5–5.1)
Sodium: 144 mmol/L (ref 135–145)

## 2023-12-02 ENCOUNTER — Other Ambulatory Visit (HOSPITAL_COMMUNITY): Payer: Self-pay | Admitting: Emergency Medicine

## 2023-12-02 NOTE — Progress Notes (Signed)
 Paramedicine Encounter    Patient ID: Mark [name], male    DOB: Jul 10, 1961, 63 y.o.   MRN: [id number]   Complaints NONE  Assessment A&O x 4, skin W&D w/ good color.  Denies chest pain or SOB.  Lung sounds clear and equal bilat. No peripheral edema noted  Compliance with meds YES  Pill box filled n/a  Refills needed NONE  Meds changes since last visit NONE    Social changes NONE   BP 110/70 (BP Location: Left Arm, Patient Position: Sitting, Cuff Size: Normal)   Pulse 62   Resp 16   Wt 240 lb (108.9 kg)   SpO2 95%   BMI 34.44 kg/m  Weight yesterday- not taken Last visit weight-238lb  [name] A&O x 4, skin W&D w/ good color.  Denies chest pain or SOB.  Lung sounds clear and equal bilat. No peripheral edema noted.  He continues to go to the gym at least 6 days a week and remains conscious about his food choices.  ACTION: Home visit completed  Mark [name] [phone] 12/02/23  Patient Care Team: Mark Felts, FNP as PCP - General (General Practice) O'Neal, Ronnald Ramp, MD as PCP - Cardiology (Cardiology) Mark Churches, RN as [facility] Care Management  Patient Active Problem List   Diagnosis Date Noted   HIV disease (HCC) 09/27/2023   Herpes zoster vaccination declined 09/27/2023   COVID-19 vaccine administered 05/25/2023   Class 1 obesity due to excess calories without serious comorbidity with body mass index (BMI) of 33.0 to 33.9 in adult 05/25/2023   Chronic gout without tophus 03/31/2023   Hypertensive heart disease with chronic diastolic congestive heart failure (HCC) 01/12/2023   Pulmonary hypertension (HCC) 01/12/2023   Asymptomatic HIV infection, with no history of HIV-related illness (HCC) 01/12/2023   HFrEF (heart failure with reduced ejection fraction) (HCC) 10/16/2022   Chronic respiratory failure with hypoxia (HCC) 10/16/2022   OSA (obstructive sleep apnea) 08/21/2022   Thyroid nodule 01/15/2020   PAD (peripheral artery  disease) (HCC) 05/28/2019   Type 2 diabetes mellitus with chronic kidney disease (HCC) 09/26/2018   Obesity (BMI 30-39.9) 09/26/2018   Chronic diastolic heart failure (HCC) 05/07/2018   Cardiomegaly    Encntr for general adult medical exam w/o abnormal findings 04/25/2018   Hearing loss 04/25/2018   LBBB (left bundle branch block) 04/25/2018   Hyperlipidemia 04/25/2018   Other long term (current) drug therapy 04/25/2018   Other specified disorders of pigmentation 04/25/2018   Personal history of noncompliance with medical treatment, presenting hazards to health 04/25/2018   Sensorineural hearing loss (SNHL) of left ear 06/14/2017   Hypertensive heart disease 06/05/2013   Pure hypercholesterolemia 06/05/2013   Vitamin D deficiency 06/05/2013   Corns and callosity 11/03/2010    Current Outpatient Medications:    allopurinol (ZYLOPRIM) 100 MG tablet, TAKE 1/2 TALBLET (50 MG) BY MOUTH EVERY OTHER DAY FOR GOUT PREVENTION, Disp: 42 tablet, Rfl: 2   amLODipine (NORVASC) 10 MG tablet, TAKE 1 TABLET BY MOUTH EVERY DAY, Disp: 90 tablet, Rfl: 2   atorvastatin (LIPITOR) 40 MG tablet, Take 1 tablet (40 mg total) by mouth daily., Disp: 30 tablet, Rfl: 11   bictegravir-emtricitabine-tenofovir AF (BIKTARVY) 50-200-25 MG TABS tablet, Take 1 tablet by mouth daily., Disp: 30 tablet, Rfl: 11   carvedilol (COREG) 3.125 MG tablet, Take 1 tablet (3.125 mg total) by mouth 2 (two) times daily., Disp: 180 tablet, Rfl: 3   empagliflozin (JARDIANCE) 10 MG TABS tablet, TAKE 1 TABLET  BY MOUTH EVERY DAY, Disp: 30 tablet, Rfl: 4   Finerenone 10 MG TABS, Take 1 tablet (10 mg total) by mouth daily., Disp: 90 tablet, Rfl: 1   losartan (COZAAR) 100 MG tablet, TAKE 1 TABLET BY MOUTH EVERY DAY, Disp: 90 tablet, Rfl: 2   metFORMIN (GLUCOPHAGE) 1000 MG tablet, TAKE 1 TABLET (1,000 MG TOTAL) BY MOUTH TWICE A DAY WITH FOOD, Disp: 180 tablet, Rfl: 1   spironolactone (ALDACTONE) 25 MG tablet, Take 0.5 tablets (12.5 mg total) by  mouth daily., Disp: 45 tablet, Rfl: 3   torsemide (DEMADEX) 20 MG tablet, Take 1 tablet (20 mg total) by mouth daily., Disp: 180 tablet, Rfl: 1 No Known Allergies   Social History   Socioeconomic History   Marital status: Divorced    Spouse name: Not on file   Number of children: 2   Years of education: Not on file   Highest education level: Not on file  Occupational History   Occupation: self-employed    Comment: recieves disability  Tobacco Use   Smoking status: Former    Current packs/day: 0.00    Types: Cigarettes    Start date: 1998    Quit date: 1999    Years since quitting: 26.2   Smokeless tobacco: Never   Tobacco comments:    1 pack would last one month--08/01/18  Vaping Use   Vaping status: Never Used  Substance and Sexual Activity   Alcohol use: No   Drug use: No   Sexual activity: Not Currently    Partners: Female    Comment: declied condom  Other Topics Concern   Not on file  Social History Narrative   Not on file   Social Drivers of Health   Financial Resource Strain: Low Risk  (05/06/2023)   Overall Financial Resource Strain (CARDIA)    Difficulty of Paying Living Expenses: Not hard at all  Food Insecurity: No Food Insecurity (09/29/2023)   Hunger Vital Sign    Worried About Running Out of Food in the Last Year: Never true    Ran Out of Food in the Last Year: Never true  Transportation Needs: No Transportation Needs (09/29/2023)   PRAPARE - Administrator, Civil Service (Medical): No    Lack of Transportation (Non-Medical): No  Physical Activity: Sufficiently Active (05/06/2023)   Exercise Vital Sign    Days of Exercise per Week: 6 days    Minutes of Exercise per Session: 120 min  Stress: No Stress Concern Present (05/06/2023)   [employer] of Occupational Health - Occupational Stress Questionnaire    Feeling of Stress : Not at all  Social Connections: Moderately Isolated (05/06/2023)   Social Connection and Isolation Panel [NHANES]     Frequency of Communication with Friends and Family: More than three times a week    Frequency of Social Gatherings with Friends and Family: More than three times a week    Attends Religious Services: Never    Database administrator or Organizations: Yes    Attends Engineer, structural: More than 4 times per year    Marital Status: Divorced  Intimate Partner Violence: Not At Risk (09/29/2023)   Humiliation, Afraid, Rape, and Kick questionnaire    Fear of Current or Ex-Partner: No    Emotionally Abused: No    Physically Abused: No    Sexually Abused: No    Physical Exam      Future Appointments  Date Time Provider Department Center  01/25/2024 10:20  AM Mark Felts, FNP [facility] None  01/27/2024 10:30 AM Clarene Duke, Karma Lew, RN [facility] None  02/08/2024  8:45 AM Danelle Earthly, MD [facility]  02/17/2024  9:00 AM MC ECHO OP 1 [facility]  02/17/2024 10:00 AM Sabharwal, Aditya, DO [facility] None  05/10/2024 11:00 AM [facility]-ANNUAL WELLNESS VISIT [facility] None

## 2023-12-09 ENCOUNTER — Other Ambulatory Visit (HOSPITAL_COMMUNITY): Payer: Self-pay | Admitting: Emergency Medicine

## 2023-12-09 NOTE — Progress Notes (Signed)
 Paramedicine Encounter    Patient ID: Mark [name], male    DOB: Jan 15, 1961, 63 y.o.   MRN: [id number]   Complaints NONE  Assessment A&O x 4, skin W&D w/ good color.  Pt denies chest pain or SOB.  Lung sounds clear and equal bilat.  No peripheral edema  Compliance with meds YES  Pill box filled n/a  Refills needed NONE  Meds changes since last visit NONE    Social changes NONE   BP 120/80 (BP Location: Left Arm, Patient Position: Sitting, Cuff Size: Normal)   Pulse 74   Resp 16   Wt 240 lb (108.9 kg)   SpO2 94%   BMI 34.44 kg/m  Weight yesterday-not taken Last visit weight-240lb  ATF Pt A&O x 4, skin W&D w/ good color.  Pt continues to do well w med compliance and good nutritional choices.  He goes to the gym 6/7 days a week. No needs at this time.  Next home visit 4/17 @ 9:00  ACTION: Home visit completed  [name] [phone] 12/09/23  Patient Care Team: [name], FNP as PCP - General (General Practice) O'Neal, Ronnald Ramp, MD as PCP - Cardiology (Cardiology) Riley Churches, RN as [facility] Care Management  Patient Active Problem List   Diagnosis Date Noted   HIV disease (HCC) 09/27/2023   Herpes zoster vaccination declined 09/27/2023   COVID-19 vaccine administered 05/25/2023   Class 1 obesity due to excess calories without serious comorbidity with body mass index (BMI) of 33.0 to 33.9 in adult 05/25/2023   Chronic gout without tophus 03/31/2023   Hypertensive heart disease with chronic diastolic congestive heart failure (HCC) 01/12/2023   Pulmonary hypertension (HCC) 01/12/2023   Asymptomatic HIV infection, with no history of HIV-related illness (HCC) 01/12/2023   HFrEF (heart failure with reduced ejection fraction) (HCC) 10/16/2022   Chronic respiratory failure with hypoxia (HCC) 10/16/2022   OSA (obstructive sleep apnea) 08/21/2022   Thyroid nodule 01/15/2020   PAD (peripheral artery disease) (HCC) 05/28/2019   Type 2 diabetes  mellitus with chronic kidney disease (HCC) 09/26/2018   Obesity (BMI 30-39.9) 09/26/2018   Chronic diastolic heart failure (HCC) 05/07/2018   Cardiomegaly    Encntr for general adult medical exam w/o abnormal findings 04/25/2018   Hearing loss 04/25/2018   LBBB (left bundle branch block) 04/25/2018   Hyperlipidemia 04/25/2018   Other long term (current) drug therapy 04/25/2018   Other specified disorders of pigmentation 04/25/2018   Personal history of noncompliance with medical treatment, presenting hazards to health 04/25/2018   Sensorineural hearing loss (SNHL) of left ear 06/14/2017   Hypertensive heart disease 06/05/2013   Pure hypercholesterolemia 06/05/2013   Vitamin D deficiency 06/05/2013   Corns and callosity 11/03/2010    Current Outpatient Medications:    allopurinol (ZYLOPRIM) 100 MG tablet, TAKE 1/2 TALBLET (50 MG) BY MOUTH EVERY OTHER DAY FOR GOUT PREVENTION, Disp: 42 tablet, Rfl: 2   amLODipine (NORVASC) 10 MG tablet, TAKE 1 TABLET BY MOUTH EVERY DAY, Disp: 90 tablet, Rfl: 2   atorvastatin (LIPITOR) 40 MG tablet, Take 1 tablet (40 mg total) by mouth daily., Disp: 30 tablet, Rfl: 11   bictegravir-emtricitabine-tenofovir AF (BIKTARVY) 50-200-25 MG TABS tablet, Take 1 tablet by mouth daily., Disp: 30 tablet, Rfl: 11   carvedilol (COREG) 3.125 MG tablet, Take 1 tablet (3.125 mg total) by mouth 2 (two) times daily., Disp: 180 tablet, Rfl: 3   empagliflozin (JARDIANCE) 10 MG TABS tablet, TAKE 1 TABLET BY MOUTH EVERY DAY,  Disp: 30 tablet, Rfl: 4   Finerenone 10 MG TABS, Take 1 tablet (10 mg total) by mouth daily., Disp: 90 tablet, Rfl: 1   losartan (COZAAR) 100 MG tablet, TAKE 1 TABLET BY MOUTH EVERY DAY, Disp: 90 tablet, Rfl: 2   metFORMIN (GLUCOPHAGE) 1000 MG tablet, TAKE 1 TABLET (1,000 MG TOTAL) BY MOUTH TWICE A DAY WITH FOOD, Disp: 180 tablet, Rfl: 1   spironolactone (ALDACTONE) 25 MG tablet, Take 0.5 tablets (12.5 mg total) by mouth daily., Disp: 45 tablet, Rfl: 3    torsemide (DEMADEX) 20 MG tablet, Take 1 tablet (20 mg total) by mouth daily., Disp: 180 tablet, Rfl: 1 No Known Allergies   Social History   Socioeconomic History   Marital status: Divorced    Spouse name: Not on file   Number of children: 2   Years of education: Not on file   Highest education level: Not on file  Occupational History   Occupation: self-employed    Comment: recieves disability  Tobacco Use   Smoking status: Former    Current packs/day: 0.00    Types: Cigarettes    Start date: 1998    Quit date: 1999    Years since quitting: 26.2   Smokeless tobacco: Never   Tobacco comments:    1 pack would last one month--08/01/18  Vaping Use   Vaping status: Never Used  Substance and Sexual Activity   Alcohol use: No   Drug use: No   Sexual activity: Not Currently    Partners: Female    Comment: declied condom  Other Topics Concern   Not on file  Social History Narrative   Not on file   Social Drivers of Health   Financial Resource Strain: Low Risk  (05/06/2023)   Overall Financial Resource Strain (CARDIA)    Difficulty of Paying Living Expenses: Not hard at all  Food Insecurity: No Food Insecurity (09/29/2023)   Hunger Vital Sign    Worried About Running Out of Food in the Last Year: Never true    Ran Out of Food in the Last Year: Never true  Transportation Needs: No Transportation Needs (09/29/2023)   PRAPARE - Administrator, Civil Service (Medical): No    Lack of Transportation (Non-Medical): No  Physical Activity: Sufficiently Active (05/06/2023)   Exercise Vital Sign    Days of Exercise per Week: 6 days    Minutes of Exercise per Session: 120 min  Stress: No Stress Concern Present (05/06/2023)   [employer] of Occupational Health - Occupational Stress Questionnaire    Feeling of Stress : Not at all  Social Connections: Moderately Isolated (05/06/2023)   Social Connection and Isolation Panel [NHANES]    Frequency of Communication with Friends  and Family: More than three times a week    Frequency of Social Gatherings with Friends and Family: More than three times a week    Attends Religious Services: Never    Database administrator or Organizations: Yes    Attends Banker Meetings: More than 4 times per year    Marital Status: Divorced  Intimate Partner Violence: Not At Risk (09/29/2023)   Humiliation, Afraid, Rape, and Kick questionnaire    Fear of Current or Ex-Partner: No    Emotionally Abused: No    Physically Abused: No    Sexually Abused: No    Physical Exam      Future Appointments  Date Time Provider Department Center  01/25/2024 10:20 AM [name], FNP  [facility] None  01/27/2024 10:30 AM Little, Karma Lew, RN CHL-POP[facility] None  02/08/2024  8:45 AM Danelle Earthly, MD [facility]  02/17/2024  9:00 AM MC ECHO OP 1 [facility]  02/17/2024 10:00 AM Sabharwal, Aditya, DO MC-[facility] None  05/10/2024 11:00 AM TIMA-ANNUAL WELLNESS VISIT [facility] None

## 2023-12-16 ENCOUNTER — Other Ambulatory Visit (HOSPITAL_COMMUNITY): Payer: Self-pay | Admitting: Emergency Medicine

## 2023-12-16 ENCOUNTER — Encounter (HOSPITAL_COMMUNITY): Payer: Self-pay | Admitting: Adult Health

## 2023-12-16 NOTE — Progress Notes (Signed)
  HF [employer] Discharge  Discussed with HF Paramedic and HF Team.   Goals achieved.Can refer again if new needs identified.   Stable for discharge. Patient aware.    Saumya Hukill NP-C  4:15 PM

## 2023-12-16 NOTE — Progress Notes (Signed)
 Paramedicine Encounter    Patient ID: Mark [name], male    DOB: 10/27/1960, 63 y.o.   MRN: [id number]   Complaints NONE  Assessment A&O x 4, skin W&D w/ good color. Denies chest pain or SOB.  Lung sounds clear throughout.  No peripheral edema noted.  Compliance with meds YES  Pill box filled N/A  Refills needed Carvedilol  Meds changes since last visit NONE    Social changes NONE   BP 118/80 (BP Location: Left Arm, Patient Position: Sitting, Cuff Size: Normal)   Pulse 70   Resp 16   Wt 237 lb (107.5 kg)   SpO2 99%   BMI 34.01 kg/m  Weight yesterday- not taken Last visit weight-240lb  [name] A&O x 4, skin W&D w/ good color.  Pt denies chest pain or SOB.  Lung sounds clear and equal bilat.  No peripheral edema noted.  During today's visit, discussed graduating him from the paramedicine program.  He has been a most cooperative and compliant patient with appoints, meds and nutritional choices.  Discharge notes listed below visit info.  ACTION: Home visit completed   Carlton Chick [phone] 12/16/23  Patient Care Team: Susanna Epley, FNP as PCP - General (General Practice) O'Neal, Cathay Clonts, MD as PCP - Cardiology (Cardiology) Kaylene Pascal, RN as [facility] Care Management  Patient Active Problem List   Diagnosis Date Noted   HIV disease (HCC) 09/27/2023   Herpes zoster vaccination declined 09/27/2023   COVID-19 vaccine administered 05/25/2023   Class 1 obesity due to excess calories without serious comorbidity with body mass index (BMI) of 33.0 to 33.9 in adult 05/25/2023   Chronic gout without tophus 03/31/2023   Hypertensive heart disease with chronic diastolic congestive heart failure (HCC) 01/12/2023   Pulmonary hypertension (HCC) 01/12/2023   Asymptomatic HIV infection, with no history of HIV-related illness (HCC) 01/12/2023   HFrEF (heart failure with reduced ejection fraction) (HCC) 10/16/2022   Chronic respiratory failure  with hypoxia (HCC) 10/16/2022   OSA (obstructive sleep apnea) 08/21/2022   Thyroid nodule 01/15/2020   PAD (peripheral artery disease) (HCC) 05/28/2019   Type 2 diabetes mellitus with chronic kidney disease (HCC) 09/26/2018   Obesity (BMI 30-39.9) 09/26/2018   Chronic diastolic heart failure (HCC) 05/07/2018   Cardiomegaly    Encntr for general adult medical exam w/o abnormal findings 04/25/2018   Hearing loss 04/25/2018   LBBB (left bundle branch block) 04/25/2018   Hyperlipidemia 04/25/2018   Other long term (current) drug therapy 04/25/2018   Other specified disorders of pigmentation 04/25/2018   Personal history of noncompliance with medical treatment, presenting hazards to health 04/25/2018   Sensorineural hearing loss (SNHL) of left ear 06/14/2017   Hypertensive heart disease 06/05/2013   Pure hypercholesterolemia 06/05/2013   Vitamin D deficiency 06/05/2013   Corns and callosity 11/03/2010    Current Outpatient Medications:    allopurinol (ZYLOPRIM) 100 MG tablet, TAKE 1/2 TALBLET (50 MG) BY MOUTH EVERY OTHER DAY FOR GOUT PREVENTION, Disp: 42 tablet, Rfl: 2   amLODipine (NORVASC) 10 MG tablet, TAKE 1 TABLET BY MOUTH EVERY DAY, Disp: 90 tablet, Rfl: 2   atorvastatin (LIPITOR) 40 MG tablet, Take 1 tablet (40 mg total) by mouth daily., Disp: 30 tablet, Rfl: 11   bictegravir-emtricitabine-tenofovir AF (BIKTARVY) 50-200-25 MG TABS tablet, Take 1 tablet by mouth daily., Disp: 30 tablet, Rfl: 11   carvedilol (COREG) 3.125 MG tablet, Take 1 tablet (3.125 mg total) by mouth 2 (two) times daily., Disp: 180  tablet, Rfl: 3   empagliflozin (JARDIANCE) 10 MG TABS tablet, TAKE 1 TABLET BY MOUTH EVERY DAY, Disp: 30 tablet, Rfl: 4   Finerenone 10 MG TABS, Take 1 tablet (10 mg total) by mouth daily., Disp: 90 tablet, Rfl: 1   losartan (COZAAR) 100 MG tablet, TAKE 1 TABLET BY MOUTH EVERY DAY, Disp: 90 tablet, Rfl: 2   metFORMIN (GLUCOPHAGE) 1000 MG tablet, TAKE 1 TABLET (1,000 MG TOTAL) BY MOUTH  TWICE A DAY WITH FOOD, Disp: 180 tablet, Rfl: 1   spironolactone (ALDACTONE) 25 MG tablet, Take 0.5 tablets (12.5 mg total) by mouth daily., Disp: 45 tablet, Rfl: 3   torsemide (DEMADEX) 20 MG tablet, Take 1 tablet (20 mg total) by mouth daily., Disp: 180 tablet, Rfl: 1 No Known Allergies   Social History   Socioeconomic History   Marital status: Divorced    Spouse name: Not on file   Number of children: 2   Years of education: Not on file   Highest education level: Not on file  Occupational History   Occupation: self-employed    Comment: recieves disability  Tobacco Use   Smoking status: Former    Current packs/day: 0.00    Types: Cigarettes    Start date: 1998    Quit date: 1999    Years since quitting: 26.3   Smokeless tobacco: Never   Tobacco comments:    1 pack would last one month--08/01/18  Vaping Use   Vaping status: Never Used  Substance and Sexual Activity   Alcohol use: No   Drug use: No   Sexual activity: Not Currently    Partners: Female    Comment: declied condom  Other Topics Concern   Not on file  Social History Narrative   Not on file   Social Drivers of Health   Financial Resource Strain: Low Risk  (05/06/2023)   Overall Financial Resource Strain (CARDIA)    Difficulty of Paying Living Expenses: Not hard at all  Food Insecurity: No Food Insecurity (09/29/2023)   Hunger Vital Sign    Worried About Running Out of Food in the Last Year: Never true    Ran Out of Food in the Last Year: Never true  Transportation Needs: No Transportation Needs (09/29/2023)   PRAPARE - Administrator, Civil Service (Medical): No    Lack of Transportation (Non-Medical): No  Physical Activity: Sufficiently Active (05/06/2023)   Exercise Vital Sign    Days of Exercise per Week: 6 days    Minutes of Exercise per Session: 120 min  Stress: No Stress Concern Present (05/06/2023)   [employer] of Occupational Health - Occupational Stress Questionnaire    Feeling  of Stress : Not at all  Social Connections: Moderately Isolated (05/06/2023)   Social Connection and Isolation Panel [NHANES]    Frequency of Communication with Friends and Family: More than three times a week    Frequency of Social Gatherings with Friends and Family: More than three times a week    Attends Religious Services: Never    Database administrator or Organizations: Yes    Attends Engineer, structural: More than 4 times per year    Marital Status: Divorced  Intimate Partner Violence: Not At Risk (09/29/2023)   Humiliation, Afraid, Rape, and Kick questionnaire    Fear of Current or Ex-Partner: No    Emotionally Abused: No    Physically Abused: No    Sexually Abused: No    Physical Exam  Future Appointments  Date Time Provider Department Center  01/25/2024 10:20 AM Susanna Epley, FNP [facility] None  01/27/2024 10:30 AM Little, Skeeter Dukes, RN CHL-POPH None  02/08/2024  8:45 AM Orlie Bjornstad, MD [facility]  02/17/2024  9:00 AM MC ECHO OP 1 [facility]-ECHOL[facility]  02/17/2024 10:00 AM Alwin Baars, DO MC-HVSC None  05/10/2024 11:00 AM TIMA-ANNUAL WELLNESS VISIT [facility] None       Patient is now discharged from [employer].  Patient has/has not met the following goals:  Yes :Patient expresses basic understanding of medications and what they are for Yes :Patient able to verbalize heart failure specific dietary/fluid restrictions Yes :Patient is aware of who to call if they have medical concerns or if they need to schedule or change appts Yes :Patient has a scale for daily weights and weighs regularly Yes :Patient able to verbalize concerning symptoms when they should call the HF clinic (weight gain ranges, etc) Yes :Patient has a PCP and has seen within the past year or has upcoming appt Yes :Patient has reliable access to getting their medications Yes :Patient has shown they are able to reorder medications reliably No :Patient has had admission  in past 30 days- if yes how many? No :Patient has had admission in past 90 days- if yes how many?  Discharge Comments:  It has been a pleasure assisting Mark [name] in the [employer].  He has maintained a positive attitude and has actively participated in his weekly visits.   He demonstrates a grasp of his meds, what they are for and is med compliant.  He goes to the gym and works out daily.  He is conscientious w/ his med compliance as well as wearing his CPAP.   Advised pt that he could reach out to me at any time he should have questions or need assistance.  Also saved number for [facility] in his phone so that he would have that readily available as well.    Ermalinda Hays, EMT-Paramedic [phone] 12/16/2023

## 2023-12-20 DIAGNOSIS — R809 Proteinuria, unspecified: Secondary | ICD-10-CM | POA: Diagnosis not present

## 2023-12-20 DIAGNOSIS — E1122 Type 2 diabetes mellitus with diabetic chronic kidney disease: Secondary | ICD-10-CM | POA: Diagnosis not present

## 2023-12-20 DIAGNOSIS — I5032 Chronic diastolic (congestive) heart failure: Secondary | ICD-10-CM | POA: Diagnosis not present

## 2023-12-20 DIAGNOSIS — I129 Hypertensive chronic kidney disease with stage 1 through stage 4 chronic kidney disease, or unspecified chronic kidney disease: Secondary | ICD-10-CM | POA: Diagnosis not present

## 2023-12-20 DIAGNOSIS — N1832 Chronic kidney disease, stage 3b: Secondary | ICD-10-CM | POA: Diagnosis not present

## 2023-12-21 ENCOUNTER — Other Ambulatory Visit: Payer: Self-pay | Admitting: Nephrology

## 2023-12-21 DIAGNOSIS — N1832 Chronic kidney disease, stage 3b: Secondary | ICD-10-CM

## 2023-12-21 LAB — LAB REPORT - SCANNED
Creatinine, POC: 93.6 mg/dL
EGFR: 26

## 2023-12-30 ENCOUNTER — Ambulatory Visit
Admission: RE | Admit: 2023-12-30 | Discharge: 2023-12-30 | Disposition: A | Discharge status: Home / Self Care | Source: Ambulatory Visit | Attending: Nephrology | Admitting: Nephrology

## 2023-12-30 DIAGNOSIS — N183 Chronic kidney disease, stage 3 unspecified: Secondary | ICD-10-CM | POA: Diagnosis not present

## 2023-12-30 DIAGNOSIS — N1832 Chronic kidney disease, stage 3b: Secondary | ICD-10-CM

## 2024-01-03 ENCOUNTER — Other Ambulatory Visit: Payer: Self-pay | Admitting: General Practice

## 2024-01-13 NOTE — Progress Notes (Signed)
 The 10-year ASCVD risk score (Arnett DK, et al., 2019) is: 25%   Values used to calculate the score:     Age: 63 years     Sex: Male     Is Non-Hispanic African American: Yes     Diabetic: Yes     Tobacco smoker: No     Systolic Blood Pressure: 118 mmHg     Is BP treated: Yes     HDL Cholesterol: 46 mg/dL     Total Cholesterol: 187 mg/dL  Currently prescribed atorvastatin  40 mg.   Mark [name], BSN, RN

## 2024-01-14 ENCOUNTER — Other Ambulatory Visit (HOSPITAL_COMMUNITY): Payer: Self-pay | Admitting: Cardiology

## 2024-01-25 ENCOUNTER — Encounter: Payer: Self-pay | Admitting: Nurse Practitioner

## 2024-01-25 ENCOUNTER — Ambulatory Visit (INDEPENDENT_AMBULATORY_CARE_PROVIDER_SITE_OTHER): Payer: Medicare HMO | Admitting: Nurse Practitioner

## 2024-01-25 VITALS — BP 116/70 | HR 59 | Temp 98.9°F | Ht 70.0 in | Wt 237.5 lb

## 2024-01-25 DIAGNOSIS — E66811 Obesity, class 1: Secondary | ICD-10-CM | POA: Diagnosis not present

## 2024-01-25 DIAGNOSIS — E782 Mixed hyperlipidemia: Secondary | ICD-10-CM | POA: Diagnosis not present

## 2024-01-25 DIAGNOSIS — Z5320 Procedure and treatment not carried out because of patient's decision for unspecified reasons: Secondary | ICD-10-CM

## 2024-01-25 DIAGNOSIS — Z2821 Immunization not carried out because of patient refusal: Secondary | ICD-10-CM | POA: Diagnosis not present

## 2024-01-25 DIAGNOSIS — Z6834 Body mass index (BMI) 34.0-34.9, adult: Secondary | ICD-10-CM

## 2024-01-25 DIAGNOSIS — E1122 Type 2 diabetes mellitus with diabetic chronic kidney disease: Secondary | ICD-10-CM

## 2024-01-25 DIAGNOSIS — N1832 Chronic kidney disease, stage 3b: Secondary | ICD-10-CM | POA: Diagnosis not present

## 2024-01-25 DIAGNOSIS — Z794 Long term (current) use of insulin: Secondary | ICD-10-CM

## 2024-01-25 DIAGNOSIS — I272 Pulmonary hypertension, unspecified: Secondary | ICD-10-CM

## 2024-01-25 NOTE — Progress Notes (Signed)
 Mark Mark [name], CMA,acting as a Neurosurgeon for Mark Epley, FNP.,have documented all relevant documentation on the behalf of Mark Epley, FNP,as directed by  Mark Epley, FNP while in the presence of Mark Epley, FNP.  Subjective:  Patient ID: Mark Mark [name] , male    DOB: 18-Mar-1961 , 63 y.o.   MRN: [id number]  Chief Complaint  Patient presents with   Hypertension    Patient presents today for a bp and dm follow up, Patient reports compliance with medication. Patient denies any chest pain, SOB, or headaches. Patient has no concerns today.    Diabetes    Patient declines getting an eye exam.     HPI  He is changed to Kerendia  by the Cardiologist and continues to be on Spironolactone . There was a message sent to Cardiology to determine if he should be on both. [facility]   [name] is here for a follow-up appointment. Since the last visit, [name] has seen a kidney doctor and a cardiologist nurse practitioner. There is a discussion about potentially changing [name]'s kidney medication, he is now on Kerendia  (new) and spironolactone , a message has been sent by Nephrology to Cardiology to determine which one he should be taking. The patient reports, 'She was telling me that she going tell Dr. So that you going to change my kidney medicine.' [name] is hesitant about getting a diabetic eye exam, stating, 'I don't trust no doctors to mess up with my eyes no more because they blind me with my eyes open.'   [name] has completed a cologuard test and received results, which were reported as good. He will provide the letter indicating it is normal. [facility]' except when there are appointments or early closures. [name] reports following a healthy diet, stating, 'My eating is good.'     Past Medical History:  Diagnosis Date   Abnormal lung function test    a. Reported to possibly be COPD, but per pulm note: "Possible COPD -  PFT was more suggestive of restrictive defect and diffusion defect likely from obesity and CHF"   CHF (congestive heart failure) (HCC)    Chronic diastolic heart failure (HCC) 05/07/2018   CKD (chronic kidney disease), stage III (HCC)    Diabetes mellitus type 2 in obese    Elevated troponin 05/07/2018   Hearing impaired    Hyperlipidemia    Hypertension    LBBB (left bundle branch block)    LVH (left ventricular hypertrophy)    Mild aortic stenosis    Mild pulmonary hypertension (HCC)    Morbid obesity (HCC)    Pulmonary hypertension (HCC)    Sleep apnea      Family History  Problem Relation Age of Onset   Hypertension Mother    Hypertension Father      Current Outpatient Medications:    allopurinol  (ZYLOPRIM ) 100 MG tablet, TAKE 1/2 TALBLET (50 MG) BY MOUTH EVERY OTHER DAY FOR GOUT PREVENTION, Disp: 42 tablet, Rfl: 2   amLODipine  (NORVASC ) 10 MG tablet, TAKE 1 TABLET BY MOUTH EVERY DAY, Disp: 90 tablet, Rfl: 2   atorvastatin  (LIPITOR) 40 MG tablet, Take 1 tablet (40 mg total) by mouth daily., Disp: 30 tablet, Rfl: 11   bictegravir-emtricitabine-tenofovir AF (BIKTARVY ) 50-200-25 MG TABS tablet, Take 1 tablet by mouth daily., Disp: 30 tablet, Rfl: 11   carvedilol  (COREG ) 3.125 MG tablet, Take 1 tablet (3.125 mg total) by mouth 2 (two)  times daily., Disp: 180 tablet, Rfl: 3   empagliflozin  (JARDIANCE ) 10 MG TABS tablet, TAKE 1 TABLET BY MOUTH EVERY DAY, Disp: 30 tablet, Rfl: 0   Finerenone  (KERENDIA ) 10 MG TABS, TAKE 1 TABLET BY MOUTH EVERY DAY, Disp: 90 tablet, Rfl: 3   losartan  (COZAAR ) 100 MG tablet, TAKE 1 TABLET BY MOUTH EVERY DAY, Disp: 90 tablet, Rfl: 2   metFORMIN  (GLUCOPHAGE ) 1000 MG tablet, TAKE 1 TABLET (1,000 MG TOTAL) BY MOUTH TWICE A DAY WITH FOOD, Disp: 180 tablet, Rfl: 1   spironolactone  (ALDACTONE ) 25 MG tablet, Take 0.5 tablets (12.5 mg total) by mouth daily., Disp: 45 tablet, Rfl: 3   torsemide  (DEMADEX ) 20 MG tablet, Take 1 tablet (20 mg total) by mouth daily.,  Disp: 180 tablet, Rfl: 1   No Known Allergies   Review of Systems  Constitutional: Negative.   HENT: Negative.    Eyes: Negative.   Respiratory: Negative.    Cardiovascular: Negative.   Gastrointestinal: Negative.   Neurological: Negative.   Psychiatric/Behavioral: Negative.       Today's Vitals   01/25/24 1015  BP: 116/70  Pulse: (!) 59  Temp: 98.9 F (37.2 C)  TempSrc: Oral  SpO2: 100%  Weight: 237 lb 8 oz (107.7 kg)  Height: 5\' 10"  (1.778 m)  PainSc: 0-No pain   Body mass index is 34.08 kg/m.  Wt Readings from Last 3 Encounters:  01/27/24 237 lb (107.5 kg)  01/25/24 237 lb 8 oz (107.7 kg)  12/16/23 237 lb (107.5 kg)     Objective:  Physical Exam Vitals and nursing note reviewed.  Constitutional:      General: He is not in acute distress.    Appearance: Normal appearance. He is obese.  Cardiovascular:     Rate and Rhythm: Normal rate and regular rhythm.     Pulses: Normal pulses.     Heart sounds: Normal heart sounds. No murmur Mark [name]. Pulmonary:     Effort: Pulmonary effort is normal. No respiratory distress.     Breath sounds: Normal breath sounds. No wheezing.  Skin:    General: Skin is warm and dry.     Capillary Refill: Capillary refill takes less than 2 seconds.  Neurological:     General: No focal deficit present.     Mental Status: He is alert and oriented to person, place, and time.     Cranial Nerves: No cranial nerve deficit.     Motor: No weakness.  Psychiatric:        Mood and Affect: Mood normal.        Behavior: Behavior normal.        Thought Content: Thought content normal.        Judgment: Judgment normal.      Assessment And Plan:  Pulmonary hypertension (HCC) [I27.20] Assessment & Plan: Blood pressure is controlled, repeat had improved blood pressure. Continue f/u with Cardiology.    Type 2 diabetes mellitus with stage 3b chronic kidney disease, with long-term current use of insulin  [facility]) Assessment & Plan: hgbA1c is stable.  Continue current medications.   Orders: -     BMP8+eGFR -     Hemoglobin A1c  Mixed hyperlipidemia Assessment & Plan: Cholesterol levels are stable, continue statin. Tolerating well.   Orders: -     Lipid panel  Herpes zoster vaccination declined Assessment & Plan: Declines shingrix, educated on disease process and is aware if he changes his mind to notify office    Class 1 obesity with body mass  index (BMI) of 34.0 to 34.9 in adult, unspecified obesity type, unspecified whether serious comorbidity present Assessment & Plan: He is encouraged to strive for BMI less than 30 to decrease cardiac risk. Advised to aim for at least 150 minutes of exercise per week. [facility] Encourage continuation of this healthy habit.   Eye examination declined Assessment & Plan: I have explained to him multiple times the importance of a diabetic eye exam, he has had several referrals placed and has not followed through     Return in about 4 months (around 05/27/2024) for bp and dm check.  Patient was given opportunity to ask questions. Patient verbalized understanding of the plan and was able to repeat key elements of the plan. All questions were answered to their satisfaction.    Inge Mangle, FNP, have reviewed all documentation for this visit. The documentation on 01/25/24 for the exam, diagnosis, procedures, and orders are all accurate and complete.   IF YOU HAVE BEEN REFERRED TO A SPECIALIST, IT MAY TAKE 1-2 WEEKS TO SCHEDULE/PROCESS THE REFERRAL. IF YOU HAVE NOT Mark [name] FROM US /SPECIALIST IN TWO WEEKS, PLEASE GIVE US  A CALL AT [phone].

## 2024-01-25 NOTE — Assessment & Plan Note (Signed)
 He is encouraged to strive for BMI less than 30 to decrease cardiac risk. Advised to aim for at least 150 minutes of exercise per week. [facility] Encourage continuation of this healthy habit.

## 2024-01-26 LAB — BMP8+EGFR
BUN/Creatinine Ratio: 15 (ref 10–24)
BUN: 37 mg/dL — ABNORMAL HIGH (ref 8–27)
CO2: 19 mmol/L — ABNORMAL LOW (ref 20–29)
Calcium: 9.6 mg/dL (ref 8.6–10.2)
Chloride: 108 mmol/L — ABNORMAL HIGH (ref 96–106)
Creatinine, Ser: 2.54 mg/dL — ABNORMAL HIGH (ref 0.76–1.27)
Glucose: 84 mg/dL (ref 70–99)
Potassium: 4.5 mmol/L (ref 3.5–5.2)
Sodium: 141 mmol/L (ref 134–144)
eGFR: 27 mL/min/{1.73_m2} — ABNORMAL LOW (ref >59)

## 2024-01-26 LAB — LIPID PANEL
Chol/HDL Ratio: 3.7 ratio (ref 0.0–5.0)
Cholesterol, Total: 155 mg/dL (ref 100–199)
HDL: 42 mg/dL (ref >39)
LDL Chol Calc (NIH): 94 mg/dL (ref 0–99)
Triglycerides: 103 mg/dL (ref 0–149)
VLDL Cholesterol Cal: 19 mg/dL (ref 5–40)

## 2024-01-26 LAB — HEMOGLOBIN A1C
Est. average glucose Bld gHb Est-mCnc: 111 mg/dL
Hgb A1c MFr Bld: 5.5 % (ref 4.8–5.6)

## 2024-01-27 ENCOUNTER — Ambulatory Visit: Payer: Medicare HMO

## 2024-01-27 NOTE — Patient Outreach (Signed)
 Complex Care Management   Visit Note  01/27/2024  Name:  Mark [name] MRN: [id number] DOB: 1961-07-12  Situation: Referral received for Complex Care Management related to Heart Failure, OSA w/CPAP, type 2 Diabetes, Hypertensive Heart Disease, and Chronic Kidney Disease. I obtained verbal consent from Patient.  Visit completed with patient on the phone.  Background:   Past Medical History:  Diagnosis Date   Abnormal lung function test    a. Reported to possibly be COPD, but per pulm note: "Possible COPD - PFT was more suggestive of restrictive defect and diffusion defect likely from obesity and CHF"   CHF (congestive heart failure) (HCC)    Chronic diastolic heart failure (HCC) 05/07/2018   CKD (chronic kidney disease), stage III (HCC)    Diabetes mellitus type 2 in obese    Elevated troponin 05/07/2018   Hearing impaired    Hyperlipidemia    Hypertension    LBBB (left bundle branch block)    LVH (left ventricular hypertrophy)    Mild aortic stenosis    Mild pulmonary hypertension (HCC)    Morbid obesity (HCC)    Pulmonary hypertension (HCC)    Sleep apnea     Assessment: Patient Reported Symptoms:  Cognitive Cognitive Status: Alert and oriented to person, place, and time Cognitive/Intellectual Conditions Management [RPT]: None reported or documented in medical history or problem list   Health Maintenance Behaviors: Annual physical exam, Sleep adequate, Immunizations, Healthy diet, Exercise, Social activities Healing Pattern: Fast Health Facilitated by: Healthy diet, Rest  Neurological Neurological Review of Symptoms: No symptoms reported    HEENT HEENT Symptoms Reported: Change or loss of hearing HEENT Conditions: Ear problem(s) HEENT Management Strategies: Medical device, Routine screening HEENT Self-Management Outcome: 4 (good) Ear problem(s)  Cardiovascular Cardiovascular Symptoms Reported: No symptoms reported Does patient have uncontrolled Hypertension?:  No Cardiovascular Conditions: High blood cholesterol, Hypertension, Heart failure (PAD) Cardiovascular Management Strategies: Medication therapy, Routine screening, Exercise, Diet modification Weight: 237 lb (107.5 kg) Cardiovascular Self-Management Outcome: 4 (good)  Respiratory Respiratory Symptoms Reported: No symptoms reported Respiratory Conditions: Sleep disordered breathing Respiratory Self-Management Outcome: 4 (good)  Endocrine Patient reports the following symptoms related to hypoglycemia or hyperglycemia : No symptoms reported Is patient diabetic?: Yes Is patient checking blood sugars at home?: No Endocrine Conditions: Diabetes Endocrine Management Strategies: Medication therapy, Routine screening, Diet modification, Exercise Endocrine Self-Management Outcome: 4 (good)  Gastrointestinal Gastrointestinal Symptoms Reported: No symptoms reported   Nutrition Risk Screen (CP): No indicators present  Genitourinary Genitourinary Symptoms Reported: No symptoms reported Genitourinary Conditions: Chronic kidney disease Genitourinary Management Strategies: Medication therapy, Fluid modification (routine screening) Genitourinary Self-Management Outcome: 4 (good) Genitourinary Comment: Patient is established with Dr. Yvonnie Heritage with [facility]  Integumentary Integumentary Symptoms Reported: No symptoms reported    Musculoskeletal Musculoskelatal Symptoms Reviewed: No symptoms reported   Falls in the past year?: No Number of falls in past year: 1 or less Was there an injury with Fall?: No Fall Risk Category Calculator: 0 Patient Fall Risk Level: Low Fall Risk Patient at Risk for Falls Due to: No Fall Risks Fall risk Follow up: Falls evaluation completed  Psychosocial Psychosocial Symptoms Reported: No symptoms reported   Major Change/Loss/Stressor/Fears (CP): Denies Techniques to Cope with Loss/Stress/Change: Exercise Quality of Family Relationships: supportive, involved Do you  feel physically threatened by others?: No      01/27/2024   11:01 AM  Depression screen PHQ 2/9  Decreased Interest 0  Down, Depressed, Hopeless 0  PHQ - 2 Score 0  Vitals:   01/27/24 1041  BP: 115/70    Medications Reviewed Today     Reviewed by Kaylene Pascal, RN (Registered Nurse) on 01/27/24 at 1045  Med List Status: <None>   Medication Order Taking? Sig Documenting Provider Last Dose Status Informant  allopurinol  (ZYLOPRIM ) 100 MG tablet [id number] Yes TAKE 1/2 TALBLET (50 MG) BY MOUTH EVERY OTHER DAY FOR GOUT PREVENTION Melodie Spry, NP Taking Active   amLODipine  (NORVASC ) 10 MG tablet [id number] Yes TAKE 1 TABLET BY MOUTH EVERY DAY Sheryl Donna, NP Taking Active   atorvastatin  (LIPITOR) 40 MG tablet [id number] Yes Take 1 tablet (40 mg total) by mouth daily. Sabharwal, Aditya, DO Taking Active   bictegravir-emtricitabine-tenofovir AF (BIKTARVY ) 50-200-25 MG TABS tablet [id number] Yes Take 1 tablet by mouth daily. Ernie Heal, Jerelyn Money, MD Taking Active   carvedilol  (COREG ) 3.125 MG tablet [id number] Yes Take 1 tablet (3.125 mg total) by mouth 2 (two) times daily. Alwin Baars, DO Taking Active   empagliflozin  (JARDIANCE ) 10 MG TABS tablet [id number] Yes TAKE 1 TABLET BY MOUTH EVERY DAY Carie Charity, NP Taking Active   Finerenone  (KERENDIA ) 10 MG TABS [id number] Yes TAKE 1 TABLET BY MOUTH EVERY DAY Sabharwal, Aditya, DO Taking Active   losartan  (COZAAR ) 100 MG tablet [id number] Yes TAKE 1 TABLET BY MOUTH EVERY DAY Sabharwal, Aditya, DO Taking Active   metFORMIN  (GLUCOPHAGE ) 1000 MG tablet [id number] Yes TAKE 1 TABLET (1,000 MG TOTAL) BY MOUTH TWICE A DAY WITH FOOD Susanna Epley, FNP Taking Active   spironolactone  (ALDACTONE ) 25 MG tablet [id number] Yes Take 0.5 tablets (12.5 mg total) by mouth daily. Sabharwal, Aditya, DO Taking Active   torsemide  (DEMADEX ) 20 MG tablet [id number] Yes Take 1 tablet (20 mg total) by mouth daily. Alwin Baars, DO Taking Active              Recommendation:   PCP Follow-up  Follow Up Plan:   Telephone follow up appointment date/time:  Thursday, July 10 at 10:30 AN  Louanne Roussel RN BSN CCM [facility]  [facility], [name] Nurse Care Coordinator  Direct Dial: [phone] Website: [email or web address]

## 2024-01-27 NOTE — Patient Instructions (Signed)
 Visit Information  Thank you for taking time to visit with me today. Please don't hesitate to contact me if I can be of assistance to you before our next scheduled appointment.  Our next appointment is by telephone on Thursday, July 10 at 10:30 AM Please call the care guide team at [phone] if you need to cancel or reschedule your appointment.   Following is a copy of your care plan:   Goals Addressed               This Visit's Progress       Other     COMPLETED: To better manage Gout        Care Coordination Interventions: Evaluation of current treatment plan related to gout and patient's adherence to plan as established by provider Reviewed medications with patient and discussed importance of medication adherence Assessment completed, patient denies having any symptoms related to Gout at this time and is adherent to taking his prescribed medications      COMPLETED: To work on lowering Cholesterol        Care Coordination Interventions: Provider established cholesterol goals reviewed Review of patient status, including review of consultant's reports, relevant laboratory and other test results, and medications completed Discussed patient is exercising 7 days per week, for approximately 1-2 hours each day Positive reinforcement given to patient for making efforts to improve his overall health  Lipid Panel     Component Value Date/Time   CHOL 155 01/25/2024 1055   TRIG 103 01/25/2024 1055   HDL 42 01/25/2024 1055   CHOLHDL 3.7 01/25/2024 1055        VBCI RN Care Plan related to CHF   On track     Problems:  Chronic Disease Management support and education needs related to CHF  Goal: Over the next 90 days the Patient will continue to work with Medical illustrator and/or Social Worker to address care management and care coordination needs related to CHF as evidenced by adherence to care management team scheduled appointments      Interventions:   Heart Failure  Interventions: Advised patient to weigh each morning after emptying bladder Discussed importance of daily weight and advised patient to weigh and record daily Discussed the importance of keeping all appointments with provider Provided patient with education about the role of exercise in the management of heart failure Assessed social determinant of health barriers   Patient Self-Care Activities:  Attend all scheduled provider appointments Call pharmacy for medication refills 3-7 days in advance of running out of medications Call provider office for new concerns or questions  Take medications as prescribed   Work with the nurse care manager to address care coordination needs and will continue to work with the clinical team to address health care and disease management related needs call office if I gain more than 2 pounds in one day or 5 pounds in one week use salt in moderation watch for swelling in feet, ankles and legs every day weigh myself daily develop a rescue plan follow rescue plan if symptoms flare-up track symptoms and what helps feel better or worse  Plan:  Telephone follow up appointment with care management team member scheduled for:  Thursday, July 10 at 10:30 AM       VBCI RN Care Plan related to CKD stage 4   On track     Problems:  Chronic Disease Management support and education needs related to CKD Stage 4  Goal: Over the next 90 days  the Patient will continue to work with RN Care Manager and/or Social Worker to address care management and care coordination needs related to CKD Stage 4 as evidenced by adherence to care management team scheduled appointments      Interventions:    Chronic Kidney Disease Interventions: Assessed the Patient understanding of chronic kidney disease    Evaluation of current treatment plan related to chronic kidney disease self management and patient's adherence to plan as established by provider      Educated patient about the  importance of staying well hydrated with water, aiming for 48-64 oz daily unless otherwise directed Counseled patient on the importance of keeping all scheduled follow up visits with kidney specialist   Provided education on kidney disease progression    Discussed plans with patient for ongoing nurse care management follow up and provided patient with direct contact information for nurse case management  Last practice recorded BP readings:  BP Readings from Last 3 Encounters:  01/27/24 115/70  01/25/24 116/70  12/16/23 118/80   Most recent eGFR/CrCl:  Lab Results  Component Value Date   EGFR 27 (L) 01/25/2024    No components found for: "CRCL"  Patient Self-Care Activities:  Attend all scheduled provider appointments Call pharmacy for medication refills 3-7 days in advance of running out of medications Call provider office for new concerns or questions  Take medications as prescribed   Work with the nurse care manager to address care coordination needs and will continue to work with the clinical team to address health care and disease management related needs Continue to drink 48-64 oz of water daily unless otherwise directed by your doctor   Plan:  Telephone follow up appointment with care management team member scheduled for:  Thursday, July 10 at 10:30 AM         Please call 1-800-273-TALK (toll free, 24 hour hotline) if you are experiencing a Mental Health or Behavioral Health Crisis or need someone to talk to.  The patient verbalized understanding of instructions, educational materials, and care plan provided today and DECLINED offer to receive copy of patient instructions, educational materials, and care plan.   Louanne Roussel RN BSN CCM [place]  [facility], [facility] Nurse Care Coordinator  Direct Dial: [phone] Website: [email or web address]

## 2024-01-29 ENCOUNTER — Ambulatory Visit: Payer: Self-pay | Admitting: Nurse Practitioner

## 2024-01-29 NOTE — Assessment & Plan Note (Signed)
 hgbA1c is stable. Continue current medications.

## 2024-01-29 NOTE — Assessment & Plan Note (Signed)
 Blood pressure is controlled, repeat had improved blood pressure. Continue f/u with Cardiology.

## 2024-01-29 NOTE — Assessment & Plan Note (Signed)
 Cholesterol levels are stable, continue statin. Tolerating well.

## 2024-01-29 NOTE — Assessment & Plan Note (Signed)
 Declines shingrix, educated on disease process and is aware if he changes his mind to notify office

## 2024-01-29 NOTE — Assessment & Plan Note (Signed)
 I have explained to him multiple times the importance of a diabetic eye exam, he has had several referrals placed and has not followed through

## 2024-01-30 ENCOUNTER — Other Ambulatory Visit: Payer: Self-pay | Admitting: General Practice

## 2024-01-31 ENCOUNTER — Ambulatory Visit (HOSPITAL_COMMUNITY)

## 2024-01-31 ENCOUNTER — Encounter (HOSPITAL_COMMUNITY)

## 2024-02-02 ENCOUNTER — Other Ambulatory Visit: Payer: Self-pay

## 2024-02-02 ENCOUNTER — Ambulatory Visit

## 2024-02-03 ENCOUNTER — Other Ambulatory Visit: Payer: Self-pay | Admitting: Nurse Practitioner

## 2024-02-03 DIAGNOSIS — E1159 Type 2 diabetes mellitus with other circulatory complications: Secondary | ICD-10-CM

## 2024-02-07 ENCOUNTER — Ambulatory Visit: Payer: Medicare HMO | Admitting: Internal Medicine

## 2024-02-08 ENCOUNTER — Encounter: Payer: Self-pay | Admitting: Internal Medicine

## 2024-02-08 ENCOUNTER — Ambulatory Visit: Payer: Medicare HMO | Admitting: Internal Medicine

## 2024-02-08 ENCOUNTER — Other Ambulatory Visit: Payer: Self-pay

## 2024-02-08 VITALS — BP 130/85 | HR 62 | Temp 98.3°F | Wt 235.0 lb

## 2024-02-08 DIAGNOSIS — B20 Human immunodeficiency virus [HIV] disease: Secondary | ICD-10-CM | POA: Diagnosis not present

## 2024-02-08 MED ORDER — BIKTARVY 50-200-25 MG PO TABS: 1.0000 | ORAL_TABLET | Freq: Every day | ORAL | 11 refills | Status: DC

## 2024-02-08 NOTE — Progress Notes (Signed)
 [facility]     HPI: Mark [name] is a 63 y.o.  male presents for HIV management.Dx HIV on 12/23.  Hx He was hospitalized in December for HF exacerbation found to e HIV+ 12/26 cd4 574, VL 15.1K. Today: doing well .  No missed doses. Pt has a girlfriend, he is sexually active and uses a condom every time.  ART exposure none Past OIs Risk factors: heterosexual Social: Occupation: Chief Technology Officer: apt by himself Support: has family, none know about HIV diagnosis Understanding of HIV: poor Etoh/drug/tobacco use: no/no/n Today :: Discussed the use of AI scribe software for clinical note transcription with the patient, who gave verbal consent to proceed.     The patient, with a history of HIV, heart disease, and lung disease, presents for routine follow-up. He reports adherence to his current antiretroviral therapy, Biktarvy , with no missed doses. He denies any recent changes in housing or employment. The patient has noticed the doctor's recent weight loss and inquires about his diet and exercise regimen.   The patient's HIV has been well controlled, with a nondetectable viral load at the last visit. The patient prefers to continue with his current oral medication.   The patient also has a history of heart and lung disease and is under the care of specialists for these conditions. He has not seen a nephrologist, despite a history of chronic kidney disease (CKD) and recent labs showing decreased kidney function.   The patient is up to date on his vaccinations, including the flu and COVID-19 vaccines. He also completed a home colon cancer screening test earlier this year, which was positive.      Past Medical History:  Diagnosis Date   Abnormal lung function test    a. Reported to possibly be COPD, but per pulm note: "Possible COPD - PFT was more suggestive of restrictive defect and diffusion defect likely from obesity and CHF"   CHF (congestive heart  failure) (HCC)    Chronic diastolic heart failure (HCC) 05/07/2018   CKD (chronic kidney disease), stage III (HCC)    Diabetes mellitus type 2 in obese    Elevated troponin 05/07/2018   Hearing impaired    Hyperlipidemia    Hypertension    LBBB (left bundle branch block)    LVH (left ventricular hypertrophy)    Mild aortic stenosis    Mild pulmonary hypertension (HCC)    Morbid obesity (HCC)    Pulmonary hypertension (HCC)    Sleep apnea     Past Surgical History:  Procedure Laterality Date   BACK SURGERY     RIGHT HEART CATH N/A 11/06/2022   Procedure: RIGHT HEART CATH;  Surgeon: Alwin Baars, DO;  Location: [facility] INVASIVE CV LAB;  Service: Cardiovascular;  Laterality: N/A;   TRACHEOSTOMY      Family History  Problem Relation Age of Onset   Hypertension Mother    Hypertension Father    Current Outpatient Medications on File Prior to Visit  Medication Sig Dispense Refill   allopurinol  (ZYLOPRIM ) 100 MG tablet TAKE 1/2 TALBLET (50 MG) BY MOUTH EVERY OTHER DAY FOR GOUT PREVENTION 42 tablet 2   amLODipine  (NORVASC ) 10 MG tablet TAKE 1 TABLET BY MOUTH EVERY DAY 90 tablet 2   atorvastatin  (LIPITOR) 40 MG tablet Take 1 tablet (40 mg total) by mouth daily. 30 tablet 11   bictegravir-emtricitabine-tenofovir AF (BIKTARVY ) 50-200-25 MG TABS tablet Take 1 tablet by mouth daily. 30 tablet 11  carvedilol  (COREG ) 3.125 MG tablet Take 1 tablet (3.125 mg total) by mouth 2 (two) times daily. 180 tablet 3   empagliflozin  (JARDIANCE ) 10 MG TABS tablet Take 1 tablet (10 mg total) by mouth daily. Please call [phone] to schedule an appointment for future refills. Thank you. 2nd attempt. 15 tablet 0   Finerenone  (KERENDIA ) 10 MG TABS TAKE 1 TABLET BY MOUTH EVERY DAY 90 tablet 3   losartan  (COZAAR ) 100 MG tablet TAKE 1 TABLET BY MOUTH EVERY DAY 90 tablet 2   metFORMIN  (GLUCOPHAGE ) 1000 MG tablet TAKE 1 TABLET (1,000 MG TOTAL) BY MOUTH TWICE A DAY WITH FOOD 180 tablet 1   spironolactone   (ALDACTONE ) 25 MG tablet Take 0.5 tablets (12.5 mg total) by mouth daily. 45 tablet 3   torsemide  (DEMADEX ) 20 MG tablet Take 1 tablet (20 mg total) by mouth daily. 180 tablet 1   No current facility-administered medications on file prior to visit.    No Known Allergies    Lab Results HIV 1 RNA Quant (Copies/mL)  Date Value  08/10/2023 Not Detected  11/09/2022 Not Detected  09/28/2022 Not Detected   CD4 T Cell Abs (/uL)  Date Value  08/10/2023 863  11/09/2022 807  09/28/2022 552   No results found for: "HIV1GENOSEQ" Lab Results  Component Value Date   WBC 5.5 08/10/2023   HGB 13.0 (L) 08/10/2023   HCT 40.1 08/10/2023   MCV 95.5 08/10/2023   PLT 189 08/10/2023    Lab Results  Component Value Date   CREATININE 2.54 (H) 01/25/2024   BUN 37 (H) 01/25/2024   NA 141 01/25/2024   K 4.5 01/25/2024   CL 108 (H) 01/25/2024   CO2 19 (L) 01/25/2024   Lab Results  Component Value Date   ALT 12 08/10/2023   AST 11 08/10/2023   ALKPHOS 91 10/19/2022   BILITOT 0.5 08/10/2023    Lab Results  Component Value Date   CHOL 155 01/25/2024   TRIG 103 01/25/2024   HDL 42 01/25/2024   LDLCALC 94 01/25/2024   Lab Results  Component Value Date   HAV Reactive (A) 08/25/2022   Lab Results  Component Value Date   HEPBSAG NON REACTIVE 08/25/2022   No results found for: "HCVAB" Lab Results  Component Value Date   CHLAMYDIAWP Negative 09/09/2022   N Negative 09/09/2022   No results found for: "GCPROBEAPT" No results found for: "QUANTGOLD"  Assessment/Plan HIV Viral load was undetectable at last visit. Patient is adherent to Biktarvy . Discussed the option of switching to injectable therapy, but patient prefers to continue with current regimen. -Continue Biktarvy  as prescribed. -Order HIV labs today. -Continue biktarvy , pt reports 100% adherence.  -She had question is regards to HIV. He declines counseling.  -HIV labs, F/U in 6 month   Chronic Kidney Disease Kidney  function has been consistently low. Sees D.r Yvonnie Heritage, nephorlogy   #Cardiac amyloid #HFpEF Patient continues to follow with cardiology and pulmonology. -Continue current management with specialists.    #Vaccination COVID bososter 05/25/23 [id number] Monkeypox PCV 20 04/13/22 Meningitis-needs HepA-immune  HEpB(sab nr, sag nr, ab Nr), needs hbv vaccine-1st dose 09/28/22, 2nd dose 02/09/23. Serology today Tdap unk Shingles needs   #Health maintenance -Quantiferon neagive 02/09/23 -RPR NR 08/26/22 -HCV NR 08/23/22 -Oral  GC NR on 09/09/22 -Colonoscopy-cologaurd this year  Orlie Bjornstad, MD [facility]   I have personally spent 42 minutes involved in face-to-face and non-face-to-face activities for this patient on the day  of the visit. Professional time spent includes the following activities: Preparing to see the patient (review of tests), Obtaining and/or reviewing separately obtained history (admission/discharge record), Performing a medically appropriate examination and/or evaluation , Ordering medications/tests/procedures, referring and communicating with other health care professionals, Documenting clinical information in the EMR, Independently interpreting results (not separately reported), Communicating results to the patient/family/caregiver, Counseling and educating the patient/family/caregiver and Care coordination (not separately reported).

## 2024-02-10 LAB — CBC WITH DIFFERENTIAL/PLATELET
Absolute Lymphocytes: 1465 {cells}/uL (ref 850–3900)
Absolute Monocytes: 390 {cells}/uL (ref 200–950)
Basophils Absolute: 50 {cells}/uL (ref 0–200)
Basophils Relative: 1 %
Eosinophils Absolute: 220 {cells}/uL (ref 15–500)
Eosinophils Relative: 4.4 %
HCT: 35.5 % — ABNORMAL LOW (ref 38.5–50.0)
Hemoglobin: 11.6 g/dL — ABNORMAL LOW (ref 13.2–17.1)
MCH: 31.4 pg (ref 27.0–33.0)
MCHC: 32.7 g/dL (ref 32.0–36.0)
MCV: 96.2 fL (ref 80.0–100.0)
MPV: 11.3 fL (ref 7.5–12.5)
Monocytes Relative: 7.8 %
Neutro Abs: 2875 {cells}/uL (ref 1500–7800)
Neutrophils Relative %: 57.5 %
Platelets: 209 10*3/uL (ref 140–400)
RBC: 3.69 10*6/uL — ABNORMAL LOW (ref 4.20–5.80)
RDW: 13.9 % (ref 11.0–15.0)
Total Lymphocyte: 29.3 %
WBC: 5 10*3/uL (ref 3.8–10.8)

## 2024-02-10 LAB — COMPLETE METABOLIC PANEL WITHOUT GFR
AG Ratio: 1.6 (calc) (ref 1.0–2.5)
ALT: 11 U/L (ref 9–46)
AST: 11 U/L (ref 10–35)
Albumin: 4.4 g/dL (ref 3.6–5.1)
Alkaline phosphatase (APISO): 84 U/L (ref 35–144)
BUN/Creatinine Ratio: 16 (calc) (ref 6–22)
BUN: 48 mg/dL — ABNORMAL HIGH (ref 7–25)
CO2: 29 mmol/L (ref 20–32)
Calcium: 9.8 mg/dL (ref 8.6–10.3)
Chloride: 106 mmol/L (ref 98–110)
Creat: 2.92 mg/dL — ABNORMAL HIGH (ref 0.70–1.35)
Globulin: 2.8 g/dL (ref 1.9–3.7)
Glucose, Bld: 88 mg/dL (ref 65–99)
Potassium: 4.6 mmol/L (ref 3.5–5.3)
Sodium: 143 mmol/L (ref 135–146)
Total Bilirubin: 0.4 mg/dL (ref 0.2–1.2)
Total Protein: 7.2 g/dL (ref 6.1–8.1)

## 2024-02-10 LAB — T-HELPER CELLS (CD4) COUNT (NOT AT ARMC)
Absolute CD4: 1114 {cells}/uL (ref 490–1740)
CD4 T Helper %: 71 % — ABNORMAL HIGH (ref 30–61)
Total lymphocyte count: 1579 {cells}/uL (ref 850–3900)

## 2024-02-10 LAB — HIV-1 RNA QUANT-NO REFLEX-BLD
HIV 1 RNA Quant: NOT DETECTED {copies}/mL
HIV-1 RNA Quant, Log: NOT DETECTED {Log_copies}/mL

## 2024-02-10 LAB — HEPATITIS B SURFACE ANTIBODY,QUALITATIVE: Hep B S Ab: REACTIVE — AB

## 2024-02-16 NOTE — Progress Notes (Signed)
 ADVANCED HEART FAILURE CLINIC NOTE  Referring Physician: Georgina Speaks, FNP  Primary Care: Georgina Speaks, FNP Primary Cardiologist: Wilbert Bihari  CC: chronic systolic heart failure, pulmonary hypertension  HPI: Mark [name] is a 63 y.o. male w/ HFpEF, severe OSA, recently diagnosed HIV on Biktarvy  (followed by Dr. Dennise) admitted most recently in December 2023 for acute hypoxic respiratory failure believed to be secondary to acute on chronic HFpEF exacerbation. During that admission he had a TTE w/ LVEF 50-55% and severe asymmetric septal hypertrophy but no dynamic LVOT gradient. He was diuresed with IV lasix , started on GDMT and discharged home. Since that time he has been seen in [facility] clinic at where jardiance /losartan  were continued and coreg  was increased to 12.5mg  BID. He has also followed with pulmonology in the interim. Since discharge in 12/23 he has been on chronic 2L home O2 with some improvement in exercise capacity. He had PFTs in 2019 w/o any obstructive disease.   Interval hx:  - He has been doing very well from a symptomatic standpoint. He spends time his time at the barbershop, fishing, grilling and reports no limitations from typical heart failure symptoms. No lightheadedness, chest pain; he continues to have some dyspnea with exertion that is likely secondary to his chronic hypoxic respiratory failure.    Current Outpatient Medications  Medication Sig Dispense Refill   allopurinol  (ZYLOPRIM ) 100 MG tablet TAKE 1/2 TALBLET (50 MG) BY MOUTH EVERY OTHER DAY FOR GOUT PREVENTION 42 tablet 2   amLODipine  (NORVASC ) 10 MG tablet TAKE 1 TABLET BY MOUTH EVERY DAY 90 tablet 2   atorvastatin  (LIPITOR) 40 MG tablet Take 1 tablet (40 mg total) by mouth daily. 30 tablet 11   bictegravir-emtricitabine-tenofovir AF (BIKTARVY ) 50-200-25 MG TABS tablet Take 1 tablet by mouth daily. 30 tablet 11   carvedilol  (COREG ) 3.125 MG tablet Take 1 tablet (3.125 mg total) by mouth 2 (two) times  daily. 180 tablet 3   empagliflozin  (JARDIANCE ) 10 MG TABS tablet Take 1 tablet (10 mg total) by mouth daily. Please call [phone] to schedule an appointment for future refills. Thank you. 2nd attempt. 15 tablet 0   Finerenone  (KERENDIA ) 10 MG TABS TAKE 1 TABLET BY MOUTH EVERY DAY 90 tablet 3   losartan  (COZAAR ) 100 MG tablet TAKE 1 TABLET BY MOUTH EVERY DAY 90 tablet 2   metFORMIN  (GLUCOPHAGE ) 1000 MG tablet TAKE 1 TABLET (1,000 MG TOTAL) BY MOUTH TWICE A DAY WITH FOOD 180 tablet 1   spironolactone  (ALDACTONE ) 25 MG tablet Take 0.5 tablets (12.5 mg total) by mouth daily. 45 tablet 3   torsemide  (DEMADEX ) 20 MG tablet Take 1 tablet (20 mg total) by mouth daily. 180 tablet 1   No current facility-administered medications for this encounter.      PHYSICAL EXAM: Vitals:   02/17/24 0948  BP: 110/70  Pulse: (!) 52  SpO2: 100%   GENERAL: NAD Lungs- normal work of breathing CARDIAC:  JVP: 7 cm          Normal rate with regular rhythm. no murmur.  Pulses 2+. no edema.  ABDOMEN: Soft, non-tender, non-distended.  EXTREMITIES: Warm and well perfused.  NEUROLOGIC: No obvious FND   DATA REVIEW  ECG: 08/25/22: NSR with wide LBBB, QRSd of  ECHO: LVEF 55%, severe asymmetric LVH.   PYP:  12/28/22: Grade II uptake  CMR: 03/30/23:  Study not suggestive of cardiac amyloidosis.  In chart review, study is more consistent with hypertensive heart disease; this is favored over low risk  variant hypertrophic non obstructive cardiomyopathy.  Evidence of LBBB.  LVEF 47%.  Evidence of RV dilation and pulmonary hypertension.     Latest Ref Rng & Units 05/11/2018   10:13 AM  PFT Results  FVC-Pre L 2.46   FVC-Predicted Pre % 58   FVC-Post L 2.25   FVC-Predicted Post % 53   Pre FEV1/FVC % % 79   Post FEV1/FCV % % 81   FEV1-Pre L 1.94   FEV1-Predicted Pre % 59   FEV1-Post L 1.84   DLCO uncorrected ml/min/mmHg 19.38   DLCO UNC% % 57   DLCO corrected ml/min/mmHg 19.67   DLCO COR  %Predicted % 58   DLVA Predicted % 105   TLC L 5.03   TLC % Predicted % 69   RV % Predicted % 99     HEMODYNAMICS: RA:                  8 mmHg (mean) RV:                  34/8 mmHg PA:                  62/25 mmHg (37 mean) PCWP:            18 mmHg (mean)                                      Estimated Fick CO/CI   4.5 L/min, 2 L/min/m2                                                 TPG                 19  mmHg                                            PVR                 4.2 Wood Units  PAPi                4.6       IMPRESSION: 1.Mildly elevated pre and post capillary filling pressures 2.Moderately elevated PVR and PA mean consistent with 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  3.Moderately reduced cardiac index.   ASSESSMENT & PLAN:  Heart failure with preserved EF Etiology of YQ:Dzczmz LVH on TTE; although, he has significant HTN, degree of LVH is concerning for infiltrative cardiomyopathy. CMR consistent with hypertensive heart disease. Myeloma panel previously negative; however, no FLC assay.  NYHA class / AHA Stage:IIB Volume status & Diuretics: Euvolemic on exam today Vasodilators:Losartan  100mg  daily; continue amlodipine  10mg ; BP at goal. Can start chlorthalidone or switch to combo pill in the future if requiried.  Beta-Blocker: continue coreg  3.125mg  BID MRA: continue fineronone 10mg  daily Cardiometabolic:Jardiance  10mg  Devices therapies & Valvulopathies:CMR consistent with hypertensive heart disease. Repeat TTE today with LVEF of 45-50% secondary to LBBB; however, symptomatically NYHA II. Will continue to monitor for CRT-D in the future.  Advanced therapies:Not indicated.   2. Pulmonary HTN - Likely group 3; on chronic O2; severe OHS/OSA. He will likely benefit mostly  from O2 therapy. Will refer to pulmonology  - V/Q scan pending - Serologic work up is negative: negative ANA - Does not have a heavy smoking history.   - CTA PE from 08/21/22 without  interstitial lung disease.  - He will see pulmonology on 08/03/23; he wishes to wean off O2, however, his O2 sats drop to the 80s when he is off oxygen .  -HRCT from 11/24 with no evidence of fibrotic lung disease.   3. Evaluation for Cardiac amyloid - PYP scan is grade 1 - Negative myeloma panel (no M spike etc) -At this time I do not think his PYP scan is positive.  LVH is likely secondary to uncontrolled hypertension.  CMR not consistent with amyloid.   4. HTN - continue amlodipine  10mg  daily.  - losartan  100mg  daily - BMP/BNP today  5. OSA - untreated, sleep study in 2015 showed severe OSA w/ AHI 41.5  - discussed association between untreated OSA and CV disease/ HTN - followed by Dr. Shlomo.  - He has now received his CPAP; reports compliance with it.    6. Obesity  - Body mass index is 35.93 kg/m. - wt loss advised    7. CKD IIIb  - b/l Scr previously  ~1.4 - continue fineronone 10mg  daily - repeat BMP/BNP today.    8. HLD - on atorvastatin  20  - followed by cardiology    9. HIV - now followed by ID, Dr. Dennise  - continue Biktarvy   - reports that he is compliant with therapy.   10. Chronic hypoxemic respiratory failure  - followed by pulmonology; last seen on 11/03/23, reviewed notes.  - currently on 2LPM; wishes to come off. Will plan on at follow up.   I spent 35 minutes caring for this patient today including face to face time, ordering and reviewing labs, reviewing echocardiogram with him today, seeing the patient, documenting in the record, and arranging follow ups.    Sheretha Shadd Advanced Heart Failure Mechanical Circulatory Support

## 2024-02-17 ENCOUNTER — Encounter (HOSPITAL_COMMUNITY): Payer: Self-pay | Admitting: Cardiology

## 2024-02-17 ENCOUNTER — Ambulatory Visit (HOSPITAL_COMMUNITY)
Admission: RE | Admit: 2024-02-17 | Discharge: 2024-02-17 | Disposition: A | Discharge status: Home / Self Care | Source: Ambulatory Visit | Attending: Cardiology | Admitting: Cardiology

## 2024-02-17 ENCOUNTER — Ambulatory Visit (HOSPITAL_COMMUNITY)
Admission: RE | Admit: 2024-02-17 | Discharge: 2024-02-17 | Disposition: A | Discharge status: Home / Self Care | Source: Ambulatory Visit | Attending: Nurse Practitioner | Admitting: Nurse Practitioner

## 2024-02-17 VITALS — BP 110/70 | HR 52 | Wt 236.4 lb

## 2024-02-17 DIAGNOSIS — E785 Hyperlipidemia, unspecified: Secondary | ICD-10-CM | POA: Insufficient documentation

## 2024-02-17 DIAGNOSIS — I130 Hypertensive heart and chronic kidney disease with heart failure and stage 1 through stage 4 chronic kidney disease, or unspecified chronic kidney disease: Secondary | ICD-10-CM | POA: Diagnosis not present

## 2024-02-17 DIAGNOSIS — Z79899 Other long term (current) drug therapy: Secondary | ICD-10-CM | POA: Insufficient documentation

## 2024-02-17 DIAGNOSIS — Z6835 Body mass index (BMI) 35.0-35.9, adult: Secondary | ICD-10-CM | POA: Diagnosis not present

## 2024-02-17 DIAGNOSIS — J9611 Chronic respiratory failure with hypoxia: Secondary | ICD-10-CM | POA: Diagnosis not present

## 2024-02-17 DIAGNOSIS — I5032 Chronic diastolic (congestive) heart failure: Secondary | ICD-10-CM | POA: Insufficient documentation

## 2024-02-17 DIAGNOSIS — Z9981 Dependence on supplemental oxygen: Secondary | ICD-10-CM | POA: Insufficient documentation

## 2024-02-17 DIAGNOSIS — G4733 Obstructive sleep apnea (adult) (pediatric): Secondary | ICD-10-CM | POA: Insufficient documentation

## 2024-02-17 DIAGNOSIS — I447 Left bundle-branch block, unspecified: Secondary | ICD-10-CM | POA: Diagnosis not present

## 2024-02-17 DIAGNOSIS — I272 Pulmonary hypertension, unspecified: Secondary | ICD-10-CM | POA: Diagnosis not present

## 2024-02-17 DIAGNOSIS — I10 Essential (primary) hypertension: Secondary | ICD-10-CM

## 2024-02-17 DIAGNOSIS — E669 Obesity, unspecified: Secondary | ICD-10-CM | POA: Diagnosis not present

## 2024-02-17 DIAGNOSIS — N1832 Chronic kidney disease, stage 3b: Secondary | ICD-10-CM | POA: Insufficient documentation

## 2024-02-17 DIAGNOSIS — Z21 Asymptomatic human immunodeficiency virus [HIV] infection status: Secondary | ICD-10-CM | POA: Diagnosis not present

## 2024-02-17 LAB — ECHOCARDIOGRAM COMPLETE
Area-P 1/2: 2.66 cm2
Calc EF: 42.9 %
S' Lateral: 3.4 cm
Single Plane A2C EF: 48.5 %
Single Plane A4C EF: 37.2 %

## 2024-02-17 NOTE — Patient Instructions (Signed)
 Medication Changes:  STOP SPIRONOLACTONE    CONTINUE FINERENONE    Follow-Up in: 3 MONTHS WITH DR. Bruce Caper AS SCHEDULED   At the Advanced [facility] Failure Clinic, you and your health needs are our priority. We have a designated team specialized in the treatment of Heart Failure. This Care Team includes your primary Heart Failure Specialized Cardiologist (physician), Advanced Practice Providers (APPs- Physician Assistants and Nurse Practitioners), and Pharmacist who all work together to provide you with the care you need, when you need it.   You may see any of the following providers on your designated Care Team at your next follow up:  Dr. Jules Oar Dr. Peder Bourdon Dr. Alwin Baars Dr. Judyth Nunnery Nieves Bars, NP Ruddy Corral, [place] [facility], [place] Dennise Fitz, NP [place] Lee, NP Luster Salters, PharmD   Please be sure to bring in all your medications bottles to every appointment.   Need to Contact Us :  If you have any questions or concerns before your next appointment please send us  a message through [place] or call our office at [phone].    TO LEAVE A MESSAGE FOR THE NURSE SELECT OPTION 2, PLEASE LEAVE A MESSAGE INCLUDING: YOUR NAME DATE OF BIRTH CALL BACK NUMBER REASON FOR CALL**this is important as we prioritize the call backs  YOU WILL RECEIVE A CALL BACK THE SAME DAY AS LONG AS YOU CALL BEFORE 4:00 PM

## 2024-02-17 NOTE — Progress Notes (Signed)
  Echocardiogram 2D Echocardiogram has been performed.  Dione Franks 02/17/2024, 9:38 AM

## 2024-03-09 ENCOUNTER — Other Ambulatory Visit: Payer: Self-pay

## 2024-03-09 NOTE — Patient Instructions (Signed)
 Visit Information  Thank you for taking time to visit with me today. Please don't hesitate to contact me if I can be of assistance to you before our next scheduled appointment.  Your next care management appointment is by telephone on Friday, August 8 at 12:30 PM  Please call the care guide team at [phone] if you need to cancel, schedule, or reschedule an appointment.   Please call [phone]-TALK (toll free, 24 hour hotline) if you are experiencing a Mental Health or Behavioral Health Crisis or need someone to talk to.  Clayborne Ly RN BSN CCM [place]  [facility], [facility] Health Nurse Care Coordinator  Direct Dial: [phone] Website: [email or web address]

## 2024-03-09 NOTE — Patient Outreach (Signed)
 Complex Care Management   Visit Note  03/09/2024  Name:  Mark [name] MRN: [id number] DOB: Nov 07, 1960  Situation: Referral received for Complex Care Management related to Chronic Kidney Disease, Pulmonary Hypertension, OSA, Primary Hypertension, Chronic Diastolic Heart Failure. I obtained verbal consent from Patient. Visit completed with patient on the phone.  Background:   Past Medical History:  Diagnosis Date   Abnormal lung function test    a. Reported to possibly be COPD, but per pulm note: Possible COPD - PFT was more suggestive of restrictive defect and diffusion defect likely from obesity and CHF   CHF (congestive heart failure) (HCC)    Chronic diastolic heart failure (HCC) 05/07/2018   CKD (chronic kidney disease), stage III (HCC)    Diabetes mellitus type 2 in obese    Elevated troponin 05/07/2018   Hearing impaired    Hyperlipidemia    Hypertension    LBBB (left bundle branch block)    LVH (left ventricular hypertrophy)    Mild aortic stenosis    Mild pulmonary hypertension (HCC)    Morbid obesity (HCC)    Pulmonary hypertension (HCC)    Sleep apnea     Assessment: Patient Reported Symptoms:  Cognitive Cognitive Status: Alert and oriented to person, place, and time Cognitive/Intellectual Conditions Management [RPT]: None reported or documented in medical history or problem list   Health Maintenance Behaviors: Annual physical exam, Healthy diet, Exercise, Sleep adequate Health Facilitated by: Healthy diet, Rest  Neurological Neurological Review of Symptoms: No symptoms reported    HEENT HEENT Symptoms Reported: Not assessed      Cardiovascular Cardiovascular Symptoms Reported: No symptoms reported Does patient have uncontrolled Hypertension?: No Cardiovascular Management Strategies: Medication therapy, Routine screening, Diet modification, Exercise Weight: 235 lb (106.6 kg) Cardiovascular Self-Management Outcome: 4 (good)  Respiratory Respiratory  Symptoms Reported: Shortness of breath Additional Respiratory Details: Instructed patient to contact Dr. Neda to schedule a follow up appointment Respiratory Management Strategies: Oxygen  therapy, Exercise, Routine screening Respiratory Self-Management Outcome: 4 (good)  Endocrine Endocrine Symptoms Reported: Not assessed    Gastrointestinal Gastrointestinal Symptoms Reported: Not assessed      Genitourinary Genitourinary Symptoms Reported: No symptoms reported Genitourinary Management Strategies: Fluid modification (Routine Screening) Genitourinary Self-Management Outcome: 4 (good)  Integumentary Integumentary Symptoms Reported: Not assessed    Musculoskeletal Musculoskelatal Symptoms Reviewed: No symptoms reported        Psychosocial Psychosocial Symptoms Reported: No symptoms reported     Quality of Family Relationships: supportive      01/27/2024   11:01 AM  Depression screen PHQ 2/9  Decreased Interest 0  Down, Depressed, Hopeless 0  PHQ - 2 Score 0    There were no vitals filed for this visit.  Medications Reviewed Today     Reviewed by Morgan Clayborne CROME, RN (Registered Nurse) on 03/09/24 at 1034  Med List Status: <None>   Medication Order Taking? Sig Documenting Provider Last Dose Status Informant  allopurinol  (ZYLOPRIM ) 100 MG tablet [id number]  TAKE 1/2 TALBLET (50 MG) BY MOUTH EVERY OTHER DAY FOR GOUT PREVENTION Petrina Pries, NP  Active   amLODipine  (NORVASC ) 10 MG tablet [id number]  TAKE 1 TABLET BY MOUTH EVERY DAY Hayes Beckey CROME, NP  Active   atorvastatin  (LIPITOR) 40 MG tablet [id number]  Take 1 tablet (40 mg total) by mouth daily. Sabharwal, Aditya, DO  Active   bictegravir-emtricitabine-tenofovir AF (BIKTARVY ) 50-200-25 MG TABS tablet [id number]  Take 1 tablet by mouth daily. Dennise Kingsley, MD  Active   carvedilol  (COREG ) 3.125  MG tablet [id number]  Take 1 tablet (3.125 mg total) by mouth 2 (two) times daily. Sabharwal, Aditya, DO  Active   empagliflozin   (JARDIANCE ) 10 MG TABS tablet [id number]  Take 1 tablet (10 mg total) by mouth daily. Please call [phone] to schedule an appointment for future refills. Thank you. 2nd attempt. Emelia Josefa HERO, NP  Active   Finerenone  (KERENDIA ) 10 MG TABS [id number]  TAKE 1 TABLET BY MOUTH EVERY DAY Sabharwal, Aditya, DO  Active   losartan  (COZAAR ) 100 MG tablet [id number]  TAKE 1 TABLET BY MOUTH EVERY DAY Sabharwal, Aditya, DO  Active   metFORMIN  (GLUCOPHAGE ) 1000 MG tablet [id number]  TAKE 1 TABLET (1,000 MG TOTAL) BY MOUTH TWICE A DAY WITH FOOD Georgina Speaks, FNP  Active   torsemide  (DEMADEX ) 20 MG tablet [id number]  Take 1 tablet (20 mg total) by mouth daily. [place] Led, DO  Active            Plan:  Follow up with Dr. Neda with [facility] re: evaluation of Pulmonary Hypertension and continuous Oxygen  usage  Telephone follow up appointment with care management team member scheduled for:  Friday, August 8 at 12:30 PM    Clayborne Ly RN BSN CCM [place]  [facility], [facility] Health Nurse Care Coordinator  Direct Dial: [phone] Website: [email or web address]

## 2024-04-02 ENCOUNTER — Other Ambulatory Visit: Payer: Self-pay | Admitting: General Practice

## 2024-04-03 DIAGNOSIS — N1832 Chronic kidney disease, stage 3b: Secondary | ICD-10-CM | POA: Diagnosis not present

## 2024-04-07 ENCOUNTER — Other Ambulatory Visit: Payer: Self-pay

## 2024-04-07 NOTE — Patient Instructions (Signed)
 Visit Information  Thank you for taking time to visit with me today. Please don't hesitate to contact me if I can be of assistance to you before our next scheduled appointment.  Your next care management appointment is by telephone on Thursday, September 11 at 12:15 PM  Please call the care guide team at [phone] if you need to cancel, schedule, or reschedule an appointment.   Please call [phone]-TALK (toll free, 24 hour hotline) if you are experiencing a Mental Health or Behavioral Health Crisis or need someone to talk to.  Clayborne Ly RN BSN CCM [place]  [facility], [facility] Health Nurse Care Coordinator  Direct Dial: [phone] Website: [email or web address]

## 2024-04-07 NOTE — Patient Outreach (Signed)
 Complex Care Management   Visit Note  04/07/2024  Name:  Mark [name] MRN: [id number] DOB: 03-20-1961  Situation: Referral received for Complex Care Management related to  Chronic Kidney Disease, Pulmonary Hypertension, OSA, Primary Hypertension, Chronic Diastolic Heart Failure. I obtained verbal consent from Patient.  Visit completed with patient on the phone.  Background:   Past Medical History:  Diagnosis Date   Abnormal lung function test    a. Reported to possibly be COPD, but per pulm note: Possible COPD - PFT was more suggestive of restrictive defect and diffusion defect likely from obesity and CHF   CHF (congestive heart failure) (HCC)    Chronic diastolic heart failure (HCC) 05/07/2018   CKD (chronic kidney disease), stage III (HCC)    Diabetes mellitus type 2 in obese    Elevated troponin 05/07/2018   Hearing impaired    Hyperlipidemia    Hypertension    LBBB (left bundle branch block)    LVH (left ventricular hypertrophy)    Mild aortic stenosis    Mild pulmonary hypertension (HCC)    Morbid obesity (HCC)    Pulmonary hypertension (HCC)    Sleep apnea     Assessment: Patient Reported Symptoms:  Cognitive Cognitive Status: Alert and oriented to person, place, and time, Normal speech and language skills Cognitive/Intellectual Conditions Management [RPT]: None reported or documented in medical history or problem list   Health Maintenance Behaviors: Annual physical exam, Healthy diet, Exercise Health Facilitated by: Healthy diet, Rest  Neurological Neurological Review of Symptoms: No symptoms reported    HEENT HEENT Symptoms Reported: No symptoms reported      Cardiovascular Cardiovascular Symptoms Reported: No symptoms reported Does patient have uncontrolled Hypertension?: No Cardiovascular Management Strategies: Medication therapy, Routine screening, Diet modification, Exercise Cardiovascular Self-Management Outcome: 4 (good) Cardiovascular Comment: hx of  Hypertensive heart disease with chronic diastolic congestive heart failure  Respiratory Respiratory Symptoms Reported: No symptoms reported Other Respiratory Symptoms: hx of Pulmonary Hypertension, OSA w/CPAP Respiratory Management Strategies: Oxygen  therapy, Routine screening, Medication therapy, CPAP Respiratory Self-Management Outcome: 4 (good)  Endocrine Endocrine Symptoms Reported: No symptoms reported Is patient diabetic?: Yes Is patient checking blood sugars at home?: No Endocrine Self-Management Outcome: 4 (good)  Gastrointestinal Gastrointestinal Symptoms Reported: No symptoms reported      Genitourinary Genitourinary Symptoms Reported: No symptoms reported Genitourinary Management Strategies: Fluid modification (Routine Screening) Genitourinary Self-Management Outcome: 3 (uncertain) Genitourinary Comment: stage IV CKD  Integumentary Integumentary Symptoms Reported: No symptoms reported    Musculoskeletal Musculoskelatal Symptoms Reviewed: No symptoms reported        Psychosocial Psychosocial Symptoms Reported: No symptoms reported   Major Change/Loss/Stressor/Fears (CP): Denies Quality of Family Relationships: supportive Do you feel physically threatened by others?: No      01/27/2024   11:01 AM  Depression screen PHQ 2/9  Decreased Interest 0  Down, Depressed, Hopeless 0  PHQ - 2 Score 0    There were no vitals filed for this visit.  Medications Reviewed Today     Reviewed by Morgan Clayborne CROME, RN (Registered Nurse) on 04/07/24 at 1310  Med List Status: <None>   Medication Order Taking? Sig Documenting Provider Last Dose Status Informant  allopurinol  (ZYLOPRIM ) 100 MG tablet [id number]  TAKE 1/2 TALBLET (50 MG) BY MOUTH EVERY OTHER DAY FOR GOUT PREVENTION Petrina Pries, NP  Active   amLODipine  (NORVASC ) 10 MG tablet [id number]  TAKE 1 TABLET BY MOUTH EVERY DAY Hayes Beckey CROME, NP  Active   atorvastatin  (LIPITOR) 40 MG tablet  [id number]  Take 1 tablet (40 mg total) by  mouth daily. Sabharwal, Aditya, DO  Active   bictegravir-emtricitabine-tenofovir AF (BIKTARVY ) 50-200-25 MG TABS tablet [id number]  Take 1 tablet by mouth daily. Dennise Kingsley, MD  Active   carvedilol  (COREG ) 3.125 MG tablet [id number]  Take 1 tablet (3.125 mg total) by mouth 2 (two) times daily. Sabharwal, Aditya, DO  Active   empagliflozin  (JARDIANCE ) 10 MG TABS tablet [id number]  Take 1 tablet (10 mg total) by mouth daily. Shlomo Wilbert SAUNDERS, MD  Active   Finerenone  (KERENDIA ) 10 MG TABS [id number]  TAKE 1 TABLET BY MOUTH EVERY DAY Sabharwal, Aditya, DO  Active   losartan  (COZAAR ) 100 MG tablet [id number]  TAKE 1 TABLET BY MOUTH EVERY DAY Sabharwal, Aditya, DO  Active   metFORMIN  (GLUCOPHAGE ) 1000 MG tablet [id number]  TAKE 1 TABLET (1,000 MG TOTAL) BY MOUTH TWICE A DAY WITH FOOD Georgina Speaks, FNP  Active   torsemide  (DEMADEX ) 20 MG tablet [id number]  Take 1 tablet (20 mg total) by mouth daily. Gardenia Led, DO  Active             Recommendation:   Specialty provider follow-up with Dr. Neda with [facility] scheduled for: 05/09/24 at 10:15 AM  Follow Up Plan:   Telephone follow up appointment date/time:  Thursday, September 11 at 12:15 PM  Clayborne Ly RN BSN CCM [place]  [facility], [facility] Health Nurse Care Coordinator  Direct Dial: [phone] Website: [email or web address]

## 2024-04-12 ENCOUNTER — Ambulatory Visit (INDEPENDENT_AMBULATORY_CARE_PROVIDER_SITE_OTHER)

## 2024-04-12 ENCOUNTER — Encounter (HOSPITAL_COMMUNITY): Payer: Self-pay | Admitting: Internal Medicine

## 2024-04-12 ENCOUNTER — Ambulatory Visit (HOSPITAL_COMMUNITY)
Admission: EM | Admit: 2024-04-12 | Discharge: 2024-04-12 | Disposition: A | Discharge status: Home / Self Care | Attending: Internal Medicine | Admitting: Internal Medicine

## 2024-04-12 DIAGNOSIS — M25571 Pain in right ankle and joints of right foot: Secondary | ICD-10-CM

## 2024-04-12 DIAGNOSIS — M25471 Effusion, right ankle: Secondary | ICD-10-CM | POA: Diagnosis not present

## 2024-04-12 MED ORDER — METHYLPREDNISOLONE ACETATE 80 MG/ML IJ SUSP: 80.0000 mg | Freq: Once | INTRAMUSCULAR | Status: DC

## 2024-04-12 MED ORDER — TRIAMCINOLONE ACETONIDE 40 MG/ML IJ SUSP
40.0000 mg | Freq: Once | INTRAMUSCULAR | Status: AC
  Administered 2024-04-12 (×2): 40 mg via INTRAMUSCULAR

## 2024-04-12 MED ORDER — TRIAMCINOLONE ACETONIDE 40 MG/ML IJ SUSP
INTRAMUSCULAR | Status: AC
  Filled 2024-04-12: qty 1

## 2024-04-12 MED ORDER — METHYLPREDNISOLONE 4 MG PO TBPK: ORAL_TABLET | ORAL | 0 refills | Status: DC

## 2024-04-12 NOTE — ED Provider Notes (Addendum)
 [facility]    CSN: [id number] Arrival date & time: 04/12/24  0803      History   Chief Complaint Chief Complaint  Patient presents with   Foot Pain    HPI Mark [name] is a 63 y.o. male who presents endorsing a 4-day history of right medial ankle pain and swelling.  He denies trauma or injury preceding the onset of pain.  He believes he is experiencing a gout flare as his current pain is similar in nature to pain he has experienced previously with gout flares.  He says he ate a bag of salty pretzels last week that he believes triggered the flare.  He endorses compliance with allopurinol .  He has been putting ice on his ankle over the last 3 days and believes some swelling is starting to improve. Denies fever/chills, increased fatigue, shortness of breath, numbness/tingling, and weakness in the right lower extremity.   Past Medical History:  Diagnosis Date   Abnormal lung function test    a. Reported to possibly be COPD, but per pulm note: Possible COPD - PFT was more suggestive of restrictive defect and diffusion defect likely from obesity and CHF   CHF (congestive heart failure) (HCC)    Chronic diastolic heart failure (HCC) 05/07/2018   CKD (chronic kidney disease), stage III (HCC)    Diabetes mellitus type 2 in obese    Elevated troponin 05/07/2018   Hearing impaired    Hyperlipidemia    Hypertension    LBBB (left bundle branch block)    LVH (left ventricular hypertrophy)    Mild aortic stenosis    Mild pulmonary hypertension (HCC)    Morbid obesity (HCC)    Pulmonary hypertension (HCC)    Sleep apnea     Patient Active Problem List   Diagnosis Date Noted   Class 1 obesity with body mass index (BMI) of 34.0 to 34.9 in adult 01/25/2024   Eye examination declined 01/25/2024   HIV disease (HCC) 09/27/2023   Herpes zoster vaccination declined 09/27/2023   COVID-19 vaccine administered 05/25/2023   Class 1 obesity due to excess calories without serious  comorbidity with body mass index (BMI) of 33.0 to 33.9 in adult 05/25/2023   Chronic gout without tophus 03/31/2023   Hypertensive heart disease with chronic diastolic congestive heart failure (HCC) 01/12/2023   Pulmonary hypertension (HCC) 01/12/2023   Asymptomatic HIV infection, with no history of HIV-related illness (HCC) 01/12/2023   HFrEF (heart failure with reduced ejection fraction) (HCC) 10/16/2022   Chronic respiratory failure with hypoxia (HCC) 10/16/2022   OSA (obstructive sleep apnea) 08/21/2022   Thyroid  nodule 01/15/2020   PAD (peripheral artery disease) (HCC) 05/28/2019   Type 2 diabetes mellitus with chronic kidney disease (HCC) 09/26/2018   Obesity (BMI 30-39.9) 09/26/2018   Chronic diastolic heart failure (HCC) 05/07/2018   Cardiomegaly    Encntr for general adult medical exam w/o abnormal findings 04/25/2018   Hearing loss 04/25/2018   LBBB (left bundle branch block) 04/25/2018   Hyperlipidemia 04/25/2018   Other long term (current) drug therapy 04/25/2018   Other specified disorders of pigmentation 04/25/2018   Personal history of noncompliance with medical treatment, presenting hazards to health 04/25/2018   Sensorineural hearing loss (SNHL) of left ear 06/14/2017   Hypertensive heart disease 06/05/2013   Pure hypercholesterolemia 06/05/2013   Vitamin D deficiency 06/05/2013   Corns and callosity 11/03/2010    Past Surgical History:  Procedure Laterality Date   BACK SURGERY  RIGHT HEART CATH N/A 11/06/2022   Procedure: RIGHT HEART CATH;  Surgeon: Gardenia Led, DO;  [facility];  Service: Cardiovascular;  Laterality: N/A;   TRACHEOSTOMY       Home Medications    Prior to Admission medications   Medication Sig Start Date End Date Taking? Authorizing Provider  methylPREDNISolone  (MEDROL  DOSEPAK) 4 MG TBPK tablet Use as directed. 04/12/24  Yes Melvenia Manus BRAVO, MD  allopurinol  (ZYLOPRIM ) 100 MG tablet TAKE 1/2 TALBLET (50 MG) BY MOUTH  EVERY OTHER DAY FOR GOUT PREVENTION 08/12/23   Petrina Pries, NP  amLODipine  (NORVASC ) 10 MG tablet TAKE 1 TABLET BY MOUTH EVERY DAY 11/19/23   Hayes Beckey CROME, NP  atorvastatin  (LIPITOR) 40 MG tablet Take 1 tablet (40 mg total) by mouth daily. 07/16/23   Sabharwal, Aditya, DO  bictegravir-emtricitabine-tenofovir AF (BIKTARVY ) 50-200-25 MG TABS tablet Take 1 tablet by mouth daily. 02/08/24   Dennise Kingsley, MD  carvedilol  (COREG ) 3.125 MG tablet Take 1 tablet (3.125 mg total) by mouth 2 (two) times daily. 08/13/23   Sabharwal, Aditya, DO  empagliflozin  (JARDIANCE ) 10 MG TABS tablet Take 1 tablet (10 mg total) by mouth daily. 04/05/24   Shlomo Wilbert SAUNDERS, MD  Finerenone  (KERENDIA ) 10 MG TABS TAKE 1 TABLET BY MOUTH EVERY DAY 01/14/24   Sabharwal, Aditya, DO  losartan  (COZAAR ) 100 MG tablet TAKE 1 TABLET BY MOUTH EVERY DAY 11/10/23   Sabharwal, Aditya, DO  metFORMIN  (GLUCOPHAGE ) 1000 MG tablet TAKE 1 TABLET (1,000 MG TOTAL) BY MOUTH TWICE A DAY WITH FOOD 02/03/24   Georgina Speaks, FNP  torsemide  (DEMADEX ) 20 MG tablet Take 1 tablet (20 mg total) by mouth daily. 11/22/23   Gardenia Led, DO    Family History Family History  Problem Relation Age of Onset   Hypertension Mother    Hypertension Father     Social History Social History   Tobacco Use   Smoking status: Former    Current packs/day: 0.00    Types: Cigarettes    Start date: 1998    Quit date: 1999    Years since quitting: 26.6   Smokeless tobacco: Never   Tobacco comments:    1 pack would last one month--08/01/18  Vaping Use   Vaping status: Never Used  Substance Use Topics   Alcohol use: No   Drug use: No     Allergies   Patient has no known allergies.   Review of Systems Review of Systems  Constitutional:  Negative for chills and fever.  HENT:  Negative for ear pain and sore throat.   Eyes:  Negative for pain and visual disturbance.  Respiratory:  Negative for cough and shortness of breath.   Cardiovascular:  Negative for  chest pain and palpitations.  Gastrointestinal:  Negative for abdominal pain and vomiting.  Genitourinary:  Negative for dysuria and hematuria.  Musculoskeletal:  Positive for arthralgias (right medial ankle pain). Negative for back pain.  Skin:  Negative for color change and rash.  Neurological:  Negative for seizures and syncope.  All other systems reviewed and are negative.    Physical Exam Triage Vital Signs ED Triage Vitals  Encounter Vitals Group     BP 04/12/24 0816 112/75     Girls Systolic BP Percentile --      Girls Diastolic BP Percentile --      Boys Systolic BP Percentile --      Boys Diastolic BP Percentile --      Pulse Rate 04/12/24 0816 85  Resp 04/12/24 0816 16     Temp 04/12/24 0816 98.2 F (36.8 C)     Temp Source 04/12/24 0816 Oral     SpO2 04/12/24 0816 97 %     Weight --      Height --      Head Circumference --      Peak Flow --      Pain Score 04/12/24 0818 9     Pain Loc --      Pain Education --      Exclude from Growth Chart --    No data found.  Updated Vital Signs BP 112/75 (BP Location: Left Arm)   Pulse 85   Temp 98.2 F (36.8 C) (Oral)   Resp 16   SpO2 97%   Physical Exam Vitals reviewed.  Constitutional:      General: He is not in acute distress.    Appearance: He is obese. He is not toxic-appearing.  Musculoskeletal:        General: Swelling and tenderness present.     Comments: Moderate swelling present along the medial aspect of the right ankle.  There is mild tenderness palpation over the medial malleolus.  No significant erythema noted range of motion of the ankle is generally intact.  He endorses mild pain with dorsiflexion, inversion, and eversion.  The right foot is neurovascularly intact.  He ambulates with an antalgic gait favoring the left leg  Skin:    General: Skin is warm and dry.  Neurological:     Mental Status: He is alert.    UC Treatments / Results  Labs (all labs ordered are listed, but only abnormal  results are displayed) Labs Reviewed - No data to display  EKG   Radiology DG Ankle Complete Right Result Date: 04/12/2024 CLINICAL DATA:  RIGHT medial ankle pain EXAM: RIGHT ANKLE - COMPLETE 3+ VIEW COMPARISON:  None Available. FINDINGS: Ankle mortise intact. The talar dome is normal. No malleolar fracture. The calcaneus is normal. Enthesopathic spurring along the plantar aspect of the calcaneus. IMPRESSION: No fracture or dislocation.  No aggressive arthropathy identified. Electronically Signed   By: Jackquline Boxer M.D.   On: 04/12/2024 09:11    Procedures Procedures (including critical care time)  Medications Ordered in UC Medications  triamcinolone  acetonide (KENALOG -40) injection 40 mg (has no administration in time range)    Initial Impression / Assessment and Plan / UC Course  I have reviewed the triage vital signs and the nursing notes.  Pertinent labs & imaging results that were available during my care of the patient were reviewed by me and considered in my medical decision making (see chart for details).    Patient is a 63 year old male with an extensive past medical history presenting for right medial ankle pain x 4 days.  He is unaware of any trauma preceding the onset of pain and believes he is experiencing a gout flare.  He has a well-documented history of recurrent gout flares of multiple joints.  On exam today there is a moderate degree of swelling over the medial portion of the right ankle.  No significant erythema present there is tenderness palpation of the medial malleolus as well as along the right midfoot.  History and exam findings are certainly suggestive of potential gout flare but must also consider additional etiologies such as traumatic injury, arthritic flare, cellulitis, foreign body, etc. Will start with updating x-rays of the right ankle.  He has previously responded well to systemic steroids for management  of gout flares.  His most recent documented GFR  was 27 in late May.  X-rays of the right ankle show no evidence of fracture or acute finding.  This was reviewed with the patient.  Given his history and current symptoms, will empirically treat for gout flare.  He has responded well to systemic steroids in the past.  NSAIDs contraindicated given advanced CKD.  With this in mind, Kenalog  40 mg IM injection was administered today.  A Medrol  Dosepak is also been prescribed with instructions to start tomorrow (8/14).  He was instructed to return to care if symptoms worsen or fail to improve.  Patient stable for discharge at this time.  Final Clinical Impressions(s) / UC Diagnoses   Final diagnoses:  Pain and swelling of right ankle     Discharge Instructions      Your ankle pain is likely due to a gout flare. As we discussed, you have been given a steroid injection today and I sent a steroid taper to your pharmacy. Please return to care if pain worsens or does not improve.      ED Prescriptions     Medication Sig Dispense Auth. Provider   methylPREDNISolone  (MEDROL  DOSEPAK) 4 MG TBPK tablet Use as directed. 21 each Melvenia Manus BRAVO, MD      PDMP not reviewed this encounter.   Melvenia Manus BRAVO, MD 04/12/24 [id number]    Melvenia Manus BRAVO, MD 04/12/24 [phone]

## 2024-04-12 NOTE — Discharge Instructions (Addendum)
 Your ankle pain is likely due to a gout flare. As we discussed, you have been given a steroid injection today and I sent a steroid taper to your pharmacy. Please return to care if pain worsens or does not improve.

## 2024-04-12 NOTE — ED Triage Notes (Signed)
 Pt states pain, redness and swelling to right ankle for the past  4 days.  States he has a history of gout.  States he has been putting ice on it at home with some improvement in his swelling.  Pt ambulates with a limp.

## 2024-04-19 ENCOUNTER — Other Ambulatory Visit: Payer: Self-pay

## 2024-04-19 ENCOUNTER — Ambulatory Visit

## 2024-05-04 ENCOUNTER — Telehealth (HOSPITAL_COMMUNITY): Payer: Self-pay | Admitting: Cardiology

## 2024-05-04 ENCOUNTER — Other Ambulatory Visit (HOSPITAL_COMMUNITY): Payer: Self-pay | Admitting: Cardiology

## 2024-05-05 ENCOUNTER — Other Ambulatory Visit (HOSPITAL_COMMUNITY): Payer: Self-pay | Admitting: Pharmacist

## 2024-05-05 ENCOUNTER — Other Ambulatory Visit (HOSPITAL_COMMUNITY): Payer: Self-pay | Admitting: Cardiology

## 2024-05-05 MED ORDER — ATORVASTATIN CALCIUM 40 MG PO TABS: 40.0000 mg | ORAL_TABLET | Freq: Every day | ORAL | 3 refills | Status: DC

## 2024-05-05 MED ORDER — ATORVASTATIN CALCIUM 40 MG PO TABS: 40.0000 mg | ORAL_TABLET | Freq: Every day | ORAL | 3 refills | Status: AC

## 2024-05-06 ENCOUNTER — Other Ambulatory Visit: Payer: Self-pay | Admitting: Family Medicine

## 2024-05-06 DIAGNOSIS — M1A40X0 Other secondary chronic gout, unspecified site, without tophus (tophi): Secondary | ICD-10-CM

## 2024-05-08 DIAGNOSIS — E1122 Type 2 diabetes mellitus with diabetic chronic kidney disease: Secondary | ICD-10-CM | POA: Diagnosis not present

## 2024-05-08 DIAGNOSIS — I5032 Chronic diastolic (congestive) heart failure: Secondary | ICD-10-CM | POA: Diagnosis not present

## 2024-05-08 DIAGNOSIS — N184 Chronic kidney disease, stage 4 (severe): Secondary | ICD-10-CM | POA: Diagnosis not present

## 2024-05-08 DIAGNOSIS — I130 Hypertensive heart and chronic kidney disease with heart failure and stage 1 through stage 4 chronic kidney disease, or unspecified chronic kidney disease: Secondary | ICD-10-CM | POA: Diagnosis not present

## 2024-05-08 DIAGNOSIS — N2581 Secondary hyperparathyroidism of renal origin: Secondary | ICD-10-CM | POA: Diagnosis not present

## 2024-05-08 DIAGNOSIS — R809 Proteinuria, unspecified: Secondary | ICD-10-CM | POA: Diagnosis not present

## 2024-05-09 ENCOUNTER — Ambulatory Visit: Admitting: Pulmonary Disease

## 2024-05-09 VITALS — BP 132/75 | HR 59 | Ht 71.0 in | Wt 236.0 lb

## 2024-05-09 DIAGNOSIS — J449 Chronic obstructive pulmonary disease, unspecified: Secondary | ICD-10-CM

## 2024-05-09 DIAGNOSIS — G4733 Obstructive sleep apnea (adult) (pediatric): Secondary | ICD-10-CM

## 2024-05-09 NOTE — Patient Instructions (Signed)
 Continue using your oxygen  on a regular basis  Continue using your CPAP on a nightly basis - Download from the machine shows it is working well  I will see you back in about 6 months  Make sure you continue to stay active  Call us  with significant concerns

## 2024-05-09 NOTE — Progress Notes (Signed)
 Mark [name]    [id number]    19-Oct-1960  Primary Care Physician:Moore, Gaines, FNP  Referring Physician: Georgina Gaines, FNP [address] [place],  [place] [address]  Chief complaint:   Patient with a history of severe obstructive sleep apnea, history of heart failure, chronic hypoxemic respiratory failure in for evaluation In for follow-up today  HPI:  Was diagnosed with severe obstructive sleep apnea in 2015 Has been using his CPAP on a nightly basis Using oxygen  during the day  Wakes up feeling rested  Denies any significant issues with continuing to use his CPAP  Uses oxygen  around-the-clock during the day, sometimes takes it off with some activities and does notice desaturations  Activity level is maintained Exercises regularly Does go to the gym regularly  He does have a history of heart failure, chronic hypoxic respiratory failure for which he is on oxygen , history of HIV-compliant with treatment He is a reformed smoker  Reformed smoker  Denies any significant dryness of his mouth in the mornings Wakes up feeling like he is at a good nights rest Gets about 7 to 8 hours on his CPAP  Outpatient Encounter Medications as of 05/09/2024  Medication Sig   allopurinol  (ZYLOPRIM ) 100 MG tablet TAKE 1/2 TALBLET (50 MG) BY MOUTH EVERY OTHER DAY FOR GOUT PREVENTION   amLODipine  (NORVASC ) 10 MG tablet TAKE 1 TABLET BY MOUTH EVERY DAY   atorvastatin  (LIPITOR) 40 MG tablet Take 1 tablet (40 mg total) by mouth daily.   bictegravir-emtricitabine-tenofovir AF (BIKTARVY ) 50-200-25 MG TABS tablet Take 1 tablet by mouth daily.   carvedilol  (COREG ) 3.125 MG tablet Take 1 tablet (3.125 mg total) by mouth 2 (two) times daily.   empagliflozin  (JARDIANCE ) 10 MG TABS tablet Take 1 tablet (10 mg total) by mouth daily.   Finerenone  (KERENDIA ) 10 MG TABS TAKE 1 TABLET BY MOUTH EVERY DAY   losartan  (COZAAR ) 100 MG tablet TAKE 1 TABLET BY MOUTH EVERY DAY   metFORMIN   (GLUCOPHAGE ) 1000 MG tablet TAKE 1 TABLET (1,000 MG TOTAL) BY MOUTH TWICE A DAY WITH FOOD   methylPREDNISolone  (MEDROL  DOSEPAK) 4 MG TBPK tablet Use as directed.   torsemide  (DEMADEX ) 20 MG tablet Take 1 tablet (20 mg total) by mouth daily.   No facility-administered encounter medications on file as of 05/09/2024.    Allergies as of 05/09/2024   (No Known Allergies)    Past Medical History:  Diagnosis Date   Abnormal lung function test    a. Reported to possibly be COPD, but per pulm note: Possible COPD - PFT was more suggestive of restrictive defect and diffusion defect likely from obesity and CHF   CHF (congestive heart failure) (HCC)    Chronic diastolic heart failure (HCC) 05/07/2018   CKD (chronic kidney disease), stage III (HCC)    Diabetes mellitus type 2 in obese    Elevated troponin 05/07/2018   Hearing impaired    Hyperlipidemia    Hypertension    LBBB (left bundle branch block)    LVH (left ventricular hypertrophy)    Mild aortic stenosis    Mild pulmonary hypertension (HCC)    Morbid obesity (HCC)    Pulmonary hypertension (HCC)    Sleep apnea     Past Surgical History:  Procedure Laterality Date   BACK SURGERY     RIGHT HEART CATH N/A 11/06/2022   Procedure: RIGHT HEART CATH;  Surgeon: Gardenia Led, DO;  [facility];  Service: Cardiovascular;  Laterality: N/A;   TRACHEOSTOMY      Family History  Problem Relation Age of Onset   Hypertension Mother    Hypertension Father     Social History   Socioeconomic History   Marital status: Divorced    Spouse name: Not on file   Number of children: 2   Years of education: Not on file   Highest education level: Not on file  Occupational History   Occupation: self-employed    Comment: recieves disability  Tobacco Use   Smoking status: Former    Current packs/day: 0.00    Types: Cigarettes    Start date: 1998    Quit date: 1999    Years since quitting: 26.7   Smokeless tobacco: Never    Tobacco comments:    1 pack would last one month--08/01/18  Vaping Use   Vaping status: Never Used  Substance and Sexual Activity   Alcohol use: No   Drug use: No   Sexual activity: Not Currently    Partners: Female    Comment: declied condom  Other Topics Concern   Not on file  Social History Narrative   Not on file   Social Drivers of Health   Financial Resource Strain: Low Risk  (01/27/2024)   Overall Financial Resource Strain (CARDIA)    Difficulty of Paying Living Expenses: Not hard at all  Food Insecurity: No Food Insecurity (04/07/2024)   Hunger Vital Sign    Worried About Running Out of Food in the Last Year: Never true    Ran Out of Food in the Last Year: Never true  Transportation Needs: No Transportation Needs (04/07/2024)   PRAPARE - Administrator, Civil Service (Medical): No    Lack of Transportation (Non-Medical): No  Physical Activity: Sufficiently Active (01/27/2024)   Exercise Vital Sign    Days of Exercise per Week: 7 days    Minutes of Exercise per Session: 120 min  Stress: No Stress Concern Present (01/27/2024)   [employer] of Occupational Health - Occupational Stress Questionnaire    Feeling of Stress : Not at all  Social Connections: Moderately Isolated (01/27/2024)   Social Connection and Isolation Panel    Frequency of Communication with Friends and Family: More than three times a week    Frequency of Social Gatherings with Friends and Family: More than three times a week    Attends Religious Services: Never    Database administrator or Organizations: Yes    Attends Banker Meetings: 1 to 4 times per year    Marital Status: Divorced  Intimate Partner Violence: Not At Risk (04/07/2024)   Humiliation, Afraid, Rape, and Kick questionnaire    Fear of Current or Ex-Partner: No    Emotionally Abused: No    Physically Abused: No    Sexually Abused: No    Review of Systems  Constitutional:  Negative for fatigue.   Respiratory:  Positive for apnea. Negative for shortness of breath.   Psychiatric/Behavioral:  Positive for sleep disturbance.     There were no vitals filed for this visit.    Physical Exam Constitutional:      Appearance: He is obese.  HENT:     Head: Normocephalic.     Mouth/Throat:     Mouth: Mucous membranes are moist.     Comments: Crowded oropharynx, Mallampati 3 Eyes:     Pupils: Pupils are equal, round, and reactive to light.  Cardiovascular:  Rate and Rhythm: Normal rate and regular rhythm.     Heart sounds: No murmur heard.    No friction rub.  Pulmonary:     Effort: No respiratory distress.     Breath sounds: No stridor. No wheezing or rhonchi.  Musculoskeletal:     Cervical back: Normal range of motion. No rigidity or tenderness.  Neurological:     Mental Status: He is alert.  Psychiatric:        Mood and Affect: Mood normal.       10/15/2022    9:00 AM  Results of the Epworth flowsheet  Sitting and reading 0  Watching TV 1  Sitting, inactive in a public place (e.g. a theatre or a meeting) 0  As a passenger in a car for an hour without a break 1  Lying down to rest in the afternoon when circumstances permit 1  Sitting and talking to someone 0  Sitting quietly after a lunch without alcohol 0  In a car, while stopped for a few minutes in traffic 0  Total score 3    Data Reviewed: Sleep study from 2015 does reveal severe obstructive sleep apnea Recent echocardiogram with borderline low ejection fraction, grade 1 diastolic dysfunction  Excellent compliance at 97% Average use of 10 hours 38 minutes AutoSet 10-16 with EPR of 395 percentile pressure 14.8 Residual AHI 1.5  Echocardiogram 02/17/2024 - Dilated atria, left ventricular ejection fraction of 50 to 55%, moderate concentric left ventricular hypertrophy with moderate to severe hypertrophy of the basal septal segments  Assessment:   History of obstructive sleep apnea - Continues to use  CPAP on a nightly basis Benefiting from CPAP therapy  Pulmonary hypertension - Likely multifactorial  History of diastolic heart failure - Appears euvolemic  Chronic hypoxemic respiratory failure - Compliant with oxygen  use    Plan/Recommendations: Continue CPAP on a nightly basis - Encouraged to give us  a call with any significant concerns  Continue oxygen  during the day  Encouraged to monitor his pulse oximetry on a regular basis to ascertain when he needs oxygen  I can take a break once in a while  Graded activities as tolerated - Encouraged to make sure he keeps up with his activity levels, goes to the gym on a regular basis  Encouraged to call with significant concerns  Follow-up in about 6 months      Jennet Epley MD [facility] 05/09/2024, 10:03 AM  CC: Georgina Speaks, FNP

## 2024-05-11 ENCOUNTER — Other Ambulatory Visit: Payer: Self-pay

## 2024-05-11 NOTE — Patient Instructions (Signed)
 Visit Information  Thank you for taking time to visit with me today.  Your next PCP appointment with Gaines Ada FNP is in-person at @PCP @'s office on 05/31/24 at 10:20 AM  Please call the care guide team at [phone] if you need to cancel, schedule, or reschedule an appointment.   Please call [phone]-TALK (toll free, 24 hour hotline) if you are experiencing a Mental Health or Behavioral Health Crisis or need someone to talk to.  Clayborne Ly RN BSN CCM [place]  [facility], [facility] Nurse Care Coordinator  Direct Dial: [phone] Website: [email or web address]

## 2024-05-11 NOTE — Patient Outreach (Signed)
 Complex Care Management   Visit Note  05/11/2024  Name:  Mark [name] MRN: [id number] DOB: [birth year]  Situation: Referral received for Complex Care Management related to Chronic Kidney Disease, Pulmonary Hypertension, OSA, Primary Hypertension, Chronic Diastolic Heart Failure. I obtained verbal consent from Patient.  Visit completed with Patient on the phone.  Background:   Past Medical History:  Diagnosis Date   Abnormal lung function test    a. Reported to possibly be COPD, but per pulm note: Possible COPD - PFT was more suggestive of restrictive defect and diffusion defect likely from obesity and CHF   CHF (congestive heart failure) (HCC)    Chronic diastolic heart failure (HCC) 05/07/2018   CKD (chronic kidney disease), stage III (HCC)    Diabetes mellitus type 2 in obese    Elevated troponin 05/07/2018   Hearing impaired    Hyperlipidemia    Hypertension    LBBB (left bundle branch block)    LVH (left ventricular hypertrophy)    Mild aortic stenosis    Mild pulmonary hypertension (HCC)    Morbid obesity (HCC)    Pulmonary hypertension (HCC)    Sleep apnea     Assessment: Patient Reported Symptoms:  Cognitive Cognitive Status: Alert and oriented to person, place, and time, Normal speech and language skills Cognitive/Intellectual Conditions Management [RPT]: None reported or documented in medical history or problem list   Health Maintenance Behaviors: Annual physical exam, Exercise, Healthy diet, Immunizations, Social activities, Sleep adequate Health Facilitated by: Healthy diet, Rest  Neurological Neurological Review of Symptoms: No symptoms reported    HEENT HEENT Symptoms Reported: No symptoms reported      Cardiovascular Cardiovascular Symptoms Reported: No symptoms reported Does patient have uncontrolled Hypertension?: No Cardiovascular Management Strategies: Medication therapy, Adequate rest, Exercise, Routine screening Cardiovascular Self-Management  Outcome: 4 (good)  Respiratory Respiratory Symptoms Reported: No symptoms reported Respiratory Management Strategies: CPAP, Oxygen  therapy, Routine screening, Breathing exercise, Exercise, Adequate rest Respiratory Self-Management Outcome: 4 (good)  Endocrine Endocrine Symptoms Reported: No symptoms reported Is patient diabetic?: Yes Is patient checking blood sugars at home?: No Endocrine Self-Management Outcome: 5 (very good)  Gastrointestinal Gastrointestinal Symptoms Reported: No symptoms reported      Genitourinary Genitourinary Symptoms Reported: No symptoms reported    Integumentary Integumentary Symptoms Reported: No symptoms reported    Musculoskeletal Musculoskelatal Symptoms Reviewed: No symptoms reported        Psychosocial Psychosocial Symptoms Reported: No symptoms reported   Major Change/Loss/Stressor/Fears (CP): Denies Quality of Family Relationships: helpful, involved, supportive Do you feel physically threatened by others?: No    05/11/2024    PHQ2-9 Depression Screening   Carl Bleecker interest or pleasure in doing things    Feeling down, depressed, or hopeless    PHQ-2 - Total Score    Trouble falling or staying asleep, or sleeping too much    Feeling tired or having Macedonio Scallon energy    Poor appetite or overeating     Feeling bad about yourself - or that you are a failure or have let yourself or your family down    Trouble concentrating on things, such as reading the newspaper or watching television    Moving or speaking so slowly that other people could have noticed.  Or the opposite - being so fidgety or restless that you have been moving around a lot more than usual    Thoughts that you would be better off dead, or hurting yourself in some way    PHQ2-9 Total Score  If you checked off any problems, how difficult have these problems made it for you to do your work, take care of things at home, or get along with other people    Depression Interventions/Treatment       There were no vitals filed for this visit.  Medications Reviewed Today     Reviewed by Morgan Clayborne CROME, RN (Registered Nurse) on 05/11/24 at 1248  Med List Status: <None>   Medication Order Taking? Sig Documenting Provider Last Dose Status Informant  allopurinol  (ZYLOPRIM ) 100 MG tablet [id number]  TAKE 1/2 TALBLET (50 MG) BY MOUTH EVERY OTHER DAY FOR GOUT PREVENTION Petrina Pries, NP  Active   amLODipine  (NORVASC ) 10 MG tablet [id number]  TAKE 1 TABLET BY MOUTH EVERY DAY Hayes Beckey CROME, NP  Active   atorvastatin  (LIPITOR) 40 MG tablet [id number]  Take 1 tablet (40 mg total) by mouth daily. Sabharwal, Aditya, DO  Active   bictegravir-emtricitabine-tenofovir AF (BIKTARVY ) 50-200-25 MG TABS tablet [id number]  Take 1 tablet by mouth daily. Dennise Kingsley, MD  Active   carvedilol  (COREG ) 3.125 MG tablet [id number]  Take 1 tablet (3.125 mg total) by mouth 2 (two) times daily. Sabharwal, Aditya, DO  Active   empagliflozin  (JARDIANCE ) 10 MG TABS tablet [id number]  Take 1 tablet (10 mg total) by mouth daily. Shlomo Wilbert SAUNDERS, MD  Active   Finerenone  (KERENDIA ) 10 MG TABS [id number]  TAKE 1 TABLET BY MOUTH EVERY DAY Sabharwal, Aditya, DO  Active   losartan  (COZAAR ) 100 MG tablet [id number]  TAKE 1 TABLET BY MOUTH EVERY DAY Sabharwal, Aditya, DO  Active   metFORMIN  (GLUCOPHAGE ) 1000 MG tablet [id number]  TAKE 1 TABLET (1,000 MG TOTAL) BY MOUTH TWICE A DAY WITH FOOD Georgina Speaks, FNP  Active   methylPREDNISolone  (MEDROL  DOSEPAK) 4 MG TBPK tablet [id number]  Use as directed. Melvenia Manus BRAVO, MD  Active   torsemide  (DEMADEX ) 20 MG tablet [id number]  Take 1 tablet (20 mg total) by mouth daily. Sabharwal, Aditya, DO  Active             Recommendation:   Continue Current Plan of Care  Follow Up Plan:   Patient has met all care management goals. Care Management case will be closed. Patient has been provided contact information should new needs arise.   Clayborne Morgan RN BSN CCM [place]  [facility], [facility] Nurse Care Coordinator  Direct Dial: [phone] Website: [email or web address]

## 2024-05-29 ENCOUNTER — Encounter (HOSPITAL_COMMUNITY): Admitting: Cardiology

## 2024-05-30 NOTE — Progress Notes (Unsigned)
 LILLETTE Kristeen JINNY Gladis, CMA,acting as a Neurosurgeon for Gaines Ada, FNP.,have documented all relevant documentation on the behalf of Gaines Ada, FNP,as directed by  Gaines Ada, FNP while in the presence of Gaines Ada, FNP.  Subjective:  Patient ID: Mark [name] , male    DOB: [birth year] , 63 y.o.   MRN: [id number]  No chief complaint on file.   HPI  HPI   Past Medical History:  Diagnosis Date   Abnormal lung function test    a. Reported to possibly be COPD, but per pulm note: Possible COPD - PFT was more suggestive of restrictive defect and diffusion defect likely from obesity and CHF   CHF (congestive heart failure) (HCC)    Chronic diastolic heart failure (HCC) 05/07/2018   CKD (chronic kidney disease), stage III (HCC)    Diabetes mellitus type 2 in obese    Elevated troponin 05/07/2018   Hearing impaired    Hyperlipidemia    Hypertension    LBBB (left bundle branch block)    LVH (left ventricular hypertrophy)    Mild aortic stenosis    Mild pulmonary hypertension (HCC)    Morbid obesity (HCC)    Pulmonary hypertension (HCC)    Sleep apnea      Family History  Problem Relation Age of Onset   Hypertension Mother    Hypertension Father      Current Outpatient Medications:    allopurinol  (ZYLOPRIM ) 100 MG tablet, TAKE 1/2 TALBLET (50 MG) BY MOUTH EVERY OTHER DAY FOR GOUT PREVENTION, Disp: 42 tablet, Rfl: 2   amLODipine  (NORVASC ) 10 MG tablet, TAKE 1 TABLET BY MOUTH EVERY DAY, Disp: 90 tablet, Rfl: 2   atorvastatin  (LIPITOR) 40 MG tablet, Take 1 tablet (40 mg total) by mouth daily., Disp: 90 tablet, Rfl: 3   bictegravir-emtricitabine-tenofovir AF (BIKTARVY ) 50-200-25 MG TABS tablet, Take 1 tablet by mouth daily., Disp: 30 tablet, Rfl: 11   carvedilol  (COREG ) 3.125 MG tablet, Take 1 tablet (3.125 mg total) by mouth 2 (two) times daily., Disp: 180 tablet, Rfl: 3   empagliflozin  (JARDIANCE ) 10 MG TABS tablet, Take 1 tablet (10 mg total) by mouth daily., Disp: 90 tablet, Rfl:  3   Finerenone  (KERENDIA ) 10 MG TABS, TAKE 1 TABLET BY MOUTH EVERY DAY, Disp: 90 tablet, Rfl: 3   losartan  (COZAAR ) 100 MG tablet, TAKE 1 TABLET BY MOUTH EVERY DAY, Disp: 90 tablet, Rfl: 2   metFORMIN  (GLUCOPHAGE ) 1000 MG tablet, TAKE 1 TABLET (1,000 MG TOTAL) BY MOUTH TWICE A DAY WITH FOOD, Disp: 180 tablet, Rfl: 1   methylPREDNISolone  (MEDROL  DOSEPAK) 4 MG TBPK tablet, Use as directed., Disp: 21 each, Rfl: 0   torsemide  (DEMADEX ) 20 MG tablet, Take 1 tablet (20 mg total) by mouth daily., Disp: 180 tablet, Rfl: 1   No Known Allergies   Review of Systems   There were no vitals filed for this visit. There is no height or weight on file to calculate BMI.  Wt Readings from Last 3 Encounters:  05/09/24 236 lb (107 kg)  03/09/24 235 lb (106.6 kg)  02/17/24 236 lb 6.4 oz (107.2 kg)    The 10-year ASCVD risk score (Arnett DK, et al., 2019) is: 29.3%   Values used to calculate the score:     Age: 44 years     Clincally relevant sex: Male     Is Non-Hispanic African American: Yes     Diabetic: Yes     Tobacco smoker: No     Systolic Blood Pressure:  132 mmHg     Is BP treated: Yes     HDL Cholesterol: 42 mg/dL     Total Cholesterol: 155 mg/dL  Objective:  Physical Exam      Assessment And Plan:  Type 2 diabetes mellitus with other circulatory complication, without long-term current use of insulin  (HCC)  Mixed hyperlipidemia  Hypertensive heart disease without heart failure    No follow-ups on file.  Patient was given opportunity to ask questions. Patient verbalized understanding of the plan and was able to repeat key elements of the plan. All questions were answered to their satisfaction.    LILLETTE Gaines Ada, FNP, have reviewed all documentation for this visit. The documentation on 05/30/24 for the exam, diagnosis, procedures, and orders are all accurate and complete.   IF YOU HAVE BEEN REFERRED TO A SPECIALIST, IT MAY TAKE 1-2 WEEKS TO SCHEDULE/PROCESS THE REFERRAL. IF YOU HAVE  NOT HEARD FROM US /SPECIALIST IN TWO WEEKS, PLEASE GIVE US  A CALL AT [phone].

## 2024-05-31 ENCOUNTER — Ambulatory Visit: Admitting: Nurse Practitioner

## 2024-05-31 ENCOUNTER — Encounter: Payer: Self-pay | Admitting: Nurse Practitioner

## 2024-05-31 VITALS — BP 130/80 | HR 61 | Temp 98.5°F | Ht 71.0 in | Wt 237.4 lb

## 2024-05-31 DIAGNOSIS — Z6833 Body mass index (BMI) 33.0-33.9, adult: Secondary | ICD-10-CM

## 2024-05-31 DIAGNOSIS — E66811 Obesity, class 1: Secondary | ICD-10-CM

## 2024-05-31 DIAGNOSIS — E782 Mixed hyperlipidemia: Secondary | ICD-10-CM | POA: Diagnosis not present

## 2024-05-31 DIAGNOSIS — E6609 Other obesity due to excess calories: Secondary | ICD-10-CM | POA: Diagnosis not present

## 2024-05-31 DIAGNOSIS — I119 Hypertensive heart disease without heart failure: Secondary | ICD-10-CM

## 2024-05-31 DIAGNOSIS — E1122 Type 2 diabetes mellitus with diabetic chronic kidney disease: Secondary | ICD-10-CM

## 2024-05-31 DIAGNOSIS — E662 Morbid (severe) obesity with alveolar hypoventilation: Secondary | ICD-10-CM | POA: Insufficient documentation

## 2024-05-31 DIAGNOSIS — I130 Hypertensive heart and chronic kidney disease with heart failure and stage 1 through stage 4 chronic kidney disease, or unspecified chronic kidney disease: Secondary | ICD-10-CM | POA: Diagnosis not present

## 2024-05-31 DIAGNOSIS — I110 Hypertensive heart disease with heart failure: Secondary | ICD-10-CM

## 2024-05-31 DIAGNOSIS — N184 Chronic kidney disease, stage 4 (severe): Secondary | ICD-10-CM | POA: Diagnosis not present

## 2024-05-31 DIAGNOSIS — I5032 Chronic diastolic (congestive) heart failure: Secondary | ICD-10-CM | POA: Diagnosis not present

## 2024-05-31 DIAGNOSIS — E1159 Type 2 diabetes mellitus with other circulatory complications: Secondary | ICD-10-CM

## 2024-05-31 NOTE — Assessment & Plan Note (Signed)
 Blood pressure is well controlled, continue current medications.  Continue f/u with Cardiology.

## 2024-05-31 NOTE — Assessment & Plan Note (Signed)
 hgbA1c is stable. Continue current medications. Continue f/u with Dr. Jerrye. She has recommended to stop his metformin  due to worsening kidney functions. He was also discontinued off his spironolactone 

## 2024-05-31 NOTE — Assessment & Plan Note (Signed)
 He is encouraged to strive for BMI less than 30 to decrease cardiac risk. Continue to exercise regularly aiming for at least 150 minutes of exercise per week.

## 2024-05-31 NOTE — Assessment & Plan Note (Signed)
 Cholesterol levels are stable, continue statin. Tolerating well.

## 2024-06-01 LAB — HEMOGLOBIN A1C
Est. average glucose Bld gHb Est-mCnc: 123 mg/dL
Hgb A1c MFr Bld: 5.9 % — ABNORMAL HIGH (ref 4.8–5.6)

## 2024-06-01 LAB — LIPID PANEL
Chol/HDL Ratio: 3.7 ratio (ref 0.0–5.0)
Cholesterol, Total: 179 mg/dL (ref 100–199)
HDL: 49 mg/dL (ref >39)
LDL Chol Calc (NIH): 109 mg/dL — ABNORMAL HIGH (ref 0–99)
Triglycerides: 118 mg/dL (ref 0–149)
VLDL Cholesterol Cal: 21 mg/dL (ref 5–40)

## 2024-06-16 ENCOUNTER — Ambulatory Visit (INDEPENDENT_AMBULATORY_CARE_PROVIDER_SITE_OTHER): Payer: Self-pay

## 2024-06-16 DIAGNOSIS — Z0000 Encounter for general adult medical examination without abnormal findings: Secondary | ICD-10-CM

## 2024-06-16 NOTE — Progress Notes (Signed)
 Subjective:   Mark [name] is a 63 y.o. male who presents for Medicare Annual/Subsequent preventive examination.  Visit Complete: Virtual I connected with  Mark [name] on 06/16/24 by a audio enabled telemedicine application and verified that I am speaking with the correct person using two identifiers.  Patient Location: Home  Provider Location: Office/Clinic  I discussed the limitations of evaluation and management by telemedicine. The patient expressed understanding and agreed to proceed.  Vital Signs: Because this visit was a virtual/telehealth visit, some criteria may be missing or patient reported. Any vitals not documented were not able to be obtained and vitals that have been documented are patient reported.  Patient Medicare AWV questionnaire was completed by the patient on 06/16/2024; I have confirmed that all information answered by patient is correct and no changes since this date.        Objective:    There were no vitals filed for this visit. There is no height or weight on file to calculate BMI.     05/06/2023   10:06 AM 12/31/2022   11:20 PM 11/06/2022    7:29 AM 09/12/2022    2:41 AM 08/21/2022    9:34 AM 04/08/2022   10:28 AM 03/12/2021   10:56 AM  Advanced Directives  Does Patient Have a Medical Advance Directive? No No No No No No No  Would patient like information on creating a medical advance directive?  No - Patient declined No - Patient declined  No - Patient declined No - Patient declined     Current Medications (verified) Outpatient Encounter Medications as of 06/16/2024  Medication Sig   allopurinol  (ZYLOPRIM ) 100 MG tablet TAKE 1/2 TALBLET (50 MG) BY MOUTH EVERY OTHER DAY FOR GOUT PREVENTION   amLODipine  (NORVASC ) 10 MG tablet TAKE 1 TABLET BY MOUTH EVERY DAY   atorvastatin  (LIPITOR) 40 MG tablet Take 1 tablet (40 mg total) by mouth daily.   bictegravir-emtricitabine-tenofovir AF (BIKTARVY ) 50-200-25 MG TABS tablet Take 1 tablet by mouth  daily.   carvedilol  (COREG ) 3.125 MG tablet Take 1 tablet (3.125 mg total) by mouth 2 (two) times daily.   empagliflozin  (JARDIANCE ) 10 MG TABS tablet Take 1 tablet (10 mg total) by mouth daily.   Finerenone  (KERENDIA ) 10 MG TABS TAKE 1 TABLET BY MOUTH EVERY DAY   losartan  (COZAAR ) 100 MG tablet TAKE 1 TABLET BY MOUTH EVERY DAY   methylPREDNISolone  (MEDROL  DOSEPAK) 4 MG TBPK tablet Use as directed.   torsemide  (DEMADEX ) 20 MG tablet Take 1 tablet (20 mg total) by mouth daily.   No facility-administered encounter medications on file as of 06/16/2024.    Allergies (verified) Patient has no known allergies.   History: Past Medical History:  Diagnosis Date   Abnormal lung function test    a. Reported to possibly be COPD, but per pulm note: Possible COPD - PFT was more suggestive of restrictive defect and diffusion defect likely from obesity and CHF   CHF (congestive heart failure) (HCC)    Chronic diastolic heart failure (HCC) 05/07/2018   CKD (chronic kidney disease), stage III (HCC)    Diabetes mellitus type 2 in obese    Elevated troponin 05/07/2018   Hearing impaired    Hyperlipidemia    Hypertension    LBBB (left bundle branch block)    LVH (left ventricular hypertrophy)    Mild aortic stenosis    Mild pulmonary hypertension (HCC)    Morbid obesity (HCC)    Pulmonary hypertension (HCC)    Sleep  apnea    Past Surgical History:  Procedure Laterality Date   BACK SURGERY     RIGHT HEART CATH N/A 11/06/2022   Procedure: RIGHT HEART CATH;  Surgeon: Gardenia Led, DO;  Location: [facility];  Service: Cardiovascular;  Laterality: N/A;   TRACHEOSTOMY     Family History  Problem Relation Age of Onset   Hypertension Mother    Hypertension Father    Social History   Socioeconomic History   Marital status: Divorced    Spouse name: Not on file   Number of children: 2   Years of education: Not on file   Highest education level: Not on file  Occupational History    Occupation: self-employed    Comment: recieves disability  Tobacco Use   Smoking status: Former    Current packs/day: 0.00    Types: Cigarettes    Start date: 1998    Quit date: 1999    Years since quitting: 26.8   Smokeless tobacco: Never   Tobacco comments:    1 pack would last one month--08/01/18  Vaping Use   Vaping status: Never Used  Substance and Sexual Activity   Alcohol use: No   Drug use: No   Sexual activity: Not Currently    Partners: Female    Comment: declied condom  Other Topics Concern   Not on file  Social History Narrative   Not on file   Social Drivers of Health   Financial Resource Strain: Low Risk  (01/27/2024)   Overall Financial Resource Strain (CARDIA)    Difficulty of Paying Living Expenses: Not hard at all  Food Insecurity: No Food Insecurity (04/07/2024)   Hunger Vital Sign    Worried About Running Out of Food in the Last Year: Never true    Ran Out of Food in the Last Year: Never true  Transportation Needs: No Transportation Needs (04/07/2024)   PRAPARE - Administrator, Civil Service (Medical): No    Lack of Transportation (Non-Medical): No  Physical Activity: Sufficiently Active (01/27/2024)   Exercise Vital Sign    Days of Exercise per Week: 7 days    Minutes of Exercise per Session: 120 min  Stress: No Stress Concern Present (01/27/2024)   [employer] of Occupational Health - Occupational Stress Questionnaire    Feeling of Stress : Not at all  Social Connections: Moderately Isolated (01/27/2024)   Social Connection and Isolation Panel    Frequency of Communication with Friends and Family: More than three times a week    Frequency of Social Gatherings with Friends and Family: More than three times a week    Attends Religious Services: Never    Database administrator or Organizations: Yes    Attends Banker Meetings: 1 to 4 times per year    Marital Status: Divorced    Tobacco Counseling Counseling given: Not  Answered Tobacco comments: 1 pack would last one month--08/01/18   Clinical Intake:                        Activities of Daily Living     No data to display          Patient Care Team: Georgina Speaks, FNP as PCP - General (General Practice) O'Neal, Darryle Ned, MD as PCP - Cardiology (Cardiology) Little, Clayborne CROME, RN as VBCI Care Management  Indicate any recent Medical Services you may have received from other than [facility] providers in the  past year (date may be approximate).     Assessment:   This is a routine wellness examination for Meziah.  Hearing/Vision screen No results found.   Goals Addressed   None    Depression Screen    02/08/2024    8:53 AM 01/27/2024   11:01 AM 01/27/2024   10:48 AM 01/25/2024   10:15 AM 09/27/2023   10:28 AM 08/10/2023    8:40 AM 05/06/2023   10:09 AM  PHQ 2/9 Scores  PHQ - 2 Score  0 0 0 0 0 0  PHQ- 9 Score    0 0  0  Exception Documentation Patient refusal          Fall Risk    02/08/2024    8:53 AM 01/27/2024   11:00 AM 01/25/2024   10:15 AM 08/10/2023    8:40 AM 05/06/2023   10:08 AM  Fall Risk   Falls in the past year? 0 0 0 0 0  Number falls in past yr:  0 0 0 0  Injury with Fall?  0 0 0 0  Risk for fall due to :  No Fall Risks No Fall Risks  Medication side effect  Follow up  Falls evaluation completed Falls evaluation completed  Falls prevention discussed;Falls evaluation completed    MEDICARE RISK AT HOME:    TIMED UP AND GO:  Was the test performed?  No    Cognitive Function:        05/06/2023   10:10 AM 04/08/2022   10:32 AM 03/12/2021   10:57 AM 03/07/2020   11:20 AM 12/27/2018    2:36 PM  6CIT Screen  What Year? 0 points 0 points 0 points 0 points 0 points  What month? 0 points 0 points 0 points 0 points 0 points  What time? 0 points 0 points 0 points 0 points 0 points  Count back from 20 0 points 0 points 2 points 0 points 0 points  Months in reverse 2 points 4 points 4 points 4 points 4 points   Repeat phrase 4 points 4 points 0 points 0 points 2 points  Total Score 6 points 8 points 6 points 4 points 6 points    Immunizations Immunization History  Administered Date(s) Administered   Hepb-cpg 09/28/2022, 02/09/2023   Influenza, Seasonal, Injecte, Preservative Fre 05/06/2023   Influenza,inj,Quad PF,6+ Mos 06/18/2021, 04/13/2022   Influenza-Unspecified 04/13/2022   PFIZER(Purple Top)SARS-COV-2 Vaccination 02/01/2020, 02/22/2020, 08/20/2020   PNEUMOCOCCAL CONJUGATE-20 08/24/2022   Pfizer Covid-19 Vaccine Bivalent Booster 83yrs & up 06/18/2021   Pfizer(Comirnaty)Fall Seasonal Vaccine 12 years and older 05/25/2023, 09/27/2023    TDAP status: Up to date  Flu Vaccine status: Due, Education has been provided regarding the importance of this vaccine. Advised may receive this vaccine at local pharmacy or Health Dept. Aware to provide a copy of the vaccination record if obtained from local pharmacy or Health Dept. Verbalized acceptance and understanding.  Pneumococcal vaccine status: Up to date  Covid-19 vaccine status: Information provided on how to obtain vaccines.   Qualifies for Shingles Vaccine? Yes   Zostavax completed No   Shingrix Completed?: No.    Education has been provided regarding the importance of this vaccine. Patient has been advised to call insurance company to determine out of pocket expense if they have not yet received this vaccine. Advised may also receive vaccine at local pharmacy or Health Dept. Verbalized acceptance and understanding.  Screening Tests Health Maintenance  Topic Date Due   OPHTHALMOLOGY EXAM  Never done   Zoster Vaccines- Shingrix (1 of 2) Never done   Colonoscopy  Never done   Influenza Vaccine  03/31/2024   COVID-19 Vaccine (7 - 2025-26 season) 05/01/2024   Medicare Annual Wellness (AWV)  05/05/2024   Diabetic kidney evaluation - Urine ACR  05/25/2024   DTaP/Tdap/Td (1 - Tdap) 09/26/2024 (Originally 10/09/1979)   FOOT EXAM  09/26/2024    HEMOGLOBIN A1C  11/29/2024   Diabetic kidney evaluation - eGFR measurement  02/07/2025   Pneumococcal Vaccine: 50+ Years  Completed   Hepatitis B Vaccines 19-59 Average Risk  Completed   Hepatitis C Screening  Completed   HIV Screening  Completed   Meningococcal B Vaccine  Aged Out    Health Maintenance  Health Maintenance Due  Topic Date Due   OPHTHALMOLOGY EXAM  Never done   Zoster Vaccines- Shingrix (1 of 2) Never done   Colonoscopy  Never done   Influenza Vaccine  03/31/2024   COVID-19 Vaccine (7 - 2025-26 season) 05/01/2024   Medicare Annual Wellness (AWV)  05/05/2024   Diabetic kidney evaluation - Urine ACR  05/25/2024    Lung Cancer Screening: (Low Dose CT Chest recommended if Age 43-80 years, 20 pack-year currently smoking OR have quit w/in 15years.) does not qualify.   Lung Cancer Screening Referral:   Additional Screening:  Hepatitis C Screening: does qualify; Completed 06/10/2023  Vision Screening: Recommended annual ophthalmology exams for early detection of glaucoma and other disorders of the eye. Is the patient up to date with their annual eye exam?  No  Who is the provider or what is the name of the office in which the patient attends annual eye exams? declined If pt is not established with a provider, would they like to be referred to a provider to establish care? declined.   Dental Screening: Recommended annual dental exams for proper oral hygiene  Diabetic Foot Exam: Diabetic Foot Exam: Completed 09/27/2023  Community Resource Referral / Chronic Care Management: CRR required this visit?  No   CCM required this visit?  No     Plan:     I have personally reviewed and noted the following in the patient's chart:   Medical and social history Use of alcohol, tobacco or illicit drugs  Current medications and supplements including opioid prescriptions. Patient is not currently taking opioid prescriptions. Functional ability and status Nutritional  status Physical activity Advanced directives List of other physicians Hospitalizations, surgeries, and ER visits in previous 12 months Vitals Screenings to include cognitive, depression, and falls Referrals and appointments  In addition, I have reviewed and discussed with patient certain preventive protocols, quality metrics, and best practice recommendations. A written personalized care plan for preventive services as well as general preventive health recommendations were provided to patient.     Kristeen JINNY Lunger, CMA   06/16/2024   After Visit Summary: (Declined) Due to this being a telephonic visit, with patients personalized plan was offered to patient but patient Declined AVS at this time   Nurse Notes:

## 2024-06-23 ENCOUNTER — Telehealth (HOSPITAL_COMMUNITY): Payer: Self-pay | Admitting: Cardiology

## 2024-06-23 NOTE — Telephone Encounter (Signed)
 Called to confirm/remind patient of [facility] appointment [facility] on 06/23/24.   Appointment:   [x] Confirmed  [] Left mess   [] No answer/No voice mail  [] VM Full/unable to leave message  [] Phone not in service  Patient reminded to bring all medications and/or complete list.  Confirmed patient has transportation. Gave directions, instructed to utilize valet parking.

## 2024-06-26 ENCOUNTER — Ambulatory Visit (HOSPITAL_COMMUNITY): Payer: Self-pay | Admitting: Adult Health

## 2024-06-26 ENCOUNTER — Encounter (HOSPITAL_COMMUNITY): Payer: Self-pay | Admitting: Cardiology

## 2024-06-26 ENCOUNTER — Ambulatory Visit (HOSPITAL_COMMUNITY)
Admission: RE | Admit: 2024-06-26 | Discharge: 2024-06-26 | Disposition: A | Discharge status: Home / Self Care | Source: Ambulatory Visit | Attending: Adult Health | Admitting: Adult Health

## 2024-06-26 VITALS — BP 132/80 | HR 70 | Ht 71.0 in | Wt 237.4 lb

## 2024-06-26 DIAGNOSIS — I272 Pulmonary hypertension, unspecified: Secondary | ICD-10-CM | POA: Insufficient documentation

## 2024-06-26 DIAGNOSIS — Z6833 Body mass index (BMI) 33.0-33.9, adult: Secondary | ICD-10-CM | POA: Insufficient documentation

## 2024-06-26 DIAGNOSIS — I5032 Chronic diastolic (congestive) heart failure: Secondary | ICD-10-CM | POA: Insufficient documentation

## 2024-06-26 DIAGNOSIS — J9611 Chronic respiratory failure with hypoxia: Secondary | ICD-10-CM | POA: Diagnosis not present

## 2024-06-26 DIAGNOSIS — Z7984 Long term (current) use of oral hypoglycemic drugs: Secondary | ICD-10-CM | POA: Insufficient documentation

## 2024-06-26 DIAGNOSIS — N529 Male erectile dysfunction, unspecified: Secondary | ICD-10-CM | POA: Insufficient documentation

## 2024-06-26 DIAGNOSIS — Z9981 Dependence on supplemental oxygen: Secondary | ICD-10-CM | POA: Insufficient documentation

## 2024-06-26 DIAGNOSIS — I130 Hypertensive heart and chronic kidney disease with heart failure and stage 1 through stage 4 chronic kidney disease, or unspecified chronic kidney disease: Secondary | ICD-10-CM | POA: Diagnosis not present

## 2024-06-26 DIAGNOSIS — N1832 Chronic kidney disease, stage 3b: Secondary | ICD-10-CM | POA: Diagnosis not present

## 2024-06-26 DIAGNOSIS — G4733 Obstructive sleep apnea (adult) (pediatric): Secondary | ICD-10-CM | POA: Diagnosis not present

## 2024-06-26 DIAGNOSIS — E669 Obesity, unspecified: Secondary | ICD-10-CM | POA: Insufficient documentation

## 2024-06-26 DIAGNOSIS — I5022 Chronic systolic (congestive) heart failure: Secondary | ICD-10-CM | POA: Diagnosis present

## 2024-06-26 DIAGNOSIS — I10 Essential (primary) hypertension: Secondary | ICD-10-CM

## 2024-06-26 DIAGNOSIS — Z21 Asymptomatic human immunodeficiency virus [HIV] infection status: Secondary | ICD-10-CM | POA: Insufficient documentation

## 2024-06-26 DIAGNOSIS — E785 Hyperlipidemia, unspecified: Secondary | ICD-10-CM | POA: Insufficient documentation

## 2024-06-26 DIAGNOSIS — Z79899 Other long term (current) drug therapy: Secondary | ICD-10-CM | POA: Insufficient documentation

## 2024-06-26 LAB — BASIC METABOLIC PANEL WITH GFR
Anion gap: 9 (ref 5–15)
BUN: 30 mg/dL — ABNORMAL HIGH (ref 8–23)
CO2: 26 mmol/L (ref 22–32)
Calcium: 9.5 mg/dL (ref 8.9–10.3)
Chloride: 108 mmol/L (ref 98–111)
Creatinine, Ser: 2.64 mg/dL — ABNORMAL HIGH (ref 0.61–1.24)
GFR, Estimated: 26 mL/min — ABNORMAL LOW (ref >60)
Glucose, Bld: 99 mg/dL (ref 70–99)
Potassium: 4.3 mmol/L (ref 3.5–5.1)
Sodium: 143 mmol/L (ref 135–145)

## 2024-06-26 MED ORDER — SILDENAFIL CITRATE 50 MG PO TABS: 50.0000 mg | ORAL_TABLET | Freq: Every day | ORAL | 2 refills | Status: AC | PRN

## 2024-06-26 NOTE — Patient Instructions (Signed)
 Medication Changes:  Take Sildenafil 50 mg AS NEEDED  Lab Work:  Labs done today, your results will be available in MyChart, we will contact you for abnormal readings.  Special Instructions // Education:  Do the following things EVERYDAY: Weigh yourself in the morning before breakfast. Write it down and keep it in a log. Take your medicines as prescribed Eat low salt foods--Limit salt (sodium) to 2000 mg per day.  Stay as active as you can everyday Limit all fluids for the day to less than 2 liters   Follow-Up in: 3-4 months with Dr Zenaida (Jan/Feb 2026), **PLEASE CALL OUR OFFICE IN DECEMBER TO SCHEDULE THIS APPOINTMENT   At the [facility], you and your health needs are our priority. We have a designated team specialized in the treatment of Heart Failure. This Care Team includes your primary Heart Failure Specialized Cardiologist (physician), Advanced Practice Providers (APPs- Physician Assistants and Nurse Practitioners), and Pharmacist who all work together to provide you with the care you need, when you need it.   You may see any of the following providers on your designated Care Team at your next follow up:  Dr. Toribio Fuel Dr. Ezra Shuck Dr. Ria Commander Dr. Odis Zenaida Greig Mosses, NP Caffie Shed, [place] [facility] [place], [place] Beckey Coe, NP Jordan Lee, NP Tinnie Redman, PharmD   Please be sure to bring in all your medications bottles to every appointment.   Need to Contact Us :  If you have any questions or concerns before your next appointment please send us  a message through [place] or call our office at [phone].    TO LEAVE A MESSAGE FOR THE NURSE SELECT OPTION 2, PLEASE LEAVE A MESSAGE INCLUDING: YOUR NAME DATE OF BIRTH CALL BACK NUMBER REASON FOR CALL**this is important as we prioritize the call backs  YOU WILL RECEIVE A CALL BACK THE SAME DAY AS LONG AS YOU CALL BEFORE 4:00 PM

## 2024-06-26 NOTE — Progress Notes (Signed)
 [facility] NOTE  Referring Physician: Georgina Speaks, FNP  Primary Care: Georgina Speaks, FNP Primary Cardiologist: Wilbert Bihari  CC: chronic systolic heart failure, pulmonary hypertension, ED  HPI: Mark [name] is a 63 y.o. male w/ HFpEF, severe OSA, recently diagnosed HIV on Biktarvy  (followed by Dr. Dennise) admitted most recently in December 2023 for acute hypoxic respiratory failure believed to be secondary to acute on chronic HFpEF exacerbation. During that admission he had a TTE w/ LVEF 50-55% and severe asymmetric septal hypertrophy but no dynamic LVOT gradient. He was diuresed with IV lasix , started on GDMT and discharged home. Since that time he has been seen in [facility] clinic at where jardiance /losartan  were continued and coreg  was increased to 12.5mg  BID. He has also followed with pulmonology in the interim. Since discharge in 12/23 he has been on chronic 2L home O2 with some improvement in exercise capacity. He had PFTs in 2019 w/o any obstructive disease.   Interval hx:  - Today he returns for HF follow up.Overall feeling fine. Complaining of erectile dysfunction. Denies SOB/PND/Orthopnea. Continues to use 2 liters [place]. Appetite ok. No fever or chills. Weight at home  pounds. Taking all medications.     Current Outpatient Medications  Medication Sig Dispense Refill   allopurinol  (ZYLOPRIM ) 100 MG tablet TAKE 1/2 TALBLET (50 MG) BY MOUTH EVERY OTHER DAY FOR GOUT PREVENTION 42 tablet 2   amLODipine  (NORVASC ) 10 MG tablet TAKE 1 TABLET BY MOUTH EVERY DAY 90 tablet 2   atorvastatin  (LIPITOR) 40 MG tablet Take 1 tablet (40 mg total) by mouth daily. 90 tablet 3   bictegravir-emtricitabine-tenofovir AF (BIKTARVY ) 50-200-25 MG TABS tablet Take 1 tablet by mouth daily. 30 tablet 11   carvedilol  (COREG ) 3.125 MG tablet Take 1 tablet (3.125 mg total) by mouth 2 (two) times daily. 180 tablet 3   empagliflozin  (JARDIANCE ) 10 MG TABS tablet Take 1 tablet (10 mg total) by  mouth daily. 90 tablet 3   Finerenone  (KERENDIA ) 10 MG TABS TAKE 1 TABLET BY MOUTH EVERY DAY 90 tablet 3   losartan  (COZAAR ) 100 MG tablet TAKE 1 TABLET BY MOUTH EVERY DAY 90 tablet 2   torsemide  (DEMADEX ) 20 MG tablet Take 1 tablet (20 mg total) by mouth daily. 180 tablet 1   No current facility-administered medications for this encounter.      PHYSICAL EXAM: Vitals:   06/26/24 0831  BP: 132/80  Pulse: 70  SpO2: 100%   Wt Readings from Last 3 Encounters:  06/26/24 107.7 kg (237 lb 6.4 oz)  05/31/24 107.7 kg (237 lb 6.4 oz)  05/09/24 107 kg (236 lb)   General:   No resp difficulty Neck: no JVD.  Cor: Regular rate & rhythm.  Lungs: clear Abdomen: soft, nontender, nondistended.  Extremities: no  edema Neuro: alert & oriented x3  DATA REVIEW  ECG: 08/25/22: NSR with wide LBBB, QRSd of  ECHO: 01/2024 LVEF 55%, severe asymmetric LVH.   PYP:  12/28/22: Grade II uptake  CMR: 03/30/23:  Study not suggestive of cardiac amyloidosis.  In chart review, study is more consistent with hypertensive heart disease; this is favored over low risk variant hypertrophic non obstructive cardiomyopathy.  Evidence of LBBB.  LVEF 47%.  Evidence of RV dilation and pulmonary hypertension.     Latest Ref Rng & Units 05/11/2018   10:13 AM  PFT Results  FVC-Pre L 2.46   FVC-Predicted Pre % 58   FVC-Post L 2.25   FVC-Predicted Post % 53  Pre FEV1/FVC % % 79   Post FEV1/FCV % % 81   FEV1-Pre L 1.94   FEV1-Predicted Pre % 59   FEV1-Post L 1.84   DLCO uncorrected ml/min/mmHg 19.38   DLCO UNC% % 57   DLCO corrected ml/min/mmHg 19.67   DLCO COR %Predicted % 58   DLVA Predicted % 105   TLC L 5.03   TLC % Predicted % 69   RV % Predicted % 99     HEMODYNAMICS: RA:                  8 mmHg (mean) RV:                  34/8 mmHg PA:                  62/25 mmHg (37 mean) PCWP:            18 mmHg (mean)                                      Estimated Fick CO/CI   4.5 L/min, 2  L/min/m2                                                 TPG                 19  mmHg                                            PVR                 4.2 Wood Units  PAPi                4.6      IMPRESSION: 1.Mildly elevated pre and post capillary filling pressures 2.Moderately elevated PVR and PA mean consistent with 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  3.Moderately reduced cardiac index.   ASSESSMENT & PLAN:  Heart failure with preserved EF Etiology of YQ:Dzczmz LVH on TTE; although, he has significant HTN, degree of LVH is concerning for infiltrative cardiomyopathy. CMR consistent with hypertensive heart disease. Myeloma panel previously negative; however, no FLC assay.  Echo 01/2024 EF 50-55%. RV normal.  NYHA class / AHA Stage: NYHA IIb Volume status & Diuretics: Appears euvolemic. Contineu torsemide  20 mg daily.  Vasodilators:Losartan  100mg  daily; continue amlodipine  10 mg dai,y  Beta-Blocker: continue coreg  3.125mg  BID MRA: continue fineronone 10mg  daily Cardiometabolic:Jardiance  10mg  Devices therapies & Valvulopathies:CMR consistent with hypertensive heart disease.  Advanced therapies:Not indicated.  Check BMET   2. Pulmonary HTN - Likely group 3; on chronic O2; severe OHS/OSA. He will likely benefit mostly from O2 therapy. Continue home oxygen   - V/Q scan pending - Serologic work up is negative: negative ANA - Does not have a heavy smoking history.   - CTA PE from 08/21/22 without interstitial lung disease.  - HRCT from 11/24 with no evidence of fibrotic lung disease.   3. Evaluation for Cardiac amyloid - PYP scan is grade 1 - Negative myeloma panel (no M spike etc) PYP reviewed by Dr Gardenia negative for amyloid.  LVH is  likely secondary to uncontrolled hypertension.  CMR not consistent with amyloid.   4. HTN Stable today. continue amlodipine  10mg  daily.  - losartan  100mg  daily  5. OSA - untreated, sleep study in 2015 showed severe OSA w/ AHI  41.5  - discussed association between untreated OSA and CV disease/ HTN - Continue CPAP.    6. Obesity  - Body mass index is 33.11 kg/m. He continues to watch portions.    7. CKD IIIb  - b/l Scr previously  ~1.4 - continue fineronone 10mg  daily   8. HLD - on atorvastatin  20  - followed by cardiology    9. HIV - now followed by ID, Dr. Dennise  - continue Biktarvy     10. Chronic hypoxemic respiratory failure  - followed by pulmonology; last seen on 11/03/23, reviewed notes.  - currently on 2LPM; sats stable.   11. ED Can use sildenafil as needed. Prescribed today.   Check BMET today   Follow up in 3-4 months and then can go back to general cardiology.   Ezzard Ditmer NP-C  8:35 AM

## 2024-08-01 ENCOUNTER — Ambulatory Visit

## 2024-08-01 ENCOUNTER — Telehealth (HOSPITAL_COMMUNITY): Payer: Self-pay

## 2024-08-01 ENCOUNTER — Other Ambulatory Visit: Payer: Self-pay

## 2024-08-01 DIAGNOSIS — I5032 Chronic diastolic (congestive) heart failure: Secondary | ICD-10-CM

## 2024-08-01 MED ORDER — LOSARTAN POTASSIUM 100 MG PO TABS: 100.0000 mg | ORAL_TABLET | Freq: Every day | ORAL | 2 refills | Status: AC

## 2024-08-01 NOTE — Telephone Encounter (Signed)
 Refill sent to pharmacy.

## 2024-08-09 ENCOUNTER — Ambulatory Visit: Admitting: Internal Medicine

## 2024-08-09 ENCOUNTER — Other Ambulatory Visit: Payer: Self-pay

## 2024-08-09 ENCOUNTER — Encounter: Payer: Self-pay | Admitting: Internal Medicine

## 2024-08-09 DIAGNOSIS — B20 Human immunodeficiency virus [HIV] disease: Secondary | ICD-10-CM

## 2024-08-09 MED ORDER — BIKTARVY 50-200-25 MG PO TABS: 1.0000 | ORAL_TABLET | Freq: Every day | ORAL | 11 refills | Status: AC

## 2024-08-09 NOTE — Progress Notes (Signed)
 [facility]     [name] is a 63 y.o.  male presents for HIV management.Dx HIV on 12/23.  Hx He was hospitalized in December for HF exacerbation found to e HIV+ 12/26 cd4 574, VL 15.1K. Today: doing well .  No missed doses of art.  ART exposure none Past OIs Risk factors: heterosexual Social: Occupation: Chief Technology Officer: apt by himself Support: has family, none know about HIV diagnosis Understanding of HIV: poor Etoh/drug/tobacco use: no/no/n Today :: Discussed the use of AI scribe software for clinical note transcription with the patient, who gave verbal consent to proceed.     The patient, with a history of HIV, heart disease, and lung disease, presents for routine follow-up. He reports adherence to his current antiretroviral therapy, Biktarvy , with no missed doses. He denies any recent changes in housing or employment. The patient has noticed the doctor's recent weight loss and inquires about his diet and exercise regimen.   The patient's HIV has been well controlled, with a nondetectable viral load at the last visit. The patient prefers to continue with his current oral medication.   The patient also has a history of heart and lung disease and is under the care of specialists for these conditions. He has not seen a nephrologist, despite a history of chronic kidney disease (CKD) and recent labs showing decreased kidney function.   The patient is up to date on his vaccinations, including the flu and COVID-19 vaccines. He also completed a home colon cancer screening test earlier this year, which was positive.      Past Medical History:  Diagnosis Date   Abnormal lung function test    a. Reported to possibly be COPD, but per pulm note: Possible COPD - PFT was more suggestive of restrictive defect and diffusion defect likely from obesity and CHF   CHF (congestive heart failure) (HCC)    Chronic diastolic heart failure (HCC) 05/07/2018    CKD (chronic kidney disease), stage III (HCC)    Diabetes mellitus type 2 in obese    Elevated troponin 05/07/2018   Hearing impaired    Hyperlipidemia    Hypertension    LBBB (left bundle branch block)    LVH (left ventricular hypertrophy)    Mild aortic stenosis    Mild pulmonary hypertension (HCC)    Morbid obesity (HCC)    Pulmonary hypertension (HCC)    Sleep apnea     Past Surgical History:  Procedure Laterality Date   BACK SURGERY     RIGHT HEART CATH N/A 11/06/2022   Procedure: RIGHT HEART CATH;  Surgeon: Gardenia Led, DO;  Location: [facility];  Service: Cardiovascular;  Laterality: N/A;   TRACHEOSTOMY      Family History  Problem Relation Age of Onset   Hypertension Mother    Hypertension Father    Current Outpatient Medications on File Prior to Visit  Medication Sig Dispense Refill   allopurinol  (ZYLOPRIM ) 100 MG tablet TAKE 1/2 TALBLET (50 MG) BY MOUTH EVERY OTHER DAY FOR GOUT PREVENTION 42 tablet 2   amLODipine  (NORVASC ) 10 MG tablet TAKE 1 TABLET BY MOUTH EVERY DAY 90 tablet 2   atorvastatin  (LIPITOR) 40 MG tablet Take 1 tablet (40 mg total) by mouth daily. 90 tablet 3   bictegravir-emtricitabine-tenofovir AF (BIKTARVY ) 50-200-25 MG TABS tablet Take 1 tablet by mouth daily. 30 tablet 11   carvedilol  (COREG ) 3.125 MG tablet Take 1 tablet (3.125 mg total) by  mouth 2 (two) times daily. 180 tablet 3   empagliflozin  (JARDIANCE ) 10 MG TABS tablet Take 1 tablet (10 mg total) by mouth daily. 90 tablet 3   Finerenone  (KERENDIA ) 10 MG TABS TAKE 1 TABLET BY MOUTH EVERY DAY 90 tablet 3   losartan  (COZAAR ) 100 MG tablet Take 1 tablet (100 mg total) by mouth daily. 90 tablet 2   sildenafil  (VIAGRA ) 50 MG tablet Take 1 tablet (50 mg total) by mouth daily as needed for erectile dysfunction. 10 tablet 2   torsemide  (DEMADEX ) 20 MG tablet Take 1 tablet (20 mg total) by mouth daily. 180 tablet 1   No current facility-administered medications on file prior to visit.     No Known Allergies    Lab Results HIV 1 RNA Quant  Date Value  02/08/2024 NOT DETECTED copies/mL  08/10/2023 Not Detected Copies/mL  11/09/2022 Not Detected Copies/mL   CD4 T Cell Abs (/uL)  Date Value  08/10/2023 863  11/09/2022 807  09/28/2022 552   No results found for: HIV1GENOSEQ Lab Results  Component Value Date   WBC 5.0 02/08/2024   HGB 11.6 (L) 02/08/2024   HCT 35.5 (L) 02/08/2024   MCV 96.2 02/08/2024   PLT 209 02/08/2024    Lab Results  Component Value Date   CREATININE 2.64 (H) 06/26/2024   BUN 30 (H) 06/26/2024   NA 143 06/26/2024   K 4.3 06/26/2024   CL 108 06/26/2024   CO2 26 06/26/2024   Lab Results  Component Value Date   ALT 11 02/08/2024   AST 11 02/08/2024   ALKPHOS 91 10/19/2022   BILITOT 0.4 02/08/2024    Lab Results  Component Value Date   CHOL 179 05/31/2024   TRIG 118 05/31/2024   HDL 49 05/31/2024   LDLCALC 109 (H) 05/31/2024   Lab Results  Component Value Date   HAV Reactive (A) 08/25/2022   Lab Results  Component Value Date   HEPBSAG NON REACTIVE 08/25/2022   HEPBSAB REACTIVE (A) 02/08/2024   No results found for: HCVAB Lab Results  Component Value Date   CHLAMYDIAWP Negative 09/09/2022   N Negative 09/09/2022   No results found for: GCPROBEAPT No results found for: QUANTGOLD  Assessment/Plan HIV VL ND on 02/08/24 -Continue biktarvy  -HIV labs, F/U in 6 month   Chronic Kidney Disease Kidney function has been consistently low. Sees D.r Jerrye, nephorlogy   #Cardiac amyloid #HFpEF Patient continues to follow with cardiology and pulmonology. -Continue current management with specialists.       #Vaccination COVID bososter UTD last week Flu utd(last week) Monkeypox PCV 20 04/13/22 Meningitis-needs HepA-immune  HEpB(sab nr, sag nr, ab Nr), needs hbv vaccine-1st dose 09/28/22, 2nd dose 02/09/23. immune Tdap unk Shingles needs   #Health maintenance -Quantiferon neagive 02/09/23 -RPR NR 08/26/22,  today 12/10 -HCV NR 08/23/22 today 12/10 -Oral  GC NR on 09/09/22 -Colonoscopy-cologaurd this year    Loney Stank, MD [facility] [place]  I personally spent a total of 42 minutes in the care of the patient today including preparing to see the patient, getting/reviewing separately obtained history, performing a medically appropriate exam/evaluation, counseling and educating, referring and communicating with other health care professionals, documenting clinical information in the EHR, and independently interpreting results.

## 2024-08-10 LAB — T-HELPER CELLS (CD4) COUNT (NOT AT ARMC)
CD4 % Helper T Cell: 72 % — ABNORMAL HIGH (ref 33–65)
CD4 T Cell Abs: 981 /uL (ref 400–1790)

## 2024-08-11 ENCOUNTER — Other Ambulatory Visit (HOSPITAL_COMMUNITY): Payer: Self-pay

## 2024-08-11 DIAGNOSIS — I5032 Chronic diastolic (congestive) heart failure: Secondary | ICD-10-CM

## 2024-08-11 LAB — COMPLETE METABOLIC PANEL WITHOUT GFR
AG Ratio: 1.6 (calc) (ref 1.0–2.5)
ALT: 14 U/L (ref 9–46)
AST: 11 U/L (ref 10–35)
Albumin: 4.3 g/dL (ref 3.6–5.1)
Alkaline phosphatase (APISO): 103 U/L (ref 35–144)
BUN/Creatinine Ratio: 11 (calc) (ref 6–22)
BUN: 27 mg/dL — ABNORMAL HIGH (ref 7–25)
CO2: 29 mmol/L (ref 20–32)
Calcium: 9.5 mg/dL (ref 8.6–10.3)
Chloride: 110 mmol/L (ref 98–110)
Creat: 2.46 mg/dL — ABNORMAL HIGH (ref 0.70–1.35)
Globulin: 2.7 g/dL (ref 1.9–3.7)
Glucose, Bld: 104 mg/dL — ABNORMAL HIGH (ref 65–99)
Potassium: 4.8 mmol/L (ref 3.5–5.3)
Sodium: 145 mmol/L (ref 135–146)
Total Bilirubin: 0.6 mg/dL (ref 0.2–1.2)
Total Protein: 7 g/dL (ref 6.1–8.1)

## 2024-08-11 LAB — CBC WITH DIFFERENTIAL/PLATELET
Absolute Lymphocytes: 1396 {cells}/uL (ref 850–3900)
Absolute Monocytes: 451 {cells}/uL (ref 200–950)
Basophils Absolute: 61 {cells}/uL (ref 0–200)
Basophils Relative: 1.3 %
Eosinophils Absolute: 141 {cells}/uL (ref 15–500)
Eosinophils Relative: 3 %
HCT: 37.6 % — ABNORMAL LOW (ref 39.4–51.1)
Hemoglobin: 12 g/dL — ABNORMAL LOW (ref 13.2–17.1)
MCH: 29.6 pg (ref 27.0–33.0)
MCHC: 31.9 g/dL (ref 31.6–35.4)
MCV: 92.6 fL (ref 81.4–101.7)
MPV: 11.7 fL (ref 7.5–12.5)
Monocytes Relative: 9.6 %
Neutro Abs: 2651 {cells}/uL (ref 1500–7800)
Neutrophils Relative %: 56.4 %
Platelets: 179 Thousand/uL (ref 140–400)
RBC: 4.06 Million/uL — ABNORMAL LOW (ref 4.20–5.80)
RDW: 13.7 % (ref 11.0–15.0)
Total Lymphocyte: 29.7 %
WBC: 4.7 Thousand/uL (ref 3.8–10.8)

## 2024-08-11 LAB — HIV-1 RNA QUANT-NO REFLEX-BLD
HIV 1 RNA Quant: NOT DETECTED {copies}/mL
HIV-1 RNA Quant, Log: NOT DETECTED {Log_copies}/mL

## 2024-08-11 LAB — HEPATITIS C ANTIBODY: Hepatitis C Ab: NONREACTIVE

## 2024-08-11 LAB — SYPHILIS: RPR W/REFLEX TO RPR TITER AND TREPONEMAL ANTIBODIES, TRADITIONAL SCREENING AND DIAGNOSIS ALGORITHM: RPR Ser Ql: NONREACTIVE

## 2024-08-11 MED ORDER — CARVEDILOL 3.125 MG PO TABS: 3.1250 mg | ORAL_TABLET | Freq: Two times a day (BID) | ORAL | 3 refills | Status: AC

## 2024-08-15 ENCOUNTER — Ambulatory Visit (HOSPITAL_COMMUNITY)
Admission: EM | Admit: 2024-08-15 | Discharge: 2024-08-15 | Disposition: A | Discharge status: Home / Self Care | Source: Home / Self Care | Attending: Family Medicine | Admitting: Family Medicine

## 2024-08-15 ENCOUNTER — Encounter (HOSPITAL_COMMUNITY): Payer: Self-pay

## 2024-08-15 ENCOUNTER — Ambulatory Visit (INDEPENDENT_AMBULATORY_CARE_PROVIDER_SITE_OTHER)

## 2024-08-15 DIAGNOSIS — M79671 Pain in right foot: Secondary | ICD-10-CM

## 2024-08-15 MED ORDER — PREDNISONE 20 MG PO TABS: 40.0000 mg | ORAL_TABLET | Freq: Every day | ORAL | 0 refills | Status: AC

## 2024-08-15 NOTE — Discharge Instructions (Signed)
 You were seen today for foot pain.  Your xray showed arthritis and an old fracture, but nothing new/concerning.  I have sent out a steroid to help with pain and inflammation.  You may continue tylenol  for pain as well.  Follow up as needed.

## 2024-08-15 NOTE — ED Provider Notes (Signed)
 [facility]    CSN: [id number] Arrival date & time: 08/15/24  0802      History   Chief Complaint Chief Complaint  Patient presents with   Foot Pain    HPI Mark [name] is a 63 y.o. male.    Foot Pain  Patient is here for right foot pain.  This started  last week Thursday.  No known injury.  No redness or swelling.  He states it feels like gout, but not sure.  He does have h/o gout, which is usually in his ankle, but pain is in the foot today.     Past Medical History:  Diagnosis Date   Abnormal lung function test    a. Reported to possibly be COPD, but per pulm note: Possible COPD - PFT was more suggestive of restrictive defect and diffusion defect likely from obesity and CHF   CHF (congestive heart failure) (HCC)    Chronic diastolic heart failure (HCC) 05/07/2018   CKD (chronic kidney disease), stage III (HCC)    Diabetes mellitus type 2 in obese    Elevated troponin 05/07/2018   Hearing impaired    Hyperlipidemia    Hypertension    LBBB (left bundle branch block)    LVH (left ventricular hypertrophy)    Mild aortic stenosis    Mild pulmonary hypertension (HCC)    Morbid obesity (HCC)    Pulmonary hypertension (HCC)    Sleep apnea     Patient Active Problem List   Diagnosis Date Noted   Class 1 obesity with body mass index (BMI) of 34.0 to 34.9 in adult 01/25/2024   Eye examination declined 01/25/2024   HIV disease (HCC) 09/27/2023   Herpes zoster vaccination declined 09/27/2023   COVID-19 vaccine administered 05/25/2023   Class 1 obesity due to excess calories without serious comorbidity with body mass index (BMI) of 33.0 to 33.9 in adult 05/25/2023   Chronic gout without tophus 03/31/2023   Hypertensive heart disease with chronic diastolic congestive heart failure (HCC) 01/12/2023   Pulmonary hypertension (HCC) 01/12/2023   Asymptomatic HIV infection, with no history of HIV-related illness (HCC) 01/12/2023   HFrEF (heart failure  with reduced ejection fraction) (HCC) 10/16/2022   Chronic respiratory failure with hypoxia (HCC) 10/16/2022   OSA (obstructive sleep apnea) 08/21/2022   Thyroid  nodule 01/15/2020   PAD (peripheral artery disease) 05/28/2019   Type 2 diabetes mellitus with chronic kidney disease (HCC) 09/26/2018   Obesity (BMI 30-39.9) 09/26/2018   Chronic diastolic heart failure (HCC) 05/07/2018   Cardiomegaly    Encntr for general adult medical exam w/o abnormal findings 04/25/2018   Hearing loss 04/25/2018   LBBB (left bundle branch block) 04/25/2018   Hyperlipidemia 04/25/2018   Other long term (current) drug therapy 04/25/2018   Other specified disorders of pigmentation 04/25/2018   Personal history of noncompliance with medical treatment, presenting hazards to health 04/25/2018   Sensorineural hearing loss (SNHL) of left ear 06/14/2017   Hypertensive heart disease 06/05/2013   Pure hypercholesterolemia 06/05/2013   Vitamin D deficiency 06/05/2013   Corns and callosity 11/03/2010    Past Surgical History:  Procedure Laterality Date   BACK SURGERY     RIGHT HEART CATH N/A 11/06/2022   Procedure: RIGHT HEART CATH;  Surgeon: Gardenia Led, DO;  Location: [facility];  Service: Cardiovascular;  Laterality: N/A;   TRACHEOSTOMY         Home Medications    Prior to Admission medications  Medication Sig Start  Date End Date Taking? Authorizing Provider  allopurinol  (ZYLOPRIM ) 100 MG tablet TAKE 1/2 TALBLET (50 MG) BY MOUTH EVERY OTHER DAY FOR GOUT PREVENTION 05/08/24   Petrina Pries, NP  amLODipine  (NORVASC ) 10 MG tablet TAKE 1 TABLET BY MOUTH EVERY DAY 11/19/23   Hayes Beckey CROME, NP  atorvastatin  (LIPITOR) 40 MG tablet Take 1 tablet (40 mg total) by mouth daily. 05/05/24   Sabharwal, Aditya, DO  bictegravir-emtricitabine-tenofovir AF (BIKTARVY ) 50-200-25 MG TABS tablet Take 1 tablet by mouth daily. 08/09/24   Dennise Kingsley, MD  carvedilol  (COREG ) 3.125 MG tablet Take 1 tablet (3.125 mg total)  by mouth 2 (two) times daily. 08/11/24   Bensimhon, Toribio SAUNDERS, MD  empagliflozin  (JARDIANCE ) 10 MG TABS tablet Take 1 tablet (10 mg total) by mouth daily. 04/05/24   Shlomo Wilbert SAUNDERS, MD  Finerenone  (KERENDIA ) 10 MG TABS TAKE 1 TABLET BY MOUTH EVERY DAY 01/14/24   Sabharwal, Aditya, DO  losartan  (COZAAR ) 100 MG tablet Take 1 tablet (100 mg total) by mouth daily. 08/01/24   Bensimhon, Toribio SAUNDERS, MD  sildenafil  (VIAGRA ) 50 MG tablet Take 1 tablet (50 mg total) by mouth daily as needed for erectile dysfunction. 06/26/24   Clegg, Amy D, NP  torsemide  (DEMADEX ) 20 MG tablet Take 1 tablet (20 mg total) by mouth daily. 11/22/23   Gardenia Led, DO    Family History Family History  Problem Relation Age of Onset   Hypertension Mother    Hypertension Father     Social History Social History[1]   Allergies   Patient has no known allergies.   Review of Systems Review of Systems  Constitutional: Negative.   HENT: Negative.    Respiratory: Negative.    Cardiovascular: Negative.   Gastrointestinal: Negative.      Physical Exam Triage Vital Signs ED Triage Vitals  Encounter Vitals Group     BP 08/15/24 0828 (!) 142/84     Girls Systolic BP Percentile --      Girls Diastolic BP Percentile --      Boys Systolic BP Percentile --      Boys Diastolic BP Percentile --      Pulse Rate 08/15/24 0828 60     Resp 08/15/24 0828 16     Temp 08/15/24 0828 97.8 F (36.6 C)     Temp Source 08/15/24 0828 Oral     SpO2 08/15/24 0828 97 %     Weight --      Height --      Head Circumference --      Peak Flow --      Pain Score 08/15/24 0829 9     Pain Loc --      Pain Education --      Exclude from Growth Chart --    No data found.  Updated Vital Signs BP (!) 142/84 (BP Location: Right Arm)   Pulse 60   Temp 97.8 F (36.6 C) (Oral)   Resp 16   SpO2 97%   Visual Acuity Right Eye Distance:   Left Eye Distance:   Bilateral Distance:    Right Eye Near:   Left Eye Near:    Bilateral  Near:     Physical Exam Constitutional:      General: He is not in acute distress.    Appearance: Normal appearance. He is normal weight. He is not ill-appearing or toxic-appearing.  Cardiovascular:     Rate and Rhythm: Normal rate and regular rhythm.  Pulmonary:  Effort: Pulmonary effort is normal.     Breath sounds: Normal breath sounds.  Musculoskeletal:     Comments: No obvious deformity to the right foot or ankle;  He has TTP to the foot, from the 2nd - 5th metatarsal;   no sores/lesions noted to the top or bottom of the foot  Neurological:     Mental Status: He is alert.      UC Treatments / Results  Labs (all labs ordered are listed, but only abnormal results are displayed) Labs Reviewed - No data to display  EKG   Radiology DG Foot Complete Right Result Date: 08/15/2024 EXAM: 3 OR MORE VIEW(S) XRAY OF THE RIGHT FOOT 08/15/2024 08:56:19 AM COMPARISON: Right foot series dated 12/04/2021. CLINICAL HISTORY: pain in lateral foot FINDINGS: BONES AND JOINTS: Status post arthroplasty of the first metatarsophalangeal joint. There is spurring present on the plantar surface of the calcaneus. There is a healed fracture of the fifth proximal phalanx again demonstrated. No acute fracture. No malalignment. SOFT TISSUES: Calcification is noted within the achilles tendon and the plantar aponeurosis. IMPRESSION: 1. No acute findings. 2. Status post arthroplasty of the first metatarsophalangeal joint. 3. Healed fracture of the fifth proximal phalanx. 4. Spurring on the plantar surface of the calcaneus. 5. Calcification within the Achilles tendon and the plantar aponeurosis. Electronically signed by: Evalene Coho MD 08/15/2024 09:02 AM EST RP Workstation: [id number]    Procedures Procedures (including critical care time)  Medications Ordered in UC Medications - No data to display  Initial Impression / Assessment and Plan / UC Course  I have reviewed the triage vital signs and the  nursing notes.  Pertinent labs & imaging results that were available during my care of the patient were reviewed by me and considered in my medical decision making (see chart for details).   Final Clinical Impressions(s) / UC Diagnoses   Final diagnoses:  Right foot pain     Discharge Instructions      You were seen today for foot pain.  Your xray showed arthritis and an old fracture, but nothing new/concerning.  I have sent out a steroid to help with pain and inflammation.  You may continue tylenol  for pain as well.  Follow up as needed.     ED Prescriptions     Medication Sig Dispense Auth. Provider   predniSONE  (DELTASONE ) 20 MG tablet Take 2 tablets (40 mg total) by mouth daily for 5 days. 10 tablet Darral Longs, MD      PDMP not reviewed this encounter.      [1]  Social History Tobacco Use   Smoking status: Former    Current packs/day: 0.00    Types: Cigarettes    Start date: 1998    Quit date: 1999    Years since quitting: 26.9   Smokeless tobacco: Never   Tobacco comments:    1 pack would last one month--08/01/18  Vaping Use   Vaping status: Never Used  Substance Use Topics   Alcohol use: No   Drug use: No     Darral Longs, MD 08/15/24 [phone]

## 2024-08-15 NOTE — ED Triage Notes (Signed)
 Pt has c/o right foot pain that started on Thursday. Pt has taken tylenol  last night with little relief. Denies swelling in foot, but has pain when walking.

## 2024-09-06 ENCOUNTER — Encounter

## 2024-09-07 LAB — LAB REPORT - SCANNED: EGFR: 24

## 2024-09-14 ENCOUNTER — Other Ambulatory Visit (HOSPITAL_COMMUNITY): Payer: Self-pay

## 2024-09-14 ENCOUNTER — Other Ambulatory Visit: Payer: Self-pay | Admitting: Nurse Practitioner

## 2024-09-14 MED ORDER — AMLODIPINE BESYLATE 10 MG PO TABS: 10.0000 mg | ORAL_TABLET | Freq: Every day | ORAL | 2 refills | Status: AC

## 2024-09-14 NOTE — Telephone Encounter (Signed)
 Copied from CRM [id number]. Topic: Clinical - Medication Refill >> Sep 14, 2024  2:51 PM Hadassah PARAS wrote: Medication: amLODipine  (NORVASC ) 10 MG tablet   Has the patient contacted their pharmacy? No (Agent: If no, request that the patient contact the pharmacy for the refill. If patient does not wish to contact the pharmacy document the reason why and proceed with request.) (Agent: If yes, when and what did the pharmacy advise?)  This is the patient's preferred pharmacy:  [id number] [place], [place] - [address] [place] [address] Phone: [phone] Fax: [phone]  Is this the correct pharmacy for this prescription? Yes If no, delete pharmacy and type the correct one.   Has the prescription been filled recently? No  Is the patient out of the medication? No  Has the patient been seen for an appointment in the last year OR does the patient have an upcoming appointment? Yes  Can we respond through MyChart? Yes  Agent: Please be advised that Rx refills may take up to 3 business days. We ask that you follow-up with your pharmacy.

## 2024-09-18 ENCOUNTER — Other Ambulatory Visit (HOSPITAL_COMMUNITY): Payer: Self-pay

## 2024-09-18 ENCOUNTER — Telehealth: Payer: Self-pay

## 2024-09-18 NOTE — Telephone Encounter (Signed)
[facility] Patient Advocate Encounter   I was successful in securing patient a $5,000.00 grant from [employer] [place] (PAF) to provide copayment coverage for Biktarvy.  This will make the out of pocket cost $0.00.     I have spoken with the patient.    The billing information is as follows and has been shared with [facility].            Patient knows to call the office with questions or concerns.  Ileene Patrick, [facility] Patient [facility] Phone: [phone] Fax:  [phone]

## 2024-10-02 ENCOUNTER — Ambulatory Visit: Payer: Self-pay | Admitting: Nurse Practitioner

## 2024-10-02 ENCOUNTER — Telehealth (HOSPITAL_COMMUNITY): Payer: Self-pay

## 2024-10-02 ENCOUNTER — Encounter: Payer: Self-pay | Admitting: Nurse Practitioner

## 2024-10-02 DIAGNOSIS — G4733 Obstructive sleep apnea (adult) (pediatric): Secondary | ICD-10-CM

## 2024-10-02 DIAGNOSIS — E782 Mixed hyperlipidemia: Secondary | ICD-10-CM

## 2024-10-02 DIAGNOSIS — B20 Human immunodeficiency virus [HIV] disease: Secondary | ICD-10-CM

## 2024-10-02 DIAGNOSIS — Z9981 Dependence on supplemental oxygen: Secondary | ICD-10-CM | POA: Insufficient documentation

## 2024-10-02 DIAGNOSIS — Z1211 Encounter for screening for malignant neoplasm of colon: Secondary | ICD-10-CM

## 2024-10-02 DIAGNOSIS — I110 Hypertensive heart disease with heart failure: Secondary | ICD-10-CM

## 2024-10-02 DIAGNOSIS — J9611 Chronic respiratory failure with hypoxia: Secondary | ICD-10-CM

## 2024-10-02 DIAGNOSIS — E1122 Type 2 diabetes mellitus with diabetic chronic kidney disease: Secondary | ICD-10-CM

## 2024-10-02 NOTE — Assessment & Plan Note (Signed)
 Blood sugar levels not monitored with finger sticks. Last A1c 5.9% in October - Ensure labs drawn within the next week to recheck blood sugar levels.

## 2024-10-02 NOTE — Assessment & Plan Note (Signed)
 Continue with oxygen  around the clock.

## 2024-10-02 NOTE — Assessment & Plan Note (Signed)
 Cholesterol levels are stable, continue statin. Tolerating well.

## 2024-10-02 NOTE — Assessment & Plan Note (Signed)
 He is encouraged to strive for BMI less than 30 to decrease cardiac risk. Advised to aim for at least 150 minutes of exercise per week.

## 2024-10-02 NOTE — Assessment & Plan Note (Signed)
 Uses CPAP machine nightly. Discussed importance of using CPAP during travel to prevent complications. - Recommended taking CPAP machine during travel.

## 2024-10-02 NOTE — Assessment & Plan Note (Signed)
 Continue f/u with Infectious Disease

## 2024-10-03 ENCOUNTER — Other Ambulatory Visit: Payer: Self-pay

## 2024-10-25 ENCOUNTER — Ambulatory Visit (HOSPITAL_COMMUNITY): Admitting: Cardiology

## 2024-11-07 ENCOUNTER — Ambulatory Visit: Admitting: Pulmonary Disease

## 2025-01-29 ENCOUNTER — Other Ambulatory Visit

## 2025-02-01 ENCOUNTER — Ambulatory Visit: Payer: Self-pay | Admitting: Nurse Practitioner

## 2025-02-12 ENCOUNTER — Ambulatory Visit: Payer: Self-pay | Admitting: Internal Medicine

## 2025-07-18 ENCOUNTER — Ambulatory Visit: Payer: Self-pay
